# Patient Record
Sex: Female | Born: 1965 | Race: Black or African American | Hispanic: No | Smoking: Never smoker
Health system: Southern US, Community
[De-identification: ages and names within clinical notes are randomized; demographics above are authoritative.]

## PROBLEM LIST (undated history)

## (undated) DIAGNOSIS — D573 Sickle-cell trait: Secondary | ICD-10-CM

## (undated) DIAGNOSIS — E669 Obesity, unspecified: Secondary | ICD-10-CM

## (undated) DIAGNOSIS — M5416 Radiculopathy, lumbar region: Secondary | ICD-10-CM

## (undated) DIAGNOSIS — M1990 Unspecified osteoarthritis, unspecified site: Secondary | ICD-10-CM

## (undated) DIAGNOSIS — Z803 Family history of malignant neoplasm of breast: Secondary | ICD-10-CM

## (undated) DIAGNOSIS — M961 Postlaminectomy syndrome, not elsewhere classified: Secondary | ICD-10-CM

## (undated) DIAGNOSIS — Z8041 Family history of malignant neoplasm of ovary: Secondary | ICD-10-CM

## (undated) DIAGNOSIS — E119 Type 2 diabetes mellitus without complications: Secondary | ICD-10-CM

## (undated) DIAGNOSIS — Z800 Family history of malignant neoplasm of digestive organs: Secondary | ICD-10-CM

## (undated) DIAGNOSIS — K219 Gastro-esophageal reflux disease without esophagitis: Secondary | ICD-10-CM

## (undated) DIAGNOSIS — G894 Chronic pain syndrome: Secondary | ICD-10-CM

## (undated) DIAGNOSIS — Z973 Presence of spectacles and contact lenses: Secondary | ICD-10-CM

## (undated) DIAGNOSIS — F411 Generalized anxiety disorder: Secondary | ICD-10-CM

## (undated) DIAGNOSIS — I10 Essential (primary) hypertension: Secondary | ICD-10-CM

## (undated) DIAGNOSIS — M542 Cervicalgia: Secondary | ICD-10-CM

## (undated) HISTORY — PX: UMBILICAL HERNIA REPAIR: SHX196

## (undated) HISTORY — DX: Chronic pain syndrome: G89.4

## (undated) HISTORY — DX: Postlaminectomy syndrome, not elsewhere classified: M96.1

## (undated) HISTORY — DX: Cervicalgia: M54.2

## (undated) HISTORY — DX: Generalized anxiety disorder: F41.1

## (undated) HISTORY — DX: Type 2 diabetes mellitus without complications: E11.9

## (undated) HISTORY — PX: HERNIA REPAIR: SHX51

## (undated) HISTORY — DX: Radiculopathy, lumbar region: M54.16

## (undated) HISTORY — DX: Family history of malignant neoplasm of digestive organs: Z80.0

## (undated) HISTORY — DX: Family history of malignant neoplasm of ovary: Z80.41

## (undated) HISTORY — PX: DILATION AND CURETTAGE OF UTERUS: SHX78

## (undated) HISTORY — PX: SHOULDER ARTHROSCOPY W/ ROTATOR CUFF REPAIR: SHX2400

## (undated) HISTORY — DX: Family history of malignant neoplasm of breast: Z80.3

---
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
## 1989-06-09 HISTORY — PX: CHOLECYSTECTOMY: SHX55

## 1989-07-10 HISTORY — PX: TUBAL LIGATION: SHX77

## 1997-10-27 ENCOUNTER — Ambulatory Visit (HOSPITAL_COMMUNITY): Admission: RE | Admit: 1997-10-27 | Discharge: 1997-10-27 | Payer: Self-pay | Admitting: Internal Medicine

## 1998-06-13 ENCOUNTER — Emergency Department (HOSPITAL_COMMUNITY): Admission: EM | Admit: 1998-06-13 | Discharge: 1998-06-13 | Payer: Self-pay | Admitting: Internal Medicine

## 1998-09-01 ENCOUNTER — Emergency Department (HOSPITAL_COMMUNITY): Admission: EM | Admit: 1998-09-01 | Discharge: 1998-09-01 | Payer: Self-pay | Admitting: Emergency Medicine

## 1998-12-15 ENCOUNTER — Encounter: Payer: Self-pay | Admitting: Internal Medicine

## 1998-12-15 ENCOUNTER — Ambulatory Visit (HOSPITAL_COMMUNITY): Admission: RE | Admit: 1998-12-15 | Discharge: 1998-12-15 | Payer: Self-pay | Admitting: Internal Medicine

## 1999-02-14 ENCOUNTER — Emergency Department (HOSPITAL_COMMUNITY): Admission: EM | Admit: 1999-02-14 | Discharge: 1999-02-14 | Payer: Self-pay | Admitting: Internal Medicine

## 1999-02-24 ENCOUNTER — Encounter: Admission: RE | Admit: 1999-02-24 | Discharge: 1999-05-25 | Payer: Self-pay | Admitting: Family Medicine

## 1999-05-25 ENCOUNTER — Emergency Department (HOSPITAL_COMMUNITY): Admission: EM | Admit: 1999-05-25 | Discharge: 1999-05-25 | Payer: Self-pay | Admitting: *Deleted

## 1999-06-23 ENCOUNTER — Emergency Department (HOSPITAL_COMMUNITY): Admission: EM | Admit: 1999-06-23 | Discharge: 1999-06-23 | Payer: Self-pay | Admitting: Podiatry

## 1999-06-27 ENCOUNTER — Encounter: Admission: RE | Admit: 1999-06-27 | Discharge: 1999-06-27 | Payer: Self-pay | Admitting: Orthopedic Surgery

## 1999-06-27 ENCOUNTER — Encounter: Payer: Self-pay | Admitting: Orthopedic Surgery

## 1999-08-01 ENCOUNTER — Encounter: Payer: Self-pay | Admitting: Emergency Medicine

## 1999-08-01 ENCOUNTER — Emergency Department (HOSPITAL_COMMUNITY): Admission: EM | Admit: 1999-08-01 | Discharge: 1999-08-01 | Payer: Self-pay | Admitting: Emergency Medicine

## 1999-08-04 ENCOUNTER — Emergency Department (HOSPITAL_COMMUNITY): Admission: EM | Admit: 1999-08-04 | Discharge: 1999-08-04 | Payer: Self-pay | Admitting: Emergency Medicine

## 1999-11-11 ENCOUNTER — Encounter: Admission: RE | Admit: 1999-11-11 | Discharge: 1999-12-23 | Payer: Self-pay | Admitting: Orthopedic Surgery

## 1999-12-12 ENCOUNTER — Ambulatory Visit (HOSPITAL_BASED_OUTPATIENT_CLINIC_OR_DEPARTMENT_OTHER): Admission: RE | Admit: 1999-12-12 | Discharge: 1999-12-12 | Payer: Self-pay | Admitting: General Surgery

## 1999-12-12 ENCOUNTER — Encounter (INDEPENDENT_AMBULATORY_CARE_PROVIDER_SITE_OTHER): Payer: Self-pay | Admitting: *Deleted

## 2000-01-27 ENCOUNTER — Ambulatory Visit (HOSPITAL_COMMUNITY): Admission: RE | Admit: 2000-01-27 | Discharge: 2000-01-27 | Payer: Self-pay | Admitting: *Deleted

## 2000-07-23 ENCOUNTER — Emergency Department (HOSPITAL_COMMUNITY): Admission: EM | Admit: 2000-07-23 | Discharge: 2000-07-23 | Payer: Self-pay | Admitting: Emergency Medicine

## 2000-12-19 ENCOUNTER — Emergency Department (HOSPITAL_COMMUNITY): Admission: EM | Admit: 2000-12-19 | Discharge: 2000-12-19 | Payer: Self-pay

## 2001-12-04 ENCOUNTER — Emergency Department (HOSPITAL_COMMUNITY): Admission: EM | Admit: 2001-12-04 | Discharge: 2001-12-04 | Payer: Self-pay

## 2001-12-04 ENCOUNTER — Encounter: Payer: Self-pay | Admitting: Emergency Medicine

## 2001-12-31 ENCOUNTER — Other Ambulatory Visit: Admission: RE | Admit: 2001-12-31 | Discharge: 2001-12-31 | Payer: Self-pay | Admitting: Family Medicine

## 2002-02-17 ENCOUNTER — Encounter (HOSPITAL_BASED_OUTPATIENT_CLINIC_OR_DEPARTMENT_OTHER): Payer: Self-pay | Admitting: General Surgery

## 2002-02-20 ENCOUNTER — Ambulatory Visit (HOSPITAL_COMMUNITY): Admission: RE | Admit: 2002-02-20 | Discharge: 2002-02-20 | Payer: Self-pay | Admitting: General Surgery

## 2002-11-25 ENCOUNTER — Encounter: Payer: Self-pay | Admitting: Emergency Medicine

## 2002-11-25 ENCOUNTER — Emergency Department (HOSPITAL_COMMUNITY): Admission: EM | Admit: 2002-11-25 | Discharge: 2002-11-25 | Payer: Self-pay | Admitting: Emergency Medicine

## 2003-07-08 ENCOUNTER — Ambulatory Visit (HOSPITAL_COMMUNITY): Admission: RE | Admit: 2003-07-08 | Discharge: 2003-07-08 | Payer: Self-pay | Admitting: Internal Medicine

## 2003-08-11 HISTORY — PX: KNEE ARTHROSCOPY: SHX127

## 2003-08-18 ENCOUNTER — Ambulatory Visit (HOSPITAL_BASED_OUTPATIENT_CLINIC_OR_DEPARTMENT_OTHER): Admission: RE | Admit: 2003-08-18 | Discharge: 2003-08-18 | Payer: Self-pay | Admitting: Orthopaedic Surgery

## 2003-12-07 ENCOUNTER — Emergency Department (HOSPITAL_COMMUNITY): Admission: EM | Admit: 2003-12-07 | Discharge: 2003-12-08 | Payer: Self-pay | Admitting: Emergency Medicine

## 2003-12-09 ENCOUNTER — Emergency Department (HOSPITAL_COMMUNITY): Admission: EM | Admit: 2003-12-09 | Discharge: 2003-12-09 | Payer: Self-pay | Admitting: Family Medicine

## 2003-12-10 ENCOUNTER — Emergency Department (HOSPITAL_COMMUNITY): Admission: EM | Admit: 2003-12-10 | Discharge: 2003-12-10 | Payer: Self-pay | Admitting: Emergency Medicine

## 2004-03-02 ENCOUNTER — Emergency Department (HOSPITAL_COMMUNITY): Admission: EM | Admit: 2004-03-02 | Discharge: 2004-03-02 | Payer: Self-pay | Admitting: Emergency Medicine

## 2004-03-02 ENCOUNTER — Emergency Department (HOSPITAL_COMMUNITY): Admission: EM | Admit: 2004-03-02 | Discharge: 2004-03-02 | Payer: Self-pay | Admitting: Family Medicine

## 2005-02-23 ENCOUNTER — Emergency Department (HOSPITAL_COMMUNITY): Admission: EM | Admit: 2005-02-23 | Discharge: 2005-02-23 | Payer: Self-pay | Admitting: *Deleted

## 2005-03-27 ENCOUNTER — Emergency Department (HOSPITAL_COMMUNITY): Admission: EM | Admit: 2005-03-27 | Discharge: 2005-03-27 | Payer: Self-pay | Admitting: Emergency Medicine

## 2005-03-29 ENCOUNTER — Emergency Department (HOSPITAL_COMMUNITY): Admission: EM | Admit: 2005-03-29 | Discharge: 2005-03-29 | Payer: Self-pay | Admitting: Emergency Medicine

## 2005-04-24 ENCOUNTER — Emergency Department (HOSPITAL_COMMUNITY): Admission: EM | Admit: 2005-04-24 | Discharge: 2005-04-24 | Payer: Self-pay | Admitting: Emergency Medicine

## 2005-04-26 ENCOUNTER — Emergency Department (HOSPITAL_COMMUNITY): Admission: EM | Admit: 2005-04-26 | Discharge: 2005-04-26 | Payer: Self-pay | Admitting: Family Medicine

## 2005-04-27 ENCOUNTER — Ambulatory Visit (HOSPITAL_COMMUNITY): Admission: RE | Admit: 2005-04-27 | Discharge: 2005-04-27 | Payer: Self-pay | Admitting: Family Medicine

## 2005-12-05 ENCOUNTER — Emergency Department (HOSPITAL_COMMUNITY): Admission: EM | Admit: 2005-12-05 | Discharge: 2005-12-05 | Payer: Self-pay | Admitting: Family Medicine

## 2006-03-14 ENCOUNTER — Emergency Department (HOSPITAL_COMMUNITY): Admission: EM | Admit: 2006-03-14 | Discharge: 2006-03-14 | Payer: Self-pay | Admitting: Emergency Medicine

## 2006-05-24 ENCOUNTER — Encounter: Payer: Self-pay | Admitting: Cardiology

## 2006-05-24 ENCOUNTER — Ambulatory Visit: Payer: Self-pay

## 2006-12-22 ENCOUNTER — Emergency Department (HOSPITAL_COMMUNITY): Admission: EM | Admit: 2006-12-22 | Discharge: 2006-12-22 | Payer: Self-pay | Admitting: Family Medicine

## 2007-03-04 ENCOUNTER — Emergency Department (HOSPITAL_COMMUNITY): Admission: EM | Admit: 2007-03-04 | Discharge: 2007-03-04 | Payer: Self-pay | Admitting: Emergency Medicine

## 2007-04-15 ENCOUNTER — Encounter: Admission: RE | Admit: 2007-04-15 | Discharge: 2007-05-22 | Payer: Self-pay | Admitting: Orthopedic Surgery

## 2007-12-19 ENCOUNTER — Emergency Department (HOSPITAL_COMMUNITY): Admission: EM | Admit: 2007-12-19 | Discharge: 2007-12-19 | Payer: Self-pay | Admitting: Emergency Medicine

## 2008-04-13 ENCOUNTER — Emergency Department (HOSPITAL_COMMUNITY): Admission: EM | Admit: 2008-04-13 | Discharge: 2008-04-14 | Payer: Self-pay | Admitting: Emergency Medicine

## 2008-04-25 ENCOUNTER — Emergency Department (HOSPITAL_COMMUNITY): Admission: EM | Admit: 2008-04-25 | Discharge: 2008-04-25 | Payer: Self-pay | Admitting: Emergency Medicine

## 2008-06-12 ENCOUNTER — Emergency Department (HOSPITAL_COMMUNITY): Admission: EM | Admit: 2008-06-12 | Discharge: 2008-06-12 | Payer: Self-pay | Admitting: Family Medicine

## 2008-12-02 ENCOUNTER — Emergency Department (HOSPITAL_COMMUNITY): Admission: EM | Admit: 2008-12-02 | Discharge: 2008-12-02 | Payer: Self-pay | Admitting: Family Medicine

## 2009-06-24 ENCOUNTER — Emergency Department (HOSPITAL_COMMUNITY): Admission: EM | Admit: 2009-06-24 | Discharge: 2009-06-24 | Payer: Self-pay | Admitting: Family Medicine

## 2009-08-11 ENCOUNTER — Emergency Department (HOSPITAL_COMMUNITY): Admission: EM | Admit: 2009-08-11 | Discharge: 2009-08-11 | Payer: Self-pay | Admitting: Emergency Medicine

## 2010-06-07 ENCOUNTER — Emergency Department (HOSPITAL_COMMUNITY): Admission: EM | Admit: 2010-06-07 | Discharge: 2010-06-07 | Payer: Self-pay | Admitting: Family Medicine

## 2010-07-30 ENCOUNTER — Encounter: Payer: Self-pay | Admitting: Internal Medicine

## 2010-08-15 ENCOUNTER — Ambulatory Visit (INDEPENDENT_AMBULATORY_CARE_PROVIDER_SITE_OTHER): Payer: Self-pay

## 2010-08-15 ENCOUNTER — Inpatient Hospital Stay (INDEPENDENT_AMBULATORY_CARE_PROVIDER_SITE_OTHER)
Admission: RE | Admit: 2010-08-15 | Discharge: 2010-08-15 | Disposition: A | Discharge status: Home / Self Care | Payer: Self-pay | Source: Ambulatory Visit | Attending: Family Medicine | Admitting: Family Medicine

## 2010-08-15 DIAGNOSIS — S93609A Unspecified sprain of unspecified foot, initial encounter: Secondary | ICD-10-CM

## 2010-10-10 ENCOUNTER — Inpatient Hospital Stay (INDEPENDENT_AMBULATORY_CARE_PROVIDER_SITE_OTHER)
Admission: RE | Admit: 2010-10-10 | Discharge: 2010-10-10 | Disposition: A | Discharge status: Home / Self Care | Payer: Self-pay | Source: Ambulatory Visit | Attending: Emergency Medicine | Admitting: Emergency Medicine

## 2010-10-10 DIAGNOSIS — J040 Acute laryngitis: Secondary | ICD-10-CM

## 2010-10-10 DIAGNOSIS — J309 Allergic rhinitis, unspecified: Secondary | ICD-10-CM

## 2010-10-10 LAB — POCT RAPID STREP A (OFFICE): Streptococcus, Group A Screen (Direct): NEGATIVE

## 2010-11-25 NOTE — Op Note (Signed)
NAME:  [name], [name]                          ACCOUNT NO.:  [id number]   MEDICAL RECORD NO.:  [id number]                   PATIENT TYPE:  AMB   LOCATION:  DSC                                  FACILITY:  [facility]   PHYSICIAN:  Claude Manges. Cleophas Dunker, M.D.            DATE OF BIRTH:  [birth year]   DATE OF PROCEDURE:  08/18/2003  DATE OF DISCHARGE:                                 OPERATIVE REPORT   PREOPERATIVE DIAGNOSES:  1. 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  2. Possible intercondylar notch loose bodies.   POSTOPERATIVE DIAGNOSES:  1. 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  2. Possible intercondylar notch loose bodies without evidence of loose     bodies.  3. Chondromalacia of lateral compartment.   PROCEDURES:  1. Arthroscopic debridement of patella and lateral compartment.  2. Arthroscopic lateral release.   SURGEON:  Claude Manges. Cleophas Dunker, M.D.   ANESTHESIA:  General endotracheal anesthesia.   COMPLICATIONS:  None.   BRIEF HISTORY:  The patient is a 45 year old overweight female who has been  followed in the medical clinic with a history of patella subluxation and  possibly dislocation occurring approximately four years ago.  She has had  some sensation of her patella wanting to give way since that time.  She  has had a lot of difficulty with pain on the lateral compartment of her knee  as well as the lateral aspect of the patella.  A recent MRI scan revealed  grade 4 chondromalacia of the patella with possible calcified loose bodies  posterior to the posterior cruciate ligament.  There were no other visible  loose bodies in the joint and the menisci were intact.  Clinically, she has  subluxation with lateral pain.  She has not had arthroscopic evaluation.   DESCRIPTION OF PROCEDURE:  With the patient comfortable on the operating  table and under  general orotracheal anesthesia the left lower extremity was  placed in a thigh holder.  The leg was then prepped with DuraPrep from the  thigh holder to the ankle.  The patient did have a knee block  preoperatively.  Diagnostic arthroscopy was performed using a medial and  lateral parapatellar tendon stab wound.  Diagnostic arthroscopy revealed no  evidence of loose bodies in the superior pouch, either gutter or either  compartment.  I carefully probed the ACL and PCL which appeared to be intact  and did not feel or could I extract any loose bodies.  Both medial and  lateral menisci were intact.  I did not see any appreciable chondromalacia  of the medial compartment, but there was diffuse chondromalacia of the  lateral compartment, particularly grade 2 changes and close to grade 3  changes of the tibial plateau and probably grade 1 changes of the femoral  condyle.   There were areas of complete articular cartilage loss along the lateral  patellar facet with a lateral patellar tilt and accordingly a lateral  release was performed.  A guide spinal needle was placed at the proximal  extent of the superior compartment and pouch.  The arthroscopic long shafted  Bovie was then inserted and I carefully released the lateral superior extent  of the capsule to the inferior aspect of the joint line.  Because of the  patient's weight, there was considerable adipose tissue.  I carefully used  the ArthriCare wand so that I could remove a portion of this and visualize  the joint capsule more efficiently.  I had a very nice release.  I did  insert the scissors just to be sure that I had released all the fibers and I  felt that I had elevated the patella laterally and it was a very nice  release.   Any small bleeders were coagulated with the Bovie.  The joint was nice and  dry with no evidence of any loose material.  The two stab wounds were then  infiltrated with 0.25% Marcaine with epinephrine.  A  sterile bulky dressing  was applied followed by an ace bandage.   The plan is for [facility] for pain and follow-up in the office in one  week.                                               Claude Manges. Cleophas Dunker, M.D.    PWW/MEDQ  D:  08/18/2003  T:  08/18/2003  Job:  [id number]

## 2010-11-25 NOTE — Op Note (Signed)
NAME:  [name], BACKHAUS                          ACCOUNT NO.:  [id number]   MEDICAL RECORD NO.:  [id number]                   PATIENT TYPE:  OIB   LOCATION:  2550                                 FACILITY:  [facility]   PHYSICIAN:  Luisa Hart L. Lurene Shadow, M.D.             DATE OF BIRTH:  1965/09/21   DATE OF PROCEDURE:  02/20/2002  DATE OF DISCHARGE:  02/20/2002                                 OPERATIVE REPORT   PREOPERATIVE DIAGNOSIS:  Recurrent ventral hernia.   POSTOPERATIVE DIAGNOSIS:  Recurrent ventral hernia.   PROCEDURE:  Repair of recurrent ventral hernia.   SURGEON:  Mardene Celeste. Lurene Shadow, M.D.   ASSISTANT:  Nurse   ANESTHESIA:  General.   NOTE:  The patient is a 45 year old woman who is status post repair of a  periumbilical ventral hernia following a laparoscopic cholecystectomy in the  remote past.  She returns now with a bulging mass lateral to the previous  incision and adjacent to the umbilicus.  This mass is reducible.  She comes  to the operating room now for repair after the risks and potential benefits  of surgery have been fully discussed, all questions answered, and she gives  consent.   PROCEDURE:  Following the induction of satisfactory general anesthesia with  the patient positioned supine, the abdomen was routinely prepped and draped  to be included in the sterile operative field.  We made an infraumbilical  incision in a semicircular fashion excising the previous scar cicatrix.  This was deepened through the skin and subcutaneous tissue carrying the  dissection down to the region of the hernia.  The hernia was cleared of all  its attachments and the hernia sac opened and the peritoneum entered.  I  palpated the remainder of the abdominal wall, there were no additional  herniae palpated.  There was a select defect lateral and inferior to this  area within the region of my long finger but I decided that extending the  incision to close this hernia at this time would  not be appropriate.  The  peritoneum was then closed with a running suture of 2-0 Vicryl.  Sponge,  instrument and needle counts were verified.  The hernia defect was then  closed with a polypropylene mesh patch sewn in around the defect with  interrupted sutures of 0 Novafil.  By the end of this, the hernia defect was  noted to be intact.  Sponge, instrument and needle counts were again  verified.  The subcutaneous tissue were closed with interrupted 2-0 Vicryl  suture and the skin was closed with running 4-0 Monocryl suture reinforced  with Steri-Strips.  Sterile dressings were applied, the anesthetic reversed,  and the patient removed from the operating room to the recovery room in  stable condition, having tolerated the procedure well.  Mardene Celeste Lurene Shadow, M.D.    PLB/MEDQ  D:  02/20/2002  T:  02/21/2002  Job:  [id number]

## 2010-11-25 NOTE — Procedures (Signed)
[place]. [facility]  Patient:    [name], [name]                       MRN: [id number] Proc. Date: 01/27/00 Adm. Date:  [id number] Disc. Date: [id number] Attending:  Sharyn Dross CC:         Geraldo Pitter, M.D.                           Procedure Report  PREOPERATIVE DIAGNOSIS:  Persistent nausea and vomiting.  POSTOPERATIVE DIAGNOSIS: 1.  Diffuse gastritis. 2.  Relaxed lower esophageal sphincter. 3.  Bilious mucus leak, possibly secondary to status post cholecystectomy with     reflux of bilious material.  PROCEDURE:  Esophagogastroduodenoscopy.  MEDICATIONS:  Demerol 50 mg IV, Versed 5 mg IV ove ten minute period of time.  INSTRUMENT:  Olympus video panendoscope.  ENDOSCOPIST:  Sharyn Dross., M.D.  INDICATIONS:  This pleasant 45 year-old female was referred for an evaluation because of persistent nausea and vomiting that is occurring.  The patient is presently taking medications, such as Phenergan, for the nausea which is ongoing.  She is presently not on any other medications at this point.  She has had previous surgeries which were a hiatal hernia repair which was in June of 2001 and a cholecystectomy in December of 1990.  She is presently stable bt was referred because of the persistent nausea and vomiting and discomfort that is present.  Objective findings is that she is a pleasant female, obese, in no acute distress.  Her vital signs are stable.  HEENT:  Anicteric.  Neck supple. Lungs clear.  Heart:  Regular rate and rhythm.  No murmurs, rubs, or gallops. Abdomen:  Soft.  No major tenderness to palpation.  No hepatosplenomegaly appreciated.  PLAN:  Proceed with endoscopic examination.  PROCEDURE IN DETAIL:  The patient was advised of the procedure, indications, nd risks involved.  The patient has agreed to have the procedure performed at this time.  A video was viewed and informed consent was obtained.  The patient was brought into  the endoscopy unit and IV sedative medications were started.  Monitors were placed on the patient to monitor the patients vital signs and oxygen saturation.  His nasal oxygen at 2 liters per minute was used and, after adequate sedation was performed, the procedure was begun.  The instrument was advanced with the patient lying in the left lateral position via direct visualization without difficulty.  The oropharynx, epiglottis, vocal cords, and piriform sinuses appeared to be grossly within normal limits.  The esophagus was normal without any evidence of acute inflammation, ulceration, hiatal hernia, or varices appreciated.  The distal portion of the esophagus showed evidence of some possibly mild irritation of the cardia region at this time but no gross abnormalities noted.  The gastric area showed evidence of a bilious mucus leak with evidence of diffuse gastritis that was present throughout the gastric body and antral area.  This inflammation appears to be slightly different from the normal characteristics as previously seen at this time.  Photographs were taken of this region which had a nutmeg appearance at this point.  The antral area showed evidence of inflammation that was noted in the pylorus.  It was normal at this time.  Upon advancing into the pylorus and duodenal bulb and second portion, these appeared to be within normal limits.  The instrument was retracted  back where retroflexion of the cardia revealed a relaxed lower esophageal sphincter, possibly secondary to the medications given.  The instrument was retracted back where visualization of the gastric diverticulum was noted.  There appeared to be no abnormalities within the diverticulum at this point. Photographs were taken of the gastric diverticulum that was noted.  The Z-line appeared to be approximately 37.5 cm distal to the esophagus and, as the instrument was retracted back into the esophageal region, a better  photograph of the borders of the area was taken.  Again, this was still consistent possibly with a mild inflammatory process that could be present at this time. The instrument was retracted back into the esophageal region where there was no evidence of any pulse endoscopic trauma to the mucosa.  The instrument was subsequently removed without difficulty with the patient tolerating the proceduregarding well.  TREATMENT:  I am going to start the patient with Reglan 10 mg q.12h in conjunction with the medication she is presently taking.  I am going to see, because of the increased bowel reflux that is present, could she had bowel gastritis which may be a contributing factor to her problems.  Will have her followup in the office in the next few weeks and depending upon her response will determine the course of therapy. DD:  01/27/00 TD:  01/30/00 Job: [id number]

## 2010-11-25 NOTE — Op Note (Signed)
[place]. [facility]  Patient:    [name], [name]                       MRN: [id number] Proc. Date: 12/12/99 Adm. Date:  [id number] Attending:  Fortino Sic                           Operative Report  PREOPERATIVE DIAGNOSIS:  Ventral hernia.  POSTOPERATIVE DIAGNOSIS:  Ventral hernia.  OPERATION PERFORMED:  Repair of ventral hernia.  SURGEON:  Marnee Spring. Wiliam Ke, M.D.  ASSISTANT:  None.  ANESTHESIA:  Endotracheal by hospital.  PROCEDURE:  Under good endotracheal anesthesia, skin of the abdomen was prepped and draped in usual manner.  The previously used subumbilical incision was opened and extended to the left.  The patient was quite obese, but we were able to get to the abdominal wall and find a hernia approximately one fingerbreadth across.  The hernia sac was excised.  A finger was placed inside the preperitoneal space and no other hernias were found by palpation. Hemostasis was good.  The hernia defect was closed with three musculofascial sutures of #1 Novofil.  The defect was then closed with subcutaneous 3-0 Vicryl and subcuticular 4-0 Dexon.  Steri-Strips were applied.  Estimated blood loss minimal.  The patient received no blood and left the operating room in satisfactory condition after sponge and needle counts were verified. DD:  12/12/99 TD:  12/14/99 Job: [id number]

## 2011-02-21 ENCOUNTER — Inpatient Hospital Stay (INDEPENDENT_AMBULATORY_CARE_PROVIDER_SITE_OTHER)
Admission: RE | Admit: 2011-02-21 | Discharge: 2011-02-21 | Disposition: A | Discharge status: Home / Self Care | Payer: Self-pay | Source: Ambulatory Visit | Attending: Emergency Medicine | Admitting: Emergency Medicine

## 2011-02-21 DIAGNOSIS — J45909 Unspecified asthma, uncomplicated: Secondary | ICD-10-CM

## 2011-02-21 DIAGNOSIS — J019 Acute sinusitis, unspecified: Secondary | ICD-10-CM

## 2011-03-30 ENCOUNTER — Inpatient Hospital Stay (INDEPENDENT_AMBULATORY_CARE_PROVIDER_SITE_OTHER)
Admission: RE | Admit: 2011-03-30 | Discharge: 2011-03-30 | Disposition: A | Discharge status: Home / Self Care | Payer: Self-pay | Source: Ambulatory Visit | Attending: Emergency Medicine | Admitting: Emergency Medicine

## 2011-03-30 DIAGNOSIS — M79609 Pain in unspecified limb: Secondary | ICD-10-CM

## 2011-03-30 DIAGNOSIS — M779 Enthesopathy, unspecified: Secondary | ICD-10-CM

## 2011-08-08 ENCOUNTER — Emergency Department (INDEPENDENT_AMBULATORY_CARE_PROVIDER_SITE_OTHER): Payer: Self-pay

## 2011-08-08 ENCOUNTER — Emergency Department (INDEPENDENT_AMBULATORY_CARE_PROVIDER_SITE_OTHER)
Admission: EM | Admit: 2011-08-08 | Discharge: 2011-08-08 | Disposition: A | Discharge status: Home / Self Care | Payer: Self-pay | Source: Home / Self Care | Attending: Family Medicine | Admitting: Family Medicine

## 2011-08-08 ENCOUNTER — Encounter (HOSPITAL_COMMUNITY): Payer: Self-pay | Admitting: Emergency Medicine

## 2011-08-08 DIAGNOSIS — S63509A Unspecified sprain of unspecified wrist, initial encounter: Secondary | ICD-10-CM

## 2011-08-08 DIAGNOSIS — S63501A Unspecified sprain of right wrist, initial encounter: Secondary | ICD-10-CM

## 2011-08-08 NOTE — ED Notes (Signed)
Reports falling on steps this am, reports "slick" spot on steps.  Patient tried to catch self, hyperextended right wrist/hand.  Able to move fingers and palpable radial pulse in right wrist.  Points to wrist as most painful area

## 2011-08-08 NOTE — ED Provider Notes (Signed)
History     CSN: [id number]  Arrival date & time 08/08/11  8657   First MD Initiated Contact with Patient 08/08/11 [phone]      Chief Complaint  Patient presents with  . Fall    (Consider location/radiation/quality/duration/timing/severity/associated sxs/prior treatment) Patient is a 46 y.o. female presenting with wrist pain. The history is provided by the patient.  Wrist Pain This is a new problem. The current episode started 1 to 2 hours ago (fell down stair and landed on right wrist). The problem occurs constantly. The problem has not changed since onset.   Past Medical History  Diagnosis Date  . Asthma     Past Surgical History  Procedure Date  . Tubal ligation   . Cholecystectomy   . Hernia repair   . Rotator cuff repair     History reviewed. No pertinent family history.  History  Substance Use Topics  . Smoking status: Never Smoker   . Smokeless tobacco: Not on file  . Alcohol Use: No    OB History    Grav Para Term Preterm Abortions TAB SAB Ect Mult Living                  Review of Systems  Constitutional: Negative.   Musculoskeletal: Positive for joint swelling. Negative for back pain and gait problem.  Neurological: Negative.     Allergies  Vicodin  Home Medications  No current outpatient prescriptions on file.  BP 139/94  Pulse 82  Temp(Src) 97.3 F (36.3 C) (Oral)  Resp 16  SpO2 96%  LMP 07/09/2011  Physical Exam  Nursing note and vitals reviewed. Constitutional: She is oriented to person, place, and time. She appears well-developed and well-nourished.  HENT:  Head: Normocephalic and atraumatic.  Musculoskeletal: She exhibits tenderness.       Right wrist: She exhibits decreased range of motion, tenderness, bony tenderness and swelling. She exhibits no effusion, no deformity and no laceration.       No other c/o injury except right wrist.  Neurological: She is alert and oriented to person, place, and time.    ED Course    Procedures (including critical care time)  Labs Reviewed - No data to display Dg Wrist Complete Right  08/08/2011  *RADIOLOGY REPORT*  Clinical Data:  Fall, hand pain  RIGHT WRIST - COMPLETE 3+ VIEW  Comparison: None.  Findings: No evidence of fracture distal radius or ulna. Radiocarpal joint is intact.  No evidence of carpal fracture.  IMPRESSION: No radiographic evidence of wrist fracture.  Original Report Authenticated By: Genevive Bi, M.D.     1. Sprain of right wrist       MDM  X-rays reviewed and report per radiologist.        Barkley Bruns, MD 08/08/11 1026

## 2011-08-15 ENCOUNTER — Encounter (HOSPITAL_COMMUNITY): Payer: Self-pay | Admitting: Emergency Medicine

## 2011-08-15 ENCOUNTER — Emergency Department (HOSPITAL_COMMUNITY)
Admission: EM | Admit: 2011-08-15 | Discharge: 2011-08-15 | Disposition: A | Discharge status: Home / Self Care | Payer: Self-pay | Attending: Emergency Medicine | Admitting: Emergency Medicine

## 2011-08-15 DIAGNOSIS — G43909 Migraine, unspecified, not intractable, without status migrainosus: Secondary | ICD-10-CM | POA: Insufficient documentation

## 2011-08-15 MED ORDER — DEXAMETHASONE SODIUM PHOSPHATE 10 MG/ML IJ SOLN
10.0000 mg | Freq: Once | INTRAMUSCULAR | Status: AC
  Administered 2011-08-15: 10 mg via INTRAMUSCULAR
  Filled 2011-08-15: qty 1

## 2011-08-15 MED ORDER — PROMETHAZINE HCL 25 MG/ML IJ SOLN
25.0000 mg | Freq: Four times a day (QID) | INTRAMUSCULAR | Status: DC | PRN
  Administered 2011-08-15: 25 mg via INTRAMUSCULAR
  Filled 2011-08-15: qty 1

## 2011-08-15 MED ORDER — KETOROLAC TROMETHAMINE 60 MG/2ML IM SOLN
INTRAMUSCULAR | Status: AC
  Filled 2011-08-15: qty 2

## 2011-08-15 MED ORDER — KETOROLAC TROMETHAMINE 30 MG/ML IJ SOLN
60.0000 mg | Freq: Once | INTRAMUSCULAR | Status: AC
  Administered 2011-08-15: 60 mg via INTRAMUSCULAR
  Filled 2011-08-15: qty 1

## 2011-08-15 MED ORDER — DIPHENHYDRAMINE HCL 50 MG/ML IJ SOLN
25.0000 mg | Freq: Once | INTRAMUSCULAR | Status: AC
  Administered 2011-08-15: 16:00:00 via INTRAMUSCULAR
  Filled 2011-08-15: qty 1

## 2011-08-15 NOTE — ED Provider Notes (Signed)
History     CSN: [id number]  Arrival date & time 08/15/11  1505   First MD Initiated Contact with Patient 08/15/11 1511      Chief Complaint  Patient presents with  . Migraine    (Consider location/radiation/quality/duration/timing/severity/associated sxs/prior treatment) HPI Comments: Patient reports she is having symptoms of her typical migraine that began yesterday morning.  Reports headache across forehead that is throbbing with sharp pains over her left parietal region.  Has associated blurry vision and nausea.  States there is no change in her chronic migraine symptoms.  Has taken Excedrin migraine without relief.  Patient also began developing upper respiratory symptoms yesterday with nasal congestion and sinus pressure.  Last headache this bad was 3 years ago.  Denies fevers, stiff neck, "worst" headache of her life, sudden onset of headache, focal neurological deficits.  Previously treated at pain clinic with toradol for chronic migraines but no longer goes.    Patient is a 46 y.o. female presenting with migraine. The history is provided by the patient.  Migraine Associated symptoms include headaches. Pertinent negatives include no chest pain, fever, neck pain, numbness or sore throat.    Past Medical History  Diagnosis Date  . Asthma     Past Surgical History  Procedure Date  . Tubal ligation   . Cholecystectomy   . Hernia repair   . Rotator cuff repair     No family history on file.  History  Substance Use Topics  . Smoking status: Never Smoker   . Smokeless tobacco: Not on file  . Alcohol Use: No    OB History    Grav Para Term Preterm Abortions TAB SAB Ect Mult Living                  Review of Systems  Constitutional: Negative for fever.  HENT: Negative for sore throat, neck pain and neck stiffness.   Respiratory: Negative for shortness of breath.   Cardiovascular: Negative for chest pain.  Neurological: Positive for headaches. Negative for  dizziness and numbness.  All other systems reviewed and are negative.    Allergies  Vicodin  Home Medications  No current outpatient prescriptions on file.  BP 142/93  Pulse 80  Temp(Src) 98 F (36.7 C) (Oral)  Resp 14  Ht 5' 6.5" (1.689 m)  Wt 302 lb (136.986 kg)  BMI 48.01 kg/m2  SpO2 99%  LMP 08/14/2010  Physical Exam  Nursing note and vitals reviewed. Constitutional: She is oriented to person, place, and time. She appears well-developed and well-nourished.  HENT:  Head: Normocephalic and atraumatic.  Neck: Neck supple.  Cardiovascular: Normal rate, regular rhythm and normal heart sounds.   Pulmonary/Chest: Breath sounds normal. No stridor. No respiratory distress. She has no wheezes. She has no rales. She exhibits no tenderness.  Abdominal: Soft. Bowel sounds are normal. There is no tenderness.  Neurological: She is alert and oriented to person, place, and time. She has normal strength. No cranial nerve deficit or sensory deficit. She exhibits normal muscle tone. GCS eye subscore is 4. GCS verbal subscore is 5. GCS motor subscore is 6.       CN III-XII intact, EOMs intact, grip strengths equal, strength 5/5 in all extremities, sensation intact.      ED Course  Procedures (including critical care time)  Labs Reviewed - No data to display No results found.   1. Migraine       MDM  Nontoxic patient with hx migraines reports 2 days  of chronic migraine symptoms unrelieved with home medications.  No concerning symptoms suggesting that this is not a simple migraine.  Treated in ED with IM medications.  D/C home with headache [facility] center and PCP follow up.          Dillard [place], [place] 08/15/11 1910

## 2011-08-15 NOTE — ED Notes (Signed)
Pt states woke up with migraine yesterday am, has used Excederin migraine this am at 11 without relief. Denies vomiting but has had bouts of nausea. Pt states last migraine prior to this one was 1 year. Pt states does not have a neuro to see.

## 2011-08-15 NOTE — ED Provider Notes (Signed)
Medical screening examination/treatment/procedure(s) were performed by non-physician practitioner and as supervising physician I was immediately available for consultation/collaboration.  Daily Doe P Eriel Doyon, MD 08/15/11 2343 

## 2011-09-01 ENCOUNTER — Emergency Department (HOSPITAL_COMMUNITY)
Admission: EM | Admit: 2011-09-01 | Discharge: 2011-09-01 | Disposition: A | Discharge status: Home / Self Care | Payer: Self-pay | Attending: Emergency Medicine | Admitting: Emergency Medicine

## 2011-09-01 ENCOUNTER — Encounter (HOSPITAL_COMMUNITY): Payer: Self-pay

## 2011-09-01 DIAGNOSIS — G43909 Migraine, unspecified, not intractable, without status migrainosus: Secondary | ICD-10-CM | POA: Insufficient documentation

## 2011-09-01 DIAGNOSIS — J45909 Unspecified asthma, uncomplicated: Secondary | ICD-10-CM | POA: Insufficient documentation

## 2011-09-01 MED ORDER — DIPHENHYDRAMINE HCL 50 MG/ML IJ SOLN
25.0000 mg | Freq: Once | INTRAMUSCULAR | Status: AC
  Administered 2011-09-01: 25 mg via INTRAVENOUS
  Filled 2011-09-01: qty 1

## 2011-09-01 MED ORDER — DEXAMETHASONE SODIUM PHOSPHATE 10 MG/ML IJ SOLN
10.0000 mg | Freq: Once | INTRAMUSCULAR | Status: AC
  Administered 2011-09-01: 10 mg via INTRAVENOUS
  Filled 2011-09-01: qty 1

## 2011-09-01 MED ORDER — KETOROLAC TROMETHAMINE 30 MG/ML IJ SOLN
30.0000 mg | Freq: Once | INTRAMUSCULAR | Status: AC
  Administered 2011-09-01: 30 mg via INTRAMUSCULAR
  Filled 2011-09-01: qty 1

## 2011-09-01 NOTE — ED Provider Notes (Signed)
History     CSN: [id number]  Arrival date & time 08/15/11  1505   First MD Initiated Contact with Patient 08/15/11 1511      Chief Complaint  Patient presents with  . Migraine    (Consider location/radiation/quality/duration/timing/severity/associated sxs/prior treatment) HPI   Patient reports she is having symptoms of her typical migraine that began yesterday Wednesday. Reports headache across forehead that is throbbing with sharp pains over her left parietal region. Has associated blurry vision and nausea. States there is no change in her chronic migraine symptoms. Has taken Excedrin migraine without relief. Patient also began developing upper respiratory symptoms yesterday with nasal congestion and sinus pressure. Last headache this bad was 3 years ago. Denies fevers, stiff neck, "worst" headache of her life, sudden onset of headache, focal neurological deficits. Previously treated at pain clinic with toradol for chronic migraines but no longer goes.  The patient admits to being seen here on Feb [facility] at [facility] and given toradol, decadron and benadryl which helped. She was not given any meds for home. SHe was given a referral to the [facility] which she calls and they will see her Monday. She states she can't wait that long. 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   Past Medical History  Diagnosis Date  . Asthma   . Migraine     Past Surgical History  Procedure Date  . Tubal ligation   . Cholecystectomy   . Hernia repair   . Rotator cuff repair     No family history on file.  History  Substance Use Topics  . Smoking status: Never Smoker   . Smokeless tobacco: Not on file  . Alcohol Use: No    OB History    Grav Para Term Preterm Abortions TAB SAB Ect Mult Living                  Review of Systems  All other systems reviewed and are negative.    Allergies    Vicodin  Home Medications   Current Outpatient Rx  Name Route Sig Dispense Refill  . ASPIRIN-ACETAMINOPHEN-CAFFEINE 250-250-65 MG PO TABS Oral Take 1 tablet by mouth every 6 (six) hours as needed. For migraine      BP 149/89  Pulse 84  Temp(Src) 98 F (36.7 C) (Oral)  Resp 14  Ht 5' 6.5" (1.689 m)  Wt 302 lb (136.986 kg)  BMI 48.01 kg/m2  SpO2 100%  LMP 08/14/2010  Physical Exam  Constitutional: She is oriented to person, place, and time. She appears well-developed and well-nourished.  HENT:  Head: Normocephalic and atraumatic.  Eyes: Conjunctivae and EOM are normal. Pupils are equal, round, and reactive to light.  Neck: Trachea normal, normal range of motion and full passive range of motion without pain. Neck supple.  Cardiovascular: Normal rate, regular rhythm and normal pulses.   Pulmonary/Chest: Effort normal. Chest wall is not dull to percussion. She exhibits no crepitus, no edema, no deformity and no retraction.  Abdominal: Normal appearance.  Musculoskeletal: Normal range of motion.  Neurological: She is oriented to person, place, and time. She has normal strength. No cranial nerve deficit. Coordination normal. GCS eye subscore is 4. GCS verbal subscore is 5. GCS motor subscore is 6.  Skin: Skin is warm, dry and intact.  Psychiatric: Her speech is normal. Cognition and memory are normal.    ED  Course  Procedures (including critical care time)  Labs Reviewed - No data to display No results found.   1. Migraine       MDM  Pt given Toradol, Decadron and Benadryl IM in  ED. NO Rx for home, she can use her Excendrin. Pt to follow-up with Headache clinic on Monday.         Dorthula Matas, PA 09/02/11 [phone]

## 2011-09-01 NOTE — ED Notes (Signed)
No neurologist however has referral to headache clinic.  Use to see Adeleman for her headache

## 2011-09-01 NOTE — ED Provider Notes (Signed)
History     CSN: [id number]  Arrival date & time 09/01/11  1507   First MD Initiated Contact with Patient 09/01/11 1536      No chief complaint on file.   (Consider location/radiation/quality/duration/timing/severity/associated sxs/prior treatment) Patient is a 46 y.o. female presenting with migraine.  Migraine This is a recurrent problem.   Patient reports she is having symptoms of her typical migraine that began Wednesday. Reports headache across forehead that is throbbing with sharp pains over her left parietal region. Has associated blurry vision and nausea. States there is no change in her chronic migraine symptoms. Has taken Excedrin migraine without relief. Denies fevers, stiff neck, "worst" headache of her life, sudden onset of headache, focal neurological deficits. Previously treated at pain clinic with toradol for chronic migraines but no longer goes.  Pt admits to being seen on Aug 15, 2011 for migraine headache which was treated in the ED. PT given referral to [facility] but can not be seen until Monday. Pt states at work they were spraying something, she had angry clients and two tests which caused her mirgraine to return.   Past Medical History  Diagnosis Date  . Asthma     Past Surgical History  Procedure Date  . Tubal ligation   . Cholecystectomy   . Hernia repair   . Rotator cuff repair     No family history on file.  History  Substance Use Topics  . Smoking status: Never Smoker   . Smokeless tobacco: Not on file  . Alcohol Use: No    OB History    Grav Para Term Preterm Abortions TAB SAB Ect Mult Living                  Review of Systems  All other systems reviewed and are negative.    Allergies  Vicodin  Home Medications   Current Outpatient Rx  Name Route Sig Dispense Refill  . ASPIRIN-ACETAMINOPHEN-CAFFEINE 250-250-65 MG PO TABS Oral Take 1 tablet by mouth every 6 (six) hours as needed. For migraine      LMP 08/14/2010  Physical Exam   Constitutional: She is oriented to person, place, and time. She appears well-developed and well-nourished.  HENT:  Head: Normocephalic and atraumatic.  Eyes: Conjunctivae are normal. Pupils are equal, round, and reactive to light.  Neck: Trachea normal, normal range of motion and full passive range of motion without pain. Neck supple.  Cardiovascular: Normal rate, regular rhythm and normal pulses.   Pulmonary/Chest: Effort normal. Chest wall is not dull to percussion. She exhibits no crepitus, no edema, no deformity and no retraction.  Abdominal: Normal appearance.  Musculoskeletal: Normal range of motion.  Neurological: She is oriented to person, place, and time. She has normal strength. No cranial nerve deficit. Coordination normal. GCS eye subscore is 4. GCS verbal subscore is 5. GCS motor subscore is 6.  Skin: Skin is warm, dry and intact.  Psychiatric: Her speech is normal. Cognition and memory are normal.    ED Course  Procedures (including critical care time)  Labs Reviewed - No data to display No results found.   1. Migraine       MDM  Pt given IM benadryl, Toradol and Decadron in ED. Pt to see [facility] on Monday. She can continue to use Excedrin at home for pain. Return to ED precautions given        Dorthula Matas, [place] 09/01/11 1633

## 2011-09-01 NOTE — ED Notes (Signed)
Waiting on ride.

## 2011-09-01 NOTE — Discharge Instructions (Signed)
Migraine Headache A migraine headache is an intense, throbbing pain on one or both sides of your head. The exact cause of a migraine headache is not always known. A migraine may be caused when nerves in the brain become irritated and release chemicals that cause swelling within blood vessels, causing pain. Many migraine sufferers have a family history of migraines. Before you get a migraine you may or may not get an aura. An aura is a group of symptoms that can predict the beginning of a migraine. An aura may include:  Visual changes such as:   Flashing lights.   Bright spots or zig-zag lines.   Tunnel vision.   Feelings of numbness.   Trouble talking.   Muscle weakness.  SYMPTOMS  Pain on one or both sides of your head.   Pain that is pulsating or throbbing in nature.   Pain that is severe enough to prevent daily activities.   Pain that is aggravated by any daily physical activity.   Nausea (feeling sick to your stomach), vomiting, or both.   Pain with exposure to bright lights, loud noises, or activity.   General sensitivity to bright lights or loud noises.  MIGRAINE TRIGGERS Examples of triggers of migraine headaches include:   Alcohol.   Smoking.   Stress.   It may be related to menses (female menstruation).   Aged cheeses.   Foods or drinks that contain nitrates, glutamate, aspartame, or tyramine.   Lack of sleep.   Chocolate.   Caffeine.   Hunger.   Medications such as nitroglycerine (used to treat chest pain), birth control pills, estrogen, and some blood pressure medications.  DIAGNOSIS  A migraine headache is often diagnosed based on:  Symptoms.   Physical examination.   A computerized X-ray scan (computed tomography, CT) of your head.  TREATMENT  Medications can help prevent migraines if they are recurrent or should they become recurrent. Your caregiver can help you with a medication or treatment program that will be helpful to you.   Lying  down in a dark, quiet room may be helpful.   Keeping a headache diary may help you find a trend as to what may be triggering your headaches.  SEEK IMMEDIATE MEDICAL CARE IF:   You have confusion, personality changes or seizures.   You have headaches that wake you from sleep.   You have an increased frequency in your headaches.   You have a stiff neck.   You have a loss of vision.   You have muscle weakness.   You start losing your balance or have trouble walking.   You feel faint or pass out.  MAKE SURE YOU:   Understand these instructions.   Will watch your condition.   Will get help right away if you are not doing well or get worse.  Document Released: 06/26/2005 Document Revised: 03/08/2011 Document Reviewed: 02/09/2009 [facility] Patient Information 2012 [place], [place].  RESOURCE GUIDE  Dental Problems  Patients with Medicaid: [facility] [phone] [address]                                           [phone] [address] Phone:  [phone]  Phone:  [phone]  If unable to pay or uninsured, contact:  [facility] or [facility]. to become qualified for the adult dental clinic.  Chronic Pain Problems Contact [facility]  [phone] Patients need to be referred by their primary care doctor.  Insufficient Money for Medicine Contact [employer] Way:  call "211" or [facility] 8591741712.  No Primary Care Doctor Call [facility]  [phone] Other agencies that provide inexpensive medical care    [facility]  [phone]    [facility]  [phone]    [facility]  [phone]    [facility]  [phone]    [facility]  [phone]    [facility]  [phone]  Psychological Services [facility] Behavioral Health  [phone] [facility] Services  [phone] [facility] Mental [facility]   [phone]  (emergency services [phone])  Substance Abuse Resources [facility]  [phone] [facility] [phone] [facility] [place] [phone] Floydene Flock [phone] Residential & Outpatient Substance Abuse Program  [phone]  Abuse/Neglect [facility] Child Abuse Hotline 807 602 2013 [facility] Child Abuse Hotline (571)835-7950 (After Hours)  Emergency Shelter [facility] Ministries 773-427-4127  Maternity Homes [facility] [phone] [facility] Alert Services [phone]  MRSA Hotline #:   7624084730    [facility] [place] Resources  [facility] [place] [facility]     [employer]                          [facility] Dept. [address] [place]                       [address]      [address]  Blondell Reveal Phone:  [phone]                                   Phone:  [phone]                 Phone:  [phone]  [facility] Mental Health Phone:  [phone]  [facility] Child Abuse Hotline [phone] (After Hours)

## 2011-09-01 NOTE — ED Notes (Signed)
Typical migraine since Wednesday- excedrin migraine with no relief.  Seen a few weeks ago and was given shot "great relief"- Pt has a ride

## 2011-09-02 NOTE — ED Provider Notes (Signed)
Medical screening examination/treatment/procedure(s) were performed by non-physician practitioner and as supervising physician I was immediately available for consultation/collaboration.  Vershawn Westrup T Giana Castner, MD 09/02/11 2027 

## 2011-09-02 NOTE — ED Provider Notes (Signed)
Medical screening examination/treatment/procedure(s) were performed by non-physician practitioner and as supervising physician I was immediately available for consultation/collaboration.  Zaevion Parke T Hamda Klutts, MD 09/02/11 2027 

## 2011-09-13 ENCOUNTER — Emergency Department (HOSPITAL_COMMUNITY)
Admission: EM | Admit: 2011-09-13 | Discharge: 2011-09-13 | Disposition: A | Discharge status: Home / Self Care | Payer: Self-pay | Attending: Emergency Medicine | Admitting: Emergency Medicine

## 2011-09-13 ENCOUNTER — Encounter (HOSPITAL_COMMUNITY): Payer: Self-pay | Admitting: Emergency Medicine

## 2011-09-13 DIAGNOSIS — G43909 Migraine, unspecified, not intractable, without status migrainosus: Secondary | ICD-10-CM | POA: Insufficient documentation

## 2011-09-13 MED ORDER — KETOROLAC TROMETHAMINE 30 MG/ML IJ SOLN
30.0000 mg | Freq: Once | INTRAMUSCULAR | Status: AC
  Administered 2011-09-13: 30 mg via INTRAVENOUS
  Filled 2011-09-13: qty 1

## 2011-09-13 MED ORDER — METOCLOPRAMIDE HCL 10 MG PO TABS: 10.0000 mg | ORAL_TABLET | Freq: Four times a day (QID) | ORAL | Status: DC | PRN

## 2011-09-13 MED ORDER — KETOROLAC TROMETHAMINE 10 MG PO TABS: 10.0000 mg | ORAL_TABLET | Freq: Four times a day (QID) | ORAL | Status: AC | PRN

## 2011-09-13 MED ORDER — METOCLOPRAMIDE HCL 5 MG/ML IJ SOLN
10.0000 mg | Freq: Once | INTRAMUSCULAR | Status: AC
  Administered 2011-09-13: 10 mg via INTRAVENOUS
  Filled 2011-09-13: qty 2

## 2011-09-13 MED ORDER — DEXAMETHASONE SODIUM PHOSPHATE 10 MG/ML IJ SOLN
10.0000 mg | Freq: Once | INTRAMUSCULAR | Status: AC
  Administered 2011-09-13: 10 mg via INTRAVENOUS
  Filled 2011-09-13: qty 1

## 2011-09-13 MED ORDER — SODIUM CHLORIDE 0.9 % IV BOLUS (SEPSIS)
1000.0000 mL | Freq: Once | INTRAVENOUS | Status: AC
  Administered 2011-09-13: 1000 mL via INTRAVENOUS

## 2011-09-13 NOTE — Discharge Instructions (Signed)
As we discussed, do not use more than 2 doses of toradol at home in 1 day and do not take for more than 2 days at a time. You can use the reglan for nausea associated with your migraines. Return to the ER if your headache is different from your normal or is associated with visual change, fever, neck stiffness, rash, or trouble walking or speaking.     Recurrent Migraine Headache You have a recurrent migraine headache. The caregiver can usually provide good relief for this headache. If this headache is the same as your previous migraine headaches, it is safe to treat you without repeating a complete evaluation.  These headaches usually have at least two of the following problems:   They occur on one side of the head, pulsate, and are severe enough to prevent daily activities.   They are aggravated by daily physical activities.  You may have one or more of the following symptoms:   Nausea (feeling sick to your stomach).   Vomiting.   Pain with exposure to bright lights or loud noises.  Most headache sufferers have a family history of migraines. Your headaches may also be related to alcohol and smoking habits. Too much sleep, too little sleep, mood, and anxiety may also play a part. Changing some of these triggers may help you lower the number and level of pain of the headaches. Headaches may be related to menses (female menstruation). There are numerous medications that can prevent these headaches. Your caregiver can help you with a medication or regimen (procedure to follow). If this has been a chronic (long-term) condition, the use of long-term narcotics is not recommended. Using long-term narcotics can cause recurrent migraines. Narcotics are only a temporary measure only. They are used for the infrequent migraine that fails to respond to all other measures. SEEK MEDICAL CARE IF:   You do not get relief from the medications given to you.   You have a recurrence of pain.   This headache  begins to differ from past migraine (for example if it is more severe).  SEEK IMMEDIATE MEDICAL CARE IF:  You have a fever.   You have a stiff neck.   You have vision loss or have changes in vision.   You have problems with feeling lightheaded, become faint, or lose your balance.   You have muscular weakness.   You have loss of muscular control.   You develop severe symptoms different from your first symptoms.   You start losing your balance or have trouble walking.   You feel faint or pass out.  MAKE SURE YOU:   Understand these instructions.   Will watch your condition.   Will get help right away if you are not doing well or get worse.  Document Released: 03/21/2001 Document Revised: 06/15/2011 Document Reviewed: 02/13/2008 [facility] Patient Information 2012 [place], [place].

## 2011-09-13 NOTE — ED Provider Notes (Signed)
History     CSN: [id number]  Arrival date & time 09/13/11  1409   First MD Initiated Contact with Patient 09/13/11 1431      Chief Complaint  Patient presents with  . Migraine    (Consider location/radiation/quality/duration/timing/severity/associated sxs/prior treatment) The history is provided by the patient.  Pt with a hx of migraine HA presents to ED with c/o migraine. 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 Feels just like her prior migraines, all over, non-radiating, severe. There is assoc nausea, vomiting, and photophobia. There has been no assoc fever, chills, neck pain or stiffness, visual change, syncope, head injury, weakness, or rash. Not worst HA of life. Pt reports [facility], but her insurance does not go into effect until 4/1 and they wanted $800 to see her without insurance. Has tried excedrin migraine without relief.   Past Medical History  Diagnosis Date  . Asthma   . Migraine     Past Surgical History  Procedure Date  . Tubal ligation   . Cholecystectomy   . Hernia repair   . Rotator cuff repair     History reviewed. No pertinent family history.  History  Substance Use Topics  . Smoking status: Never Smoker   . Smokeless tobacco: Not on file  . Alcohol Use: No     Review of Systems 10 systems reviewed and are negative for acute change except as noted in the HPI.  Allergies  Vicodin  Home Medications   Current Outpatient Rx  Name Route Sig Dispense Refill  . ASPIRIN-ACETAMINOPHEN-CAFFEINE 250-250-65 MG PO TABS Oral Take 2 tablets by mouth every 6 (six) hours as needed. For migraine      BP 153/91  Pulse 96  Temp(Src) 98.7 F (37.1 C) (Oral)  Resp 18  SpO2 96%  LMP 08/14/2010  Physical Exam  Nursing note and vitals reviewed. Constitutional: She is oriented to person, place, and time. She  appears well-developed and well-nourished.       Lying in a dark room, uncomfortable appearing  HENT:  Head: Normocephalic and atraumatic.  Right Ear: External ear normal.  Left Ear: External ear normal.  Mouth/Throat: Oropharynx is clear and moist.  Eyes: Conjunctivae and EOM are normal. Pupils are equal, round, and reactive to light.       No nystagmus  Neck: Normal range of motion. Neck supple.  Cardiovascular: Normal rate and regular rhythm.   Pulmonary/Chest: Breath sounds normal. No respiratory distress. She has no wheezes.  Abdominal: Soft. Bowel sounds are normal. She exhibits no distension. There is no tenderness.  Musculoskeletal: She exhibits no edema and no tenderness.  Neurological: She is alert and oriented to person, place, and time. No cranial nerve deficit.       F-N intact bilaterally. Normal gait without ataxia.  Skin: Skin is warm and dry. No rash noted.  Psychiatric: She has a normal mood and affect.    ED Course  Procedures (including critical care time)  Labs Reviewed - No data to display No results found.   Dx 1: Migraine HA   MDM  Migraine HA with hx of same. No head injury to suggest SDH. No worst HA of life, sudden onset, or neuro deficit to suggest SAH. No fever, neck pain, rash to suggest meningitis. Pt reports toradol has worked in past- will give toradol, reglan, prednisone, IV  NS bolus.    5:19 PM Pt reports sig improvement in symptoms. She requests rx for home use to prevent recurrent ER visits. I have discussed with her the dangers of toradol use for longer than 2 days at a time, she voices understanding and agrees to take no more than 1-2 doses at home. Will also be given nausea medicine for use if migraine recurs. Advised return to ED for eval if HA different from normal, worst of life, or assoc with warning signs of more serious pathology. Pt voices understanding. WIll f[facility] with [facility] with insurance goes into  effect.        Shaaron Adler, PA-C 09/13/11 1721

## 2011-09-13 NOTE — Progress Notes (Signed)
Pt listed as self pay with no insurance coverage Pt confirms she is self pay [place] resident.  CM  and [facility] coordinator spoke with her Pt offered [facility] services to assist with finding a [place]  self pay provider 

## 2011-09-14 NOTE — ED Provider Notes (Signed)
Medical screening examination/treatment/procedure(s) were conducted as a shared visit with non-physician practitioner(s) and myself.  I personally evaluated the patient during the encounter Pt w 'migraine', gradual onset, same as prior migraines. A/o x 3. Motor intact. No neck stiffness. Will medicate for pain.   Suzi Roots, MD 09/14/11 1540

## 2011-09-16 ENCOUNTER — Emergency Department (HOSPITAL_COMMUNITY)
Admission: EM | Admit: 2011-09-16 | Discharge: 2011-09-16 | Disposition: A | Discharge status: Home / Self Care | Payer: Self-pay | Attending: Emergency Medicine | Admitting: Emergency Medicine

## 2011-09-16 ENCOUNTER — Emergency Department (HOSPITAL_COMMUNITY): Payer: Self-pay

## 2011-09-16 ENCOUNTER — Encounter (HOSPITAL_COMMUNITY): Payer: Self-pay | Admitting: *Deleted

## 2011-09-16 DIAGNOSIS — R42 Dizziness and giddiness: Secondary | ICD-10-CM | POA: Insufficient documentation

## 2011-09-16 DIAGNOSIS — H53459 Other localized visual field defect, unspecified eye: Secondary | ICD-10-CM | POA: Insufficient documentation

## 2011-09-16 DIAGNOSIS — G43909 Migraine, unspecified, not intractable, without status migrainosus: Secondary | ICD-10-CM | POA: Insufficient documentation

## 2011-09-16 DIAGNOSIS — R110 Nausea: Secondary | ICD-10-CM | POA: Insufficient documentation

## 2011-09-16 HISTORY — DX: Essential (primary) hypertension: I10

## 2011-09-16 LAB — POCT I-STAT, CHEM 8
BUN: 10 mg/dL (ref 6–23)
Calcium, Ion: 1.21 mmol/L (ref 1.12–1.32)
Chloride: 102 mEq/L (ref 96–112)
Creatinine, Ser: 0.5 mg/dL (ref 0.50–1.10)
Glucose, Bld: 118 mg/dL — ABNORMAL HIGH (ref 70–99)
HCT: 35 % — ABNORMAL LOW (ref 36.0–46.0)
Hemoglobin: 11.9 g/dL — ABNORMAL LOW (ref 12.0–15.0)
Potassium: 3.7 mEq/L (ref 3.5–5.1)
Sodium: 140 mEq/L (ref 135–145)
TCO2: 26 mmol/L (ref 0–100)

## 2011-09-16 MED ORDER — PROMETHAZINE HCL 25 MG/ML IJ SOLN
25.0000 mg | Freq: Once | INTRAMUSCULAR | Status: AC
  Administered 2011-09-16: 25 mg via INTRAMUSCULAR
  Filled 2011-09-16: qty 1

## 2011-09-16 MED ORDER — DIPHENHYDRAMINE HCL 50 MG/ML IJ SOLN
25.0000 mg | Freq: Once | INTRAMUSCULAR | Status: AC
  Administered 2011-09-16: 25 mg via INTRAMUSCULAR
  Filled 2011-09-16: qty 1

## 2011-09-16 MED ORDER — PREDNISONE 50 MG PO TABS
50.0000 mg | ORAL_TABLET | Freq: Every day | ORAL | Status: DC
  Filled 2011-09-16: qty 1

## 2011-09-16 MED ORDER — KETOROLAC TROMETHAMINE 30 MG/ML IJ SOLN
30.0000 mg | Freq: Once | INTRAMUSCULAR | Status: AC
  Administered 2011-09-16: 30 mg via INTRAMUSCULAR
  Filled 2011-09-16: qty 1

## 2011-09-16 NOTE — ED Provider Notes (Signed)
History     CSN: [id number]  Arrival date & time 09/16/11  1356   First MD Initiated Contact with Patient 09/16/11 1709      Chief Complaint  Patient presents with  . Migraine  . Dizziness  . Spots and/or Floaters    "lights flashing"  . Emesis   "migraines" for past month.  Seen on Feb 5 and Feb 22.  And then again this past Wednesday.   Was dc'd home On Toradol and Antiemetic.   R Temporal.     (Consider location/radiation/quality/duration/timing/severity/associated sxs/prior treatment) Patient is a 46 y.o. female presenting with migraine and vomiting. The history is provided by the patient.  Migraine This is a recurrent problem. The current episode started more than 2 days ago. The problem occurs constantly. Pertinent negatives include no chest pain, no abdominal pain and no shortness of breath. The symptoms are aggravated by stress. The symptoms are relieved by rest, sleep and NSAIDs.  Emesis  Pertinent negatives include no abdominal pain.   Usually improves with dark room.  States "they wont let me wear sunglasses at work."   Photophobia.  Phonophobia. Nausea.  Vomited 1x this am. This am dizzy and lightheaded.  Sees "little flickers" of light.    Past Medical History  Diagnosis Date  . Asthma   . Migraine   . Hypertension     Past Surgical History  Procedure Date  . Tubal ligation   . Cholecystectomy   . Hernia repair   . Rotator cuff repair     History reviewed. No pertinent family history.  History  Substance Use Topics  . Smoking status: Never Smoker   . Smokeless tobacco: Never Used  . Alcohol Use: No    OB History    Grav Para Term Preterm Abortions TAB SAB Ect Mult Living                  Review of Systems  Respiratory: Negative for shortness of breath.   Cardiovascular: Negative for chest pain.  Gastrointestinal: Positive for vomiting. Negative for abdominal pain.  All other systems reviewed and are negative.    Allergies   Vicodin  Home Medications   Current Outpatient Rx  Name Route Sig Dispense Refill  . ASPIRIN-ACETAMINOPHEN-CAFFEINE 250-250-65 MG PO TABS Oral Take 2 tablets by mouth every 6 (six) hours as needed. For migraine    . KETOROLAC TROMETHAMINE 10 MG PO TABS Oral Take 1 tablet (10 mg total) by mouth every 6 (six) hours as needed for pain. 10 tablet 0  . METOCLOPRAMIDE HCL 10 MG PO TABS Oral Take 1 tablet (10 mg total) by mouth every 6 (six) hours as needed (for nausea and/or vomiting). 10 tablet 0    BP 128/69  Pulse 86  Temp(Src) 98.2 F (36.8 C) (Oral)  Resp 17  Ht 5\' 7"  (1.702 m)  Wt 300 lb (136.079 kg)  BMI 46.99 kg/m2  SpO2 99%  LMP 09/09/2011  Physical Exam  Nursing note and vitals reviewed. Constitutional: She is oriented to person, place, and time. She appears well-developed and well-nourished.  HENT:  Head: Normocephalic and atraumatic.  Eyes: Conjunctivae and EOM are normal. Pupils are equal, round, and reactive to light.  Neck: Neck supple.  Cardiovascular: Normal rate and regular rhythm.  Exam reveals no gallop and no friction rub.   No murmur heard. Pulmonary/Chest: Breath sounds normal. She has no wheezes. She has no rales. She exhibits no tenderness.  Abdominal: Soft. Bowel sounds are normal. She  exhibits no distension. There is no tenderness. There is no rebound and no guarding.  Musculoskeletal: Normal range of motion.  Neurological: She is alert and oriented to person, place, and time. She displays normal reflexes. No cranial nerve deficit. She exhibits normal muscle tone. Coordination normal.  Skin: Skin is warm and dry. No rash noted.  Psychiatric: She has a normal mood and affect.    ED Course  Procedures (including critical care time)  Labs Reviewed - No data to display No results found.   No diagnosis found.    MDM  Pt is seen and examined;  Initial history and physical completed.  Will follow.    Results for orders placed during the hospital  encounter of 09/16/11  POCT I-STAT, CHEM 8      Component Value Range   Sodium 140  135 - 145 (mEq/L)   Potassium 3.7  3.5 - 5.1 (mEq/L)   Chloride 102  96 - 112 (mEq/L)   BUN 10  6 - 23 (mg/dL)   Creatinine, Ser 1.61  0.50 - 1.10 (mg/dL)   Glucose, Bld 096 (*) 70 - 99 (mg/dL)   Calcium, Ion 0.45  4.09 - 1.32 (mmol/L)   TCO2 26  0 - 100 (mmol/L)   Hemoglobin 11.9 (*) 12.0 - 15.0 (g/dL)   HCT 81.1 (*) 91.4 - 46.0 (%)   Ct Head Wo Contrast  09/16/2011  *RADIOLOGY REPORT*  Clinical Data: 46 year old female with headache, nausea and scotomas.  CT HEAD WITHOUT CONTRAST  Technique:  Contiguous axial images were obtained from the base of the skull through the vertex without contrast.  Comparison: 12/10/2003  Findings: No intracranial abnormalities are identified, including mass lesion or mass effect, hydrocephalus, extra-axial fluid collection, midline shift, hemorrhage, or acute infarction.  The visualized bony calvarium is unremarkable.  IMPRESSION: Normal noncontrast head CT.  Original Report Authenticated By: Rosendo Gros, M.D.      Patient reassessed in the room. She states she is feeling better, but would like to rest. CAT scan was normal. Likely panel was normal. Vitals are normal. Full recheck. Blood pressure. Anticipate discharge     11:00 PM  Patient has rested and feels better. She wants to go home. Short he has prescriptions for Toradol and Reglan were given to her primary doctor. She has ordered [facility].  Stable for dispo.          Oleda Borski A. Patrica Duel, MD 09/16/11 7829

## 2011-09-16 NOTE — ED Notes (Signed)
Patient transported to CT 

## 2011-09-16 NOTE — ED Notes (Signed)
Pt from home with reports of migraine since Wednesday, was treated at [facility] on Wednesday with some improvement in symptoms but symptoms returned on Thursday as well as reports of diarrhea, dizziness and seeing flashes of light that started yesterday.

## 2011-09-16 NOTE — ED Notes (Signed)
GCS 15--- PEERL

## 2011-09-16 NOTE — Discharge Instructions (Signed)
Migraine Headache A migraine is very bad pain on one or both sides of your head. The cause of a migraine is not always known. A migraine can be triggered or caused by different things, such as:  Alcohol.   Smoking.   Stress.   Periods (menstruation) in women.   Aged cheeses.   Foods or drinks that contain nitrates, glutamate, aspartame, or tyramine.   Lack of sleep.   Chocolate.   Caffeine.   Hunger.   Medicines, such as nitroglycerine (used to treat chest pain), birth control pills, estrogen, and some blood pressure medicines.  HOME CARE  Many medicines can help migraine pain or keep migraines from coming back. Your doctor can help you decide on a medicine or treatment program.   If you or your child gets a migraine, it may help to lie down in a dark, quiet room.   Keep a headache journal. This may help find out what is causing the headaches. For example, write down:   What you eat and drink.   How much sleep you get.   Any change to your diet or medicines.  GET HELP RIGHT AWAY IF:   The medicine does not work.   The pain begins again.   The neck is stiff.   You have trouble seeing.   The muscles are weak or you lose muscle control.   You have new symptoms.   You lose your balance.   You have trouble walking.   You feel faint or pass out.  MAKE SURE YOU:   Understand these instructions.   Will watch this condition.   Will get help right away if you are not doing well or get worse.  Document Released: 04/04/2008 Document Revised: 06/15/2011 Document Reviewed: 03/01/2009 [facility] Patient Information 2012 [place], [place].    If you have no primary doctor, here are some resources that may be helpful:  Medicaid-accepting [facility] Providers:   - [facility]- [address]      [phone]      Mon-Fri 9am-7pm, Sat 9am-1pm   - [facility]- [address] [place], [place]      [phone]   - [facility]- [address], [address] [place]      [phone]   [facility]- [address]      [phone]   - Renaye Rakers- [address] [place], [address]      [phone]      Only accepts [place] patients       after they have her name applied to their card   Self Pay (no insurance) in [place]:   - Sickle Cell Patients: Dr Willey Blade, [facility]      [address] [place]      [phone]   - [facility] [phone]   - Physician Referral Service- [phone]   - [facility]- [address] [place]      [phone]   [facility]- [address]   - [facility]- see information above      (Speak to [employer] if you do not have insurance)   - [facility]- [address] [place]      [phone]   - [facility]- [address] [place]      [phone]   - [facility]- [address]      [phone]   - Dr Julio Sicks-  [address], [place]      [phone]   - [facility]- [address]      [phone]   - [facility]- [address]      [phone]      Also [address]      [phone]   - [facility]- [address]      [phone]      1st and 3rd Saturday every month, 10am-1pm Other agencies that provide inexpensive medical care:    [facility]  [phone]    [facility]  [phone]    [facility]  [phone]    [facility]  [phone]    [facility]  [phone]  General Information: Finding a doctor when you do not have health insurance can be tricky. Although you are not limited by an insurance plan, you are of course limited by her finances and how much but he can pay out of pocket.  What are your options if you don't have health insurance?   1) Find a Librarian, academic and Pay Out of Pocket Although you won't have to find out who is  covered by your insurance plan, it is a good idea to ask around and get recommendations. You will then need to call the office and see if the doctor you have chosen will accept you as a new patient and what types of options they offer for patients who are self-pay. Some doctors offer discounts or will set up payment plans for their patients who do not have insurance, but you will need to ask so you aren't surprised when you get to your appointment.  2) Contact Your Local Health Department Not all health departments have doctors that can see patients for sick visits, but many do, so it is worth a call to see if yours does. If you don't know where your local health department is, you can check in your phone book. The CDC also has a tool to help you locate your state's health department, and many state websites also have listings of all of their local health departments.  3) Find a Walk-in Clinic If your illness is not likely to be very severe or complicated, you may want to try a walk in clinic. These are popping up all over the country in pharmacies, drugstores, and shopping centers. They're usually staffed by nurse practitioners or physician assistants that have been trained to treat common illnesses and complaints. They're usually fairly quick and inexpensive. However, if you have serious medical issues or chronic medical problems, these are probably not your best option

## 2011-10-06 ENCOUNTER — Emergency Department (HOSPITAL_COMMUNITY)
Admission: EM | Admit: 2011-10-06 | Discharge: 2011-10-06 | Disposition: A | Discharge status: Home / Self Care | Payer: Self-pay | Attending: Emergency Medicine | Admitting: Emergency Medicine

## 2011-10-06 DIAGNOSIS — R51 Headache: Secondary | ICD-10-CM | POA: Insufficient documentation

## 2011-10-06 DIAGNOSIS — I10 Essential (primary) hypertension: Secondary | ICD-10-CM | POA: Insufficient documentation

## 2011-10-06 DIAGNOSIS — J45909 Unspecified asthma, uncomplicated: Secondary | ICD-10-CM | POA: Insufficient documentation

## 2011-10-06 MED ORDER — KETOROLAC TROMETHAMINE 30 MG/ML IJ SOLN
30.0000 mg | Freq: Once | INTRAMUSCULAR | Status: AC
  Administered 2011-10-06: 30 mg via INTRAVENOUS
  Filled 2011-10-06: qty 1

## 2011-10-06 MED ORDER — METOCLOPRAMIDE HCL 5 MG/ML IJ SOLN
10.0000 mg | Freq: Once | INTRAMUSCULAR | Status: AC
Start: 2011-10-06 — End: 2011-10-06
  Administered 2011-10-06: 10 mg via INTRAVENOUS
  Filled 2011-10-06: qty 2

## 2011-10-06 MED ORDER — KETOROLAC TROMETHAMINE 10 MG PO TABS: 10.0000 mg | ORAL_TABLET | Freq: Four times a day (QID) | ORAL | Status: AC | PRN

## 2011-10-06 MED ORDER — SODIUM CHLORIDE 0.9 % IV SOLN
Freq: Once | INTRAVENOUS | Status: AC
  Administered 2011-10-06: 13:00:00 via INTRAVENOUS

## 2011-10-06 NOTE — Discharge Instructions (Signed)

## 2011-10-06 NOTE — ED Provider Notes (Signed)
History     CSN: [id number]  Arrival date & time 10/06/11  1240   First MD Initiated Contact with Patient 10/06/11 1259      No chief complaint on file.   (Consider location/radiation/quality/duration/timing/severity/associated sxs/prior treatment) Patient is a 46 y.o. female presenting with headaches. The history is provided by the patient.  Headache  This is a recurrent problem. The current episode started yesterday. The problem occurs constantly. The problem has been gradually worsening. The headache is associated with nothing. The pain is located in the frontal region. The quality of the pain is described as throbbing. The pain is moderate. The pain does not radiate. Pertinent negatives include no fever, no palpitations, no nausea and no vomiting.    Past Medical History  Diagnosis Date  . Asthma   . Migraine   . Hypertension     Past Surgical History  Procedure Date  . Tubal ligation   . Cholecystectomy   . Hernia repair   . Rotator cuff repair     No family history on file.  History  Substance Use Topics  . Smoking status: Never Smoker   . Smokeless tobacco: Never Used  . Alcohol Use: No    OB History    Grav Para Term Preterm Abortions TAB SAB Ect Mult Living                  Review of Systems  Constitutional: Negative for fever.  Cardiovascular: Negative for palpitations.  Gastrointestinal: Negative for nausea and vomiting.  Neurological: Positive for headaches.  All other systems reviewed and are negative.    Allergies  Vicodin  Home Medications   Current Outpatient Rx  Name Route Sig Dispense Refill  . ASPIRIN-ACETAMINOPHEN-CAFFEINE 250-250-65 MG PO TABS Oral Take 2 tablets by mouth every 6 (six) hours as needed. For migraine      BP 132/87  Pulse 78  Temp(Src) 98.7 F (37.1 C) (Oral)  LMP 09/09/2011  Physical Exam  Nursing note and vitals reviewed. Constitutional: She is oriented to person, place, and time. She appears  well-developed and well-nourished. No distress.  HENT:  Head: Normocephalic and atraumatic.  Neck: Normal range of motion. Neck supple.  Cardiovascular: Normal rate and regular rhythm.  Exam reveals no gallop and no friction rub.   No murmur heard. Pulmonary/Chest: Effort normal and breath sounds normal. No respiratory distress. She has no wheezes.  Abdominal: Soft. Bowel sounds are normal. She exhibits no distension. There is no tenderness.  Musculoskeletal: Normal range of motion.  Neurological: She is alert and oriented to person, place, and time. No cranial nerve deficit. She exhibits normal muscle tone. Coordination normal.  Skin: Skin is warm and dry. She is not diaphoretic.    ED Course  Procedures (including critical care time)  Labs Reviewed - No data to display No results found.   No diagnosis found.    MDM  Feels better.  Will prescribe toradol as this has worked in the past.        Geoffery Lyons, MD 10/06/11 [phone]

## 2011-10-06 NOTE — ED Notes (Signed)
PT STATES SHE TOOK tORODOL LAST PM AND IT EASED THE PAIN BUT IT HAS NOW RETURNED. sHE WORKS FOR [employer], AND STATES SHE IS UNDER STRESS AT WORK FROM UNHAPPY CUSTOMERS

## 2011-10-06 NOTE — ED Notes (Signed)
Lights out in rm, quiet. Pt resting and allowing med to give relief.

## 2011-10-06 NOTE — Progress Notes (Signed)
During ED visit ED CM spoke with pt who states she has new job and pending probational period she will have insurance coverage Pt is aware of Headache wellness clinic also Confirms no pcp and coverage at this time. CM instructed pt on how to obtain in network pcps for her new insurance provider Voiced understanding and appreciation of resources

## 2011-10-21 ENCOUNTER — Emergency Department (HOSPITAL_COMMUNITY)
Admission: EM | Admit: 2011-10-21 | Discharge: 2011-10-22 | Disposition: A | Discharge status: Home / Self Care | Payer: BC Managed Care – PPO | Attending: Emergency Medicine | Admitting: Emergency Medicine

## 2011-10-21 ENCOUNTER — Encounter (HOSPITAL_COMMUNITY): Payer: Self-pay | Admitting: Emergency Medicine

## 2011-10-21 DIAGNOSIS — I10 Essential (primary) hypertension: Secondary | ICD-10-CM | POA: Insufficient documentation

## 2011-10-21 DIAGNOSIS — J3489 Other specified disorders of nose and nasal sinuses: Secondary | ICD-10-CM | POA: Insufficient documentation

## 2011-10-21 DIAGNOSIS — J029 Acute pharyngitis, unspecified: Secondary | ICD-10-CM

## 2011-10-21 DIAGNOSIS — R51 Headache: Secondary | ICD-10-CM | POA: Insufficient documentation

## 2011-10-21 DIAGNOSIS — J45909 Unspecified asthma, uncomplicated: Secondary | ICD-10-CM | POA: Insufficient documentation

## 2011-10-21 DIAGNOSIS — IMO0001 Reserved for inherently not codable concepts without codable children: Secondary | ICD-10-CM | POA: Insufficient documentation

## 2011-10-21 NOTE — ED Notes (Signed)
Pt c/o sinus drainage x 1 week, sore throat x 2 days, pt is hoarse. Pt also c/o bilat LE swelling

## 2011-10-22 LAB — RAPID STREP SCREEN (MED CTR MEBANE ONLY): Streptococcus, Group A Screen (Direct): NEGATIVE

## 2011-10-22 MED ORDER — GI COCKTAIL ~~LOC~~
30.0000 mL | Freq: Once | ORAL | Status: AC
  Administered 2011-10-22: 30 mL via ORAL
  Filled 2011-10-22: qty 30

## 2011-10-22 MED ORDER — PREDNISONE 20 MG PO TABS
60.0000 mg | ORAL_TABLET | Freq: Once | ORAL | Status: AC
  Administered 2011-10-22: 60 mg via ORAL
  Filled 2011-10-22: qty 3

## 2011-10-22 MED ORDER — AZITHROMYCIN 250 MG PO TABS: ORAL_TABLET | ORAL | Status: DC

## 2011-10-22 NOTE — ED Notes (Signed)
Patient given discharge instructions, information, prescriptions, and diet order. Patient states that they adequately understand discharge information given and to return to ED if symptoms return or worsen.     

## 2011-10-22 NOTE — Discharge Instructions (Signed)
Your strep throat test was negative. Please continue to use over-the-counter cough and cold treatments for your symptoms. Take Tylenol or ibuprofen for fever and body ache. Please followup with a primary care provider. Return to emergency room for worsening symptoms.  Pharyngitis, Viral and Bacterial Pharyngitis is soreness (inflammation) or infection of the pharynx. It is also called a sore throat. CAUSES  Most sore throats are caused by viruses and are part of a cold. However, some sore throats are caused by strep and other bacteria. Sore throats can also be caused by post nasal drip from draining sinuses, allergies and sometimes from sleeping with an open mouth. Infectious sore throats can be spread from person to person by coughing, sneezing and sharing cups or eating utensils. TREATMENT  Sore throats that are viral usually last 3-4 days. Viral illness will get better without medications (antibiotics). Strep throat and other bacterial infections will usually begin to get better about 24-48 hours after you begin to take antibiotics. HOME CARE INSTRUCTIONS   If the caregiver feels there is a bacterial infection or if there is a positive strep test, they will prescribe an antibiotic. The full course of antibiotics must be taken. If the full course of antibiotic is not taken, you or your child may become ill again. If you or your child has strep throat and do not finish all of the medication, serious heart or kidney diseases may develop.   Drink enough water and fluids to keep your urine clear or pale yellow.   Only take over-the-counter or prescription medicines for pain, discomfort or fever as directed by your caregiver.   Get lots of rest.   Gargle with salt water ( tsp. of salt in a glass of water) as often as every 1-2 hours as you need for comfort.   Hard candies may soothe the throat if individual is not at risk for choking. Throat sprays or lozenges may also be used.  SEEK MEDICAL CARE  IF:   Large, tender lumps in the neck develop.   A rash develops.   Green, yellow-brown or bloody sputum is coughed up.   Your baby is older than 3 months with a rectal temperature of 100.5 F (38.1 C) or higher for more than 1 day.  SEEK IMMEDIATE MEDICAL CARE IF:   A stiff neck develops.   You or your child are drooling or unable to swallow liquids.   You or your child are vomiting, unable to keep medications or liquids down.   You or your child has severe pain, unrelieved with recommended medications.   You or your child are having difficulty breathing (not due to stuffy nose).   You or your child are unable to fully open your mouth.   You or your child develop redness, swelling, or severe pain anywhere on the neck.   You have a fever.   Your baby is older than 3 months with a rectal temperature of 102 F (38.9 C) or higher.   Your baby is 55 months old or younger with a rectal temperature of 100.4 F (38 C) or higher.  MAKE SURE YOU:   Understand these instructions.   Will watch your condition.   Will get help right away if you are not doing well or get worse.  Document Released: 06/26/2005 Document Revised: 06/15/2011 Document Reviewed: 09/23/2007 [facility] Patient Information 2012 [place], [place].

## 2011-10-22 NOTE — ED Provider Notes (Signed)
History     CSN: [id number]  Arrival date & time 10/21/11  2111   First MD Initiated Contact with Patient 10/22/11 0046      Chief Complaint  Patient presents with  . Sore Throat    HPI  History provided by the patient. Patient is a 46 year old female with history of asthma, hypertension migraine who presents with complaints of URI symptoms for the past week. Symptoms began with nasal congestion and postnasal drip. Patient reports developing dry cough over the past 4 days. She has also developed sore throat and hoarse voice. Throat pain worse with swallowing and eating. Patient has stented using over-the-counter topical medicines including throat lozenges without significant improvement. Patient denies any fever, nausea, vomiting or diarrhea. Symptoms are stress moderate. Patient denies any other aggravating or alleviating factors.    Past Medical History  Diagnosis Date  . Asthma   . Migraine   . Hypertension     Past Surgical History  Procedure Date  . Tubal ligation   . Cholecystectomy   . Hernia repair   . Rotator cuff repair     No family history on file.  History  Substance Use Topics  . Smoking status: Never Smoker   . Smokeless tobacco: Never Used  . Alcohol Use: No    OB History    Grav Para Term Preterm Abortions TAB SAB Ect Mult Living                  Review of Systems  Constitutional: Positive for chills. Negative for fever.  HENT: Positive for congestion, sore throat and rhinorrhea.   Respiratory: Positive for cough. Negative for shortness of breath.   Cardiovascular: Negative for chest pain.  Gastrointestinal: Negative for nausea, vomiting and diarrhea.  Musculoskeletal: Positive for myalgias.  Neurological: Positive for headaches. Negative for dizziness and light-headedness.    Allergies  Vicodin  Home Medications   Current Outpatient Rx  Name Route Sig Dispense Refill  . FEXOFENADINE-PSEUDOEPHED ER 180-240 MG PO TB24 Oral Take 1  tablet by mouth daily.    Marland Kitchen KETOROLAC TROMETHAMINE 10 MG PO TABS Oral Take 10 mg by mouth every 6 (six) hours as needed. For pain      BP 121/77  Pulse 88  Temp(Src) 98 F (36.7 C) (Oral)  Resp 18  Ht 5\' 7"  (1.702 m)  Wt 300 lb (136.079 kg)  BMI 46.99 kg/m2  SpO2 99%  LMP 10/09/2011  Physical Exam  Nursing note and vitals reviewed. Constitutional: She is oriented to person, place, and time. She appears well-developed and well-nourished. No distress.  HENT:  Head: Normocephalic and atraumatic.       Tonsils enlarged with slight erythema but no exudate. Uvula midline. No signs PTA.  Neck: Normal range of motion. Neck supple. No tracheal deviation present. No thyromegaly present.  Cardiovascular: Normal rate and regular rhythm.   Pulmonary/Chest: Effort normal and breath sounds normal. No stridor. No respiratory distress. She has no wheezes. She has no rales.  Abdominal: Soft. She exhibits no distension. There is no tenderness.  Lymphadenopathy:    She has no cervical adenopathy.  Neurological: She is alert and oriented to person, place, and time.  Skin: Skin is warm and dry. No rash noted.  Psychiatric: She has a normal mood and affect. Her behavior is normal.    ED Course  Procedures     1. Pharyngitis       MDM  Patient seen and evaluated. Patient no acute distress.  Angus Seller, [place] 10/22/11 [phone]

## 2011-10-22 NOTE — ED Provider Notes (Signed)
Medical screening examination/treatment/procedure(s) were performed by non-physician practitioner and as supervising physician I was immediately available for consultation/collaboration.  Rhylee Nunn K Levy Wellman-Rasch, MD 10/22/11 2319 

## 2011-10-27 ENCOUNTER — Emergency Department (HOSPITAL_COMMUNITY)
Admission: EM | Admit: 2011-10-27 | Discharge: 2011-10-27 | Disposition: A | Discharge status: Home / Self Care | Payer: BC Managed Care – PPO | Source: Home / Self Care

## 2011-10-27 ENCOUNTER — Emergency Department (INDEPENDENT_AMBULATORY_CARE_PROVIDER_SITE_OTHER)
Admission: EM | Admit: 2011-10-27 | Discharge: 2011-10-27 | Payer: BC Managed Care – PPO | Source: Home / Self Care | Attending: Emergency Medicine | Admitting: Emergency Medicine

## 2011-10-27 ENCOUNTER — Encounter (HOSPITAL_COMMUNITY): Payer: Self-pay | Admitting: *Deleted

## 2011-10-27 DIAGNOSIS — J040 Acute laryngitis: Secondary | ICD-10-CM

## 2011-10-27 DIAGNOSIS — K219 Gastro-esophageal reflux disease without esophagitis: Secondary | ICD-10-CM

## 2011-10-27 MED ORDER — ESOMEPRAZOLE MAGNESIUM 40 MG PO CPDR: 40.0000 mg | DELAYED_RELEASE_CAPSULE | Freq: Every day | ORAL | Status: DC

## 2011-10-27 NOTE — ED Notes (Signed)
Pt states she lost her voice about 2 weeks ago, was treated at [facility] a week ago with antibiotic and Prednisone.  States still has slight sorethroat.  Has no voice except for a squeak.

## 2011-10-27 NOTE — ED Provider Notes (Signed)
History     CSN: [id number]  Arrival date & time 10/27/11  1403   None     Chief Complaint  Patient presents with  . Laryngitis    (Consider location/radiation/quality/duration/timing/severity/associated sxs/prior treatment) HPI Comments: Patient presents today with complaints of loss of her voice for 2 weeks. She was seen at [facility] long ED and 10/21/2011. She has had some mild nasal congestion and postnasal drainage, and slight sore throat also. She was treated with Allegra-D, prednisone, and a Z-Pak. She states she had some mild improvement of symptoms but only for a couple of days. She has heartburn symptoms 2-3 times a week, with certain foods. She does not take anything for symptoms. She is a nonsmoker. She has a hx of asthma, but is not on any asthma inhalers.      Past Medical History  Diagnosis Date  . Asthma   . Migraine   . Hypertension     Past Surgical History  Procedure Date  . Tubal ligation   . Cholecystectomy   . Hernia repair   . Rotator cuff repair     History reviewed. No pertinent family history.  History  Substance Use Topics  . Smoking status: Never Smoker   . Smokeless tobacco: Never Used  . Alcohol Use: No    OB History    Grav Para Term Preterm Abortions TAB SAB Ect Mult Living                  Review of Systems  Constitutional: Negative for fever, chills and fatigue.  HENT: Positive for congestion, sore throat, voice change and postnasal drip. Negative for ear pain, sneezing, trouble swallowing and sinus pressure.   Respiratory: Negative for cough, shortness of breath and wheezing.   Cardiovascular: Negative for chest pain and palpitations.    Allergies  Vicodin  Home Medications   Current Outpatient Rx  Name Route Sig Dispense Refill  . ESOMEPRAZOLE MAGNESIUM 40 MG PO CPDR Oral Take 1 capsule (40 mg total) by mouth daily. 14 capsule 0  . FEXOFENADINE-PSEUDOEPHED ER 180-240 MG PO TB24 Oral Take 1 tablet by mouth daily.       BP 129/86  Pulse 86  Temp(Src) 98.1 F (36.7 C) (Oral)  Resp 18  SpO2 100%  LMP 10/09/2011  Physical Exam  Constitutional: She appears well-developed and well-nourished. No distress.  HENT:  Head: Normocephalic and atraumatic.  Right Ear: Tympanic membrane, external ear and ear canal normal.  Left Ear: Tympanic membrane, external ear and ear canal normal.  Nose: Nose normal.  Mouth/Throat: Uvula is midline, oropharynx is clear and moist and mucous membranes are normal. No oropharyngeal exudate, posterior oropharyngeal edema or posterior oropharyngeal erythema.       Loss of voice noted, pt barely has a squeek when she speaks.   Neck: Neck supple.  Cardiovascular: Normal rate, regular rhythm and normal heart sounds.   Pulmonary/Chest: Effort normal and breath sounds normal. No respiratory distress.  Lymphadenopathy:    She has no cervical adenopathy.  Neurological: She is alert.  Skin: Skin is warm and dry.  Psychiatric: She has a normal mood and affect.    ED Course  Procedures (including critical care time)  Labs Reviewed - No data to display No results found.   1. Laryngitis acute   2. GERD (gastroesophageal reflux disease)       MDM  10-21-11 ED visit reviewed.          Melody Comas, [place] 10/27/11 1721

## 2011-10-27 NOTE — Discharge Instructions (Signed)
Voice rest. Increase fluids. Continue Allegra D as needed for nasal congestion and drainage. Take Omeprazole daily for heartburn symptoms. Call Dr Clovis Pu office on Monday for a follow up appt.   Laryngitis At the top of your windpipe is your voice box. It is the source of your voice. Inside your voice box are 2 bands of muscles called vocal cords. When you breathe, your vocal cords are relaxed and open so that air can get into the lungs. When you decide to say something, these cords come together and vibrate. The sound from these vibrations goes into your throat and comes out through your mouth as sound. Laryngitis is an inflammation of the vocal cords that causes hoarseness, cough, loss of voice, sore throat, and dry throat. Laryngitis can be temporary (acute) or long-term (chronic). Most cases of acute laryngitis improve with time.Chronic laryngitis lasts for more than 3 weeks. CAUSES Laryngitis can often be related to excessive smoking, talking, or yelling, as well as inhalation of toxic fumes and allergies. Acute laryngitis is usually caused by a viral infection, vocal strain, measles or mumps, or bacterial infections. Chronic laryngitis is usually caused by vocal cord strain, vocal cord injury, postnasal drip, growths on the vocal cords, or acid reflux. SYMPTOMS   Cough.   Sore throat.   Dry throat.  RISK FACTORS  Respiratory infections.   Exposure to irritating substances, such as cigarette smoke, excessive amounts of alcohol, stomach acids, and workplace chemicals.   Voice trauma, such as vocal cord injury from shouting or speaking too loud.  DIAGNOSIS  Your cargiver will perform a physical exam. During the physical exam, your caregiver will examine your throat. The most common sign of laryngitis is hoarseness. Laryngoscopy may be necessary to confirm the diagnosis of this condition. This procedure allows your caregiver to look into the larynx. HOME CARE INSTRUCTIONS  Drink  enough fluids to keep your urine clear or pale yellow.   Rest until you no longer have symptoms or as directed by your caregiver.   Breathe in moist air.   Take all medicine as directed by your caregiver.   Do not smoke.   Talk as little as possible (this includes whispering).   Write on paper instead of talking until your voice is back to normal.   Follow up with your caregiver if your condition has not improved after 10 days.  SEEK MEDICAL CARE IF:   You have trouble breathing.   You cough up blood.   You have persistent fever.   You have increasing pain.   You have difficulty swallowing.  MAKE SURE YOU:  Understand these instructions.   Will watch your condition.   Will get help right away if you are not doing well or get worse.  Document Released: 06/26/2005 Document Revised: 06/15/2011 Document Reviewed: 09/01/2010 [facility] Patient Information 2012 [place], [place].

## 2011-10-28 NOTE — ED Provider Notes (Signed)
Medical screening examination/treatment/procedure(s) were performed by non-physician practitioner and as supervising physician I was immediately available for consultation/collaboration.   [facility]; MD   Sharin Grave, MD 10/28/11 [phone]

## 2011-11-26 ENCOUNTER — Emergency Department (HOSPITAL_COMMUNITY)
Admission: EM | Admit: 2011-11-26 | Discharge: 2011-11-26 | Disposition: A | Discharge status: Home / Self Care | Payer: BC Managed Care – PPO | Source: Home / Self Care | Attending: Emergency Medicine | Admitting: Emergency Medicine

## 2011-11-26 ENCOUNTER — Encounter (HOSPITAL_COMMUNITY): Payer: Self-pay | Admitting: *Deleted

## 2011-11-26 DIAGNOSIS — J309 Allergic rhinitis, unspecified: Secondary | ICD-10-CM

## 2011-11-26 LAB — POCT RAPID STREP A: Streptococcus, Group A Screen (Direct): NEGATIVE

## 2011-11-26 MED ORDER — FLUTICASONE PROPIONATE 50 MCG/ACT NA SUSP: 2.0000 | Freq: Every day | NASAL | Status: DC

## 2011-11-26 NOTE — ED Notes (Signed)
Pt with onset of cough/congestion/facial tenderness x 3 days - history sinus infection - dry cough - yellowish green mucus

## 2011-11-26 NOTE — Discharge Instructions (Signed)
Use your saline irrigation as many times a day as needed to manage your symptoms.  Make sure you are using distilled water and salt without iodine in it (such as kosher or sea salt).  Don't use the saline and the Flonase together or you'll wash the flonase out of your nose.   Allergic Rhinitis Allergic rhinitis is when the mucous membranes in the nose respond to allergens. Allergens are particles in the air that cause your body to have an allergic reaction. This causes you to release allergic antibodies. Through a chain of events, these eventually cause you to release histamine into the blood stream (hence the use of antihistamines). Although meant to be protective to the body, it is this release that causes your discomfort, such as frequent sneezing, congestion and an itchy runny nose.  CAUSES  The pollen allergens may come from grasses, trees, and weeds. This is seasonal allergic rhinitis, or "hay fever." Other allergens cause year-round allergic rhinitis (perennial allergic rhinitis) such as house dust mite allergen, pet dander and mold spores.  SYMPTOMS   Nasal stuffiness (congestion).   Runny, itchy nose with sneezing and tearing of the eyes.   There is often an itching of the mouth, eyes and ears.  It cannot be cured, but it can be controlled with medications. DIAGNOSIS  If you are unable to determine the offending allergen, skin or blood testing may find it. TREATMENT   Avoid the allergen.   Medications and allergy shots (immunotherapy) can help.   Hay fever may often be treated with antihistamines in pill or nasal spray forms. Antihistamines block the effects of histamine. There are over-the-counter medicines that may help with nasal congestion and swelling around the eyes. Check with your caregiver before taking or giving this medicine.  If the treatment above does not work, there are many new medications your caregiver can prescribe. Stronger medications may be used if initial  measures are ineffective. Desensitizing injections can be used if medications and avoidance fails. Desensitization is when a patient is given ongoing shots until the body becomes less sensitive to the allergen. Make sure you follow up with your caregiver if problems continue. SEEK MEDICAL CARE IF:   You develop fever (more than 100.5 F (38.1 C).   You develop a cough that does not stop easily (persistent).   You have shortness of breath.   You start wheezing.   Symptoms interfere with normal daily activities.  Document Released: 03/21/2001 Document Revised: 06/15/2011 Document Reviewed: 09/30/2008 [facility] Patient Information 2012 [place], [place].

## 2011-11-26 NOTE — ED Provider Notes (Signed)
History     CSN: [id number]  Arrival date & time 11/26/11  1451   First MD Initiated Contact with Patient 11/26/11 1624      Chief Complaint  Patient presents with  . Nasal Congestion  . Cough  . Facial Pain  . Sore Throat    (Consider location/radiation/quality/duration/timing/severity/associated sxs/prior treatment) HPI Comments: Pt with seasonal allergies that are very bad this season.  Much worse in last 3-4 days with increased sinus pressure, yellow-green drainage. Pt using saline irrigation twice daily, feels relief temporarily when she uses it.   Patient is a 46 y.o. female presenting with cough and pharyngitis. The history is provided by the patient.  Cough This is a new problem. The current episode started more than 2 days ago (3-4 days ago). The problem has not changed since onset.The cough is non-productive. There has been no fever. Associated symptoms include ear congestion, rhinorrhea and sore throat. Pertinent negatives include no chills, no ear pain, no shortness of breath and no wheezing. She has tried decongestants for the symptoms. The treatment provided no relief.  Sore Throat Pertinent negatives include no shortness of breath.    Past Medical History  Diagnosis Date  . Asthma   . Migraine   . Hypertension   . Acid reflux     Past Surgical History  Procedure Date  . Tubal ligation   . Cholecystectomy   . Hernia repair   . Rotator cuff repair     History reviewed. No pertinent family history.  History  Substance Use Topics  . Smoking status: Never Smoker   . Smokeless tobacco: Never Used  . Alcohol Use: No    OB History    Grav Para Term Preterm Abortions TAB SAB Ect Mult Living                  Review of Systems  Constitutional: Negative for fever and chills.  HENT: Positive for congestion, sore throat, rhinorrhea, postnasal drip and sinus pressure. Negative for ear pain.   Respiratory: Positive for cough. Negative for shortness of  breath and wheezing.     Allergies  Vicodin  Home Medications   Current Outpatient Rx  Name Route Sig Dispense Refill  . ESOMEPRAZOLE MAGNESIUM 40 MG PO CPDR Oral Take 1 capsule (40 mg total) by mouth daily. 14 capsule 0  . FEXOFENADINE-PSEUDOEPHED ER 180-240 MG PO TB24 Oral Take 1 tablet by mouth daily.    Marland Kitchen FLUTICASONE PROPIONATE 50 MCG/ACT NA SUSP Nasal Place 2 sprays into the nose daily. 16 g 2    BP 134/86  Pulse 94  Temp(Src) 98.1 F (36.7 C) (Oral)  Resp 22  SpO2 98%  LMP 11/24/2011  Physical Exam  Constitutional: She appears well-developed and well-nourished. No distress.  HENT:  Right Ear: Tympanic membrane, external ear and ear canal normal.  Left Ear: Tympanic membrane, external ear and ear canal normal.  Nose: Mucosal edema and rhinorrhea present. Right sinus exhibits maxillary sinus tenderness and frontal sinus tenderness. Left sinus exhibits maxillary sinus tenderness and frontal sinus tenderness.  Mouth/Throat: Oropharynx is clear and moist.  Cardiovascular: Normal rate and regular rhythm.   Pulmonary/Chest: Effort normal and breath sounds normal.    ED Course  Procedures (including critical care time)   Labs Reviewed  POCT RAPID STREP A ([facility] CARE ONLY)   No results found.   1. Allergic sinusitis       MDM  Strep neg        Marylene Land  Farrel Demark, NP 11/26/11 1654

## 2011-11-26 NOTE — ED Provider Notes (Signed)
Medical screening examination/treatment/procedure(s) were performed by non-physician practitioner and as supervising physician I was immediately available for consultation/collaboration.  Luiz Blare MD   Luiz Blare, MD 11/26/11 [phone]

## 2011-12-08 ENCOUNTER — Encounter (HOSPITAL_COMMUNITY): Payer: Self-pay | Admitting: Emergency Medicine

## 2011-12-08 ENCOUNTER — Emergency Department (HOSPITAL_COMMUNITY)
Admission: EM | Admit: 2011-12-08 | Discharge: 2011-12-09 | Disposition: A | Discharge status: Home / Self Care | Payer: No Typology Code available for payment source | Attending: Emergency Medicine | Admitting: Emergency Medicine

## 2011-12-08 DIAGNOSIS — I10 Essential (primary) hypertension: Secondary | ICD-10-CM | POA: Insufficient documentation

## 2011-12-08 DIAGNOSIS — J45909 Unspecified asthma, uncomplicated: Secondary | ICD-10-CM | POA: Insufficient documentation

## 2011-12-08 DIAGNOSIS — M545 Low back pain, unspecified: Secondary | ICD-10-CM | POA: Insufficient documentation

## 2011-12-08 DIAGNOSIS — Y92410 Unspecified street and highway as the place of occurrence of the external cause: Secondary | ICD-10-CM | POA: Insufficient documentation

## 2011-12-08 DIAGNOSIS — E669 Obesity, unspecified: Secondary | ICD-10-CM | POA: Insufficient documentation

## 2011-12-08 DIAGNOSIS — K219 Gastro-esophageal reflux disease without esophagitis: Secondary | ICD-10-CM | POA: Insufficient documentation

## 2011-12-08 DIAGNOSIS — M549 Dorsalgia, unspecified: Secondary | ICD-10-CM

## 2011-12-08 HISTORY — DX: Obesity, unspecified: E66.9

## 2011-12-08 HISTORY — DX: Gastro-esophageal reflux disease without esophagitis: K21.9
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
## 2011-12-09 MED ORDER — ACETAMINOPHEN-CODEINE #3 300-30 MG PO TABS: 1.0000 | ORAL_TABLET | Freq: Four times a day (QID) | ORAL | Status: AC | PRN
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
## 2011-12-09 NOTE — ED Provider Notes (Signed)
Medical screening examination/treatment/procedure(s) were performed by non-physician practitioner and as supervising physician I was immediately available for consultation/collaboration.   Lyanne Co, MD 12/09/11 [phone]
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
## 2011-12-29 ENCOUNTER — Emergency Department (HOSPITAL_COMMUNITY)
Admission: EM | Admit: 2011-12-29 | Discharge: 2011-12-29 | Disposition: A | Discharge status: Home / Self Care | Payer: BC Managed Care – PPO | Source: Home / Self Care | Attending: Emergency Medicine | Admitting: Emergency Medicine

## 2011-12-29 ENCOUNTER — Encounter (HOSPITAL_COMMUNITY): Payer: Self-pay

## 2011-12-29 DIAGNOSIS — R51 Headache: Secondary | ICD-10-CM

## 2011-12-29 MED ORDER — METOCLOPRAMIDE HCL 5 MG/ML IJ SOLN
10.0000 mg | Freq: Once | INTRAMUSCULAR | Status: AC
  Administered 2011-12-29: 10 mg via INTRAMUSCULAR

## 2011-12-29 MED ORDER — KETOROLAC TROMETHAMINE 60 MG/2ML IM SOLN
60.0000 mg | Freq: Once | INTRAMUSCULAR | Status: AC
  Administered 2011-12-29: 60 mg via INTRAMUSCULAR

## 2011-12-29 MED ORDER — METOCLOPRAMIDE HCL 5 MG/ML IJ SOLN
INTRAMUSCULAR | Status: AC
  Filled 2011-12-29: qty 2

## 2011-12-29 MED ORDER — DIPHENHYDRAMINE HCL 50 MG/ML IJ SOLN: 25.0000 mg | Freq: Once | INTRAMUSCULAR | Status: DC

## 2011-12-29 MED ORDER — DIPHENHYDRAMINE HCL 50 MG/ML IJ SOLN
INTRAMUSCULAR | Status: AC
  Filled 2011-12-29: qty 1

## 2011-12-29 MED ORDER — DIPHENHYDRAMINE HCL 50 MG/ML IJ SOLN
25.0000 mg | Freq: Once | INTRAMUSCULAR | Status: AC
  Administered 2011-12-29: 25 mg via INTRAMUSCULAR

## 2011-12-29 MED ORDER — KETOROLAC TROMETHAMINE 60 MG/2ML IM SOLN
INTRAMUSCULAR | Status: AC
  Filled 2011-12-29: qty 2

## 2011-12-29 NOTE — ED Provider Notes (Signed)
History     CSN: [id number]  Arrival date & time 12/29/11  1440   First MD Initiated Contact with Patient 12/29/11 1625      Chief Complaint  Patient presents with  . Headache    (Consider location/radiation/quality/duration/timing/severity/associated sxs/prior treatment) Patient is a 46 y.o. female presenting with headaches. The history is provided by the patient. No language interpreter was used.  Headache The primary symptoms include headaches. Episode onset: 3 days. The symptoms are worsening.  Medical issues do not include seizures, alcohol use or hypertension.  Pt complains of a headache and nausea.  Pt has had migraines in the past.  Pt request migrane cocktail  Past Medical History  Diagnosis Date  . Asthma   . Migraine   . Hypertension   . Acid reflux   . Obesity   . GERD (gastroesophageal reflux disease)     Past Surgical History  Procedure Date  . Tubal ligation   . Cholecystectomy   . Hernia repair   . Rotator cuff repair     No family history on file.  History  Substance Use Topics  . Smoking status: Never Smoker   . Smokeless tobacco: Never Used  . Alcohol Use: No    OB History    Grav Para Term Preterm Abortions TAB SAB Ect Mult Living                  Review of Systems  Neurological: Positive for headaches.  All other systems reviewed and are negative.    Allergies  Vicodin  Home Medications   Current Outpatient Rx  Name Route Sig Dispense Refill  . IBUPROFEN 200 MG PO TABS Oral Take 200 mg by mouth 2 (two) times daily as needed. For back pain    . ESOMEPRAZOLE MAGNESIUM 40 MG PO CPDR Oral Take 40 mg by mouth daily.    Marland Kitchen FEXOFENADINE-PSEUDOEPHED ER 180-240 MG PO TB24 Oral Take 1 tablet by mouth daily as needed. For allergy symptoms      BP 148/93  Pulse 66  Temp 98.2 F (36.8 C) (Oral)  Resp 18  SpO2 100%  LMP 12/22/2011  Physical Exam  Vitals reviewed. Constitutional: She is oriented to person, place, and time. She  appears well-developed and well-nourished.  HENT:  Head: Normocephalic and atraumatic.  Right Ear: External ear normal.  Left Ear: External ear normal.  Nose: Nose normal.  Mouth/Throat: Oropharynx is clear and moist.  Eyes: Conjunctivae and EOM are normal. Pupils are equal, round, and reactive to light.  Neck: Normal range of motion. Neck supple.  Cardiovascular: Normal rate, regular rhythm and normal heart sounds.   Pulmonary/Chest: Effort normal and breath sounds normal.  Abdominal: Soft.  Musculoskeletal: Normal range of motion.  Neurological: She is alert and oriented to person, place, and time. She has normal reflexes.  Skin: Skin is warm.  Psychiatric: She has a normal mood and affect.    ED Course  Procedures (including critical care time)  Labs Reviewed - No data to display No results found.   No diagnosis found.    MDM          Elson Areas, PA 12/29/11 [phone]

## 2011-12-29 NOTE — ED Provider Notes (Signed)
Medical screening examination/treatment/procedure(s) were performed by non-physician practitioner and as supervising physician I was immediately available for consultation/collaboration.  Tomi Paddock, M.D.   Zayveon Raschke C Tila Millirons, MD 12/29/11 2059 

## 2011-12-29 NOTE — ED Notes (Signed)
C/o parietal and occipital headache for 3 days.  Reports vomited x1 this am.  States she was diagnosed with migraines at [facility] ED in Feb and March and referred to [facility]  Reports she did not go to [facility] due to lack of insurance.  Has  Been taking aleve and ibuprofen.

## 2011-12-29 NOTE — Discharge Instructions (Signed)

## 2012-01-08 ENCOUNTER — Encounter (HOSPITAL_COMMUNITY): Payer: Self-pay

## 2012-01-08 ENCOUNTER — Emergency Department (INDEPENDENT_AMBULATORY_CARE_PROVIDER_SITE_OTHER)
Admission: EM | Admit: 2012-01-08 | Discharge: 2012-01-08 | Disposition: A | Discharge status: Home / Self Care | Payer: BC Managed Care – PPO | Source: Home / Self Care | Attending: Emergency Medicine | Admitting: Emergency Medicine

## 2012-01-08 DIAGNOSIS — K529 Noninfective gastroenteritis and colitis, unspecified: Secondary | ICD-10-CM

## 2012-01-08 DIAGNOSIS — R1110 Vomiting, unspecified: Secondary | ICD-10-CM

## 2012-01-08 DIAGNOSIS — R112 Nausea with vomiting, unspecified: Secondary | ICD-10-CM

## 2012-01-08 DIAGNOSIS — K5289 Other specified noninfective gastroenteritis and colitis: Secondary | ICD-10-CM

## 2012-01-08 MED ORDER — DIPHENOXYLATE-ATROPINE 2.5-0.025 MG PO TABS: 1.0000 | ORAL_TABLET | Freq: Four times a day (QID) | ORAL | Status: AC | PRN

## 2012-01-08 MED ORDER — ONDANSETRON HCL 4 MG PO TABS: 4.0000 mg | ORAL_TABLET | Freq: Three times a day (TID) | ORAL | Status: AC | PRN

## 2012-01-08 NOTE — ED Provider Notes (Signed)
History     CSN: [id number]  Arrival date & time 01/08/12  1611   First MD Initiated Contact with Patient 01/08/12 1631      Chief Complaint  Patient presents with  . Abdominal Pain    (Consider location/radiation/quality/duration/timing/severity/associated sxs/prior treatment) HPI Comments: Patient presents today to urgent care complaining of diarrheas and vomiting and colicky abdominal pain since yesterday. He describes one of her grandkids was experiencing some type of "stomach bug" he has express some chills but no tactile fever so far. Denies any pain at rest. Have had about 3-4 liquidy episodes of diarrhea no blood in her stools. And also 2-3 episodes of vomiting including today in a parking lot of her job. Patient has not tried anything over-the-counter for her symptoms.   Patient is a 46 y.o. female presenting with abdominal pain. The history is provided by the patient.  Abdominal Pain The primary symptoms of the illness include abdominal pain, nausea, vomiting and diarrhea. The primary symptoms of the illness do not include fever, fatigue, shortness of breath, hematochezia, dysuria, vaginal discharge or vaginal bleeding. The current episode started 13 to 24 hours ago. The onset of the illness was sudden.  Additional symptoms associated with the illness include chills. Symptoms associated with the illness do not include diaphoresis or constipation.    Past Medical History  Diagnosis Date  . Asthma   . Migraine   . Hypertension   . Acid reflux   . Obesity   . GERD (gastroesophageal reflux disease)     Past Surgical History  Procedure Date  . Tubal ligation   . Cholecystectomy   . Hernia repair   . Rotator cuff repair     History reviewed. No pertinent family history.  History  Substance Use Topics  . Smoking status: Never Smoker   . Smokeless tobacco: Never Used  . Alcohol Use: No    OB History    Grav Para Term Preterm Abortions TAB SAB Ect Mult Living              Review of Systems  Constitutional: Positive for chills, activity change and appetite change. Negative for fever, diaphoresis and fatigue.  Respiratory: Negative for shortness of breath, wheezing and stridor.   Gastrointestinal: Positive for nausea, vomiting, abdominal pain, diarrhea and abdominal distention. Negative for constipation, blood in stool, hematochezia, anal bleeding and rectal pain.  Genitourinary: Negative for dysuria, vaginal bleeding and vaginal discharge.  Neurological: Negative for dizziness.    Allergies  Vicodin  Home Medications   Current Outpatient Rx  Name Route Sig Dispense Refill  . DIPHENOXYLATE-ATROPINE 2.5-0.025 MG PO TABS Oral Take 1 tablet by mouth 4 (four) times daily as needed for diarrhea or loose stools. 15 tablet 0  . ESOMEPRAZOLE MAGNESIUM 40 MG PO CPDR Oral Take 40 mg by mouth daily.    Marland Kitchen FEXOFENADINE-PSEUDOEPHED ER 180-240 MG PO TB24 Oral Take 1 tablet by mouth daily as needed. For allergy symptoms    . IBUPROFEN 200 MG PO TABS Oral Take 200 mg by mouth 2 (two) times daily as needed. For back pain    . ONDANSETRON HCL 4 MG PO TABS Oral Take 1 tablet (4 mg total) by mouth every 8 (eight) hours as needed for nausea. 20 tablet 0    BP 112/80  Pulse 74  Temp 98.6 F (37 C) (Oral)  Resp 20  SpO2 98%  LMP 12/22/2011  Physical Exam  Vitals reviewed. Constitutional: Vital signs are normal. She appears well-developed.  Non-toxic appearance. She does not have a sickly appearance. She does not appear ill. No distress.  HENT:  Mouth/Throat: No oropharyngeal exudate.  Eyes: Conjunctivae are normal. No scleral icterus.  Neck: Neck supple.  Pulmonary/Chest: Effort normal and breath sounds normal.  Abdominal: Soft. Bowel sounds are normal. She exhibits no distension and no mass. There is hepatosplenomegaly. There is no tenderness. There is no rigidity, no rebound, no guarding, no CVA tenderness and negative Murphy's sign. No hernia. Hernia  confirmed negative in the ventral area.    Skin: Skin is warm. No rash noted. She is not diaphoretic. No erythema. No pallor.    ED Course  Procedures (including critical care time)  Labs Reviewed - No data to display No results found.   1. Vomiting   2. Nausea & vomiting   3. Gastroenteritis       MDM  24 hours of sudden onset of gastrointestinal symptoms. Normal and soft abdomen. Patient is not clinically dehydrated and afebrile. Symptomatic management for the next 48 hours encourage and discuss symptoms of what were further evaluation in the emergency department. She understands and agrees with treatment plan and followup care as necessary        Jimmie Molly, MD 01/08/12 1807

## 2012-01-08 NOTE — Discharge Instructions (Signed)
  Further symptoms or no improvement further vomiting should go to the emergency  Further evaluation.   B.R.A.T. Diet Your doctor has recommended the B.R.A.T. diet for you or your child until the condition improves. This is often used to help control diarrhea and vomiting symptoms. If you or your child can tolerate clear liquids, you may have:  Bananas.   Rice.   Applesauce.   Toast (and other simple starches such as crackers, potatoes, noodles).  Be sure to avoid dairy products, meats, and fatty foods until symptoms are better. Fruit juices such as apple, grape, and prune juice can make diarrhea worse. Avoid these. Continue this diet for 2 days or as instructed by your caregiver. Document Released: 06/26/2005 Document Revised: 06/15/2011 Document Reviewed: 12/13/2006 [facility] Patient Information 2012 [place], [place].

## 2012-01-08 NOTE — ED Notes (Addendum)
C/o vomiting and diarrhea since yesterday (grandson is ill) HA today, vomited in parking lot at work and was sent home from work. States she has an appt to HA doctor mid July

## 2012-01-18 ENCOUNTER — Encounter (HOSPITAL_COMMUNITY): Payer: Self-pay | Admitting: *Deleted

## 2012-01-18 ENCOUNTER — Emergency Department (HOSPITAL_COMMUNITY)
Admission: EM | Admit: 2012-01-18 | Discharge: 2012-01-18 | Disposition: A | Discharge status: Home / Self Care | Payer: BC Managed Care – PPO | Source: Home / Self Care | Attending: Emergency Medicine | Admitting: Emergency Medicine

## 2012-01-18 DIAGNOSIS — G43909 Migraine, unspecified, not intractable, without status migrainosus: Secondary | ICD-10-CM

## 2012-01-18 MED ORDER — ONDANSETRON 4 MG PO TBDP
ORAL_TABLET | ORAL | Status: AC
  Filled 2012-01-18: qty 1

## 2012-01-18 MED ORDER — KETOROLAC TROMETHAMINE 60 MG/2ML IM SOLN
60.0000 mg | Freq: Once | INTRAMUSCULAR | Status: AC
  Administered 2012-01-18: 60 mg via INTRAMUSCULAR

## 2012-01-18 MED ORDER — KETOROLAC TROMETHAMINE 10 MG PO TABS: 10.0000 mg | ORAL_TABLET | Freq: Three times a day (TID) | ORAL | Status: AC | PRN

## 2012-01-18 MED ORDER — KETOROLAC TROMETHAMINE 60 MG/2ML IM SOLN
INTRAMUSCULAR | Status: AC
  Filled 2012-01-18: qty 2

## 2012-01-18 MED ORDER — ONDANSETRON 4 MG PO TBDP
4.0000 mg | ORAL_TABLET | Freq: Once | ORAL | Status: AC
  Administered 2012-01-18: 4 mg via ORAL

## 2012-01-18 NOTE — ED Provider Notes (Signed)
History     CSN: [id number]  Arrival date & time 01/18/12  1139   First MD Initiated Contact with Patient 01/18/12 1142      Chief Complaint  Patient presents with  . Migraine    (Consider location/radiation/quality/duration/timing/severity/associated sxs/prior treatment) HPI Comments: Patient reports that since yesterday she's been having nausea and vomited with a headache resembling one of her migraine headaches. Light is bothering her a bit as well. She called a headache wellness [facility] but they cannot see her today was advised to come here for migraine pain control.  She denies any other symptoms such as fevers, abdominal pain, or numbness paresthesias or weakness of any upper or lower extremity. No visual changes.  The history is provided by the patient.    Past Medical History  Diagnosis Date  . Asthma   . Migraine   . Hypertension   . Acid reflux   . Obesity   . GERD (gastroesophageal reflux disease)     Past Surgical History  Procedure Date  . Tubal ligation   . Cholecystectomy   . Hernia repair   . Rotator cuff repair     History reviewed. No pertinent family history.  History  Substance Use Topics  . Smoking status: Never Smoker   . Smokeless tobacco: Never Used  . Alcohol Use: No    OB History    Grav Para Term Preterm Abortions TAB SAB Ect Mult Living                  Review of Systems  Constitutional: Negative for fever, chills, diaphoresis, activity change, appetite change and fatigue.  Eyes: Positive for photophobia. Negative for visual disturbance.  Respiratory: Negative for shortness of breath.   Musculoskeletal: Negative for myalgias.  Skin: Negative for color change, rash and wound.  Neurological: Positive for headaches. Negative for dizziness, seizures, speech difficulty, weakness, light-headedness and numbness.    Allergies  Vicodin and Vicodin  Home Medications   Current Outpatient Rx  Name Route Sig Dispense Refill  .  DIPHENOXYLATE-ATROPINE 2.5-0.025 MG PO TABS Oral Take 1 tablet by mouth 4 (four) times daily as needed for diarrhea or loose stools. 15 tablet 0  . ESOMEPRAZOLE MAGNESIUM 40 MG PO CPDR Oral Take 40 mg by mouth daily.    Marland Kitchen FEXOFENADINE-PSEUDOEPHED ER 180-240 MG PO TB24 Oral Take 1 tablet by mouth daily as needed. For allergy symptoms    . IBUPROFEN 200 MG PO TABS Oral Take 200 mg by mouth 2 (two) times daily as needed. For back pain      BP 141/90  Pulse 82  Temp 98 F (36.7 C) (Oral)  Resp 16  SpO2 99%  LMP 01/18/2012  Physical Exam  Nursing note and vitals reviewed. Constitutional: She is oriented to person, place, and time. Vital signs are normal. She appears well-developed and well-nourished.  Non-toxic appearance. She does not have a sickly appearance. She does not appear ill. No distress.  HENT:  Head: Normocephalic.  Mouth/Throat: No oropharyngeal exudate.  Eyes: Conjunctivae are normal.  Neck: Neck supple.  Pulmonary/Chest: Effort normal and breath sounds normal.  Musculoskeletal: Normal range of motion.  Neurological: She is alert and oriented to person, place, and time. She has normal strength. No cranial nerve deficit or sensory deficit. She displays a negative Romberg sign.  Skin: Skin is warm.    ED Course  Procedures (including critical care time)  Labs Reviewed - No data to display No results found.   No diagnosis  found.    MDM  Migraine headache, patient looks mildly uncomfortable. In photophobic. Afebrile and no further neurological symptoms to suggest or suspect a cerebrovascular event. Patient has been injectable Toradol and provided with soap and apply to go home and rest and dark environment quite given a doctor's note for 24 hours. Patient agrees with treatment plan and followup care at the wellness Center which she already has an appointment.       Jimmie Molly, MD 01/18/12 [phone]

## 2012-01-18 NOTE — ED Notes (Signed)
Pt  Reports symptoms  Of  Headache  With  Nausea     Vomiting        And  Photophobia    Which  Symptoms  Starting  As  Of  Yesterday           She  Is  Awake   Alert       As  Well  As  Oriented

## 2012-04-13 ENCOUNTER — Emergency Department (HOSPITAL_COMMUNITY)
Admission: EM | Admit: 2012-04-13 | Discharge: 2012-04-13 | Disposition: A | Discharge status: Home / Self Care | Payer: BC Managed Care – PPO | Source: Home / Self Care | Attending: Emergency Medicine | Admitting: Emergency Medicine

## 2012-04-13 ENCOUNTER — Encounter (HOSPITAL_COMMUNITY): Payer: Self-pay | Admitting: Emergency Medicine

## 2012-04-13 DIAGNOSIS — J209 Acute bronchitis, unspecified: Secondary | ICD-10-CM

## 2012-04-13 DIAGNOSIS — J040 Acute laryngitis: Secondary | ICD-10-CM

## 2012-04-13 DIAGNOSIS — J019 Acute sinusitis, unspecified: Secondary | ICD-10-CM

## 2012-04-13 MED ORDER — BENZONATATE 200 MG PO CAPS: 200.0000 mg | ORAL_CAPSULE | Freq: Three times a day (TID) | ORAL | Status: DC | PRN

## 2012-04-13 MED ORDER — AMOXICILLIN 500 MG PO CAPS: 1000.0000 mg | ORAL_CAPSULE | Freq: Three times a day (TID) | ORAL | Status: DC

## 2012-04-13 MED ORDER — PREDNISONE 5 MG PO KIT: 1.0000 | PACK | Freq: Every day | ORAL | Status: DC

## 2012-04-13 NOTE — ED Provider Notes (Signed)
Chief Complaint  Patient presents with  . Sore Throat  . Recurrent Sinusitis    History of Present Illness:   [name] is a 46 year old female who has had a one-week history of sore throat, hoarseness, postnasal drip, cough productive yellow-green sputum, wheezing, and chest tightness. She has had a history of asthma for about the past 10 years and uses albuterol on an as-needed basis. She also has nasal congestion with yellow-green, bloody drainage, feels off balance, and sinus pressure and headache. She had a temperature 101 at the onset of her symptoms but none recently. She also feels slightly nauseated but has had no vomiting.  Review of Systems:  Other than noted above, the patient denies any of the following symptoms. Systemic:  No fever, chills, sweats, fatigue, myalgias, headache, or anorexia. Eye:  No redness, pain or drainage. ENT:  No earache, ear congestion, nasal congestion, sneezing, rhinorrhea, sinus pressure, sinus pain, post nasal drip, or sore throat. Lungs:  No cough, sputum production, wheezing, shortness of breath, or chest pain. GI:  No abdominal pain, nausea, vomiting, or diarrhea.  PMFSH:  Past medical history, family history, social history, meds, and allergies were reviewed.  Physical Exam:   Vital signs:  BP 134/94  Pulse 71  Temp 98.1 F (36.7 C) (Oral)  Resp 16  SpO2 98%  LMP 04/11/2012 General:  Alert, in no distress. Eye:  No conjunctival injection or drainage. Lids were normal. ENT:  TMs and canals were normal, without erythema or inflammation.  Nasal mucosa was clear and uncongested, without drainage.  Mucous membranes were moist.  Pharynx was clear, without exudate or drainage.  There were no oral ulcerations or lesions. Neck:  Supple, no adenopathy, tenderness or mass. Lungs:  No respiratory distress.  Lungs were clear to auscultation, without wheezes, rales or rhonchi.  Breath sounds were clear and equal bilaterally.  Heart:  Regular rhythm, without  gallops, murmers or rubs. Skin:  Clear, warm, and dry, without rash or lesions.  Assessment:  The primary encounter diagnosis was Laryngitis. Diagnoses of Acute sinusitis and Acute bronchitis were also pertinent to this visit.  Plan:   1.  The following meds were prescribed:   New Prescriptions   AMOXICILLIN (AMOXIL) 500 MG CAPSULE    Take 2 capsules (1,000 mg total) by mouth 3 (three) times daily.   BENZONATATE (TESSALON) 200 MG CAPSULE    Take 1 capsule (200 mg total) by mouth 3 (three) times daily as needed for cough.   PREDNISONE 5 MG KIT    Take 1 kit (5 mg total) by mouth daily after breakfast. Prednisone 5 mg 6 day dosepack.  Take as directed.   2.  The patient was instructed in symptomatic care and handouts were given. 3.  The patient was told to return if becoming worse in any way, if no better in 3 or 4 days, and given some red flag symptoms that would indicate earlier return.   Reuben Likes, MD 04/13/12 2106

## 2012-04-13 NOTE — ED Notes (Signed)
Sore throat with lose of voice. Sinus drainage with nasal pressure and headache x 5 days. Pt states that she ran a fever of 101 for the first couple of days.  Fever is relieved with tylenol, last dose was last night at 11:30 p.m

## 2012-05-16 ENCOUNTER — Emergency Department (HOSPITAL_COMMUNITY): Payer: BC Managed Care – PPO

## 2012-05-16 ENCOUNTER — Emergency Department (HOSPITAL_COMMUNITY)
Admission: EM | Admit: 2012-05-16 | Discharge: 2012-05-16 | Disposition: A | Discharge status: Home / Self Care | Payer: BC Managed Care – PPO | Attending: Emergency Medicine | Admitting: Emergency Medicine

## 2012-05-16 DIAGNOSIS — Y9301 Activity, walking, marching and hiking: Secondary | ICD-10-CM | POA: Insufficient documentation

## 2012-05-16 DIAGNOSIS — Z79899 Other long term (current) drug therapy: Secondary | ICD-10-CM | POA: Insufficient documentation

## 2012-05-16 DIAGNOSIS — Y92410 Unspecified street and highway as the place of occurrence of the external cause: Secondary | ICD-10-CM | POA: Insufficient documentation

## 2012-05-16 DIAGNOSIS — J45909 Unspecified asthma, uncomplicated: Secondary | ICD-10-CM | POA: Insufficient documentation

## 2012-05-16 DIAGNOSIS — X500XXA Overexertion from strenuous movement or load, initial encounter: Secondary | ICD-10-CM | POA: Insufficient documentation

## 2012-05-16 DIAGNOSIS — S93409A Sprain of unspecified ligament of unspecified ankle, initial encounter: Secondary | ICD-10-CM | POA: Insufficient documentation

## 2012-05-16 DIAGNOSIS — E669 Obesity, unspecified: Secondary | ICD-10-CM | POA: Insufficient documentation

## 2012-05-16 DIAGNOSIS — K219 Gastro-esophageal reflux disease without esophagitis: Secondary | ICD-10-CM | POA: Insufficient documentation

## 2012-05-16 DIAGNOSIS — I10 Essential (primary) hypertension: Secondary | ICD-10-CM | POA: Insufficient documentation

## 2012-05-16 DIAGNOSIS — Z888 Allergy status to other drugs, medicaments and biological substances status: Secondary | ICD-10-CM | POA: Insufficient documentation

## 2012-05-16 NOTE — ED Provider Notes (Signed)
History   This chart was scribed for Lindsey Quarry, MD by [name]. This patient was seen in room TR10C/TR10C and the patient's care was started at 1:03 PM    CSN: [id number]  Arrival date & time 05/16/12  1229   First MD Initiated Contact with Patient 05/16/12 1253      Chief Complaint  Patient presents with  . Ankle Pain    (Consider location/radiation/quality/duration/timing/severity/associated sxs/prior treatment) The history is provided by the patient. No language interpreter was used.   [name] is a 46 y.o. female who presents to the Emergency Department complaining of constant, moderate, gradually worsening, non-radiating right ankle pain after stumbling when stepping off of street-side curb.  Pt denies fall or any further injuries as a result.  Pt is not currently nauseated.  Pt has h/o asthma, HTN, and GERD.  Pt denies tobacco and alcohol use.    Past Medical History  Diagnosis Date  . Asthma   . Migraine   . Hypertension   . Acid reflux   . Obesity   . GERD (gastroesophageal reflux disease)     Past Surgical History  Procedure Date  . Tubal ligation   . Cholecystectomy   . Hernia repair   . Rotator cuff repair     No family history on file.  History  Substance Use Topics  . Smoking status: Never Smoker   . Smokeless tobacco: Never Used  . Alcohol Use: No    No OB history provided.  Review of Systems  Gastrointestinal: Negative for nausea.  Musculoskeletal:       Ankle pain.  Skin: Negative for wound.    Allergies  Vicodin  Home Medications   Current Outpatient Rx  Name  Route  Sig  Dispense  Refill  . ESOMEPRAZOLE MAGNESIUM 40 MG PO CPDR   Oral   Take 40 mg by mouth daily.         . TOPIRAMATE 25 MG PO TABS   Oral   Take 25 mg by mouth at bedtime.           BP 146/97  Pulse 79  Temp 98.1 F (36.7 C) (Oral)  Resp 18  SpO2 98%  Physical Exam  Nursing note and vitals reviewed. Constitutional: She is oriented to  person, place, and time. She appears well-developed and well-nourished.  HENT:  Head: Normocephalic and atraumatic.  Eyes: Conjunctivae normal and EOM are normal.  Neck: Normal range of motion. No tracheal deviation present.  Musculoskeletal: She exhibits tenderness.       Right DP pulse strong and toes are pink. Full active ROM of right [name] with no ligamentous laxity. Diffuse medial, posterior, and lateral tenderness of right ankle.  Mild swelling of right superior anterior foot.  Sensation neurovascularly intact distal to injury.  Neurological: She is alert and oriented to person, place, and time.  Skin: Skin is warm and dry.  Psychiatric: She has a normal mood and affect.    ED Course  Procedures (including critical care time) DIAGNOSTIC STUDIES: Oxygen Saturation is 98% on room air, normal by my interpretation.    COORDINATION OF CARE: 1:07 PM- Patient informed of clinical course, understands medical decision-making process, and agrees with plan.  Ordered right ankle XR.     Labs Reviewed - No data to display Dg Ankle Complete Right  05/16/2012  *RADIOLOGY REPORT*  Clinical Data: Medial right ankle pain following a fall.  RIGHT ANKLE - COMPLETE 3+ VIEW  Comparison: Right  foot dated 08/15/2010.  Findings: Mild diffuse soft tissue swelling.  No fracture, dislocation or effusion seen.  Moderate sized posterior calcaneal spur.  Dorsal talonavicular spur formation.  IMPRESSION: No fracture.  Degenerative changes.   Original Report Authenticated By: Beckie Salts, M.D.      No diagnosis found.    MDM  Ankle sprain/foot contusion.  Patient advised regarding treatment and f/u prn.   I personally performed the services described in this documentation, which was scribed in my presence. The recorded information has been reviewed and is accurate.       Lindsey Quarry, MD 05/19/12 [phone]

## 2012-05-16 NOTE — Progress Notes (Signed)
Orthopedic Tech Progress Note Patient Details:  [name] Jan 01, 1966 [id number]  Ortho Devices Type of Ortho Device: Ace wrap;Crutches Ortho Device/Splint Location: (R) LE Ortho Device/Splint Interventions: Application   Jennye Moccasin 05/16/2012, 2:37 PM

## 2012-05-16 NOTE — ED Notes (Signed)
Per ems- pt twisted right ankle while walking down stairs 4hrs ago. No deformity noted, ankle is painful and swollen. Pulses present bilaterally. Bp-140palpated HR-78 R-18 Pain 9/10  o2-99% on RA.

## 2012-06-15 ENCOUNTER — Encounter (HOSPITAL_COMMUNITY): Payer: Self-pay | Admitting: *Deleted

## 2012-06-15 ENCOUNTER — Emergency Department (INDEPENDENT_AMBULATORY_CARE_PROVIDER_SITE_OTHER): Payer: BC Managed Care – PPO

## 2012-06-15 ENCOUNTER — Emergency Department (HOSPITAL_COMMUNITY)
Admission: EM | Admit: 2012-06-15 | Discharge: 2012-06-15 | Disposition: A | Discharge status: Home / Self Care | Payer: BC Managed Care – PPO | Source: Home / Self Care | Attending: Family Medicine | Admitting: Family Medicine

## 2012-06-15 DIAGNOSIS — IMO0002 Reserved for concepts with insufficient information to code with codable children: Secondary | ICD-10-CM

## 2012-06-15 DIAGNOSIS — S86911A Strain of unspecified muscle(s) and tendon(s) at lower leg level, right leg, initial encounter: Secondary | ICD-10-CM

## 2012-06-15 NOTE — ED Provider Notes (Signed)
History     CSN: [id number]  Arrival date & time 06/15/12  1438   First MD Initiated Contact with Patient 06/15/12 1627      Chief Complaint  Patient presents with  . Fall  . Knee Injury    (Consider location/radiation/quality/duration/timing/severity/associated sxs/prior treatment) Patient is a 46 y.o. female presenting with knee pain. The history is provided by the patient.  Knee Pain This is a new problem. The current episode started 3 to 5 hours ago (fell down multiple steps today with right knee pain, sl benign  soreness to right wrist.). The problem has not changed since onset.Pertinent negatives include no chest pain and no abdominal pain. The symptoms are aggravated by bending.    Past Medical History  Diagnosis Date  . Asthma   . Migraine   . Hypertension   . Acid reflux   . Obesity   . GERD (gastroesophageal reflux disease)     Past Surgical History  Procedure Date  . Tubal ligation   . Cholecystectomy   . Hernia repair   . Rotator cuff repair     Family History  Problem Relation Age of Onset  . Family history unknown: Yes    History  Substance Use Topics  . Smoking status: Never Smoker   . Smokeless tobacco: Never Used  . Alcohol Use: No    OB History    Grav Para Term Preterm Abortions TAB SAB Ect Mult Living                  Review of Systems  Constitutional: Negative.   HENT: Negative for neck pain.   Cardiovascular: Negative for chest pain and leg swelling.  Gastrointestinal: Negative for abdominal pain.  Musculoskeletal: Positive for myalgias and gait problem. Negative for back pain and joint swelling.  Skin: Negative.     Allergies  Vicodin  Home Medications   Current Outpatient Rx  Name  Route  Sig  Dispense  Refill  . ESOMEPRAZOLE MAGNESIUM 40 MG PO CPDR   Oral   Take 40 mg by mouth daily.         . TOPIRAMATE 25 MG PO TABS   Oral   Take 25 mg by mouth at bedtime.           BP 134/79  Pulse 76  Temp 98 F  (36.7 C) (Oral)  Resp 17  SpO2 99%  LMP 05/10/2012  Physical Exam  Nursing note and vitals reviewed. Constitutional: She is oriented to person, place, and time. She appears well-developed and well-nourished.  HENT:  Head: Normocephalic and atraumatic.  Musculoskeletal: She exhibits tenderness.       Right knee: She exhibits normal range of motion, no swelling, no effusion, no ecchymosis, normal alignment and no LCL laxity. No medial joint line, no lateral joint line and no patellar tendon tenderness noted.       Legs: Neurological: She is alert and oriented to person, place, and time.  Skin: Skin is warm and dry.    ED Course  Procedures (including critical care time)  Labs Reviewed - No data to display Dg Knee Complete 4 Views Right  06/15/2012  *RADIOLOGY REPORT*  Clinical Data: Fall.  Right knee injury.  Anterior knee pain.  RIGHT KNEE - COMPLETE 4+ VIEW  Comparison: Right knee radiographs 06/12/2008.  Findings: Medial and lateral osteophytes are slightly more prominent than on the prior exam.  Patellofemoral degenerative changes have progressed as well.  A small joint effusion is present.  No acute osseous abnormality is seen.  IMPRESSION:  1.  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 2.  Small joint effusion. 3.  No acute osseous abnormality.   Original Report Authenticated By: Marin Roberts, M.D.      1. Strain of right knee and leg       MDM  X-rays reviewed and report per radiologist.         Linna Hoff, MD 06/15/12 1730

## 2012-06-15 NOTE — ED Notes (Signed)
Pt reports falling today from the 5th step and injuring right knee, hearing popping noise when she landed

## 2012-07-15 ENCOUNTER — Emergency Department (HOSPITAL_COMMUNITY)
Admission: EM | Admit: 2012-07-15 | Discharge: 2012-07-15 | Disposition: A | Discharge status: Home / Self Care | Payer: BC Managed Care – PPO | Source: Home / Self Care | Attending: Emergency Medicine | Admitting: Emergency Medicine

## 2012-07-15 ENCOUNTER — Encounter (HOSPITAL_COMMUNITY): Payer: Self-pay | Admitting: Emergency Medicine

## 2012-07-15 DIAGNOSIS — K2970 Gastritis, unspecified, without bleeding: Secondary | ICD-10-CM

## 2012-07-15 DIAGNOSIS — G4700 Insomnia, unspecified: Secondary | ICD-10-CM

## 2012-07-15 DIAGNOSIS — K2990 Gastroduodenitis, unspecified, without bleeding: Secondary | ICD-10-CM

## 2012-07-15 LAB — POCT I-STAT, CHEM 8
BUN: 8 mg/dL (ref 6–23)
Calcium, Ion: 1.23 mmol/L (ref 1.12–1.23)
Chloride: 108 mEq/L (ref 96–112)
Creatinine, Ser: 0.8 mg/dL (ref 0.50–1.10)
Glucose, Bld: 105 mg/dL — ABNORMAL HIGH (ref 70–99)
HCT: 39 % (ref 36.0–46.0)
Hemoglobin: 13.3 g/dL (ref 12.0–15.0)
Potassium: 3.7 mEq/L (ref 3.5–5.1)
Sodium: 142 mEq/L (ref 135–145)
TCO2: 25 mmol/L (ref 0–100)

## 2012-07-15 LAB — POCT H PYLORI SCREEN: H. PYLORI SCREEN, POC: NEGATIVE

## 2012-07-15 MED ORDER — ONDANSETRON 4 MG PO TBDP
8.0000 mg | ORAL_TABLET | Freq: Once | ORAL | Status: AC
  Administered 2012-07-15: 8 mg via ORAL

## 2012-07-15 MED ORDER — ONDANSETRON 4 MG PO TBDP
ORAL_TABLET | ORAL | Status: AC
Start: 2012-07-15 — End: 2012-07-15
  Filled 2012-07-15: qty 1

## 2012-07-15 MED ORDER — ONDANSETRON 8 MG PO TBDP: 8.0000 mg | ORAL_TABLET | Freq: Three times a day (TID) | ORAL | Status: DC | PRN

## 2012-07-15 MED ORDER — ONDANSETRON 4 MG PO TBDP
ORAL_TABLET | ORAL | Status: AC
  Filled 2012-07-15: qty 1

## 2012-07-15 MED ORDER — ZOLPIDEM TARTRATE 5 MG PO TABS: 5.0000 mg | ORAL_TABLET | Freq: Every evening | ORAL | Status: DC | PRN

## 2012-07-15 MED ORDER — SUCRALFATE 1 G PO TABS: 1.0000 g | ORAL_TABLET | Freq: Four times a day (QID) | ORAL | Status: DC

## 2012-07-15 MED ORDER — ONDANSETRON 8 MG PO TBDP
8.0000 mg | ORAL_TABLET | Freq: Three times a day (TID) | ORAL | Status: DC | PRN
Start: 2012-07-15 — End: 2012-07-15

## 2012-07-15 NOTE — ED Provider Notes (Signed)
Chief Complaint  Patient presents with  . Insomnia    History of Present Illness:    The patient is a 47 year old female who presents tonight with problems of nausea, vomiting, and insomnia. She found out that her brother passed away suddenly on July 17, 2023. The symptoms have been going on since that. The patient states she's felt nauseated and has thrown up everything she's eaten. She denies any hematemesis, coffee-ground emesis, or bilious emesis. She has not have any abdominal or chest pain. She denies any constipation, diarrhea, hematochezia, or melena. She's felt mildly anxious and depressed. She's also not sleeping well at night. She estimates she only gets about an hour and a half of sleep per night. She has trouble falling asleep and staying asleep. Her sleep is fitful. She states she has trouble turning her mind off.  Review of Systems:  Other than noted above, the patient denies any of the following symptoms: Constitutional:  No fever, chills, fatigue, weight loss or anorexia. Lungs:  No cough or shortness of breath. Heart:  No chest pain, palpitations, syncope or edema.  No cardiac history. Abdomen:  No nausea, vomiting, hematememesis, melena, diarrhea, or hematochezia. GU:  No dysuria, frequency, urgency, or hematuria. Gyn:  No vaginal discharge, itching, abnormal bleeding, dyspareunia, or pelvic pain.  PMFSH:  Past medical history, family history, social history, meds, and allergies were reviewed along with nurse's notes.  No prior abdominal surgeries, past history of GI problems, STDs or GYN problems.  No history of aspirin or NSAID use.  No excessive alcohol intake.  Physical Exam:   Vital signs:  BP 165/106  Pulse 85  Temp 98.2 F (36.8 C) (Oral)  Resp 18  SpO2 100%  LMP 07/01/2012 Filed Vitals:   07/15/12 2004 07/15/12 2037 Supine  07/15/12 2039 Sitting  07/15/12 2041 Standing   BP: 131/84 141/93 164/103 165/106  Pulse: 83 79 82 85  Temp: 98.2 F (36.8 C)       TempSrc: Oral     Resp: 18     SpO2: 100%       Gen:  Alert, oriented, in no distress. Eyes: PERRLA, no scleral icterus. ENT: Mucous membranes moist, no intraoral lesions, pharynx clear. Neck: Supple, no adenopathy. Lungs:  Breath sounds clear and equal bilaterally.  No wheezes, rales or rhonchi. Heart:  Regular rhythm.  No gallops or murmers.   Abdomen:  Soft, flat, nondistended. There is no tenderness to palpation, guarding, or rebound. No organomegaly or mass. Bowel sounds are normally active. Extremities: Pulses full, good turgor, good capillary refill. Skin:  Clear, warm and dry.  No rash.  Labs:   Results for orders placed during the hospital encounter of 07/15/12  POCT H PYLORI SCREEN      Component Value Range   H. PYLORI SCREEN, POC NEGATIVE  NEGATIVE  POCT I-STAT, CHEM 8      Component Value Range   Sodium 142  135 - 145 mEq/L   Potassium 3.7  3.5 - 5.1 mEq/L   Chloride 108  96 - 112 mEq/L   BUN 8  6 - 23 mg/dL   Creatinine, Ser 1.61  0.50 - 1.10 mg/dL   Glucose, Bld 096 (*) 70 - 99 mg/dL   Calcium, Ion 0.45  4.09 - 1.23 mmol/L   TCO2 25  0 - 100 mmol/L   Hemoglobin 13.3  12.0 - 15.0 g/dL   HCT 81.1  91.4 - 78.2 %    Course in Urgent Care Center:  She was given Zofran ODT followed by some Gatorade which she was able to keep down without any difficulty.  Assessment:  The primary encounter diagnosis was Gastritis. A diagnosis of Insomnia was also pertinent to this visit.  I think this is all due to her recent death in the family.  Plan:   1.  The following meds were prescribed:   New Prescriptions   ONDANSETRON (ZOFRAN ODT) 8 MG DISINTEGRATING TABLET    Take 1 tablet (8 mg total) by mouth every 8 (eight) hours as needed for nausea.   SUCRALFATE (CARAFATE) 1 G TABLET    Take 1 tablet (1 g total) by mouth 4 (four) times daily.   ZOLPIDEM (AMBIEN) 5 MG TABLET    Take 1 tablet (5 mg total) by mouth at bedtime as needed for sleep.   2.  The patient was instructed  in symptomatic care and handouts were given. 3.  The patient was told to return if becoming worse in any way, if no better in 3 or 4 days, and given some red flag symptoms that would indicate earlier return.    Reuben Likes, MD 07/15/12 2131

## 2012-07-15 NOTE — ED Notes (Signed)
Pt is here today c/o insomnia since 07/08/12.  Reports that her brother passed away on that day and since then has not been able to sleep.  Also c/o not able to keep food and fluids down x8 days. Sx include: nauseas, vomiting  Denies: diarrhea, constipation, abd pain, fevers  She is alert w/no signs of acute distress.

## 2012-07-18 ENCOUNTER — Encounter: Payer: Self-pay | Admitting: Internal Medicine

## 2012-07-19 ENCOUNTER — Emergency Department (INDEPENDENT_AMBULATORY_CARE_PROVIDER_SITE_OTHER): Payer: BC Managed Care – PPO

## 2012-07-19 ENCOUNTER — Emergency Department (HOSPITAL_COMMUNITY)
Admission: EM | Admit: 2012-07-19 | Discharge: 2012-07-19 | Disposition: A | Discharge status: Home / Self Care | Payer: BC Managed Care – PPO | Source: Home / Self Care | Attending: Emergency Medicine | Admitting: Emergency Medicine

## 2012-07-19 ENCOUNTER — Encounter (HOSPITAL_COMMUNITY): Payer: Self-pay | Admitting: *Deleted

## 2012-07-19 DIAGNOSIS — K5900 Constipation, unspecified: Secondary | ICD-10-CM

## 2012-07-19 DIAGNOSIS — R112 Nausea with vomiting, unspecified: Secondary | ICD-10-CM

## 2012-07-19 LAB — POCT I-STAT, CHEM 8
BUN: 4 mg/dL — ABNORMAL LOW (ref 6–23)
Calcium, Ion: 1.24 mmol/L — ABNORMAL HIGH (ref 1.12–1.23)
Chloride: 107 mEq/L (ref 96–112)
Creatinine, Ser: 0.8 mg/dL (ref 0.50–1.10)
Glucose, Bld: 148 mg/dL — ABNORMAL HIGH (ref 70–99)
HCT: 43 % (ref 36.0–46.0)
Hemoglobin: 14.6 g/dL (ref 12.0–15.0)
Potassium: 4 mEq/L (ref 3.5–5.1)
Sodium: 137 mEq/L (ref 135–145)
TCO2: 22 mmol/L (ref 0–100)

## 2012-07-19 MED ORDER — POLYETHYLENE GLYCOL 3350 17 GM/SCOOP PO POWD: 17.0000 g | Freq: Every day | ORAL | Status: DC

## 2012-07-19 MED ORDER — METOCLOPRAMIDE HCL 10 MG PO TABS: ORAL_TABLET | ORAL | Status: DC

## 2012-07-19 NOTE — ED Provider Notes (Signed)
Chief Complaint  Patient presents with  . Follow-up    History of Present Illness:    The patient is a 47 year old female who was seen here 4 days ago for the same symptoms. She relates a history since December 31 of nausea and vomiting. She initially had been vomiting up all by mouth intake. This came on after she learned that her brother had died suddenly and she was very upset. She's also had difficulty sleeping at night. She had not had any abdominal pain. At that time her blood work was normal and she was given Carafate in addition to her Nexium that she was already on plus some Zofran. Since she was here last the patient is able to keep a little bit down on her stomach. She's been able to eat crackers, Boost, and sips of clear liquids, but has not been able to keep any regular food on her stomach. There's been no hematemesis, coffee-ground emesis, or bilious emesis. She denies any abdominal pain. She did make the appointment with Dr. Christella Hartigan, but he would not be able to see her until the 23rd of this month, 13 days from now. She's also been in to see her headache Dr. at the [facility] and got some trigger point injections for migraines. She's been somewhat constipated and hasn't had a bowel movement since Monday or Tuesday. She is sleeping a little bit better at night, getting 3-4 hours per night with Ambien.  Review of Systems:  Other than noted above, the patient denies any of the following symptoms: Constitutional:  No fever, chills, fatigue, weight loss or anorexia. Lungs:  No cough or shortness of breath. Heart:  No chest pain, palpitations, syncope or edema.  No cardiac history. Abdomen:  No nausea, vomiting, hematememesis, melena, diarrhea, or hematochezia. GU:  No dysuria, frequency, urgency, or hematuria. Gyn:  No vaginal discharge, itching, abnormal bleeding, dyspareunia, or pelvic pain.  PMFSH:  Past medical history, family history, social history, meds, and allergies  were reviewed along with nurse's notes.  No prior abdominal surgeries, past history of GI problems, STDs or GYN problems.  No history of aspirin or NSAID use.  No excessive alcohol intake.  Physical Exam:   Vital signs:  BP 162/93  Pulse 74  Temp 98 F (36.7 C) (Oral)  Resp 20  SpO2 97%  LMP 06/26/2012 Gen:  Alert, oriented, in no distress. Lungs:  Breath sounds clear and equal bilaterally.  No wheezes, rales or rhonchi. Heart:  Regular rhythm.  No gallops or murmers.   Abdomen:  Soft, flat, and nondistended. No tenderness, guarding, or rebound. No organomegaly or mass. Bowel sounds are normally active. Skin:  Clear, warm and dry.  No rash.  Labs:   Results for orders placed during the hospital encounter of 07/19/12  POCT I-STAT, CHEM 8      Component Value Range   Sodium 137  135 - 145 mEq/L   Potassium 4.0  3.5 - 5.1 mEq/L   Chloride 107  96 - 112 mEq/L   BUN 4 (*) 6 - 23 mg/dL   Creatinine, Ser 1.19  0.50 - 1.10 mg/dL   Glucose, Bld 147 (*) 70 - 99 mg/dL   Calcium, Ion 8.29 (*) 1.12 - 1.23 mmol/L   TCO2 22  0 - 100 mmol/L   Hemoglobin 14.6  12.0 - 15.0 g/dL   HCT 56.2  13.0 - 86.5 %     Radiology:  Dg Abd Acute W/chest  07/19/2012  *RADIOLOGY REPORT*  Clinical Data: Nausea and vomiting for 2 weeks.  ACUTE ABDOMEN SERIES (ABDOMEN 2 VIEW & CHEST 1 VIEW)  Comparison: No priors.  Findings: Lung volumes are normal.  No consolidative airspace disease.  No pleural effusions.  No pneumothorax.  No pulmonary nodule or mass noted.  Pulmonary vasculature and the cardiomediastinal silhouette are within normal limits.  Gas and stool are seen scattered throughout the colon.  No pathologic distension of small bowel is noted.  No gross evidence of pneumoperitoneum.  Surgical clips in the right upper quadrant, likely from prior cholecystectomy.  A single surgical clip is noted in the lower anatomic pelvis.  Moderate stool burden.  Numerous phleboliths in the pelvis.  IMPRESSION: 1.  Nonobstructive  bowel gas pattern. 2.  No pneumoperitoneum. 3.  No radiographic evidence of acute cardiopulmonary disease. 4.  Moderate stool burden may suggest constipation.   Original Report Authenticated By: Trudie Reed, M.D.     Assessment:  The primary encounter diagnosis was Nausea and vomiting. A diagnosis of Constipation was also pertinent to this visit.  She could have an ulcer, gastritis, gastroparesis, or cyclical vomiting syndrome. The constipation might be playing a role as well. I told her that we would try the metoclopramide which looks like she has taken in the past as well as MiraLax for the constipation. If the vomiting persists only thing left to do would be for her to go to the hospital for inpatient evaluation. Right now her ear she's keeping some nutrients on her stomach, including Boost and liquids, she doesn't show any sign of being dehydrated, so I think she would be okay to wait until she can see Dr. Christella Hartigan.  Plan:   1.  The following meds were prescribed:   New Prescriptions   METOCLOPRAMIDE (REGLAN) 10 MG TABLET    Take 1 tablet 4 times daily, before each meal and at bedtime.   POLYETHYLENE GLYCOL POWDER (MIRALAX) POWDER    Take 17 g by mouth daily.   2.  The patient was instructed in symptomatic care and handouts were given. 3.  The patient was told to return if becoming worse in any way, if no better in 3 or 4 days, and given some red flag symptoms that would indicate earlier return.    Reuben Likes, MD 07/19/12 1700

## 2012-07-19 NOTE — ED Notes (Signed)
Pt here for follow up. Continues to have nausea and unable to eat a full meal, averaging 4 hours of sleep a night with Ambien. Pt reports that she believes gi problems are nerve related.

## 2012-07-22 ENCOUNTER — Emergency Department (HOSPITAL_COMMUNITY)
Admission: EM | Admit: 2012-07-22 | Discharge: 2012-07-22 | Disposition: A | Discharge status: Home / Self Care | Payer: BC Managed Care – PPO | Attending: Emergency Medicine | Admitting: Emergency Medicine

## 2012-07-22 ENCOUNTER — Encounter (HOSPITAL_COMMUNITY): Payer: Self-pay | Admitting: *Deleted

## 2012-07-22 DIAGNOSIS — F411 Generalized anxiety disorder: Secondary | ICD-10-CM | POA: Insufficient documentation

## 2012-07-22 DIAGNOSIS — E669 Obesity, unspecified: Secondary | ICD-10-CM | POA: Insufficient documentation

## 2012-07-22 DIAGNOSIS — I10 Essential (primary) hypertension: Secondary | ICD-10-CM | POA: Insufficient documentation

## 2012-07-22 DIAGNOSIS — J45909 Unspecified asthma, uncomplicated: Secondary | ICD-10-CM | POA: Insufficient documentation

## 2012-07-22 DIAGNOSIS — Z79899 Other long term (current) drug therapy: Secondary | ICD-10-CM | POA: Insufficient documentation

## 2012-07-22 DIAGNOSIS — K219 Gastro-esophageal reflux disease without esophagitis: Secondary | ICD-10-CM | POA: Insufficient documentation

## 2012-07-22 DIAGNOSIS — R112 Nausea with vomiting, unspecified: Secondary | ICD-10-CM

## 2012-07-22 DIAGNOSIS — F419 Anxiety disorder, unspecified: Secondary | ICD-10-CM

## 2012-07-22 DIAGNOSIS — Z8679 Personal history of other diseases of the circulatory system: Secondary | ICD-10-CM | POA: Insufficient documentation

## 2012-07-22 MED ORDER — ONDANSETRON HCL 4 MG PO TABS: 8.0000 mg | ORAL_TABLET | Freq: Three times a day (TID) | ORAL | Status: DC | PRN

## 2012-07-22 NOTE — ED Notes (Signed)
Pt unable to eat or drink without vomitting, and she is taking ambien for sleep. Pt believes it's her nerves after her brother passed that is causing her symptoms. A&Ox4, ambulatory, nad.

## 2012-07-22 NOTE — ED Provider Notes (Signed)
History   This chart was scribed for Lindsey Sou, MD by [name], ED Scribe. This patient was seen in room TR09C/TR09C and the patient's care was started at 6:01 PM    CSN: [id number]  Arrival date & time 07/22/12  1519   First MD Initiated Contact with Patient 07/22/12 1759      Chief Complaint  Patient presents with  . Emesis  . Anxiety    The history is provided by the patient. No language interpreter was used.   [name] is a 47 y.o. female with h/o asthma, HTN, GERD who presents to the Emergency Department complaining of difficulty eating and drinking since her brother passed away 07/15/23 with associated nausea and non-bloody, non-bilious emesis whenever she tries to eat or drink.  Pt denies abdominal pain and is not currently nauseated.  Pt seen here 01/06 and 01/10 for same symptoms and had negative acute abdominal w/ chest XR on 01/10.  She presents again because she wants to "get back to normal" and reports symptoms are not worsening.  Pt has used anti-emetics with no relief.  Pt reports she thinks she needs counseling.  Pt has appointment with Dr. Christella Hartigan 01/23.  Pt denies tobacco and alcohol use.  No PCP.  Dr at [facility] monitors migraines.   Past Medical History  Diagnosis Date  . Asthma   . Migraine   . Hypertension   . Acid reflux   . Obesity   . GERD (gastroesophageal reflux disease)     Past Surgical History  Procedure Date  . Tubal ligation   . Cholecystectomy   . Hernia repair   . Rotator cuff repair     No family history on file.  History  Substance Use Topics  . Smoking status: Never Smoker   . Smokeless tobacco: Never Used  . Alcohol Use: No    No OB history provided.   Review of Systems  Constitutional: Positive for appetite change.  HENT: Negative.   Respiratory: Negative.   Cardiovascular: Negative.   Gastrointestinal: Positive for nausea and vomiting. Negative for abdominal pain.  Musculoskeletal: Negative.   Skin:  Negative.   Neurological: Negative.   Hematological: Negative.   Psychiatric/Behavioral: Negative.     Allergies  Vicodin  Home Medications   Current Outpatient Rx  Name  Route  Sig  Dispense  Refill  . ESOMEPRAZOLE MAGNESIUM 40 MG PO CPDR   Oral   Take 40 mg by mouth daily.         Marland Kitchen METOCLOPRAMIDE HCL 10 MG PO TABS      Take 1 tablet 4 times daily, before each meal and at bedtime.   60 tablet   0   . ONDANSETRON 8 MG PO TBDP   Oral   Take 1 tablet (8 mg total) by mouth every 8 (eight) hours as needed for nausea.   20 tablet   0   . POLYETHYLENE GLYCOL 3350 PO POWD   Oral   Take 17 g by mouth daily.   255 g   0   . SUCRALFATE 1 G PO TABS   Oral   Take 1 tablet (1 g total) by mouth 4 (four) times daily.   60 tablet   0   . TOPIRAMATE 25 MG PO TABS   Oral   Take 25 mg by mouth at bedtime.         Marland Kitchen ZOLPIDEM TARTRATE 5 MG PO TABS   Oral   Take 1  tablet (5 mg total) by mouth at bedtime as needed for sleep.   15 tablet   0   . METOCLOPRAMIDE HCL 10 MG PO TABS   Oral   Take 1 tablet (10 mg total) by mouth every 6 (six) hours as needed (for nausea and/or vomiting).   10 tablet   0     BP 148/100  Pulse 85  Temp 98.1 F (36.7 C) (Oral)  Resp 16  SpO2 99%  LMP 06/26/2012   Physical Exam  Nursing note and vitals reviewed. Constitutional: She appears well-developed and well-nourished.  HENT:  Head: Normocephalic and atraumatic.  Eyes: Conjunctivae normal are normal. Pupils are equal, round, and reactive to light.  Neck: Neck supple. No tracheal deviation present. No thyromegaly present.  Cardiovascular: Normal rate and regular rhythm.   No murmur heard. Pulmonary/Chest: Effort normal and breath sounds normal.  Abdominal: Soft. Bowel sounds are normal. She exhibits no distension. There is no tenderness.       obese  Musculoskeletal: Normal range of motion. She exhibits no edema and no tenderness.  Neurological: She is alert. Coordination  normal.  Skin: Skin is warm and dry. No rash noted.  Psychiatric: She has a normal mood and affect.    ED Course  Procedures (including critical care time) DIAGNOSTIC STUDIES: Oxygen Saturation is 99% on room air, normal by my interpretation.    COORDINATION OF CARE: 6:15 PM- Patient informed of clinical course, understands medical decision-making process, and agrees with plan.  Labs Reviewed - No data to display No results found.   No diagnosis found.  Patient a graham crackers without difficulty and emergency department. No nausea or vomiting afterwards MDM  I feel that emesis is likely secondary degrees reaction as it is temporarily related patient's brother's death. Plan prescription Zofran She has been given numbers to call for grief counseling She is encouraged to keep her appointment with gastroenterology this month Blood pressure recheck 3 weeks  Diagnosis #1 depression and anxiety 2 nausea&vomiting 3 elevated blood pressure        Lindsey Sou, MD 07/22/12 1908

## 2012-07-22 NOTE — ED Notes (Signed)
Pt's began vomiting and being unable to sleep 2 weeks ago when her brother died.  She has since been to urgent care twice.  Ambien has helped her sleep 4 hours a night, but the zofran is not helping the emesis.  Pt states nausea only when she attempts to eat.  PT appears very sad and anxious.

## 2012-07-29 ENCOUNTER — Encounter: Payer: Self-pay | Admitting: Internal Medicine

## 2012-08-01 ENCOUNTER — Ambulatory Visit: Payer: BC Managed Care – PPO | Admitting: Internal Medicine

## 2012-09-24 ENCOUNTER — Emergency Department (INDEPENDENT_AMBULATORY_CARE_PROVIDER_SITE_OTHER)
Admission: EM | Admit: 2012-09-24 | Discharge: 2012-09-24 | Disposition: A | Discharge status: Home / Self Care | Payer: BC Managed Care – PPO | Source: Home / Self Care | Attending: Family Medicine | Admitting: Family Medicine

## 2012-09-24 ENCOUNTER — Encounter (HOSPITAL_COMMUNITY): Payer: Self-pay | Admitting: *Deleted

## 2012-09-24 DIAGNOSIS — S20221A Contusion of right back wall of thorax, initial encounter: Secondary | ICD-10-CM

## 2012-09-24 DIAGNOSIS — S20229A Contusion of unspecified back wall of thorax, initial encounter: Secondary | ICD-10-CM

## 2012-09-24 MED ORDER — KETOROLAC TROMETHAMINE 10 MG PO TABS: 10.0000 mg | ORAL_TABLET | Freq: Four times a day (QID) | ORAL | Status: DC | PRN

## 2012-09-24 NOTE — ED Notes (Signed)
Reports slipping on ice and falling onto right side this morning @ 0645.  C/O right posterior rib, side, and hip pain.  Denies any painful breathing.  Also c/o slight right wrist soreness "but it's fine".  Has been alternating btwn heat & ice.

## 2012-09-24 NOTE — ED Provider Notes (Addendum)
History     CSN: [id number]  Arrival date & time 09/24/12  1815   None     Chief Complaint  Patient presents with  . Fall    (Consider location/radiation/quality/duration/timing/severity/associated sxs/prior treatment) Patient is a 47 y.o. female presenting with fall. The history is provided by the patient.  Fall The accident occurred 6 to 12 hours ago. The fall occurred while walking. She fell from a height of 1 to 2 ft. She landed on concrete (slipped on black ice in parking lot of apt bldg.). Point of impact: right flank area sore. The pain is mild. She was ambulatory at the scene.    Past Medical History  Diagnosis Date  . Asthma   . Migraine   . Hypertension   . Acid reflux   . Obesity   . GERD (gastroesophageal reflux disease)     Past Surgical History  Procedure Laterality Date  . Tubal ligation    . Cholecystectomy    . Rotator cuff repair    . Hernia repair      x2    No family history on file.  History  Substance Use Topics  . Smoking status: Never Smoker   . Smokeless tobacco: Never Used  . Alcohol Use: No    OB History   Grav Para Term Preterm Abortions TAB SAB Ect Mult Living                  Review of Systems  Constitutional: Negative.   Gastrointestinal: Negative.   Genitourinary: Negative.   Musculoskeletal: Positive for back pain. Negative for gait problem.  Skin: Negative.     Allergies  Vicodin  Home Medications   Current Outpatient Rx  Name  Route  Sig  Dispense  Refill  . esomeprazole (NEXIUM) 40 MG capsule   Oral   Take 40 mg by mouth daily.         . polyethylene glycol powder (MIRALAX) powder   Oral   Take 17 g by mouth daily.   255 g   0   . topiramate (TOPAMAX) 25 MG tablet   Oral   Take 25 mg by mouth at bedtime.         Marland Kitchen ketorolac (TORADOL) 10 MG tablet   Oral   Take 1 tablet (10 mg total) by mouth every 6 (six) hours as needed for pain.   20 tablet   0   . EXPIRED: metoCLOPramide (REGLAN) 10 MG  tablet   Oral   Take 1 tablet (10 mg total) by mouth every 6 (six) hours as needed (for nausea and/or vomiting).   10 tablet   0   . metoCLOPramide (REGLAN) 10 MG tablet      Take 1 tablet 4 times daily, before each meal and at bedtime.   60 tablet   0   . ondansetron (ZOFRAN ODT) 8 MG disintegrating tablet   Oral   Take 1 tablet (8 mg total) by mouth every 8 (eight) hours as needed for nausea.   20 tablet   0   . ondansetron (ZOFRAN) 4 MG tablet   Oral   Take 2 tablets (8 mg total) by mouth every 8 (eight) hours as needed for nausea.   12 tablet   0   . sucralfate (CARAFATE) 1 G tablet   Oral   Take 1 tablet (1 g total) by mouth 4 (four) times daily.   60 tablet   0   . zolpidem (AMBIEN) 5  MG tablet   Oral   Take 1 tablet (5 mg total) by mouth at bedtime as needed for sleep.   15 tablet   0     BP 149/93  Pulse 79  Temp(Src) 97.9 F (36.6 C) (Oral)  Resp 16  SpO2 100%  LMP 09/16/2012  Physical Exam  Nursing note and vitals reviewed. Constitutional: She is oriented to person, place, and time. She appears well-developed and well-nourished.  HENT:  Head: Normocephalic and atraumatic.  Neck: Normal range of motion. Neck supple.  Pulmonary/Chest: Effort normal and breath sounds normal.  Abdominal: Soft. Bowel sounds are normal.  Musculoskeletal: She exhibits tenderness.       Arms: Neurological: She is alert and oriented to person, place, and time.  Skin: Skin is warm and dry.    ED Course  Procedures (including critical care time)  Labs Reviewed - No data to display No results found.   1. Contusion, back, right, initial encounter       MDM          Linna Hoff, MD 09/24/12 1610  Linna Hoff, MD 09/25/12 2001

## 2012-10-03 ENCOUNTER — Encounter (HOSPITAL_COMMUNITY): Payer: Self-pay | Admitting: Emergency Medicine

## 2012-10-03 ENCOUNTER — Emergency Department (HOSPITAL_COMMUNITY)
Admission: EM | Admit: 2012-10-03 | Discharge: 2012-10-03 | Disposition: A | Discharge status: Home / Self Care | Payer: BC Managed Care – PPO | Source: Home / Self Care

## 2012-10-03 ENCOUNTER — Emergency Department (HOSPITAL_COMMUNITY): Payer: BC Managed Care – PPO

## 2012-10-03 DIAGNOSIS — S20229A Contusion of unspecified back wall of thorax, initial encounter: Secondary | ICD-10-CM

## 2012-10-03 DIAGNOSIS — J069 Acute upper respiratory infection, unspecified: Secondary | ICD-10-CM

## 2012-10-03 DIAGNOSIS — J329 Chronic sinusitis, unspecified: Secondary | ICD-10-CM

## 2012-10-03 DIAGNOSIS — J309 Allergic rhinitis, unspecified: Secondary | ICD-10-CM

## 2012-10-03 LAB — POCT RAPID STREP A: Streptococcus, Group A Screen (Direct): NEGATIVE

## 2012-10-03 MED ORDER — TRIAMCINOLONE ACETONIDE 40 MG/ML IJ SUSP
40.0000 mg | Freq: Once | INTRAMUSCULAR | Status: AC
  Administered 2012-10-03: 40 mg via INTRAMUSCULAR

## 2012-10-03 MED ORDER — FLUTICASONE PROPIONATE 50 MCG/ACT NA SUSP: 2.0000 | Freq: Every day | NASAL | Status: DC

## 2012-10-03 MED ORDER — TRIAMCINOLONE ACETONIDE 40 MG/ML IJ SUSP
INTRAMUSCULAR | Status: AC
  Filled 2012-10-03: qty 5

## 2012-10-03 NOTE — ED Provider Notes (Signed)
History     CSN: [id number]  Arrival date & time 10/03/12  1829   None     Chief Complaint  Patient presents with  . Recurrent Sinusitis    x 4 days    (Consider location/radiation/quality/duration/timing/severity/associated sxs/prior treatment) HPI Comments: 47 year old female presents with a chief complaint of "I have a sinus infection". She is complaining of paranasal and frontal discomfort for 5 days. Is also complaining of PND associated with change in voice. She is taking Tylenol, Allegra and saline nasal spray and she is not any better. She states she has had a low-grade fever. No earache or sore throat. No problems with respirations or shortness of breath. She has a history of allergies which has caused similar symptoms in the past.   Past Medical History  Diagnosis Date  . Asthma   . Migraine   . Hypertension   . Acid reflux   . Obesity   . GERD (gastroesophageal reflux disease)     Past Surgical History  Procedure Laterality Date  . Tubal ligation    . Cholecystectomy    . Rotator cuff repair    . Hernia repair      x2    History reviewed. No pertinent family history.  History  Substance Use Topics  . Smoking status: Never Smoker   . Smokeless tobacco: Never Used  . Alcohol Use: No    OB History   Grav Para Term Preterm Abortions TAB SAB Ect Mult Living                  Review of Systems  Constitutional: Negative for fever, chills, activity change, appetite change and fatigue.  HENT: Positive for congestion, rhinorrhea, voice change and postnasal drip. Negative for facial swelling, neck pain and neck stiffness.   Eyes: Negative.   Respiratory: Negative.   Cardiovascular: Negative.   Gastrointestinal: Negative.   Genitourinary: Negative.   Musculoskeletal:       As per HPI  Skin: Negative for color change, pallor and rash.  Neurological: Negative.     Allergies  Vicodin  Home Medications   Current Outpatient Rx  Name  Route  Sig   Dispense  Refill  . esomeprazole (NEXIUM) 40 MG capsule   Oral   Take 40 mg by mouth daily.         Marland Kitchen topiramate (TOPAMAX) 25 MG tablet   Oral   Take 25 mg by mouth at bedtime.         . fluticasone (FLONASE) 50 MCG/ACT nasal spray   Nasal   Place 2 sprays into the nose daily.   16 g   2   . ketorolac (TORADOL) 10 MG tablet   Oral   Take 1 tablet (10 mg total) by mouth every 6 (six) hours as needed for pain.   20 tablet   0   . EXPIRED: metoCLOPramide (REGLAN) 10 MG tablet   Oral   Take 1 tablet (10 mg total) by mouth every 6 (six) hours as needed (for nausea and/or vomiting).   10 tablet   0   . metoCLOPramide (REGLAN) 10 MG tablet      Take 1 tablet 4 times daily, before each meal and at bedtime.   60 tablet   0   . ondansetron (ZOFRAN ODT) 8 MG disintegrating tablet   Oral   Take 1 tablet (8 mg total) by mouth every 8 (eight) hours as needed for nausea.   20 tablet   0   .  ondansetron (ZOFRAN) 4 MG tablet   Oral   Take 2 tablets (8 mg total) by mouth every 8 (eight) hours as needed for nausea.   12 tablet   0   . polyethylene glycol powder (MIRALAX) powder   Oral   Take 17 g by mouth daily.   255 g   0   . sucralfate (CARAFATE) 1 G tablet   Oral   Take 1 tablet (1 g total) by mouth 4 (four) times daily.   60 tablet   0   . zolpidem (AMBIEN) 5 MG tablet   Oral   Take 1 tablet (5 mg total) by mouth at bedtime as needed for sleep.   15 tablet   0     BP 128/90  Pulse 81  Temp(Src) 98.7 F (37.1 C) (Oral)  Resp 12  SpO2 95%  LMP 09/19/2012  Physical Exam  Nursing note and vitals reviewed. Constitutional: She is oriented to person, place, and time. She appears well-developed and well-nourished. No distress.  HENT:  Right Ear: External ear normal.  Left Ear: External ear normal.  Bilateral TMs are normal Oropharynx minor erythema and scant clear PND. No exudates or swelling.  Eyes: Conjunctivae are normal.  Neck: Normal range of  motion. Neck supple.  Cardiovascular: Normal rate, regular rhythm and normal heart sounds.   Pulmonary/Chest: Effort normal and breath sounds normal. No respiratory distress. She has no wheezes. She has no rales.  Musculoskeletal: Normal range of motion. She exhibits no edema.  Lymphadenopathy:    She has no cervical adenopathy.  Neurological: She is alert and oriented to person, place, and time.  Skin: Skin is warm and dry. No rash noted.  Psychiatric: She has a normal mood and affect.    ED Course  Procedures (including critical care time)  Labs Reviewed  POCT RAPID STREP A ([facility] ONLY)   No results found.  Results for orders placed during the hospital encounter of 10/03/12  POCT RAPID STREP A ([facility] ONLY)      Result Value Range   Streptococcus, Group A Screen (Direct) NEGATIVE  NEGATIVE      1. URI (upper respiratory infection)   2. Allergic rhinitis   3. Sinusitis nasal       MDM  Sudafed PE 10 mg every 4 hours when necessary congestion Mucinex as directed Drink plenty of fluids stay well hydrated Fluticasone nasal spray as directed Continue using copious amounts of saline nasal spray Kenalog 40 mg IM Followup with your primary care doctor. If he did not have one strongly recommended he obtain a primary care doctor as soon as possible.          Hayden Rasmussen, NP 10/03/12 2048

## 2012-10-03 NOTE — ED Notes (Signed)
Reports: sinus pressure and pain. Ear/hearing is muffled.  Productive cough with green sputum that is blood tinged. Low grade temp x 2 days.   Pt has tried saline nasal drops allegra and vitamin c with no relief in symptoms.

## 2012-10-04 NOTE — ED Provider Notes (Signed)
Medical screening examination/treatment/procedure(s) were performed by resident physician or non-physician practitioner and as supervising physician I was immediately available for consultation/collaboration.   Irmalee Riemenschneider DOUGLAS MD.   Saunders [place] D Rasa Degrazia, MD 10/04/12 1725 

## 2012-10-14 ENCOUNTER — Encounter (HOSPITAL_COMMUNITY): Payer: Self-pay | Admitting: Emergency Medicine

## 2012-10-14 ENCOUNTER — Emergency Department (INDEPENDENT_AMBULATORY_CARE_PROVIDER_SITE_OTHER)
Admission: EM | Admit: 2012-10-14 | Discharge: 2012-10-14 | Disposition: A | Discharge status: Home / Self Care | Payer: BC Managed Care – PPO | Source: Home / Self Care | Attending: Emergency Medicine | Admitting: Emergency Medicine

## 2012-10-14 DIAGNOSIS — J040 Acute laryngitis: Secondary | ICD-10-CM

## 2012-10-14 MED ORDER — DOXYCYCLINE HYCLATE 100 MG PO CAPS: 100.0000 mg | ORAL_CAPSULE | Freq: Two times a day (BID) | ORAL | Status: AC

## 2012-10-14 NOTE — ED Provider Notes (Signed)
History     CSN: [id number]  Arrival date & time 10/14/12  1340   First MD Initiated Contact with Patient 10/14/12 1405      Chief Complaint  Patient presents with  . Laryngitis    (Consider location/radiation/quality/duration/timing/severity/associated sxs/prior treatment) HPI Comments: Patient presents to, urgent care describing ongoing sinus congestion, drainage cough and a productive cough with yellow phlegm. Is also describing that her voice has gotten hoarse... she is taking Allegra as well as a nasal steroid, for several days and is not helping. Denies any shortness or breath or fevers. Does have some irritation and itchiness in her throat.  The history is provided by the patient.    Past Medical History  Diagnosis Date  . Asthma   . Migraine   . Hypertension   . Acid reflux   . Obesity   . GERD (gastroesophageal reflux disease)     Past Surgical History  Procedure Laterality Date  . Tubal ligation    . Cholecystectomy    . Rotator cuff repair    . Hernia repair      x2    History reviewed. No pertinent family history.  History  Substance Use Topics  . Smoking status: Never Smoker   . Smokeless tobacco: Never Used  . Alcohol Use: No    OB History   Grav Para Term Preterm Abortions TAB SAB Ect Mult Living                  Review of Systems  Constitutional: Negative for fever, diaphoresis, activity change, appetite change and fatigue.  HENT: Positive for congestion, sore throat, sneezing, voice change and postnasal drip. Negative for neck pain, neck stiffness and ear discharge.   Eyes: Negative for photophobia and redness.  Respiratory: Negative for cough, shortness of breath and wheezing.   Cardiovascular: Negative for chest pain, palpitations and leg swelling.  Gastrointestinal: Negative for nausea, vomiting, diarrhea and rectal pain.  Endocrine: Negative for polyuria.  Genitourinary: Negative for dysuria.  Skin: Negative for pallor and rash.     Allergies  Vicodin  Home Medications   Current Outpatient Rx  Name  Route  Sig  Dispense  Refill  . doxycycline (VIBRAMYCIN) 100 MG capsule   Oral   Take 1 capsule (100 mg total) by mouth 2 (two) times daily.   20 capsule   0   . esomeprazole (NEXIUM) 40 MG capsule   Oral   Take 40 mg by mouth daily.         . fluticasone (FLONASE) 50 MCG/ACT nasal spray   Nasal   Place 2 sprays into the nose daily.   16 g   2   . ketorolac (TORADOL) 10 MG tablet   Oral   Take 1 tablet (10 mg total) by mouth every 6 (six) hours as needed for pain.   20 tablet   0   . EXPIRED: metoCLOPramide (REGLAN) 10 MG tablet   Oral   Take 1 tablet (10 mg total) by mouth every 6 (six) hours as needed (for nausea and/or vomiting).   10 tablet   0   . metoCLOPramide (REGLAN) 10 MG tablet      Take 1 tablet 4 times daily, before each meal and at bedtime.   60 tablet   0   . ondansetron (ZOFRAN ODT) 8 MG disintegrating tablet   Oral   Take 1 tablet (8 mg total) by mouth every 8 (eight) hours as needed for nausea.  20 tablet   0   . ondansetron (ZOFRAN) 4 MG tablet   Oral   Take 2 tablets (8 mg total) by mouth every 8 (eight) hours as needed for nausea.   12 tablet   0   . polyethylene glycol powder (MIRALAX) powder   Oral   Take 17 g by mouth daily.   255 g   0   . sucralfate (CARAFATE) 1 G tablet   Oral   Take 1 tablet (1 g total) by mouth 4 (four) times daily.   60 tablet   0   . topiramate (TOPAMAX) 25 MG tablet   Oral   Take 25 mg by mouth at bedtime.         Marland Kitchen zolpidem (AMBIEN) 5 MG tablet   Oral   Take 1 tablet (5 mg total) by mouth at bedtime as needed for sleep.   15 tablet   0     BP 132/91  Pulse 72  Temp(Src) 98.2 F (36.8 C) (Oral)  Resp 22  SpO2 98%  LMP 09/19/2012  Physical Exam  Nursing note and vitals reviewed. Constitutional: Vital signs are normal. She appears well-developed and well-nourished.  Non-toxic appearance. She does not have a  sickly appearance. She does not appear ill.  HENT:  Head: Normocephalic.  Right Ear: Tympanic membrane normal.  Left Ear: Tympanic membrane normal.  Mouth/Throat: Uvula is midline. No oropharyngeal exudate, posterior oropharyngeal edema, posterior oropharyngeal erythema or tonsillar abscesses.  Eyes: Conjunctivae are normal.  Neck: Neck supple. No JVD present.  Pulmonary/Chest: Effort normal and breath sounds normal.  Abdominal: Soft.  Lymphadenopathy:    She has no cervical adenopathy.  Neurological: She is alert.  Skin: No rash noted. No erythema.    ED Course  Procedures (including critical care time)  Labs Reviewed - No data to display No results found.   1. Laryngitis       MDM  Persistent symptomatology despite the use of nasal steroid spray, antihistamine. Patient has been prescribed a cycle of doxycycline for laryngitis and sinusitis related symptoms that are worsening and persistent for greater than 8 days.        Jimmie Molly, MD 10/14/12 1540

## 2012-10-14 NOTE — ED Notes (Signed)
Pt c/o of laryngitis since 03/27 was seen and treated here with no relief of symptoms. Has used medications as prescribed and is currently still taking. Still has nasal drainage with sneezing, productive cough with yellowish green phlegm. Patient is alert and oriented.

## 2012-10-23 ENCOUNTER — Encounter (HOSPITAL_COMMUNITY): Payer: Self-pay | Admitting: *Deleted

## 2012-10-23 ENCOUNTER — Emergency Department (HOSPITAL_COMMUNITY)
Admission: EM | Admit: 2012-10-23 | Discharge: 2012-10-23 | Disposition: A | Discharge status: Home / Self Care | Payer: BC Managed Care – PPO | Attending: Emergency Medicine | Admitting: Emergency Medicine

## 2012-10-23 ENCOUNTER — Emergency Department (HOSPITAL_COMMUNITY): Payer: BC Managed Care – PPO

## 2012-10-23 ENCOUNTER — Telehealth (HOSPITAL_COMMUNITY): Payer: Self-pay | Admitting: Emergency Medicine

## 2012-10-23 DIAGNOSIS — E669 Obesity, unspecified: Secondary | ICD-10-CM | POA: Insufficient documentation

## 2012-10-23 DIAGNOSIS — R490 Dysphonia: Secondary | ICD-10-CM | POA: Insufficient documentation

## 2012-10-23 DIAGNOSIS — Z79899 Other long term (current) drug therapy: Secondary | ICD-10-CM | POA: Insufficient documentation

## 2012-10-23 DIAGNOSIS — I10 Essential (primary) hypertension: Secondary | ICD-10-CM | POA: Insufficient documentation

## 2012-10-23 DIAGNOSIS — J45909 Unspecified asthma, uncomplicated: Secondary | ICD-10-CM | POA: Insufficient documentation

## 2012-10-23 DIAGNOSIS — K219 Gastro-esophageal reflux disease without esophagitis: Secondary | ICD-10-CM | POA: Insufficient documentation

## 2012-10-23 DIAGNOSIS — R093 Abnormal sputum: Secondary | ICD-10-CM | POA: Insufficient documentation

## 2012-10-23 DIAGNOSIS — Z8679 Personal history of other diseases of the circulatory system: Secondary | ICD-10-CM | POA: Insufficient documentation

## 2012-10-23 MED ORDER — PREDNISONE 20 MG PO TABS: 40.0000 mg | ORAL_TABLET | Freq: Every day | ORAL | Status: DC

## 2012-10-23 NOTE — ED Provider Notes (Signed)
Medical screening examination/treatment/procedure(s) were performed by non-physician practitioner and as supervising physician I was immediately available for consultation/collaboration.  Tobin Chad, MD 10/23/12 [phone]

## 2012-10-23 NOTE — ED Notes (Signed)
Pt called wanting to get work note extended until she saw ENT Monday.  Ivar Drape PA who saw pt consulted and he couldn't write her out for that duration because physically nothing wrong with her.  Pt informed and wasn't happy.  Call forwarded to Service excellence.

## 2012-10-23 NOTE — ED Notes (Signed)
Pt reports she has been to [facility] 2 times since 10-03-12 . Today ia the last day of a ten day course of antibx. . Pt states she still has a prductive cough and is horse.

## 2012-10-23 NOTE — ED Provider Notes (Signed)
History     CSN: [id number]  Arrival date & time 10/23/12  4098   First MD Initiated Contact with Patient 10/23/12 [phone]      Chief Complaint  Patient presents with  . Cough  . Hoarse    (Consider location/radiation/quality/duration/timing/severity/associated sxs/prior treatment) HPI Comments: Patient presents to the emergency department with chief complaint of hoarseness x2 weeks. She has been seen previously by the urgent care, who gave her doxycycline. She states that this has not helped. She also has a history of acid reflux, but has been taking Nexium. She believes that her symptoms are due to allergies.  She endorses a productive cough with yellow sputum. She denies fever, chills, nausea, or vomiting. She denies being in any pain. She states that she is becoming very frustrated, as she employed as a call center.  The history is provided by the patient. No language interpreter was used.    Past Medical History  Diagnosis Date  . Asthma   . Migraine   . Hypertension   . Acid reflux   . Obesity   . GERD (gastroesophageal reflux disease)     Past Surgical History  Procedure Laterality Date  . Tubal ligation    . Cholecystectomy    . Rotator cuff repair    . Hernia repair      x2    No family history on file.  History  Substance Use Topics  . Smoking status: Never Smoker   . Smokeless tobacco: Never Used  . Alcohol Use: No    OB History   Grav Para Term Preterm Abortions TAB SAB Ect Mult Living                  Review of Systems  All other systems reviewed and are negative.    Allergies  Vicodin  Home Medications   Current Outpatient Rx  Name  Route  Sig  Dispense  Refill  . doxycycline (VIBRAMYCIN) 100 MG capsule   Oral   Take 1 capsule (100 mg total) by mouth 2 (two) times daily.   20 capsule   0   . esomeprazole (NEXIUM) 40 MG capsule   Oral   Take 40 mg by mouth daily.         . fluticasone (FLONASE) 50 MCG/ACT nasal spray   Nasal  Place 2 sprays into the nose daily.   16 g   2   . ketorolac (TORADOL) 10 MG tablet   Oral   Take 1 tablet (10 mg total) by mouth every 6 (six) hours as needed for pain.   20 tablet   0   . EXPIRED: metoCLOPramide (REGLAN) 10 MG tablet   Oral   Take 1 tablet (10 mg total) by mouth every 6 (six) hours as needed (for nausea and/or vomiting).   10 tablet   0   . metoCLOPramide (REGLAN) 10 MG tablet      Take 1 tablet 4 times daily, before each meal and at bedtime.   60 tablet   0   . ondansetron (ZOFRAN ODT) 8 MG disintegrating tablet   Oral   Take 1 tablet (8 mg total) by mouth every 8 (eight) hours as needed for nausea.   20 tablet   0   . ondansetron (ZOFRAN) 4 MG tablet   Oral   Take 2 tablets (8 mg total) by mouth every 8 (eight) hours as needed for nausea.   12 tablet   0   . polyethylene  glycol powder (MIRALAX) powder   Oral   Take 17 g by mouth daily.   255 g   0   . sucralfate (CARAFATE) 1 G tablet   Oral   Take 1 tablet (1 g total) by mouth 4 (four) times daily.   60 tablet   0   . topiramate (TOPAMAX) 25 MG tablet   Oral   Take 25 mg by mouth at bedtime.         Marland Kitchen zolpidem (AMBIEN) 5 MG tablet   Oral   Take 1 tablet (5 mg total) by mouth at bedtime as needed for sleep.   15 tablet   0     BP 132/94  Pulse 68  Temp(Src) 97.6 F (36.4 C)  Resp 18  SpO2 100%  LMP 10/15/2012  Physical Exam  Nursing note and vitals reviewed. Constitutional: She is oriented to person, place, and time. She appears well-developed and well-nourished.  Very worse, speaks in a whisper  HENT:  Head: Normocephalic and atraumatic.  No signs of tonsillar or peritonsillar abscesses, oropharynx is clear, and without exudates, uvula is midline, airway is intact  Eyes: Conjunctivae and EOM are normal. Pupils are equal, round, and reactive to light.  Neck: Normal range of motion. Neck supple.  Cardiovascular: Normal rate and regular rhythm.  Exam reveals no gallop and  no friction rub.   No murmur heard. Pulmonary/Chest: Effort normal and breath sounds normal. No respiratory distress. She has no wheezes. She has no rales. She exhibits no tenderness.  No wheezes, rhonchi, or rales  Abdominal: Soft. Bowel sounds are normal. She exhibits no distension and no mass. There is no tenderness. There is no rebound and no guarding.  Musculoskeletal: Normal range of motion. She exhibits no edema and no tenderness.  Lymphadenopathy:    She has no cervical adenopathy.  Neurological: She is alert and oriented to person, place, and time.  Skin: Skin is warm and dry.  Psychiatric: She has a normal mood and affect. Her behavior is normal. Judgment and thought content normal.    ED Course  Procedures (including critical care time)  Results for orders placed during the hospital encounter of 10/03/12  POCT RAPID STREP A ([facility] ONLY)      Result Value Range   Streptococcus, Group A Screen (Direct) NEGATIVE  NEGATIVE   Dg Chest 2 View  10/23/2012  *RADIOLOGY REPORT*  Clinical Data: Cough and congestion for 3 weeks.  History of asthma.Short of breath.  CHEST - 2 VIEW  Comparison: 07/19/2012.  Findings:  Cardiopericardial silhouette within normal limits. Mediastinal contours normal. Trachea midline.  No airspace disease or effusion.  Question prior subacromial decompression on the right.  Left AC joint osteoarthritis is noted.  IMPRESSION: No active cardiopulmonary disease.   Original Report Authenticated By: Andreas Newport, M.D.       1. Hoarseness       MDM  Patient with 2 weeks of hoarseness.  Patient has GERD, but is taking Nexium.  She also has severe seasonal allergies, which is possibly causing her hoarseness.  She has tried taking doxycycline with no relief.  I am going to order a chest xray based on her complaint of productive cough, however, I expect that this will be negative.    I am going to treat the patient with a short course of PO steroids, and  will have her follow-up with ENT.  Discussed the patient and treatment plan with Dr. Lorenso Courier, who agrees with the  plan.  Patient is stable and ready for discharge.        Roxy Horseman, PA-C 10/23/12 1016

## 2012-11-13 ENCOUNTER — Encounter (HOSPITAL_COMMUNITY): Payer: Self-pay | Admitting: Emergency Medicine

## 2012-11-13 ENCOUNTER — Emergency Department (INDEPENDENT_AMBULATORY_CARE_PROVIDER_SITE_OTHER)
Admission: EM | Admit: 2012-11-13 | Discharge: 2012-11-13 | Disposition: A | Discharge status: Home / Self Care | Payer: BC Managed Care – PPO | Source: Home / Self Care | Attending: Family Medicine | Admitting: Family Medicine

## 2012-11-13 DIAGNOSIS — R51 Headache: Secondary | ICD-10-CM

## 2012-11-13 MED ORDER — DIPHENHYDRAMINE HCL 50 MG/ML IJ SOLN
25.0000 mg | Freq: Once | INTRAMUSCULAR | Status: AC
  Administered 2012-11-13: 25 mg via INTRAMUSCULAR

## 2012-11-13 MED ORDER — KETOROLAC TROMETHAMINE 60 MG/2ML IM SOLN
INTRAMUSCULAR | Status: AC
  Filled 2012-11-13: qty 2

## 2012-11-13 MED ORDER — DIPHENHYDRAMINE HCL 25 MG PO CAPS
ORAL_CAPSULE | ORAL | Status: AC
  Filled 2012-11-13: qty 1

## 2012-11-13 MED ORDER — DIPHENHYDRAMINE HCL 50 MG/ML IJ SOLN
INTRAMUSCULAR | Status: AC
  Filled 2012-11-13: qty 1

## 2012-11-13 MED ORDER — METOCLOPRAMIDE HCL 5 MG/ML IJ SOLN
10.0000 mg | Freq: Once | INTRAMUSCULAR | Status: AC
  Administered 2012-11-13: 10 mg via INTRAMUSCULAR

## 2012-11-13 MED ORDER — METOCLOPRAMIDE HCL 5 MG/ML IJ SOLN
INTRAMUSCULAR | Status: AC
  Filled 2012-11-13: qty 2

## 2012-11-13 MED ORDER — KETOROLAC TROMETHAMINE 60 MG/2ML IM SOLN
60.0000 mg | Freq: Once | INTRAMUSCULAR | Status: AC
  Administered 2012-11-13: 60 mg via INTRAMUSCULAR

## 2012-11-13 NOTE — ED Provider Notes (Signed)
History     CSN: [id number]  Arrival date & time 11/13/12  1015   First MD Initiated Contact with Patient 11/13/12 1234      Chief Complaint  Patient presents with  . URI    (Consider location/radiation/quality/duration/timing/severity/associated sxs/prior treatment) HPI  Past Medical History  Diagnosis Date  . Asthma   . Migraine   . Hypertension   . Acid reflux   . Obesity   . GERD (gastroesophageal reflux disease)     Past Surgical History  Procedure Laterality Date  . Tubal ligation    . Cholecystectomy    . Rotator cuff repair    . Hernia repair      x2    No family history on file.  History  Substance Use Topics  . Smoking status: Never Smoker   . Smokeless tobacco: Never Used  . Alcohol Use: No    OB History   Grav Para Term Preterm Abortions TAB SAB Ect Mult Living                  Review of Systems  Allergies  Vicodin  Home Medications   Current Outpatient Rx  Name  Route  Sig  Dispense  Refill  . EXPIRED: esomeprazole (NEXIUM) 40 MG capsule   Oral   Take 40 mg by mouth daily.         . fluticasone (FLONASE) 50 MCG/ACT nasal spray   Nasal   Place 2 sprays into the nose daily.   16 g   2   . predniSONE (DELTASONE) 20 MG tablet   Oral   Take 2 tablets (40 mg total) by mouth daily.   10 tablet   0   . topiramate (TOPAMAX) 25 MG tablet   Oral   Take 25 mg by mouth at bedtime.           BP 152/106  Pulse 73  Temp(Src) 98 F (36.7 C) (Oral)  Resp 16  SpO2 95%  LMP 11/07/2012  Physical Exam  ED Course  Procedures (including critical care time)  Labs Reviewed - No data to display No results found.   1. Headache       MDM          Elson Areas, PA-C 11/13/12 1521

## 2012-11-13 NOTE — ED Provider Notes (Signed)
Medical screening examination/treatment/procedure(s) were performed by non-physician practitioner and as supervising physician I was immediately available for consultation/collaboration.  Raynald Blend, MD 11/13/12 [phone]

## 2012-11-13 NOTE — ED Provider Notes (Signed)
Medical screening examination/treatment/procedure(s) were performed by non-physician practitioner and as supervising physician I was immediately available for consultation/collaboration.  Shaquill Iseman   Sanchez Hemmer, MD 11/13/12 1225 

## 2012-11-13 NOTE — ED Provider Notes (Deleted)
History     CSN: [id number]  Arrival date & time 11/13/12  1015   None     Chief Complaint  Patient presents with  . URI    (Consider location/radiation/quality/duration/timing/severity/associated sxs/prior treatment) Patient is a 47 y.o. female presenting with headaches. The history is provided by the patient. No language interpreter was used.  Headache Pain location:  Frontal Radiates to:  Face Severity at highest:  9/10 Onset quality:  Gradual Timing:  Constant Progression:  Worsening Chronicity:  New Similar to prior headaches: yes   Relieved by:  Nothing Worsened by:  Nothing tried Ineffective treatments:  Prescription medications Associated symptoms: blurred vision   Risk factors: no anger     Past Medical History  Diagnosis Date  . Asthma   . Migraine   . Hypertension   . Acid reflux   . Obesity   . GERD (gastroesophageal reflux disease)     Past Surgical History  Procedure Laterality Date  . Tubal ligation    . Cholecystectomy    . Rotator cuff repair    . Hernia repair      x2    No family history on file.  History  Substance Use Topics  . Smoking status: Never Smoker   . Smokeless tobacco: Never Used  . Alcohol Use: No    OB History   Grav Para Term Preterm Abortions TAB SAB Ect Mult Living                  Review of Systems  Eyes: Positive for blurred vision.  Neurological: Positive for headaches.  All other systems reviewed and are negative.    Allergies  Vicodin  Home Medications   Current Outpatient Rx  Name  Route  Sig  Dispense  Refill  . EXPIRED: esomeprazole (NEXIUM) 40 MG capsule   Oral   Take 40 mg by mouth daily.         . fluticasone (FLONASE) 50 MCG/ACT nasal spray   Nasal   Place 2 sprays into the nose daily.   16 g   2   . predniSONE (DELTASONE) 20 MG tablet   Oral   Take 2 tablets (40 mg total) by mouth daily.   10 tablet   0   . topiramate (TOPAMAX) 25 MG tablet   Oral   Take 25 mg by mouth  at bedtime.           BP 152/106  Pulse 73  Temp(Src) 98 F (36.7 C) (Oral)  Resp 16  SpO2 95%  LMP 11/07/2012  Physical Exam  Nursing note and vitals reviewed. Constitutional: She is oriented to person, place, and time. She appears well-developed and well-nourished.  HENT:  Head: Normocephalic and atraumatic.  Right Ear: External ear normal.  Left Ear: External ear normal.  Nose: Nose normal.  Mouth/Throat: Oropharynx is clear and moist.  Eyes: Conjunctivae and EOM are normal. Pupils are equal, round, and reactive to light.  Neck: Normal range of motion. Neck supple.  Cardiovascular: Normal rate and normal heart sounds.   Pulmonary/Chest: Effort normal and breath sounds normal.  Abdominal: Soft. She exhibits no distension.  Musculoskeletal: Normal range of motion.  Neurological: She is alert and oriented to person, place, and time.  Skin: Skin is warm.  Psychiatric: She has a normal mood and affect.    ED Course  Procedures (including critical care time)  Labs Reviewed - No data to display No results found.   No diagnosis found.  MDM  Pt given [facility] [place] cocktail,  Reglan, torodol and benadryl.           Lonia Skinner [place], PA-C 11/13/12 1213  Lonia Skinner [place], [place] 11/13/12 1520

## 2012-11-13 NOTE — ED Notes (Addendum)
Pt c/o cold/allergy sx onset 3 days Sx include: HA, vomiting, nauseas, diarrhea, blurry vision, itchy/watery eyes, cough w/green phlegm Denies: fevers Taking Topamax and Allegra for sx w/little relief Hx of allergies and Migraines  She is alert and oriented w/no signs of acute distress.

## 2012-11-27 ENCOUNTER — Emergency Department (HOSPITAL_COMMUNITY): Payer: BC Managed Care – PPO

## 2012-11-27 ENCOUNTER — Emergency Department (HOSPITAL_COMMUNITY)
Admission: EM | Admit: 2012-11-27 | Discharge: 2012-11-27 | Disposition: A | Discharge status: Home / Self Care | Payer: BC Managed Care – PPO | Attending: Emergency Medicine | Admitting: Emergency Medicine

## 2012-11-27 ENCOUNTER — Encounter (HOSPITAL_COMMUNITY): Payer: Self-pay | Admitting: *Deleted

## 2012-11-27 DIAGNOSIS — W108XXA Fall (on) (from) other stairs and steps, initial encounter: Secondary | ICD-10-CM | POA: Insufficient documentation

## 2012-11-27 DIAGNOSIS — E669 Obesity, unspecified: Secondary | ICD-10-CM | POA: Insufficient documentation

## 2012-11-27 DIAGNOSIS — I10 Essential (primary) hypertension: Secondary | ICD-10-CM | POA: Insufficient documentation

## 2012-11-27 DIAGNOSIS — Y9389 Activity, other specified: Secondary | ICD-10-CM | POA: Insufficient documentation

## 2012-11-27 DIAGNOSIS — M79674 Pain in right toe(s): Secondary | ICD-10-CM

## 2012-11-27 DIAGNOSIS — J45909 Unspecified asthma, uncomplicated: Secondary | ICD-10-CM | POA: Insufficient documentation

## 2012-11-27 DIAGNOSIS — S79919A Unspecified injury of unspecified hip, initial encounter: Secondary | ICD-10-CM | POA: Insufficient documentation

## 2012-11-27 DIAGNOSIS — M25561 Pain in right knee: Secondary | ICD-10-CM

## 2012-11-27 DIAGNOSIS — G43909 Migraine, unspecified, not intractable, without status migrainosus: Secondary | ICD-10-CM | POA: Insufficient documentation

## 2012-11-27 DIAGNOSIS — S79929A Unspecified injury of unspecified thigh, initial encounter: Secondary | ICD-10-CM | POA: Insufficient documentation

## 2012-11-27 DIAGNOSIS — M25551 Pain in right hip: Secondary | ICD-10-CM

## 2012-11-27 DIAGNOSIS — K219 Gastro-esophageal reflux disease without esophagitis: Secondary | ICD-10-CM | POA: Insufficient documentation

## 2012-11-27 DIAGNOSIS — Z79899 Other long term (current) drug therapy: Secondary | ICD-10-CM | POA: Insufficient documentation

## 2012-11-27 DIAGNOSIS — W19XXXA Unspecified fall, initial encounter: Secondary | ICD-10-CM

## 2012-11-27 DIAGNOSIS — S8990XA Unspecified injury of unspecified lower leg, initial encounter: Secondary | ICD-10-CM | POA: Insufficient documentation

## 2012-11-27 DIAGNOSIS — Y929 Unspecified place or not applicable: Secondary | ICD-10-CM | POA: Insufficient documentation

## 2012-11-27 MED ORDER — TRAMADOL HCL 50 MG PO TABS: 50.0000 mg | ORAL_TABLET | Freq: Four times a day (QID) | ORAL | Status: DC | PRN

## 2012-11-27 NOTE — ED Notes (Signed)
Pt from home with reports of falling over loose stairs this morning around 0715. Pt reports falling down 4 steps and landing on wood flooring at the bottom of the steps on right side of body. Pt reports right hip, knee and right, great toe pain. Pt denies LOC or hitting head. Pt observed walking with limp, no obvious deformity or rotation/shortening of right leg noted.

## 2012-11-27 NOTE — ED Provider Notes (Signed)
Medical screening examination/treatment/procedure(s) were performed by non-physician practitioner and as supervising physician I was immediately available for consultation/collaboration.   Council Munguia H Yassine Brunsman, MD 11/27/12 2326 

## 2012-11-27 NOTE — ED Provider Notes (Signed)
History    This chart was scribed for non-physician practitioner Junious Silk PA-C working with Richardean Canal, MD by Smitty Pluck, ED scribe. This patient was seen in room WTR5/WTR5 and the patient's care was started at 7:26 PM.   CSN: [id number]  Arrival date & time 11/27/12  1740      Chief Complaint  Patient presents with  . Fall  . Knee Pain    right  . Hip Pain    right  . Toe Pain    Right, great    The history is provided by the patient and medical records. No language interpreter was used.   HPI Comments: [name] is a 47 y.o. female who presents to the Emergency Department complaining of fall today down 4 steps today. Pt reports that her shoe become caught on the cracked step and fell landing on right knee and right hip. She reports that she had constant, moderate right knee pain, throbbing hip pain that radiates down lateral right thigh and right great toe pain. Pt denies head injury, LOC, fever, chills, nausea, vomiting, diarrhea, weakness, cough, SOB, numbness and any other pain.   Past Medical History  Diagnosis Date  . Asthma   . Migraine   . Hypertension   . Acid reflux   . Obesity   . GERD (gastroesophageal reflux disease)     Past Surgical History  Procedure Laterality Date  . Tubal ligation    . Cholecystectomy    . Rotator cuff repair    . Hernia repair      x2    History reviewed. No pertinent family history.  History  Substance Use Topics  . Smoking status: Never Smoker   . Smokeless tobacco: Never Used  . Alcohol Use: No    OB History   Grav Para Term Preterm Abortions TAB SAB Ect Mult Living                  Review of Systems  Constitutional: Negative for fever and chills.  Respiratory: Negative for shortness of breath.   Gastrointestinal: Negative for nausea and vomiting.  Musculoskeletal: Positive for arthralgias.  Neurological: Negative for weakness.  All other systems reviewed and are negative.    Allergies   Vicodin  Home Medications   Current Outpatient Rx  Name  Route  Sig  Dispense  Refill  . EXPIRED: esomeprazole (NEXIUM) 40 MG capsule   Oral   Take 40 mg by mouth daily.         . fluticasone (FLONASE) 50 MCG/ACT nasal spray   Nasal   Place 2 sprays into the nose daily.   16 g   2   . predniSONE (DELTASONE) 20 MG tablet   Oral   Take 2 tablets (40 mg total) by mouth daily.   10 tablet   0   . topiramate (TOPAMAX) 25 MG tablet   Oral   Take 25 mg by mouth at bedtime.           BP 136/95  Pulse 88  Temp(Src) 98.7 F (37.1 C) (Oral)  Resp 18  Ht 5\' 7"  (1.702 m)  Wt 300 lb (136.079 kg)  BMI 46.98 kg/m2  SpO2 99%  LMP 11/07/2012  Physical Exam  Nursing note and vitals reviewed. Constitutional: She is oriented to person, place, and time. She appears well-developed and well-nourished. No distress.  HENT:  Head: Normocephalic and atraumatic.  Right Ear: External ear normal.  Left Ear: External ear normal.  Nose: Nose normal.  Mouth/Throat: Oropharynx is clear and moist.  Eyes: Conjunctivae are normal.  Neck: Normal range of motion.  Cardiovascular: Normal rate, regular rhythm and normal heart sounds.   Pulmonary/Chest: Effort normal and breath sounds normal. No stridor. No respiratory distress. She has no wheezes. She has no rales.  Abdominal: Soft. She exhibits no distension.  Musculoskeletal:  Tenderness to palpation in right great toe Neurovascularly intact Tenderness over patellar tendon in right knee Tenderness over lateral right hip  Cap refill less than 3 seconds   Neurological: She is alert and oriented to person, place, and time. She has normal strength.  Skin: Skin is warm and dry. She is not diaphoretic. No erythema.  Psychiatric: She has a normal mood and affect. Her behavior is normal.    ED Course  Procedures (including critical care time) DIAGNOSTIC STUDIES: Oxygen Saturation is 99% on room air, normal by my interpretation.     COORDINATION OF CARE: 7:28 PM Discussed ED treatment with pt and pt agrees.     Labs Reviewed - No data to display Dg Hip Complete Right  11/27/2012   *RADIOLOGY REPORT*  Clinical Data: Larey Seat down stairs, right hip pain  RIGHT HIP - COMPLETE 2+ VIEW  Comparison: None  Findings: Bilateral hip joint space narrowing. SI joints preserved. Numerous pelvic phleboliths. No acute fracture, dislocation or bone destruction.  IMPRESSION: No acute osseous abnormalities. Degenerative changes of bilateral hip joints.   Original Report Authenticated By: Ulyses Southward, M.D.   Dg Knee Complete 4 Views Right  11/27/2012   *RADIOLOGY REPORT*  Clinical Data: Larey Seat down stairs, right knee pain  RIGHT KNEE - COMPLETE 4+ VIEW  Comparison: 06/15/2012  Findings: Osseous demineralization. Tricompartmental osteoarthritic changes with joint space narrowing and marginal spur formation. No acute fracture, dislocation, or bone destruction. Subtle linear lucency at the lateral distal femoral metaphysis on the AP view is unchanged since 06/15/2012. No definite knee joint effusion.  IMPRESSION: Tricompartmental osteoarthritic changes. No acute abnormalities.   Original Report Authenticated By: Ulyses Southward, M.D.   Dg Foot Complete Right  11/27/2012   *RADIOLOGY REPORT*  Clinical Data: Larey Seat down stairs, right foot pain  RIGHT FOOT COMPLETE - 3+ VIEW  Comparison: 08/15/2010  Findings: Large Achilles insertion calcaneal spur. Osseous mineralization normal. Joint spaces preserved. No acute fracture, dislocation, or bone destruction.  IMPRESSION: No acute osseous abnormalities.   Original Report Authenticated By: Ulyses Southward, M.D.     1. Fall, initial encounter   2. Knee pain, right   3. Toe pain, right   4. Hip pain, right       MDM  Patient presents after a mechanical fall earlier today. X-rays negative for fracture. She was given a small course of pain medication. Discussed to rest, ice, NSAIDs. She was given a resource guide to  establish care with them the community. Return instructions given. Vital signs stable for discharge.Patient / Family / Caregiver informed of clinical course, understand medical decision-making process, and agree with plan.      I personally performed the services described in this documentation, which was scribed in my presence. The recorded information has been reviewed and is accurate.    Mora Bellman, PA-C 11/27/12 2032

## 2012-12-05 ENCOUNTER — Emergency Department (INDEPENDENT_AMBULATORY_CARE_PROVIDER_SITE_OTHER)
Admission: EM | Admit: 2012-12-05 | Discharge: 2012-12-05 | Disposition: A | Discharge status: Home / Self Care | Payer: BC Managed Care – PPO | Source: Home / Self Care | Attending: Emergency Medicine | Admitting: Emergency Medicine

## 2012-12-05 ENCOUNTER — Encounter (HOSPITAL_COMMUNITY): Payer: Self-pay | Admitting: Emergency Medicine

## 2012-12-05 DIAGNOSIS — S8391XD Sprain of unspecified site of right knee, subsequent encounter: Secondary | ICD-10-CM

## 2012-12-05 DIAGNOSIS — S73101D Unspecified sprain of right hip, subsequent encounter: Secondary | ICD-10-CM

## 2012-12-05 DIAGNOSIS — IMO0002 Reserved for concepts with insufficient information to code with codable children: Secondary | ICD-10-CM

## 2012-12-05 MED ORDER — IBUPROFEN 800 MG PO TABS
800.0000 mg | ORAL_TABLET | Freq: Once | ORAL | Status: AC
  Administered 2012-12-05: 800 mg via ORAL

## 2012-12-05 MED ORDER — IBUPROFEN 800 MG PO TABS
ORAL_TABLET | ORAL | Status: AC
  Filled 2012-12-05: qty 1

## 2012-12-05 MED ORDER — TRAMADOL HCL 50 MG PO TABS: 100.0000 mg | ORAL_TABLET | Freq: Three times a day (TID) | ORAL | Status: DC | PRN

## 2012-12-05 NOTE — ED Notes (Signed)
Pt  Here  For    Recheck          Of  Her             r  Hip       And  r  Knee               Seen    Er                 Last  Week                    Had  X  Rays             At  That  Time

## 2012-12-05 NOTE — ED Provider Notes (Signed)
Chief Complaint:   Chief Complaint  Patient presents with  . Knee Pain    History of Present Illness:   [name] is a 47 year old female who one week ago fell down 4 steps at home. The steps were in poor repair and one of the stairs was cracked. She caught her shoe in the stair and fell, landing on her right side. She did not hit her head and there was no loss of consciousness. Thereafter she had pain in her right knee, right hip, and right foot. She went to the emergency room at [facility] where x-rays were taken of the hip, knee, and foot. These were all negative for fracture. She was given tramadol for the pain and given several days off work. She tried to go back to work this week, but was unable to do so because of pain. The pain in the foot has gone away. She now has pain in her hip and her knee. There is no swelling. She did have some bruising in the hip but this has gone away. The knee sometimes feels like it is about to give way. Her pain is worse if she walks, stands, or goes up and down steps, and better with ice. It's also been tramadol. The pain is rated as 7/10 in intensity. She has not appointment to see an orthopedist at [facility] orthopedics next week.  Review of Systems:  Other than noted above, the patient denies any of the following symptoms: Systemic:  No fevers, chills, sweats, or aches.  No fatigue or tiredness. Musculoskeletal:  No joint pain, arthritis, bursitis, swelling, back pain, or neck pain. Neurological:  No muscular weakness, paresthesias, headache, or trouble with speech or coordination.  No dizziness.  PMFSH:  Past medical history, family history, social history, meds, and allergies were reviewed.  She is allergic to Vicodin. She takes Topamax for migraine headache prevention.  Physical Exam:   Vital signs:  BP 142/93  Pulse 80  Temp(Src) 98.7 F (37.1 C) (Oral)  Resp 16  SpO2 100%  LMP 11/07/2012 Gen:  Alert and oriented times 3.  In no  distress. Musculoskeletal: No swelling, bruising, or deformity. There is pain to palpation over the lateral aspect of the hip. The hip has limited range of motion both actively and passively with pain. There is pain to palpation of the entire knee. No swelling or effusion is present. The knee has a limited range of motion and she's unable to flex beyond 90. She has pain with flexion. McMurray sign is negative, Lachman's sign is negative, anterior drawer sign is negative, and varus and valgus stress are negative.  Otherwise, all joints had a full a ROM with no swelling, bruising or deformity.  No edema, pulses full. Extremities were warm and pink.  Capillary refill was brisk.  Skin:  Clear, warm and dry.  No rash. Neuro:  Alert and oriented times 3.  Muscle strength was normal.  Sensation was intact to light touch.   Assessment:  The primary encounter diagnosis was Knee sprain, right, subsequent encounter. A diagnosis of Hip sprain, right, subsequent encounter was also pertinent to this visit.  Appears to have all soft tissue injuries. No evidence for fracture. She will need followup with an orthopedist. She was given a note to remain out of work until then. Suggested ice and rest, use of crutches and a knee immobilizer which she has at home. Given refill on tramadol for pain.  Plan:   1.  The following  meds were prescribed:   New Prescriptions   TRAMADOL (ULTRAM) 50 MG TABLET    Take 2 tablets (100 mg total) by mouth every 8 (eight) hours as needed for pain.   2.  The patient was instructed in symptomatic care, including rest and activity, elevation, application of ice and compression.  Appropriate handouts were given. 3.  The patient was told to return if becoming worse in any way, if no better in 3 or 4 days, and given some red flag symptoms such as worsening pain or neurological symptoms that would indicate earlier return.   4.  The patient was told to follow up with her orthopedist next  week.    Reuben Likes, MD 12/05/12 [phone]

## 2012-12-12 ENCOUNTER — Ambulatory Visit (INDEPENDENT_AMBULATORY_CARE_PROVIDER_SITE_OTHER): Payer: BC Managed Care – PPO | Admitting: Internal Medicine

## 2012-12-12 ENCOUNTER — Encounter: Payer: Self-pay | Admitting: Internal Medicine

## 2012-12-12 VITALS — BP 124/82 | HR 83 | Temp 98.7°F | Ht 65.5 in | Wt 301.0 lb

## 2012-12-12 DIAGNOSIS — Z0000 Encounter for general adult medical examination without abnormal findings: Secondary | ICD-10-CM | POA: Insufficient documentation

## 2012-12-12 DIAGNOSIS — J45909 Unspecified asthma, uncomplicated: Secondary | ICD-10-CM | POA: Insufficient documentation

## 2012-12-12 DIAGNOSIS — I10 Essential (primary) hypertension: Secondary | ICD-10-CM

## 2012-12-12 DIAGNOSIS — G43909 Migraine, unspecified, not intractable, without status migrainosus: Secondary | ICD-10-CM

## 2012-12-12 DIAGNOSIS — K219 Gastro-esophageal reflux disease without esophagitis: Secondary | ICD-10-CM | POA: Insufficient documentation

## 2012-12-12 DIAGNOSIS — J4520 Mild intermittent asthma, uncomplicated: Secondary | ICD-10-CM

## 2012-12-12 NOTE — Assessment & Plan Note (Signed)
Reports good  Control  with Topamax

## 2012-12-12 NOTE — Patient Instructions (Addendum)
Please come back fasting for labs: CMP, CBC, TSH---- dx  hypertension Hemoglobin A1c ---- dx hyperglycemia FLP --- dx screening for high cholesterol ---- Please schedule a visit to see her gynecologist and have a a mammogram. ---- Come back in 6 weeks, fasting for a physical exam.

## 2012-12-12 NOTE — Progress Notes (Signed)
  Subjective:    Patient ID: [name], female    DOB: August 08, 1965, 47 y.o.   MRN: [id number]  HPI New patient, here to establish. Had a knee injury, to see orthopedic surgery tomorrow. GERD, on Nexium, still has occasional symptoms. History of migraines, on Topamax, which works very well for her. History of asthma, on no medications, she only has symptoms when she has a URI.  Past Medical History  Diagnosis Date  . Asthma   . Migraine   . Hypertension   . Obesity   . GERD (gastroesophageal reflux disease)    Past Surgical History  Procedure Laterality Date  . Tubal ligation  1991  . Cholecystectomy  1990  . Rotator cuff repair  2006, 2207  . Hernia repair      x2   History   Social History  . Marital Status: Single    Spouse Name: N/A    Number of Children: 2  . Years of Education: N/A   Occupational History  . call center     Social History Main Topics  . Smoking status: Never Smoker   . Smokeless tobacco: Never Used  . Alcohol Use: No  . Drug Use: No  . Sexually Active: Yes    Birth Control/ Protection: None   Other Topics Concern  . Not on file   Social History Narrative   Household-- lives by herself    Family History  Problem Relation Age of Onset  . Colon cancer Neg Hx   . Breast cancer       GM  . Diabetes Father     F, B, S  . Pancreatic cancer      GM  . CAD Sister     MI at age 27  . CAD Brother     CHF, diet at age 58    Review of Systems No chest pain, shortness of breath. No nausea, vomiting, diarrhea.    Objective:   Physical Exam BP 124/82  Pulse 83  Temp(Src) 98.7 F (37.1 C) (Oral)  Ht 5' 5.5" (1.664 m)  Wt 301 lb (136.533 kg)  BMI 49.31 kg/m2  SpO2 97%  LMP 11/07/2012  General -- alert, well-developed,nad .   Lungs -- normal respiratory effort, no intercostal retractions, no accessory muscle use, and normal breath sounds.   Heart-- normal rate, regular rhythm, no murmur, and no gallop.   extremities-- no  pretibial edema bilaterally  Neurologic-- alert & oriented X3 and strength normal in all extremities. Psych-- Cognition and judgment appear intact. Alert and cooperative with normal attention span and concentration.  not anxious appearing and not depressed appearing.        Assessment & Plan:

## 2012-12-12 NOTE — Assessment & Plan Note (Signed)
Was told before that her BP was elevated but never taken medication. Labs

## 2012-12-12 NOTE — Assessment & Plan Note (Addendum)
New patient, here to get established, although I'm not doing a physical exam I have identified that she has a family history diabetes, breast cancer and CAD. Plan: Patient reports no recent mammogram, states she will schedule one. Labs to rule out high cholesterol, diabetes (noted hyperglycemia x 1 in the past).

## 2012-12-13 ENCOUNTER — Other Ambulatory Visit (INDEPENDENT_AMBULATORY_CARE_PROVIDER_SITE_OTHER): Payer: BC Managed Care – PPO

## 2012-12-13 ENCOUNTER — Encounter: Payer: Self-pay | Admitting: Internal Medicine

## 2012-12-13 DIAGNOSIS — R7309 Other abnormal glucose: Secondary | ICD-10-CM

## 2012-12-13 DIAGNOSIS — I10 Essential (primary) hypertension: Secondary | ICD-10-CM

## 2012-12-13 DIAGNOSIS — E7800 Pure hypercholesterolemia, unspecified: Secondary | ICD-10-CM

## 2012-12-13 LAB — CBC WITH DIFFERENTIAL/PLATELET
Basophils Absolute: 0.1 10*3/uL (ref 0.0–0.1)
Basophils Relative: 0.9 % (ref 0.0–3.0)
Eosinophils Absolute: 0.1 10*3/uL (ref 0.0–0.7)
Eosinophils Relative: 1.2 % (ref 0.0–5.0)
HCT: 36.2 % (ref 36.0–46.0)
Hemoglobin: 11.9 g/dL — ABNORMAL LOW (ref 12.0–15.0)
Lymphocytes Relative: 30.5 % (ref 12.0–46.0)
Lymphs Abs: 2.5 10*3/uL (ref 0.7–4.0)
MCHC: 33 g/dL (ref 30.0–36.0)
MCV: 85.2 fl (ref 78.0–100.0)
Monocytes Absolute: 0.5 10*3/uL (ref 0.1–1.0)
Monocytes Relative: 6 % (ref 3.0–12.0)
Neutro Abs: 5 10*3/uL (ref 1.4–7.7)
Neutrophils Relative %: 61.4 % (ref 43.0–77.0)
Platelets: 290 10*3/uL (ref 150.0–400.0)
RBC: 4.25 Mil/uL (ref 3.87–5.11)
RDW: 15.2 % — ABNORMAL HIGH (ref 11.5–14.6)
WBC: 8.1 10*3/uL (ref 4.5–10.5)

## 2012-12-13 LAB — LIPID PANEL
Cholesterol: 168 mg/dL (ref 0–200)
HDL: 40.2 mg/dL (ref >39.00)
LDL Cholesterol: 110 mg/dL — ABNORMAL HIGH (ref 0–99)
Total CHOL/HDL Ratio: 4
Triglycerides: 88 mg/dL (ref 0.0–149.0)
VLDL: 17.6 mg/dL (ref 0.0–40.0)

## 2012-12-13 LAB — COMPREHENSIVE METABOLIC PANEL
ALT: 10 U/L (ref 0–35)
AST: 12 U/L (ref 0–37)
Albumin: 3.2 g/dL — ABNORMAL LOW (ref 3.5–5.2)
Alkaline Phosphatase: 51 U/L (ref 39–117)
BUN: 12 mg/dL (ref 6–23)
CO2: 21 mEq/L (ref 19–32)
Calcium: 8.7 mg/dL (ref 8.4–10.5)
Chloride: 109 mEq/L (ref 96–112)
Creatinine, Ser: 0.8 mg/dL (ref 0.4–1.2)
GFR: 103.65 mL/min (ref >60.00)
Glucose, Bld: 108 mg/dL — ABNORMAL HIGH (ref 70–99)
Potassium: 3.7 mEq/L (ref 3.5–5.1)
Sodium: 138 mEq/L (ref 135–145)
Total Bilirubin: 0.3 mg/dL (ref 0.3–1.2)
Total Protein: 7 g/dL (ref 6.0–8.3)

## 2012-12-13 LAB — HEMOGLOBIN A1C: Hgb A1c MFr Bld: 6.3 % (ref 4.6–6.5)

## 2012-12-13 LAB — TSH: TSH: 0.8 u[IU]/mL (ref 0.35–5.50)

## 2012-12-18 ENCOUNTER — Telehealth: Payer: Self-pay | Admitting: *Deleted

## 2012-12-18 ENCOUNTER — Encounter: Payer: Self-pay | Admitting: *Deleted

## 2012-12-18 DIAGNOSIS — E119 Type 2 diabetes mellitus without complications: Secondary | ICD-10-CM

## 2012-12-18 MED ORDER — METFORMIN HCL 500 MG PO TABS: ORAL_TABLET | ORAL | Status: DC

## 2012-12-18 NOTE — Telephone Encounter (Signed)
Rx sent, Discuss with patient referral placed.

## 2012-12-18 NOTE — Telephone Encounter (Signed)
Message copied by Verdene Rio on Wed Dec 18, 2012  5:51 PM ------      Message from: Willow Ora E      Created: Tue Dec 17, 2012  5:22 PM       Advise patient      Her thyroid, liver and kidney tests are normal.      She does have borderline diabetes, recommend to work on her diet, arrange a nutritionist referral if pt so desire.Also needs to stay active, try to exercise at least 30 minutes a day.      Start metformin 500 mg one tablet twice a day (first week take only one tablet daily) #60 and one refill.      Followup in 6 weeks as recommended.      Her cholesterol is okay for now, we'll recheck in few months. ------

## 2013-02-06 ENCOUNTER — Ambulatory Visit: Payer: BC Managed Care – PPO

## 2013-02-14 ENCOUNTER — Encounter: Payer: Self-pay | Admitting: Internal Medicine

## 2013-02-14 ENCOUNTER — Ambulatory Visit (INDEPENDENT_AMBULATORY_CARE_PROVIDER_SITE_OTHER): Payer: BC Managed Care – PPO | Admitting: Internal Medicine

## 2013-02-14 VITALS — BP 142/100 | HR 82 | Temp 98.1°F | Ht 65.5 in | Wt 301.6 lb

## 2013-02-14 DIAGNOSIS — E119 Type 2 diabetes mellitus without complications: Secondary | ICD-10-CM

## 2013-02-14 DIAGNOSIS — D649 Anemia, unspecified: Secondary | ICD-10-CM | POA: Insufficient documentation

## 2013-02-14 DIAGNOSIS — Z23 Encounter for immunization: Secondary | ICD-10-CM

## 2013-02-14 DIAGNOSIS — Z0000 Encounter for general adult medical examination without abnormal findings: Secondary | ICD-10-CM

## 2013-02-14 DIAGNOSIS — I10 Essential (primary) hypertension: Secondary | ICD-10-CM

## 2013-02-14 LAB — AST: AST: 15 U/L (ref 0–37)

## 2013-02-14 LAB — MICROALBUMIN / CREATININE URINE RATIO
Creatinine,U: 140.1 mg/dL
Microalb Creat Ratio: 2.1 mg/g (ref 0.0–30.0)
Microalb, Ur: 2.9 mg/dL — ABNORMAL HIGH (ref 0.0–1.9)

## 2013-02-14 LAB — HEMOGLOBIN: Hemoglobin: 12.3 g/dL (ref 12.0–15.0)

## 2013-02-14 LAB — FERRITIN: Ferritin: 24.1 ng/mL (ref 10.0–291.0)

## 2013-02-14 LAB — IRON: Iron: 50 ug/dL (ref 42–145)

## 2013-02-14 LAB — GLUCOSE, POCT (MANUAL RESULT ENTRY): POC Glucose: 104 mg/dl — AB (ref 70–99)

## 2013-02-14 LAB — ALT: ALT: 22 U/L (ref 0–35)

## 2013-02-14 NOTE — Assessment & Plan Note (Signed)
Noted to have mild anemia, will check a hemoglobin, iron and ferritin.

## 2013-02-14 NOTE — Patient Instructions (Signed)
Next visit in 3 months, please make an appointment

## 2013-02-14 NOTE — Assessment & Plan Note (Signed)
Reports she occasionally has elevated BP, BP last ok, today slightly high. Plan: Low-salt diet and recheck on every visit

## 2013-02-14 NOTE — Assessment & Plan Note (Addendum)
Tdap ~ 10 years and today Never had a cscope EKG-- nsr + family history diabetes, breast cancer and CAD. To see gyn in 2 weeks, states they will set up MMG  Labs Diet-exercise discussed

## 2013-02-14 NOTE — Assessment & Plan Note (Addendum)
Diagnosed with diabetes 2 months ago, started metformin, not ambulatory CBGs. Nutritionist visit pending. Unable to exercise for now d/t knee pain CBG today fasting----- 104 Plan: Check LFTs , microalbumin and come back in 2 or 3 months. Continue metformin.

## 2013-02-14 NOTE — Progress Notes (Signed)
  Subjective:    Patient ID: [name], female    DOB: [birth year], 47 y.o.   MRN: [id number]  HPI CPX  Past Medical History  Diagnosis Date  . Asthma   . Migraine   . Hypertension   . Obesity   . GERD (gastroesophageal reflux disease)   . Diabetes 02/14/2013   Past Surgical History  Procedure Laterality Date  . Tubal ligation  1991  . Cholecystectomy  1990  . Rotator cuff repair  2006, 2007  . Umbilical hernia repair      4098,1191   History   Social History  . Marital Status: Single    Spouse Name: N/A    Number of Children: 2  . Years of Education: N/A   Occupational History  . call center     Social History Main Topics  . Smoking status: Never Smoker   . Smokeless tobacco: Never Used  . Alcohol Use: No  . Drug Use: No  . Sexually Active: Yes    Birth Control/ Protection: None   Other Topics Concern  . Not on file   Social History Narrative   Household-- lives by herself    Children grown    Family History  Problem Relation Age of Onset  . Colon cancer Neg Hx   . Breast cancer       GM  . Diabetes Father     F, B, S  . Pancreatic cancer      GM  . CAD Sister     MI at age 2  . CAD Brother     CHF, diet at age 70     Review of Systems Since the last time she was here, she was diagnosed with diabetes, unable to exercise, still has knee pain. Has not changed her diet much. Denies any blurred vision or lower extremity paresthesias. Takes metformin well, occasionally diarrhea but that Is getting less frequent. Denies chest pain, shortness of breath. No nausea, vomiting, blood in the stools. No dysuria or gross hematuria. Periods are monthly, last 5-6 days, the first 3 days are heavy.     Objective:   Physical Exam BP 142/100  Pulse 82  Temp(Src) 98.1 F (36.7 C) (Oral)  Ht 5' 5.5" (1.664 m)  Wt 301 lb 9.6 oz (136.805 kg)  BMI 49.41 kg/m2  SpO2 96%  General -- alert, well-developed,NAD.   Neck --no thyromegaly  HEENT -- slt  pale? Lungs -- normal respiratory effort, no intercostal retractions, no accessory muscle use, and normal breath sounds.   Heart-- normal rate, regular rhythm, no murmur, and no gallop.   Abdomen--soft, non-tender, no distention, no masses, no HSM, no guarding, and no rigidity.   Extremities-- no pretibial edema bilaterally Neurologic-- alert & oriented X3 and strength normal in all extremities. Psych-- Cognition and judgment appear intact. Alert and cooperative with normal attention span and concentration.  not anxious appearing and not depressed appearing.        Assessment & Plan:

## 2013-02-16 ENCOUNTER — Encounter: Payer: Self-pay | Admitting: Internal Medicine

## 2013-02-17 ENCOUNTER — Encounter: Payer: Self-pay | Admitting: *Deleted

## 2013-03-14 ENCOUNTER — Emergency Department (HOSPITAL_COMMUNITY)
Admission: EM | Admit: 2013-03-14 | Discharge: 2013-03-14 | Disposition: A | Discharge status: Home / Self Care | Payer: BC Managed Care – PPO | Source: Home / Self Care | Attending: Family Medicine | Admitting: Family Medicine

## 2013-03-14 ENCOUNTER — Encounter (HOSPITAL_COMMUNITY): Payer: Self-pay | Admitting: Emergency Medicine

## 2013-03-14 DIAGNOSIS — G44209 Tension-type headache, unspecified, not intractable: Secondary | ICD-10-CM

## 2013-03-14 MED ORDER — AMITRIPTYLINE HCL 50 MG PO TABS: 50.0000 mg | ORAL_TABLET | Freq: Every day | ORAL | Status: DC

## 2013-03-14 MED ORDER — ONDANSETRON 4 MG PO TBDP
8.0000 mg | ORAL_TABLET | Freq: Once | ORAL | Status: AC
  Administered 2013-03-14: 8 mg via ORAL

## 2013-03-14 MED ORDER — KETOROLAC TROMETHAMINE 30 MG/ML IJ SOLN
INTRAMUSCULAR | Status: AC
  Filled 2013-03-14: qty 1

## 2013-03-14 MED ORDER — ONDANSETRON 4 MG PO TBDP
ORAL_TABLET | ORAL | Status: AC
  Filled 2013-03-14: qty 2

## 2013-03-14 MED ORDER — KETOROLAC TROMETHAMINE 30 MG/ML IJ SOLN
30.0000 mg | Freq: Once | INTRAMUSCULAR | Status: AC
  Administered 2013-03-14: 30 mg via INTRAMUSCULAR

## 2013-03-14 NOTE — ED Notes (Signed)
Reports "migraine" started yesterday.  History of the same.  Denies recent illness, but admits to stress.  Pain in neck, top of head and left head.

## 2013-03-14 NOTE — ED Provider Notes (Signed)
CSN: [id number]     Arrival date & time 03/14/13  1342 History   First MD Initiated Contact with Patient 03/14/13 1455     Chief Complaint  Patient presents with  . Headache   (Consider location/radiation/quality/duration/timing/severity/associated sxs/prior Treatment) Patient is a 47 y.o. female presenting with headaches. The history is provided by the patient.  Headache Pain location:  Occipital, L temporal and R temporal Quality:  Sharp Radiates to:  Does not radiate Onset quality:  Gradual Duration:  1 day Timing:  Constant Progression:  Unchanged Chronicity:  New Similar to prior headaches: yes   Context: emotional stress   Associated symptoms: no abdominal pain, no back pain, no blurred vision, no congestion, no cough, no dizziness, no ear pain, no pain, no facial pain, no fever, no focal weakness, no loss of balance, no numbness, no paresthesias and no photophobia     Past Medical History  Diagnosis Date  . Asthma   . Migraine   . Hypertension   . Obesity   . GERD (gastroesophageal reflux disease)   . Diabetes 02/14/2013   Past Surgical History  Procedure Laterality Date  . Tubal ligation  1991  . Cholecystectomy  1990  . Rotator cuff repair  2006, 2007  . Umbilical hernia repair      4098,1191   Family History  Problem Relation Age of Onset  . Colon cancer Neg Hx   . Breast cancer       GM  . Diabetes Father     F, B, S  . Pancreatic cancer      GM  . CAD Sister     MI at age 70  . CAD Brother     CHF, diet at age 45   History  Substance Use Topics  . Smoking status: Never Smoker   . Smokeless tobacco: Never Used  . Alcohol Use: No   OB History   Grav Para Term Preterm Abortions TAB SAB Ect Mult Living                 Review of Systems  Constitutional: Negative.  Negative for fever.  HENT: Negative for ear pain and congestion.   Eyes: Negative for blurred vision, photophobia and pain.  Respiratory: Negative for cough.   Gastrointestinal:  Negative for abdominal pain.  Musculoskeletal: Negative for back pain.  Neurological: Positive for headaches. Negative for dizziness, focal weakness, syncope, speech difficulty, weakness, numbness, paresthesias and loss of balance.    Allergies  Vicodin  Home Medications   Current Outpatient Rx  Name  Route  Sig  Dispense  Refill  . amitriptyline (ELAVIL) 50 MG tablet   Oral   Take 1 tablet (50 mg total) by mouth at bedtime.   15 tablet   0   . EXPIRED: esomeprazole (NEXIUM) 40 MG capsule   Oral   Take 40 mg by mouth daily.         . fexofenadine (ALLEGRA) 180 MG tablet   Oral   Take 180 mg by mouth daily.         . metFORMIN (GLUCOPHAGE) 500 MG tablet      Take 1 tab daily for 1 week then increase to 1 tab twice a day   60 tablet   1   . topiramate (TOPAMAX) 25 MG tablet   Oral   Take 50 mg by mouth at bedtime.          . traMADol (ULTRAM) 50 MG tablet   Oral  Take 1 tablet (50 mg total) by mouth every 6 (six) hours as needed for pain.   15 tablet   0    BP 140/91  Pulse 79  Temp(Src) 98.2 F (36.8 C) (Oral)  Resp 20  SpO2 95%  LMP 03/02/2013 Physical Exam  Nursing note and vitals reviewed. Constitutional: She is oriented to person, place, and time. She appears well-developed and well-nourished.  HENT:  Head: Normocephalic.  Right Ear: External ear normal.  Left Ear: External ear normal.  Mouth/Throat: Oropharynx is clear and moist.  Eyes: Conjunctivae are normal. Pupils are equal, round, and reactive to light.  Neck: Normal range of motion. Neck supple. No spinous process tenderness and no muscular tenderness present. Carotid bruit is not present. No rigidity.  Cardiovascular: Regular rhythm.   Lymphadenopathy:    She has no cervical adenopathy.  Neurological: She is alert and oriented to person, place, and time. No cranial nerve deficit. Coordination normal.  Skin: Skin is warm and dry.    ED Course  Procedures (including critical care  time) Labs Review Labs Reviewed - No data to display Imaging Review No results found.  MDM   1. Tension type headache        Linna Hoff, MD 03/14/13 [id number]

## 2013-04-14 ENCOUNTER — Observation Stay (HOSPITAL_COMMUNITY)
Admission: EM | Admit: 2013-04-14 | Discharge: 2013-04-15 | Disposition: A | Discharge status: Home / Self Care | Payer: BC Managed Care – PPO | Attending: Cardiology | Admitting: Cardiology

## 2013-04-14 ENCOUNTER — Emergency Department (HOSPITAL_COMMUNITY): Payer: BC Managed Care – PPO

## 2013-04-14 ENCOUNTER — Encounter (HOSPITAL_COMMUNITY): Payer: Self-pay | Admitting: Emergency Medicine

## 2013-04-14 DIAGNOSIS — Z8249 Family history of ischemic heart disease and other diseases of the circulatory system: Secondary | ICD-10-CM | POA: Insufficient documentation

## 2013-04-14 DIAGNOSIS — R079 Chest pain, unspecified: Secondary | ICD-10-CM

## 2013-04-14 DIAGNOSIS — R0789 Other chest pain: Secondary | ICD-10-CM

## 2013-04-14 DIAGNOSIS — G43909 Migraine, unspecified, not intractable, without status migrainosus: Secondary | ICD-10-CM | POA: Insufficient documentation

## 2013-04-14 DIAGNOSIS — K219 Gastro-esophageal reflux disease without esophagitis: Secondary | ICD-10-CM | POA: Diagnosis present

## 2013-04-14 DIAGNOSIS — I10 Essential (primary) hypertension: Secondary | ICD-10-CM | POA: Diagnosis present

## 2013-04-14 DIAGNOSIS — E119 Type 2 diabetes mellitus without complications: Secondary | ICD-10-CM | POA: Diagnosis present

## 2013-04-14 DIAGNOSIS — R072 Precordial pain: Principal | ICD-10-CM | POA: Insufficient documentation

## 2013-04-14 LAB — CBC
HCT: 34.4 % — ABNORMAL LOW (ref 36.0–46.0)
Hemoglobin: 11.9 g/dL — ABNORMAL LOW (ref 12.0–15.0)
MCH: 27.8 pg (ref 26.0–34.0)
MCHC: 34.6 g/dL (ref 30.0–36.0)
MCV: 80.4 fL (ref 78.0–100.0)
Platelets: 324 10*3/uL (ref 150–400)
RBC: 4.28 MIL/uL (ref 3.87–5.11)
RDW: 14.7 % (ref 11.5–15.5)
WBC: 8.5 10*3/uL (ref 4.0–10.5)

## 2013-04-14 LAB — POCT I-STAT TROPONIN I: Troponin i, poc: 0 ng/mL (ref 0.00–0.08)

## 2013-04-14 LAB — BASIC METABOLIC PANEL
BUN: 8 mg/dL (ref 6–23)
CO2: 23 mEq/L (ref 19–32)
Calcium: 9 mg/dL (ref 8.4–10.5)
Chloride: 103 mEq/L (ref 96–112)
Creatinine, Ser: 0.66 mg/dL (ref 0.50–1.10)
GFR calc Af Amer: >90 mL/min (ref >90)
GFR calc non Af Amer: >90 mL/min (ref >90)
Glucose, Bld: 112 mg/dL — ABNORMAL HIGH (ref 70–99)
Potassium: 3.6 mEq/L (ref 3.5–5.1)
Sodium: 137 mEq/L (ref 135–145)

## 2013-04-14 LAB — MRSA PCR SCREENING: MRSA by PCR: NEGATIVE

## 2013-04-14 LAB — GLUCOSE, CAPILLARY: Glucose-Capillary: 154 mg/dL — ABNORMAL HIGH (ref 70–99)

## 2013-04-14 LAB — TROPONIN I: Troponin I: <0.3 ng/mL (ref <0.30)

## 2013-04-14 MED ORDER — SODIUM CHLORIDE 0.9 % IV SOLN: 250.0000 mL | INTRAVENOUS | Status: DC | PRN

## 2013-04-14 MED ORDER — SODIUM CHLORIDE 0.9 % IJ SOLN: 3.0000 mL | INTRAMUSCULAR | Status: DC | PRN

## 2013-04-14 MED ORDER — ENOXAPARIN SODIUM 40 MG/0.4ML ~~LOC~~ SOLN
40.0000 mg | SUBCUTANEOUS | Status: DC
  Filled 2013-04-14: qty 0.4

## 2013-04-14 MED ORDER — SODIUM CHLORIDE 0.9 % IJ SOLN
3.0000 mL | Freq: Two times a day (BID) | INTRAMUSCULAR | Status: DC
  Administered 2013-04-14: 3 mL via INTRAVENOUS

## 2013-04-14 MED ORDER — ASPIRIN 81 MG PO CHEW: 324.0000 mg | CHEWABLE_TABLET | Freq: Once | ORAL | Status: DC

## 2013-04-14 MED ORDER — ALPRAZOLAM 0.25 MG PO TABS: 0.2500 mg | ORAL_TABLET | Freq: Two times a day (BID) | ORAL | Status: DC | PRN

## 2013-04-14 MED ORDER — FAMOTIDINE 20 MG PO TABS
20.0000 mg | ORAL_TABLET | Freq: Once | ORAL | Status: AC
  Administered 2013-04-14: 20 mg via ORAL
  Filled 2013-04-14: qty 1

## 2013-04-14 MED ORDER — INSULIN ASPART 100 UNIT/ML ~~LOC~~ SOLN: 0.0000 [IU] | Freq: Three times a day (TID) | SUBCUTANEOUS | Status: DC

## 2013-04-14 MED ORDER — ASPIRIN EC 81 MG PO TBEC
81.0000 mg | DELAYED_RELEASE_TABLET | Freq: Every day | ORAL | Status: DC
  Filled 2013-04-14: qty 1

## 2013-04-14 MED ORDER — AMITRIPTYLINE HCL 50 MG PO TABS
50.0000 mg | ORAL_TABLET | Freq: Every day | ORAL | Status: DC
  Administered 2013-04-14: 23:00:00 50 mg via ORAL
  Filled 2013-04-14 (×2): qty 1

## 2013-04-14 MED ORDER — NITROGLYCERIN 0.4 MG SL SUBL: 0.4000 mg | SUBLINGUAL_TABLET | SUBLINGUAL | Status: DC | PRN

## 2013-04-14 MED ORDER — TOPIRAMATE 25 MG PO TABS
50.0000 mg | ORAL_TABLET | Freq: Every day | ORAL | Status: DC
  Administered 2013-04-14: 50 mg via ORAL
  Filled 2013-04-14 (×2): qty 2

## 2013-04-14 MED ORDER — GI COCKTAIL ~~LOC~~
30.0000 mL | Freq: Once | ORAL | Status: AC
  Administered 2013-04-14: 30 mL via ORAL
  Filled 2013-04-14: qty 30

## 2013-04-14 MED ORDER — PANTOPRAZOLE SODIUM 40 MG PO TBEC
40.0000 mg | DELAYED_RELEASE_TABLET | Freq: Every day | ORAL | Status: DC
  Administered 2013-04-14 – 2013-04-15 (×2): 40 mg via ORAL
  Filled 2013-04-14 (×2): qty 1

## 2013-04-14 MED ORDER — MORPHINE SULFATE 2 MG/ML IJ SOLN
2.0000 mg | INTRAMUSCULAR | Status: DC | PRN
  Administered 2013-04-14: 2 mg via INTRAVENOUS
  Filled 2013-04-14: qty 1

## 2013-04-14 MED ORDER — ONDANSETRON HCL 4 MG/2ML IJ SOLN: 4.0000 mg | Freq: Four times a day (QID) | INTRAMUSCULAR | Status: DC | PRN

## 2013-04-14 MED ORDER — SODIUM CHLORIDE 0.9 % IV SOLN
INTRAVENOUS | Status: DC
  Administered 2013-04-15: 07:00:00 via INTRAVENOUS

## 2013-04-14 MED ORDER — ZOLPIDEM TARTRATE 5 MG PO TABS: 5.0000 mg | ORAL_TABLET | Freq: Every evening | ORAL | Status: DC | PRN

## 2013-04-14 MED ORDER — ATORVASTATIN CALCIUM 10 MG PO TABS
10.0000 mg | ORAL_TABLET | Freq: Every day | ORAL | Status: DC
  Filled 2013-04-14: qty 1

## 2013-04-14 NOTE — ED Notes (Signed)
Per EMS pt came from work c/o CP x1 week that has gotten worse today. Describes pain as stabbing central CP that radiates to right arm. EMS gave 324 ASA and 3 SL nitroglycerin that decreased pain from 8/10 to 6/10. VSS.

## 2013-04-14 NOTE — ED Provider Notes (Signed)
CSN: [id number]     Arrival date & time 04/14/13  1337 History   First MD Initiated Contact with Patient 04/14/13 1352     Chief Complaint  Patient presents with  . Chest Pain   (Consider location/radiation/quality/duration/timing/severity/associated sxs/prior Treatment) Patient is a 47 y.o. female presenting with chest pain. The history is provided by the patient.  Chest Pain Associated symptoms: no abdominal pain, no back pain, no fever, no headache, no shortness of breath and not vomiting   pt c/o intermittent chest pain/pressure in past week, states constant, but intensity of pain waxes and wanes. States seems worse w stress/work. Not pleuritic. Mild nausea. No diaphoresis or sob. No unusual doe or fatigue. Denies prior cardiac evaluation. States family hx cad in 73's, with 3 siblings and parent having 'heart attack' or cardiac arrest in their 59's. Hx htn, diabetes. Non smoker. No leg pain or swelling. No hx dvt or pe. No recent cough or uri symptoms. No recent febrile or viral illness. Hx gerd.      Past Medical History  Diagnosis Date  . Asthma   . Migraine   . Hypertension   . Obesity   . GERD (gastroesophageal reflux disease)   . Diabetes 02/14/2013   Past Surgical History  Procedure Laterality Date  . Tubal ligation  1991  . Cholecystectomy  1990  . Rotator cuff repair  2006, 2007  . Umbilical hernia repair      [id number]   Family History  Problem Relation Age of Onset  . Colon cancer Neg Hx   . Breast cancer       GM  . Diabetes Father     F, B, S  . Pancreatic cancer      GM  . CAD Sister     MI at age 41  . CAD Brother     CHF, diet at age 60   History  Substance Use Topics  . Smoking status: Never Smoker   . Smokeless tobacco: Never Used  . Alcohol Use: No   OB History   Grav Para Term Preterm Abortions TAB SAB Ect Mult Living                 Review of Systems  Constitutional: Negative for fever and chills.  HENT: Negative for neck pain.    Eyes: Negative for redness.  Respiratory: Negative for shortness of breath.   Cardiovascular: Positive for chest pain.  Gastrointestinal: Negative for vomiting and abdominal pain.  Genitourinary: Negative for flank pain.  Musculoskeletal: Negative for back pain.  Skin: Negative for rash.  Neurological: Negative for headaches.  Hematological: Does not bruise/bleed easily.  Psychiatric/Behavioral: Negative for confusion.    Allergies  Vicodin  Home Medications   Current Outpatient Rx  Name  Route  Sig  Dispense  Refill  . amitriptyline (ELAVIL) 50 MG tablet   Oral   Take 1 tablet (50 mg total) by mouth at bedtime.   15 tablet   0   . EXPIRED: esomeprazole (NEXIUM) 40 MG capsule   Oral   Take 40 mg by mouth daily.         . fexofenadine (ALLEGRA) 180 MG tablet   Oral   Take 180 mg by mouth daily.         . metFORMIN (GLUCOPHAGE) 500 MG tablet      Take 1 tab daily for 1 week then increase to 1 tab twice a day   60 tablet   1   .  topiramate (TOPAMAX) 25 MG tablet   Oral   Take 50 mg by mouth at bedtime.          . traMADol (ULTRAM) 50 MG tablet   Oral   Take 1 tablet (50 mg total) by mouth every 6 (six) hours as needed for pain.   15 tablet   0    BP 131/76  Pulse 85  Temp(Src) 98.7 F (37.1 C) (Oral)  Resp 18  SpO2 99%  LMP 04/10/2013 Physical Exam  Nursing note and vitals reviewed. Constitutional: She appears well-developed and well-nourished. No distress.  Eyes: Conjunctivae are normal. No scleral icterus.  Neck: Neck supple. No JVD present. No tracheal deviation present.  Cardiovascular: Normal rate, regular rhythm, normal heart sounds and intact distal pulses.  Exam reveals no gallop and no friction rub.   Pulmonary/Chest: Effort normal and breath sounds normal. No respiratory distress. She exhibits no tenderness.  Abdominal: Soft. Normal appearance. She exhibits no distension. There is no tenderness.  Obese   Musculoskeletal: She exhibits  no edema and no tenderness.  Neurological: She is alert.  Skin: Skin is warm and dry. No rash noted.  Psychiatric: She has a normal mood and affect.    ED Course  Procedures (including critical care time)   Results for orders placed during the hospital encounter of 04/14/13  CBC      Result Value Range   WBC 8.5  4.0 - 10.5 K/uL   RBC 4.28  3.87 - 5.11 MIL/uL   Hemoglobin 11.9 (*) 12.0 - 15.0 g/dL   HCT 47.8 (*) 29.5 - 62.1 %   MCV 80.4  78.0 - 100.0 fL   MCH 27.8  26.0 - 34.0 pg   MCHC 34.6  30.0 - 36.0 g/dL   RDW 30.8  65.7 - 84.6 %   Platelets 324  150 - 400 K/uL  POCT I-STAT TROPONIN I      Result Value Range   Troponin i, poc 0.00  0.00 - 0.08 ng/mL   Comment 3            Dg Chest 2 View  04/14/2013   CLINICAL DATA:  Chest pain.  EXAM: CHEST  2 VIEW  COMPARISON:  PA and lateral chest 10/23/2012.  FINDINGS: Heart size and mediastinal contours are within normal limits. Both lungs are clear. Visualized skeletal structures are unremarkable.  IMPRESSION: No active cardiopulmonary disease.   Electronically Signed   By: Drusilla Kanner M.D.   On: 04/14/2013 14:58      MDM  Iv ns. Labs. Asa. Pcxr. Ecg.   Date: 04/14/2013  Rate: 85  Rhythm: normal sinus rhythm  QRS Axis: normal  Intervals: normal  ST/T Wave abnormalities: normal  Conduction Disutrbances:none  Narrative Interpretation:   Old EKG Reviewed: unchanged  Reviewed nursing notes and prior charts for additional history.   pts trop and ecg unremarkable, although v strong family hx heart disease at early age.  Pt is [place] pt - will consult Leb card.   Recheck pt comfortable.     Suzi Roots, MD 04/14/13 [phone]

## 2013-04-14 NOTE — ED Notes (Signed)
Meal tray ordered for patient.

## 2013-04-14 NOTE — ED Notes (Signed)
Cardiology at bedside.

## 2013-04-14 NOTE — Progress Notes (Signed)
Arrived to 6C01 per stretcher.  States CP gone, denies other complaints, in no apparent distress.  SR 80's  BP 148/87.  Oriented to unit.  Orders noted to prep for cath and lovenox.  Dr Terressa Koyanagi on call, discussed plan, advised to hold lovenox and will order appropriate dose of heparin after next troponin results.  Call light in reach.

## 2013-04-14 NOTE — H&P (Signed)
Patient ID: [name] MRN: [id number], DOB/AGE: 1966/02/08   Admit date: 04/14/2013  Primary Physician: Willow Ora, MD Primary Cardiologist: None  Pt. Profile:  [name] is a 47 year old female with DMII and a very strong family history of heart disease who presented to ED today complaining of CP.   Problem List  Past Medical History  Diagnosis Date  . Asthma   . Migraine   . Hypertension   . Obesity   . GERD (gastroesophageal reflux disease)   . Diabetes 02/14/2013    Past Surgical History  Procedure Laterality Date  . Tubal ligation  1991  . Cholecystectomy  1990  . Rotator cuff repair  2006, 2007  . Umbilical hernia repair      [id number]    Allergies  Allergies  Allergen Reactions  . Vicodin [Hydrocodone-Acetaminophen] Swelling and Rash   HPI  [name] is a 47 year old female with DMII and a very strong family history of heart disease who presented to ED today complaining of CP. About 1 week ago she started experiencing daily episodes of SSCP with emotional stress at work. When a stressful situation would come up she would begin to experience a dull ache in the center of her chest. As the episode went on it would become a sharp, stabbing pain. Every time she has had this CP she has also had assoc radiation down her right arm with numbness and tingling in her right hand. These episodes typically resolve within a few minutes. Yesterday at work she received some stressful news and awoke with the same CP this morning. The CP continued on today longer than it normally lasts and was also more severe, rated 8/10, which prompted her to present to the ED. She did not have any assoc SOB, palps, orthopnea, or PND. She is fairly active and walks for exercise. She has never experienced any of these CP episodes with exertion, she only gets them with emotional stress. She has a very strong family history of heart disease with sister dying of MI at 64, brother dying of CAD at 35, a  2nd brother dying of CAD at 39, and her father dying of CAD at age 91. She had a negative stress test in 2003 but has had no ischemic work up since then.   In the ED her CP decreased from 8/10 --> 5/10 after receiving SL NTG in the ambulance. 1st trop 0.00, 2nd draw not done yet. EKG showed NSR with no ST/T wave changes suggestive of ischemia.   Home Medications  Prior to Admission medications   Medication Sig Start Date End Date Taking? Authorizing Provider  esomeprazole (NEXIUM) 40 MG capsule Take 40 mg by mouth daily. 10/27/11 04/14/13 Yes Dawn Vidal Schwalbe, PA-C  fexofenadine (ALLEGRA) 180 MG tablet Take 180 mg by mouth daily.   Yes Historical Provider, MD  metFORMIN (GLUCOPHAGE) 500 MG tablet Take 500 mg by mouth 2 (two) times daily with a meal.   Yes Historical Provider, MD  topiramate (TOPAMAX) 25 MG tablet Take 50 mg by mouth at bedtime.    Yes Historical Provider, MD  amitriptyline (ELAVIL) 50 MG tablet Take 1 tablet (50 mg total) by mouth at bedtime. 03/14/13   Linna Hoff, MD   Family History  Family History  Problem Relation Age of Onset  . Colon cancer Neg Hx   . Breast cancer       GM  . Diabetes Father  F, B, S  . Pancreatic cancer      GM  . CAD Sister     MI at age 55  . CAD Brother     CHF, died at age 22  . Heart attack Brother     died suddenly @ 48  . CAD Father     died of MI @ 58  . Kidney disease Father    Social History  History   Social History  . Marital Status: Single    Spouse Name: N/A    Number of Children: 2  . Years of Education: N/A   Occupational History  . call center     Social History Main Topics  . Smoking status: Never Smoker   . Smokeless tobacco: Never Used  . Alcohol Use: No  . Drug Use: No  . Sexual Activity: Yes    Birth Control/ Protection: None   Other Topics Concern  . Not on file   Social History Narrative   Lives in [place] with her grown son.  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  Does not routinely exercise.     Review of Systems General:  No chills, fever, night sweats or weight changes.  Cardiovascular:  +++ chest pain, no dyspnea on exertion, edema, orthopnea, palpitations, paroxysmal nocturnal dyspnea. Dermatological: No rash, lesions/masses Respiratory: No cough, dyspnea Urologic: No hematuria, dysuria Abdominal:   No nausea, vomiting, diarrhea, bright red blood per rectum, melena, or hematemesis Neurologic:  No visual changes, wkns, changes in mental status. All other systems reviewed and are otherwise negative except as noted above.  Physical Exam  Blood pressure 134/87, pulse 77, temperature 98.7 F (37.1 C), temperature source Oral, resp. rate 12, last menstrual period 04/10/2013, SpO2 100.00%.  General: Pleasant, NAD Psych: Normal affect. Neuro: Alert and oriented X 3. Moves all extremities spontaneously. Neck: Supple w/o bruits.  Difficult to assess JVP 2/2 Lungs:  Resp regular and unlabored, CTA. Heart: RRR no s3, s4, or murmurs. Abdomen: Soft, non-tender, non-distended, BS + x 4.  Extremities: No edema. DP/PT/Radials 2+ and equal bilaterally.  Labs  Trop i, poc: 0.00  Lab Results  Component Value Date   WBC 8.5 04/14/2013   HGB 11.9* 04/14/2013   HCT 34.4* 04/14/2013   MCV 80.4 04/14/2013   PLT 324 04/14/2013     Recent Labs Lab 04/14/13 1511  NA 137  K 3.6  CL 103  CO2 23  BUN 8  CREATININE 0.66  CALCIUM 9.0  GLUCOSE 112*   Lab Results  Component Value Date   CHOL 168 12/13/2012   HDL 40.20 12/13/2012   LDLCALC 110* 12/13/2012   TRIG 88.0 12/13/2012   Radiology/Studies  Dg Chest 2 View  04/14/2013   CLINICAL DATA:  Chest pain.  EXAM: CHEST  2 VIEW  COMPARISON:  PA and lateral chest 10/23/2012.  FINDINGS: Heart size and mediastinal contours are within normal limits. Both lungs are clear. Visualized skeletal structures are unremarkable.  IMPRESSION: No active cardiopulmonary disease.   Electronically Signed   By: Drusilla Kanner M.D.   On: 04/14/2013 14:58   ECG  NSR with HR 85 bpm, no ST/T wave changes.   ASSESSMENT AND PLAN  [name] is a 47 year old female with DMII and strong family history of CAD who presents to ED with atypical CP.  1. CP - Her CP has been assoc with emotional stress at work over the past week. The  stress at her job has recently increased. She has been able to be active with walking the past week (with a cane due to recent knee injury) without any assoc CP. Her CP episodes typically resolve on their own after a few minutes. She has been having atypical CP that does not sound to be cardiac in origin. Her initial troponin is WNL and her EKG shows no signs of ischemia.  Given FH and h/o DM, will plan to pursue diagnostic cath in the AM.  Add asa, check lipids/lft's.  Add heparin if CE return elevated.  2. DMII - BG levels well controlled on Metformin 500MG  once daily. Last A1C 6.3% in 6/14.  Hold metformin.  Add SSI.  3.  Morbid Obesity:  Would benefit from nutritional counseling.  Signed, Nicolasa Ducking, NP  04/14/2013, 5:57 PM Seen in the emergency room.  Agree with assessment and plan as noted above.  Her family history is very striking for premature coronary artery disease including her sister who died at age 49 of sudden cardiac death.  The patient is 47.  Noninvasive imaging would be difficult and possibly inconclusive because of her body habitus.  Would favor proceeding with cardiac catheterization in a.m. physical examination is unrevealing except for morbid obesity.  Pedal pulses are satisfactory.

## 2013-04-15 ENCOUNTER — Encounter (HOSPITAL_COMMUNITY)
Admission: EM | Disposition: A | Discharge status: Home / Self Care | Payer: Self-pay | Source: Home / Self Care | Attending: Emergency Medicine

## 2013-04-15 DIAGNOSIS — R0789 Other chest pain: Secondary | ICD-10-CM

## 2013-04-15 DIAGNOSIS — R079 Chest pain, unspecified: Secondary | ICD-10-CM

## 2013-04-15 HISTORY — PX: LEFT HEART CATHETERIZATION WITH CORONARY ANGIOGRAM: SHX5451

## 2013-04-15 LAB — LIPID PANEL
Cholesterol: 171 mg/dL (ref 0–200)
HDL: 39 mg/dL — ABNORMAL LOW (ref >39)
LDL Cholesterol: 108 mg/dL — ABNORMAL HIGH (ref 0–99)
Total CHOL/HDL Ratio: 4.4 RATIO
Triglycerides: 120 mg/dL (ref <150)
VLDL: 24 mg/dL (ref 0–40)

## 2013-04-15 LAB — COMPREHENSIVE METABOLIC PANEL
ALT: 8 U/L (ref 0–35)
AST: 11 U/L (ref 0–37)
Albumin: 2.9 g/dL — ABNORMAL LOW (ref 3.5–5.2)
Alkaline Phosphatase: 64 U/L (ref 39–117)
BUN: 9 mg/dL (ref 6–23)
CO2: 22 mEq/L (ref 19–32)
Calcium: 8.6 mg/dL (ref 8.4–10.5)
Chloride: 104 mEq/L (ref 96–112)
Creatinine, Ser: 0.69 mg/dL (ref 0.50–1.10)
GFR calc Af Amer: >90 mL/min (ref >90)
GFR calc non Af Amer: >90 mL/min (ref >90)
Glucose, Bld: 126 mg/dL — ABNORMAL HIGH (ref 70–99)
Potassium: 3.6 mEq/L (ref 3.5–5.1)
Sodium: 137 mEq/L (ref 135–145)
Total Bilirubin: 0.2 mg/dL — ABNORMAL LOW (ref 0.3–1.2)
Total Protein: 6.7 g/dL (ref 6.0–8.3)

## 2013-04-15 LAB — PROTIME-INR
INR: 1.09 (ref 0.00–1.49)
Prothrombin Time: 13.9 seconds (ref 11.6–15.2)

## 2013-04-15 LAB — TROPONIN I
Troponin I: <0.3 ng/mL (ref <0.30)
Troponin I: <0.3 ng/mL (ref <0.30)

## 2013-04-15 LAB — TSH: TSH: 1.938 u[IU]/mL (ref 0.350–4.500)

## 2013-04-15 LAB — GLUCOSE, CAPILLARY
Glucose-Capillary: 107 mg/dL — ABNORMAL HIGH (ref 70–99)
Glucose-Capillary: 103 mg/dL — ABNORMAL HIGH (ref 70–99)

## 2013-04-15 SURGERY — LEFT HEART CATHETERIZATION WITH CORONARY ANGIOGRAM
Anesthesia: LOCAL

## 2013-04-15 MED ORDER — LIDOCAINE HCL (PF) 1 % IJ SOLN
INTRAMUSCULAR | Status: AC
  Filled 2013-04-15: qty 30

## 2013-04-15 MED ORDER — HEPARIN SODIUM (PORCINE) 1000 UNIT/ML IJ SOLN
INTRAMUSCULAR | Status: AC
  Filled 2013-04-15: qty 1

## 2013-04-15 MED ORDER — SODIUM CHLORIDE 0.9 % IV SOLN
1.0000 mL/kg/h | INTRAVENOUS | Status: AC
  Administered 2013-04-15: 14:00:00 1 mL/kg/h via INTRAVENOUS

## 2013-04-15 MED ORDER — FENTANYL CITRATE 0.05 MG/ML IJ SOLN
INTRAMUSCULAR | Status: AC
  Filled 2013-04-15: qty 2

## 2013-04-15 MED ORDER — METFORMIN HCL 500 MG PO TABS: 500.0000 mg | ORAL_TABLET | Freq: Two times a day (BID) | ORAL | Status: DC

## 2013-04-15 MED ORDER — VERAPAMIL HCL 2.5 MG/ML IV SOLN
INTRAVENOUS | Status: AC
  Filled 2013-04-15: qty 2

## 2013-04-15 MED ORDER — HEPARIN (PORCINE) IN NACL 2-0.9 UNIT/ML-% IJ SOLN
INTRAMUSCULAR | Status: AC
  Filled 2013-04-15: qty 1000

## 2013-04-15 MED ORDER — NITROGLYCERIN 0.2 MG/ML ON CALL CATH LAB
INTRAVENOUS | Status: AC
  Filled 2013-04-15: qty 1

## 2013-04-15 MED ORDER — HEPARIN SODIUM (PORCINE) 5000 UNIT/ML IJ SOLN
5000.0000 [IU] | Freq: Three times a day (TID) | INTRAMUSCULAR | Status: DC
  Administered 2013-04-15: 5000 [IU] via SUBCUTANEOUS
  Filled 2013-04-15 (×5): qty 1

## 2013-04-15 MED ORDER — MIDAZOLAM HCL 2 MG/2ML IJ SOLN
INTRAMUSCULAR | Status: AC
  Filled 2013-04-15: qty 2

## 2013-04-15 NOTE — H&P (View-Only) (Signed)
Subjective:  No further chest pain. EKG today shows no acute changes.  Objective:  Vital Signs in the last 24 hours: Temp:  [97.4 F (36.3 C)-98.7 F (37.1 C)] 97.4 F (36.3 C) (10/07 0438) Pulse Rate:  [72-88] 72 (10/07 0438) Resp:  [12-20] 18 (10/07 0438) BP: (118-151)/(76-98) 118/78 mmHg (10/07 0438) SpO2:  [96 %-100 %] 100 % (10/07 0438) Weight:  [307 lb 15.7 oz (139.7 kg)-309 lb 11.9 oz (140.5 kg)] 307 lb 15.7 oz (139.7 kg) (10/07 0438)  Intake/Output from previous day: 10/06 0701 - 10/07 0700 In: -  Out: 1100 [Urine:1100] Intake/Output from this shift:    . amitriptyline  50 mg Oral QHS  . aspirin EC  81 mg Oral Daily  . atorvastatin  10 mg Oral q1800  . heparin  5,000 Units Subcutaneous Q8H  . insulin aspart  0-15 Units Subcutaneous TID WC  . pantoprazole  40 mg Oral Daily  . topiramate  50 mg Oral QHS   . sodium chloride 125 mL/hr at 04/15/13 1610    Physical Exam: The patient appears to be in no distress.  Head and neck exam reveals that the pupils are equal and reactive.  The extraocular movements are full.  There is no scleral icterus.  Mouth and pharynx are benign.  No lymphadenopathy.  No carotid bruits.  The jugular venous pressure is normal.  Thyroid is not enlarged or tender.  Chest is clear to percussion and auscultation.  No rales or rhonchi.  Expansion of the chest is symmetrical.  Heart reveals no abnormal lift or heave.  First and second heart sounds are normal.  There is no murmur gallop rub or click.  The abdomen is soft and nontender.  Bowel sounds are normoactive.  There is no hepatosplenomegaly or mass.  There are no abdominal bruits.  Extremities reveal no phlebitis or edema.  Pedal pulses are good.  There is no cyanosis or clubbing.  Neurologic exam is normal strength and no lateralizing weakness.  No sensory deficits.  Integument reveals no rash  Lab Results:  Recent Labs  04/14/13 1511  WBC 8.5  HGB 11.9*  PLT 324     Recent Labs  04/14/13 1511 04/15/13 0300  NA 137 137  K 3.6 3.6  CL 103 104  CO2 23 22  GLUCOSE 112* 126*  BUN 8 9  CREATININE 0.66 0.69    Recent Labs  04/14/13 2117 04/15/13 0300  TROPONINI <0.30 <0.30   Hepatic Function Panel  Recent Labs  04/15/13 0300  PROT 6.7  ALBUMIN 2.9*  AST 11  ALT 8  ALKPHOS 64  BILITOT 0.2*    Recent Labs  04/15/13 0300  CHOL 171   No results found for this basename: PROTIME,  in the last 72 hours  Imaging: Dg Chest 2 View  04/14/2013   CLINICAL DATA:  Chest pain.  EXAM: CHEST  2 VIEW  COMPARISON:  PA and lateral chest 10/23/2012.  FINDINGS: Heart size and mediastinal contours are within normal limits. Both lungs are clear. Visualized skeletal structures are unremarkable.  IMPRESSION: No active cardiopulmonary disease.   Electronically Signed   By: Drusilla Kanner M.D.   On: 04/14/2013 14:58    Cardiac Studies: Telemetry shows NSR Assessment/Plan:  1. Chest pain. No evidence of MI. For left heart cath today. 2. Diabetes mellitus 3. Morbid obesity. Will ask dietition to give nutritional counseling.  Plan: possibly home late today depending on cath results.  LOS: 1 day  Cassell Clement 04/15/2013, 7:45 AM

## 2013-04-15 NOTE — CV Procedure (Signed)
    Cardiac Catheterization Procedure Note  Name: [name] MRN: [id number] DOB: 1965-10-04  Procedure: Left Heart Cath, Selective Coronary Angiography, LV angiography  Indication: 47 yo BF with DM and very strong family history of CAD presents with symptoms of chest pain.   Procedural Details: The right wrist was prepped, draped, and anesthetized with 1% lidocaine. Using the modified Seldinger technique, a 5 French sheath was introduced into the right radial artery. 3 mg of verapamil was administered through the sheath, weight-based unfractionated heparin was administered intravenously. Standard Judkins catheters were used for selective coronary angiography and left ventriculography. Catheter exchanges were performed over an exchange length guidewire. There were no immediate procedural complications. A TR band was used for radial hemostasis at the completion of the procedure.  The patient was transferred to the post catheterization recovery area for further monitoring.  Procedural Findings: Hemodynamics: AO 130/60  LV 130/17  Coronary angiography: Coronary dominance: right  Left mainstem: Normal  Left anterior descending (LAD): Normal  Left circumflex (LCx): Normal  Right coronary artery (RCA): Normal.  Left ventriculography: Left ventricular systolic function is normal, LVEF is estimated at 55-65%, there is no significant mitral regurgitation   Final Conclusions:   1. Normal coronary anatomy. 2. Normal LV function.  Recommendations: Will DC home today. Follow up with primary care.  Theron Arista [facility] 04/15/2013, 12:27 PM

## 2013-04-15 NOTE — Plan of Care (Signed)
Problem: Food- and Nutrition-Related Knowledge Deficit (NB-1.1) Goal: Nutrition education Formal process to instruct or train a patient/client in a skill or to impart knowledge to help patients/clients voluntarily manage or modify food choices and eating behavior to maintain or improve health. Outcome: Completed/Met Date Met:  04/15/13  RD consulted for nutrition education regarding weight loss.  Body mass index is 48.23 kg/(m^2). Pt meets criteria for obesity class 3, extreme based on current BMI.  RD provided "1800 kcal meal plan" handout from the Academy of [employer]etics. Emphasized the importance of serving sizes and provided examples of correct portions of common foods. Discussed importance of controlled and consistent intake throughout the day. Provided examples of ways to balance meals/snacks and encouraged intake of high-fiber, whole grain complex carbohydrates. Emphasized the importance of hydration with calorie-free beverages and limiting sugar-sweetened beverages. Encouraged pt to discuss physical activity options with physician. Teach back method used.  Expect poor compliance.   Labs and medications reviewed. No further nutrition interventions warranted at this time. RD contact information provided. If additional nutrition issues arise, please re-consult RD.  Isabell Jarvis RD, LDN Pager [phone] After Hours pager [phone]

## 2013-04-15 NOTE — Interval H&P Note (Signed)
History and Physical Interval Note:  04/15/2013 11:36 AM  [name]  has presented today for surgery, with the diagnosis of cp  The various methods of treatment have been discussed with the patient and family. After consideration of risks, benefits and other options for treatment, the patient has consented to  Procedure(s): LEFT HEART CATHETERIZATION WITH CORONARY ANGIOGRAM (N/A) as a surgical intervention .  The patient's history has been reviewed, patient examined, no change in status, stable for surgery.  I have reviewed the patient's chart and labs.  Questions were answered to the patient's satisfaction.   Cath Lab Visit (complete for each Cath Lab visit)  Clinical Evaluation Leading to the Procedure:   ACS: yes  Non-ACS:    Anginal Classification: CCS III  Anti-ischemic medical therapy: No Therapy  Non-Invasive Test Results: No non-invasive testing performed  Prior CABG: No previous CABG        Theron Arista [facility] 04/15/2013 11:36 AM

## 2013-04-15 NOTE — Discharge Summary (Signed)
Patient ID: [name],  MRN: [id number], DOB/AGE: Apr 01, 1966 47 y.o.  Admit date: 04/14/2013 Discharge date: 04/15/2013  Primary Care Provider: Willow Ora Primary Cardiologist: Wylene Simmer, MD   Discharge Diagnoses Principal Problem:   Midsternal chest pain  **s/p catheterization this admission revealing normal coronary arteries and normal LV function.  Active Problems:   Morbid obesity   HTN (hypertension)   Diabetes   GERD (gastroesophageal reflux disease)   Migraines  Allergies Allergies  Allergen Reactions  . Vicodin [Hydrocodone-Acetaminophen] Swelling and Rash   Procedures  Cardiac Catheterization 10.7.2014  Procedural Findings: Hemodynamics: AO 130/60        LV 130/17  Coronary angiography: Coronary dominance: right  Left mainstem: Normal Left anterior descending (LAD): Normal Left circumflex (LCx): Normal Right coronary artery (RCA): Normal. Left Ventriculography: Left ventricular systolic function is normal, LVEF is estimated at 55-65%, there is no significant mitral regurgitation   Final Conclusions:   1. Normal coronary anatomy. 2. Normal LV function. _____________   History of Present Illness  47 year old female with prior family history of coronary artery disease occurring at or early age. She has no personal history of coronary artery disease. She was in her usual state of health until the week of admission when she began to experience intermittent sharp and shooting chest pain across her chest and down her arms occurring almost exclusively with emotional upset. She had multiple episodes in the day of admission prompting her to present to the [facility]. There, ECG was nonacute and initial troponin was negative. She was admitted for further evaluation.  Hospital Course  Patient ruled out for myocardial infarction. Given risk factors and concern for coronary artery disease, decision was made to pursue diagnostic catheterization. Catheterization was  performed this morning showing normal coronary arteries and normal LV function. Post catheterization, patient is been doing without difficulty and will be discharged home today in good condition. We have recommended that she followup with primary care provider.  Discharge Vitals Blood pressure 138/95, pulse 79, temperature 98 F (36.7 C), temperature source Oral, resp. rate 18, height 5\' 7"  (1.702 m), weight 307 lb 15.7 oz (139.7 kg), last menstrual period 04/10/2013, SpO2 100.00%.  Filed Weights   04/14/13 2037 04/15/13 0438  Weight: 309 lb 11.9 oz (140.5 kg) 307 lb 15.7 oz (139.7 kg)   Labs  CBC  Recent Labs  04/14/13 1511  WBC 8.5  HGB 11.9*  HCT 34.4*  MCV 80.4  PLT 324   Basic Metabolic Panel  Recent Labs  04/14/13 1511 04/15/13 0300  NA 137 137  K 3.6 3.6  CL 103 104  CO2 23 22  GLUCOSE 112* 126*  BUN 8 9  CREATININE 0.66 0.69  CALCIUM 9.0 8.6   Liver Function Tests  Recent Labs  04/15/13 0300  AST 11  ALT 8  ALKPHOS 64  BILITOT 0.2*  PROT 6.7  ALBUMIN 2.9*   Cardiac Enzymes  Recent Labs  04/14/13 2117 04/15/13 0300 04/15/13 1050  TROPONINI <0.30 <0.30 <0.30   Fasting Lipid Panel  Recent Labs  04/15/13 0300  CHOL 171  HDL 39*  LDLCALC 108*  TRIG 120  CHOLHDL 4.4   Thyroid Function Tests  Recent Labs  04/14/13 2117  TSH 1.938   Disposition  Pt is being discharged home today in good condition.  Follow-up Plans & Appointments  Follow-up Information   Follow up with Willow Ora, MD. (1-2 wks)    Specialty:  Internal Medicine   Contact information:  [address] [facility] [address] [place] [address] [phone]      Discharge Medications    Medication List         amitriptyline 50 MG tablet  Commonly known as:  ELAVIL  Take 1 tablet (50 mg total) by mouth at bedtime.     esomeprazole 40 MG capsule  Commonly known as:  NEXIUM  Take 40 mg by mouth daily.     fexofenadine 180 MG tablet  Commonly known  as:  ALLEGRA  Take 180 mg by mouth daily.     metFORMIN 500 MG tablet  Commonly known as:  GLUCOPHAGE  Take 1 tablet (500 mg total) by mouth 2 (two) times daily with a meal.     topiramate 25 MG tablet  Commonly known as:  TOPAMAX  Take 50 mg by mouth at bedtime.       Outstanding Labs/Studies  None  Duration of Discharge Encounter   Greater than 30 minutes including physician time.  Signed, Nicolasa Ducking NP 04/15/2013, 2:37 PM

## 2013-04-15 NOTE — Progress Notes (Signed)
   Subjective:  No further chest pain. EKG today shows no acute changes.  Objective:  Vital Signs in the last 24 hours: Temp:  [97.4 F (36.3 C)-98.7 F (37.1 C)] 97.4 F (36.3 C) (10/07 0438) Pulse Rate:  [72-88] 72 (10/07 0438) Resp:  [12-20] 18 (10/07 0438) BP: (118-151)/(76-98) 118/78 mmHg (10/07 0438) SpO2:  [96 %-100 %] 100 % (10/07 0438) Weight:  [307 lb 15.7 oz (139.7 kg)-309 lb 11.9 oz (140.5 kg)] 307 lb 15.7 oz (139.7 kg) (10/07 0438)  Intake/Output from previous day: 10/06 0701 - 10/07 0700 In: -  Out: 1100 [Urine:1100] Intake/Output from this shift:    . amitriptyline  50 mg Oral QHS  . aspirin EC  81 mg Oral Daily  . atorvastatin  10 mg Oral q1800  . heparin  5,000 Units Subcutaneous Q8H  . insulin aspart  0-15 Units Subcutaneous TID WC  . pantoprazole  40 mg Oral Daily  . topiramate  50 mg Oral QHS   . sodium chloride 125 mL/hr at 04/15/13 0657    Physical Exam: The patient appears to be in no distress.  Head and neck exam reveals that the pupils are equal and reactive.  The extraocular movements are full.  There is no scleral icterus.  Mouth and pharynx are benign.  No lymphadenopathy.  No carotid bruits.  The jugular venous pressure is normal.  Thyroid is not enlarged or tender.  Chest is clear to percussion and auscultation.  No rales or rhonchi.  Expansion of the chest is symmetrical.  Heart reveals no abnormal lift or heave.  First and second heart sounds are normal.  There is no murmur gallop rub or click.  The abdomen is soft and nontender.  Bowel sounds are normoactive.  There is no hepatosplenomegaly or mass.  There are no abdominal bruits.  Extremities reveal no phlebitis or edema.  Pedal pulses are good.  There is no cyanosis or clubbing.  Neurologic exam is normal strength and no lateralizing weakness.  No sensory deficits.  Integument reveals no rash  Lab Results:  Recent Labs  04/14/13 1511  WBC 8.5  HGB 11.9*  PLT 324     Recent Labs  04/14/13 1511 04/15/13 0300  NA 137 137  K 3.6 3.6  CL 103 104  CO2 23 22  GLUCOSE 112* 126*  BUN 8 9  CREATININE 0.66 0.69    Recent Labs  04/14/13 2117 04/15/13 0300  TROPONINI <0.30 <0.30   Hepatic Function Panel  Recent Labs  04/15/13 0300  PROT 6.7  ALBUMIN 2.9*  AST 11  ALT 8  ALKPHOS 64  BILITOT 0.2*    Recent Labs  04/15/13 0300  CHOL 171   No results found for this basename: PROTIME,  in the last 72 hours  Imaging: Dg Chest 2 View  04/14/2013   CLINICAL DATA:  Chest pain.  EXAM: CHEST  2 VIEW  COMPARISON:  PA and lateral chest 10/23/2012.  FINDINGS: Heart size and mediastinal contours are within normal limits. Both lungs are clear. Visualized skeletal structures are unremarkable.  IMPRESSION: No active cardiopulmonary disease.   Electronically Signed   By: Kaimani Clayson  Dalessio M.D.   On: 04/14/2013 14:58    Cardiac Studies: Telemetry shows NSR Assessment/Plan:  1. Chest pain. No evidence of MI. For left heart cath today. 2. Diabetes mellitus 3. Morbid obesity. Will ask dietition to give nutritional counseling.  Plan: possibly home late today depending on cath results.  LOS: 1 day      [name] 04/15/2013, 7:45 AM    

## 2013-04-15 NOTE — Progress Notes (Signed)
Utilization Review Completed Willis Holquin J. Roshanda Balazs, RN, BSN, NCM [phone]  

## 2013-04-15 NOTE — Discharge Summary (Signed)
Patient seen and examined and history reviewed. Agree with above findings and plan. See cardiac cath results.  [name] 04/15/2013 3:53 PM

## 2013-04-17 ENCOUNTER — Telehealth: Payer: Self-pay | Admitting: *Deleted

## 2013-04-17 NOTE — Telephone Encounter (Signed)
Scheduled for 04/18/13. Pt notified if symptoms severe to go to the er.

## 2013-04-17 NOTE — Telephone Encounter (Signed)
Please arrange a office visit. If symptoms severe, slurred speech, facial numbness or paralysis: Needs to go to the ER

## 2013-04-17 NOTE — Telephone Encounter (Signed)
Patient called and stated that she has a cardiac catherization two days ago. She is now experiencing numbness and tingling in her right arm. She would also like to have clearance to continue her PT. Please advise.

## 2013-04-18 ENCOUNTER — Encounter: Payer: Self-pay | Admitting: Internal Medicine

## 2013-04-18 ENCOUNTER — Ambulatory Visit (INDEPENDENT_AMBULATORY_CARE_PROVIDER_SITE_OTHER): Payer: BC Managed Care – PPO | Admitting: Internal Medicine

## 2013-04-18 VITALS — BP 137/95 | HR 67 | Temp 98.3°F | Wt 304.0 lb

## 2013-04-18 DIAGNOSIS — R0789 Other chest pain: Secondary | ICD-10-CM

## 2013-04-18 DIAGNOSIS — M5412 Radiculopathy, cervical region: Secondary | ICD-10-CM

## 2013-04-18 DIAGNOSIS — M5010 Cervical disc disorder with radiculopathy, unspecified cervical region: Secondary | ICD-10-CM | POA: Insufficient documentation

## 2013-04-18 MED ORDER — TRAMADOL HCL 50 MG PO TABS: 50.0000 mg | ORAL_TABLET | Freq: Three times a day (TID) | ORAL | Status: DC | PRN

## 2013-04-18 NOTE — Progress Notes (Signed)
  Subjective:    Patient ID: [name], female    DOB: 01/27/1966, 47 y.o.   MRN: [id number]  HPI Acute visit 2 weeks history of chest pain and numbness in the right arm. Because of these symptoms, she underwent A cardiac catheterization 04/15/2013, no heart disease found. Since then, she has a persistent tingling of her whole right arm. On further questioning, she also has a right-sided neck pain for at least 2 weeks, worse in the last 3 days.  Past Medical History  Diagnosis Date  . Migraine   . Hypertension   . Obesity   . GERD (gastroesophageal reflux disease)   . Diabetes 02/14/2013  . Asthma     "only when I get a sinus infection"   Past Surgical History  Procedure Laterality Date  . Rotator cuff repair  2006, 2007  . Umbilical hernia repair      2841,3244  . Cholecystectomy  12/90  . Tubal ligation  1991   History   Social History  . Marital Status: Single    Spouse Name: N/A    Number of Children: 2  . Years of Education: N/A   Occupational History  . call center     Social History Main Topics  . Smoking status: Never Smoker   . Smokeless tobacco: Never Used  . Alcohol Use: No  . Drug Use: No  . Sexual Activity: Yes    Birth Control/ Protection: None   Other Topics Concern  . Not on file   Social History Narrative   Lives in [place] with her grown son.  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  Does not routinely exercise.      Review of Systems Chest pain resolved since that time she had a cardiac catheterization. Denies any slurred speech, diplopia. Was lighheaded  twice this week. No recent falls. No bladder or bowel incontinence. No  lower extremity paresthesias.     Objective:   Physical Exam General -- alert, well-developed, NAD.  Neck -- Slightly tender to palpation at the distal cervical spine Extremities--  Left Arm:   neurovascular and motor exam normal Right arm: The site of the cardiac  catheterization is cover with a Band-Aid, no swelling, no redness, no warmness. Good radial pulse. She also has a good pulse proximally by the elbow. Distally, the right hand is warm with good capillary refills , no skin lesions. Neurologic--  alert & oriented X3. Speech normal, gait normal, strength normal in all extremities.   Psych-- Cognition and judgment appear intact. Cooperative with normal attention span and concentration. No anxious appearing , no depressed appearing.      Assessment & Plan:

## 2013-04-18 NOTE — Assessment & Plan Note (Signed)
Arm pain and numbness, I believe the primary issue is a radiculopathy, will refer to orthopedic ASAP. She is allergic to Vicodin but can take ultram, rx provided  I don't believe she has any complications from the cardiac catheterization via the right arm but I will discuss the situation with cardiology. Addendum: d/w cards DOD, he agree that she doesn't have a complication from recent cath on clinical grounds.

## 2013-04-18 NOTE — Patient Instructions (Signed)
Take Tylenol as needed for pain, will cause drowsiness. Call anytime if your symptoms are severe, you have increased pain in the right arm or any discoloration in your right hand.

## 2013-04-18 NOTE — Assessment & Plan Note (Signed)
Recent cardiac catheterization negative

## 2013-04-20 ENCOUNTER — Other Ambulatory Visit: Payer: Self-pay | Admitting: Internal Medicine

## 2013-04-21 NOTE — Telephone Encounter (Signed)
rx refilled per protocol. DJR  

## 2013-04-22 ENCOUNTER — Emergency Department (HOSPITAL_COMMUNITY)
Admission: EM | Admit: 2013-04-22 | Discharge: 2013-04-22 | Disposition: A | Discharge status: Home / Self Care | Payer: BC Managed Care – PPO | Attending: Emergency Medicine | Admitting: Emergency Medicine

## 2013-04-22 ENCOUNTER — Encounter (HOSPITAL_COMMUNITY): Payer: Self-pay | Admitting: Emergency Medicine

## 2013-04-22 ENCOUNTER — Telehealth: Payer: Self-pay | Admitting: *Deleted

## 2013-04-22 ENCOUNTER — Emergency Department (HOSPITAL_COMMUNITY): Payer: BC Managed Care – PPO

## 2013-04-22 DIAGNOSIS — G43909 Migraine, unspecified, not intractable, without status migrainosus: Secondary | ICD-10-CM | POA: Insufficient documentation

## 2013-04-22 DIAGNOSIS — R209 Unspecified disturbances of skin sensation: Secondary | ICD-10-CM | POA: Insufficient documentation

## 2013-04-22 DIAGNOSIS — E669 Obesity, unspecified: Secondary | ICD-10-CM | POA: Insufficient documentation

## 2013-04-22 DIAGNOSIS — K219 Gastro-esophageal reflux disease without esophagitis: Secondary | ICD-10-CM | POA: Insufficient documentation

## 2013-04-22 DIAGNOSIS — I10 Essential (primary) hypertension: Secondary | ICD-10-CM | POA: Insufficient documentation

## 2013-04-22 DIAGNOSIS — Z79899 Other long term (current) drug therapy: Secondary | ICD-10-CM | POA: Insufficient documentation

## 2013-04-22 DIAGNOSIS — J45901 Unspecified asthma with (acute) exacerbation: Secondary | ICD-10-CM | POA: Insufficient documentation

## 2013-04-22 DIAGNOSIS — R0789 Other chest pain: Secondary | ICD-10-CM | POA: Diagnosis present

## 2013-04-22 DIAGNOSIS — E119 Type 2 diabetes mellitus without complications: Secondary | ICD-10-CM | POA: Insufficient documentation

## 2013-04-22 LAB — POCT I-STAT TROPONIN I: Troponin i, poc: 0 ng/mL (ref 0.00–0.08)

## 2013-04-22 LAB — CBC
HCT: 33.5 % — ABNORMAL LOW (ref 36.0–46.0)
Hemoglobin: 11.4 g/dL — ABNORMAL LOW (ref 12.0–15.0)
MCH: 27.3 pg (ref 26.0–34.0)
MCHC: 34 g/dL (ref 30.0–36.0)
MCV: 80.3 fL (ref 78.0–100.0)
Platelets: 313 10*3/uL (ref 150–400)
RBC: 4.17 MIL/uL (ref 3.87–5.11)
RDW: 14.3 % (ref 11.5–15.5)
WBC: 8.1 10*3/uL (ref 4.0–10.5)

## 2013-04-22 LAB — BASIC METABOLIC PANEL
BUN: 7 mg/dL (ref 6–23)
CO2: 24 mEq/L (ref 19–32)
Calcium: 9.3 mg/dL (ref 8.4–10.5)
Chloride: 101 mEq/L (ref 96–112)
Creatinine, Ser: 0.67 mg/dL (ref 0.50–1.10)
GFR calc Af Amer: >90 mL/min (ref >90)
GFR calc non Af Amer: >90 mL/min (ref >90)
Glucose, Bld: 147 mg/dL — ABNORMAL HIGH (ref 70–99)
Potassium: 3.6 mEq/L (ref 3.5–5.1)
Sodium: 136 mEq/L (ref 135–145)

## 2013-04-22 MED ORDER — LORAZEPAM 0.5 MG PO TABS
0.5000 mg | ORAL_TABLET | Freq: Once | ORAL | Status: AC
  Administered 2013-04-22: 0.5 mg via ORAL
  Filled 2013-04-22: qty 1

## 2013-04-22 MED ORDER — ALPRAZOLAM 0.25 MG PO TABS: 0.2500 mg | ORAL_TABLET | Freq: Three times a day (TID) | ORAL | Status: DC | PRN

## 2013-04-22 MED ORDER — ALPRAZOLAM 0.5 MG PO TABS
0.5000 mg | ORAL_TABLET | Freq: Once | ORAL | Status: AC
  Administered 2013-04-22: 0.5 mg via ORAL
  Filled 2013-04-22: qty 1

## 2013-04-22 MED ORDER — LORAZEPAM 2 MG/ML IJ SOLN
0.5000 mg | Freq: Once | INTRAMUSCULAR | Status: DC
  Filled 2013-04-22: qty 1

## 2013-04-22 NOTE — ED Provider Notes (Signed)
CSN: [id number]     Arrival date & time 04/22/13  1707 History   First MD Initiated Contact with Patient 04/22/13 1803     Chief Complaint  Patient presents with  . Chest Pain  . Numbness   (Consider location/radiation/quality/duration/timing/severity/associated sxs/prior Treatment) Patient is a 47 y.o. female presenting with chest pain. The history is provided by the patient.  Chest Pain Pain location:  Substernal area Pain quality: sharp   Pain radiates to:  R arm Pain radiates to the back: no   Pain severity:  Moderate Onset quality:  Sudden Duration:  10 minutes Timing:  Intermittent Progression:  Unchanged Chronicity:  New Context: at rest and stress   Context: not breathing   Relieved by:  Nothing Worsened by:  Nothing tried Ineffective treatments:  None tried Associated symptoms: shortness of breath   Associated symptoms: no abdominal pain, no back pain, no cough, no dizziness, no fatigue, no fever, no headache, no nausea and not vomiting     Past Medical History  Diagnosis Date  . Migraine   . Hypertension   . Obesity   . GERD (gastroesophageal reflux disease)   . Diabetes 02/14/2013  . Asthma     "only when I get a sinus infection"   Past Surgical History  Procedure Laterality Date  . Rotator cuff repair  2006, 2007  . Umbilical hernia repair      [id number]  . Cholecystectomy  12/90  . Tubal ligation  1991   Family History  Problem Relation Age of Onset  . Colon cancer Neg Hx   . Breast cancer       GM  . Diabetes Father     F, B, S  . Pancreatic cancer      GM  . CAD Sister     MI at age 78  . CAD Brother     CHF, died at age 25  . Heart attack Brother     died suddenly @ 8  . CAD Father     died of MI @ 59  . Kidney disease Father    History  Substance Use Topics  . Smoking status: Never Smoker   . Smokeless tobacco: Never Used  . Alcohol Use: No   OB History   Grav Para Term Preterm Abortions TAB SAB Ect Mult Living                 Review of Systems  Constitutional: Negative for fever and fatigue.  HENT: Negative for congestion and drooling.   Eyes: Negative for pain.  Respiratory: Positive for shortness of breath. Negative for cough.   Cardiovascular: Positive for chest pain.  Gastrointestinal: Negative for nausea, vomiting, abdominal pain and diarrhea.  Genitourinary: Negative for dysuria and hematuria.  Musculoskeletal: Negative for back pain, gait problem and neck pain.  Skin: Negative for color change.  Neurological: Negative for dizziness and headaches.  Hematological: Negative for adenopathy.  Psychiatric/Behavioral: Negative for behavioral problems.  All other systems reviewed and are negative.    Allergies  Vicodin  Home Medications   Current Outpatient Rx  Name  Route  Sig  Dispense  Refill  . fexofenadine (ALLEGRA) 180 MG tablet   Oral   Take 180 mg by mouth daily.         . metFORMIN (GLUCOPHAGE) 500 MG tablet   Oral   Take 1 tablet (500 mg total) by mouth 2 (two) times daily with a meal.  Resume Metformin on 04/17/2013.   . topiramate (TOPAMAX) 25 MG tablet   Oral   Take 50 mg by mouth at bedtime.          . traMADol (ULTRAM) 50 MG tablet   Oral   Take 1 tablet (50 mg total) by mouth every 8 (eight) hours as needed for pain.   30 tablet   0   . EXPIRED: esomeprazole (NEXIUM) 40 MG capsule   Oral   Take 40 mg by mouth daily.          BP 139/78  Pulse 95  Temp(Src) 98.8 F (37.1 C) (Oral)  Resp 14  SpO2 98%  LMP 04/10/2013 Physical Exam  Nursing note and vitals reviewed. Constitutional: She is oriented to person, place, and time. She appears well-developed and well-nourished.  HENT:  Head: Normocephalic.  Mouth/Throat: Oropharynx is clear and moist. No oropharyngeal exudate.  Eyes: Conjunctivae and EOM are normal. Pupils are equal, round, and reactive to light.  Neck: Normal range of motion. Neck supple.  Cardiovascular: Normal rate, regular rhythm,  normal heart sounds and intact distal pulses.  Exam reveals no gallop and no friction rub.   No murmur heard. Pulmonary/Chest: Effort normal and breath sounds normal. No respiratory distress. She has no wheezes.  Abdominal: Soft. Bowel sounds are normal. There is no tenderness. There is no rebound and no guarding.  Musculoskeletal: Normal range of motion. She exhibits no edema and no tenderness.  Neurological: She is alert and oriented to person, place, and time.  Skin: Skin is warm and dry.  Psychiatric: She has a normal mood and affect. Her behavior is normal.    ED Course  Procedures (including critical care time) Labs Review Labs Reviewed  CBC - Abnormal; Notable for the following:    Hemoglobin 11.4 (*)    HCT 33.5 (*)    All other components within normal limits  BASIC METABOLIC PANEL - Abnormal; Notable for the following:    Glucose, Bld 147 (*)    All other components within normal limits  POCT I-STAT TROPONIN I   Imaging Review Dg Chest 2 View  04/22/2013   CLINICAL DATA:  Chest pain.  EXAM: CHEST  2 VIEW  COMPARISON:  PA and lateral chest 04/14/2013.  FINDINGS: Heart size and mediastinal contours are within normal limits. Both lungs are clear. Postoperative change right shoulder noted.  IMPRESSION: No acute disease.   Electronically Signed   By: Drusilla Kanner M.D.   On: 04/22/2013 19:04    EKG Interpretation     Ventricular Rate:  89 PR Interval:  164 QRS Duration: 85 QT Interval:  369 QTC Calculation: 449 R Axis:   54 Text Interpretation:  Sinus rhythm Low voltage, precordial leads No significant change was found            MDM   1. Atypical chest pain    6:33 PM 47 y.o. female with a history of diabetes and hypertension who presents with intermittent sharp substernal chest pains with radiation to her right arm and numbness and tingling in her right arm which have been occurring for approximately 2 weeks. The patient notes that the episodes last  approximately 10 minutes or less at a time and are usually associated with emotional upset at work. She notes that her work has been increasingly stressful recently. She was seen recently at [facility] and admitted by cardiology for evaluation of this chest pain. She had a normal cardiac catheterization. She presents now for  continued symptoms. Wells/Perc neg. will get screening labs. I suspect her symptoms are due to increased stress and anxiety. Will give her a Xanax.  9:11 PM: Pt notes sx greatly improved w/ benzo's. Will provide Rx for prn xanax. I interpreted/reviewed the labs and/or imaging which were non-contributory.  I have discussed the diagnosis/risks/treatment options with the patient and believe the pt to be eligible for discharge home to follow-up with her pcp for continued sx. We also discussed returning to the ED immediately if new or worsening sx occur. We discussed the sx which are most concerning (e.g., worsening pain, sob) that necessitate immediate return. Any new prescriptions provided to the patient are listed below.  Discharge Medication List as of 04/22/2013  9:12 PM    START taking these medications   Details  ALPRAZolam (XANAX) 0.25 MG tablet Take 1 tablet (0.25 mg total) by mouth 3 (three) times daily as needed for anxiety., Starting 04/22/2013, Until Discontinued, Print         Junius Argyle, MD 04/23/13 1254

## 2013-04-22 NOTE — ED Notes (Signed)
Patient transported to X-ray 

## 2013-04-22 NOTE — ED Notes (Signed)
Patient is alert and oriented x3.  She was given DC instructions and follow up visit instructions.  Patient gave verbal understanding. She was DC ambulatory under her own power to home.  V/S stable.  He was not showing any signs of distress on DC 

## 2013-04-22 NOTE — Telephone Encounter (Signed)
Spoke with patient and she stated that she is having chest pain that radiates to her back and down her left arm. Patient was advised to go the ER and not to drive herself. Patient verbalized understanding.

## 2013-04-22 NOTE — ED Notes (Signed)
Pt c/o intermittent central chest pain and numbness/tingling in R arm.  Pain score 5/10.  Pt has recently be discharged from [facility] for same, last Tuesday.  Pt sts she has followed up with Cardiologist and has been referred to another "Specialist."  NAD noted.

## 2013-05-15 ENCOUNTER — Encounter: Payer: Self-pay | Admitting: Lab

## 2013-05-16 ENCOUNTER — Ambulatory Visit (INDEPENDENT_AMBULATORY_CARE_PROVIDER_SITE_OTHER): Payer: BC Managed Care – PPO | Admitting: Internal Medicine

## 2013-05-16 ENCOUNTER — Encounter: Payer: Self-pay | Admitting: Internal Medicine

## 2013-05-16 VITALS — BP 148/88 | HR 74 | Temp 98.6°F | Wt 307.0 lb

## 2013-05-16 DIAGNOSIS — R0789 Other chest pain: Secondary | ICD-10-CM

## 2013-05-16 DIAGNOSIS — E119 Type 2 diabetes mellitus without complications: Secondary | ICD-10-CM

## 2013-05-16 DIAGNOSIS — F411 Generalized anxiety disorder: Secondary | ICD-10-CM

## 2013-05-16 DIAGNOSIS — M5010 Cervical disc disorder with radiculopathy, unspecified cervical region: Secondary | ICD-10-CM

## 2013-05-16 DIAGNOSIS — M5412 Radiculopathy, cervical region: Secondary | ICD-10-CM

## 2013-05-16 HISTORY — DX: Generalized anxiety disorder: F41.1

## 2013-05-16 LAB — HEPATIC FUNCTION PANEL
ALT: 10 U/L (ref 0–35)
AST: 15 U/L (ref 0–37)
Albumin: 3.3 g/dL — ABNORMAL LOW (ref 3.5–5.2)
Alkaline Phosphatase: 57 U/L (ref 39–117)
Bilirubin, Direct: 0 mg/dL (ref 0.0–0.3)
Total Bilirubin: 0.5 mg/dL (ref 0.3–1.2)
Total Protein: 7.5 g/dL (ref 6.0–8.3)

## 2013-05-16 LAB — HEMOGLOBIN A1C: Hgb A1c MFr Bld: 6.6 % — ABNORMAL HIGH (ref 4.6–6.5)

## 2013-05-16 MED ORDER — ALPRAZOLAM 0.25 MG PO TABS: 0.2500 mg | ORAL_TABLET | Freq: Two times a day (BID) | ORAL | Status: DC | PRN

## 2013-05-16 MED ORDER — FLUOXETINE HCL 20 MG PO TABS: 20.0000 mg | ORAL_TABLET | Freq: Every day | ORAL | Status: DC

## 2013-05-16 NOTE — Progress Notes (Signed)
  Subjective:    Patient ID: [name], female    DOB: January 24, 1966, 47 y.o.   MRN: [id number]  HPI Followup Diabetes--good medication compliance, due for labs Chest pain-- went to the ER  weeks ago, ER doctor  Felt CP was  due to anxiety, on further questioning, the patient reports that she has steady pain in the chest with radiation to the right arm and  it is definitely exacerbated by emotional distress.  She admits having stress related to work, was prescribed Xanax which   make her sleepy and helps little. Chest pain is actually believed to be related to radiculopathy, she did see orthopedics, they're working on getting a MRI of the neck. GERD symptoms are well controlled on PPIs  Past Medical History  Diagnosis Date  . Migraine   . Hypertension   . Obesity   . GERD (gastroesophageal reflux disease)   . Diabetes 02/14/2013  . Asthma     "only when I get a sinus infection"  . H/O cardiac catheterization 10-14    negative  . Anxiety state, unspecified 05/16/2013   Past Surgical History  Procedure Laterality Date  . Rotator cuff repair  2006, 2007  . Umbilical hernia repair      [id number]  . Cholecystectomy  12/90  . Tubal ligation  1991   History   Social History  . Marital Status: Single    Spouse Name: N/A    Number of Children: 2  . Years of Education: N/A   Occupational History  . call center     Social History Main Topics  . Smoking status: Never Smoker   . Smokeless tobacco: Never Used  . Alcohol Use: No  . Drug Use: No  . Sexual Activity: Yes    Birth Control/ Protection: None   Other Topics Concern  . Not on file   Social History Narrative   Lives in [place] with her grown son.  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  Does not routinely exercise.      Review of Systems No nausea, vomiting, diarrhea. No shortness of breath or wheezing     Objective:   Physical Exam BP 148/88  Pulse 74  Temp(Src) 98.6 F (37  C)  Wt 307 lb (139.254 kg)  SpO2 98% General -- alert, well-developed.  Lungs -- normal respiratory effort, no intercostal retractions, no accessory muscle use, and normal breath sounds.  Heart-- normal rate, regular rhythm, no murmur.  Neurologic--  alert & oriented X3.   Psych--Cooperative with normal attention span and concentration. slt  anxious appearing , no depressed appearing.      Assessment & Plan:

## 2013-05-16 NOTE — Assessment & Plan Note (Signed)
Status post recent cardiac catheterization, negative, continue w/ symptoms  , related to radiculopathy

## 2013-05-16 NOTE — Assessment & Plan Note (Addendum)
Good compliance with metformin, will check labs. Recent BMP ok

## 2013-05-16 NOTE — Progress Notes (Signed)
Pre visit review using our clinic review tool, if applicable. No additional management support is needed unless otherwise documented below in the visit note. 

## 2013-05-16 NOTE — Assessment & Plan Note (Addendum)
New issue, she gets very anxious mostly related to work on and off. No depression. Does not like to take Xanax-prescribed at the ER- particularly because  make her sleepy. Plan: Start Prozac, see instructions, side effects discussed. Also recommend counseling. RF Xanax, recommend to use sparingly, avoid if needs to drive or be alert

## 2013-05-16 NOTE — Patient Instructions (Signed)
Get your blood work before you leave  Next visit in 4 to 6 weeks  for anxiety  follow up .No Fasting Please make an appointment

## 2013-05-16 NOTE — Assessment & Plan Note (Signed)
Status post recent cardiac catheterization, negative, continue w/ symptoms  , related to radiculopathy 

## 2013-05-16 NOTE — Assessment & Plan Note (Addendum)
Saw orthopedic surgery, they requested MRI of the neck and back, x-rays pending due to insurance issue. Continue with symptoms mostly chest pain radiation with a right arm exacerbated by anxiety. Plan: Continue with Ultram, follow up w/ ortho

## 2013-05-21 ENCOUNTER — Encounter: Payer: Self-pay | Admitting: *Deleted

## 2013-06-03 LAB — HM DIABETES EYE EXAM
HM Diabetic Eye Exam: NEGATIVE
HM Diabetic Eye Exam: NEGATIVE
HM Diabetic Eye Exam: NEGATIVE
HM Diabetic Eye Exam: NEGATIVE
HM Diabetic Eye Exam: NEGATIVE

## 2013-06-20 ENCOUNTER — Ambulatory Visit (INDEPENDENT_AMBULATORY_CARE_PROVIDER_SITE_OTHER): Payer: BC Managed Care – PPO | Admitting: Internal Medicine

## 2013-06-20 ENCOUNTER — Encounter: Payer: Self-pay | Admitting: Internal Medicine

## 2013-06-20 ENCOUNTER — Other Ambulatory Visit: Payer: Self-pay | Admitting: Internal Medicine

## 2013-06-20 VITALS — BP 127/83 | HR 88 | Temp 98.3°F | Wt 304.0 lb

## 2013-06-20 DIAGNOSIS — D649 Anemia, unspecified: Secondary | ICD-10-CM

## 2013-06-20 DIAGNOSIS — F411 Generalized anxiety disorder: Secondary | ICD-10-CM

## 2013-06-20 DIAGNOSIS — E119 Type 2 diabetes mellitus without complications: Secondary | ICD-10-CM

## 2013-06-20 LAB — CBC WITH DIFFERENTIAL/PLATELET
Basophils Absolute: 0 10*3/uL (ref 0.0–0.1)
Basophils Relative: 0.4 % (ref 0.0–3.0)
Eosinophils Absolute: 0.1 10*3/uL (ref 0.0–0.7)
Eosinophils Relative: 1 % (ref 0.0–5.0)
HCT: 37 % (ref 36.0–46.0)
Hemoglobin: 12.1 g/dL (ref 12.0–15.0)
Lymphocytes Relative: 30.8 % (ref 12.0–46.0)
Lymphs Abs: 2.7 10*3/uL (ref 0.7–4.0)
MCHC: 32.7 g/dL (ref 30.0–36.0)
MCV: 83.1 fl (ref 78.0–100.0)
Monocytes Absolute: 0.5 10*3/uL (ref 0.1–1.0)
Monocytes Relative: 5.3 % (ref 3.0–12.0)
Neutro Abs: 5.5 10*3/uL (ref 1.4–7.7)
Neutrophils Relative %: 62.5 % (ref 43.0–77.0)
Platelets: 309 10*3/uL (ref 150.0–400.0)
RBC: 4.46 Mil/uL (ref 3.87–5.11)
RDW: 14.9 % — ABNORMAL HIGH (ref 11.5–14.6)
WBC: 8.7 10*3/uL (ref 4.5–10.5)

## 2013-06-20 LAB — FOLATE: Folate: 11.7 ng/mL (ref >5.9)

## 2013-06-20 LAB — VITAMIN B12: Vitamin B-12: 198 pg/mL — ABNORMAL LOW (ref 211–911)

## 2013-06-20 LAB — IRON: Iron: 63 ug/dL (ref 42–145)

## 2013-06-20 LAB — FERRITIN: Ferritin: 21.3 ng/mL (ref 10.0–291.0)

## 2013-06-20 MED ORDER — FLUOXETINE HCL 20 MG PO TABS: 40.0000 mg | ORAL_TABLET | Freq: Every day | ORAL | Status: DC

## 2013-06-20 MED ORDER — METFORMIN HCL 500 MG PO TABS: 500.0000 mg | ORAL_TABLET | Freq: Two times a day (BID) | ORAL | Status: DC

## 2013-06-20 NOTE — Progress Notes (Signed)
   Subjective:    Patient ID: [name], female    DOB: [birth year], 47 y.o.   MRN: [id number]  HPI F/U from previous visit Anxiety--started Prozac 6 weeks ago, noted some help after one week, currently better but not where she needs to be. No apparent side effects. She also has anemia, see review of systems.    Past Medical History  Diagnosis Date  . Migraine   . Hypertension   . Obesity   . GERD (gastroesophageal reflux disease)   . Diabetes 02/14/2013  . Asthma     "only when I get a sinus infection"  . H/O cardiac catheterization 10-14    negative  . Anxiety state, unspecified 05/16/2013   Past Surgical History  Procedure Laterality Date  . Rotator cuff repair  2006, 2007  . Umbilical hernia repair      4098,1191  . Cholecystectomy  12/90  . Tubal ligation  1991   History   Social History  . Marital Status: Single    Spouse Name: N/A    Number of Children: 2  . Years of Education: N/A   Occupational History  . call center     Social History Main Topics  . Smoking status: Never Smoker   . Smokeless tobacco: Never Used  . Alcohol Use: No  . Drug Use: No  . Sexual Activity: Yes    Birth Control/ Protection: None   Other Topics Concern  . Not on file   Social History Narrative   Lives in [place] with her grown son.  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       Review of Systems Her periods are monthly, they last from 4-10 days, heavy the  first 2-4 days. Denies nausea, vomiting, diarrhea or blood in the stools. She continue with neck pain, followup by ortho. She continue with occasional chest pain, related to stress, not getting worse or more frequent No heartburn.    Objective:   Physical Exam BP 127/83  Pulse 88  Temp(Src) 98.3 F (36.8 C)  Wt 304 lb (137.893 kg)  SpO2 96% General -- alert, well-developed, NAD.  Lungs -- normal respiratory effort, no intercostal retractions, no accessory muscle use, and  normal breath sounds.  Heart-- normal rate, regular rhythm, no murmur.  Extremities-- no pretibial edema bilaterally  Neurologic--  alert & oriented X3. Speech normal, gait normal, strength normal in all extremities.  Psych-- Cognition and judgment appear intact. Cooperative with normal attention span and concentration. No anxious appearing , no depressed appearing.     Assessment & Plan:

## 2013-06-20 NOTE — Assessment & Plan Note (Signed)
Reassess and return to the office, I will ask her to come back fasting

## 2013-06-20 NOTE — Progress Notes (Signed)
Pre visit review using our clinic review tool, if applicable. No additional management support is needed unless otherwise documented below in the visit note. 

## 2013-06-20 NOTE — Patient Instructions (Signed)
Get your blood work before you leave  Next visit for a   follow up (30 minutes)  regards diabetes anxiety, fasting, in 3 months  Please make an appointment    Increase fluoxetine 20 mg to 1.5 tablet daily for 2 weeks, then take 2 tablets a day.

## 2013-06-20 NOTE — Assessment & Plan Note (Signed)
Persistent mild anemia, h/o remote EGD, never had a colonoscopy. Anemia may be related to excessive periods---> see review of systems. Plan: Labs, May need GI evaluation

## 2013-06-20 NOTE — Assessment & Plan Note (Signed)
Started Prozac 6 weeks ago and is improving but not where she needs to be. Increase it  from 20 mg --> 30 mg and then 40 mg, see instructions

## 2013-06-21 ENCOUNTER — Encounter: Payer: Self-pay | Admitting: Internal Medicine

## 2013-07-21 ENCOUNTER — Telehealth: Payer: Self-pay | Admitting: *Deleted

## 2013-07-21 NOTE — Telephone Encounter (Signed)
07/21/2013  Pt dropped off Pre-operative clearance form to be completed by [facility].  Attached billing form, put in Sandra's folder.  bw

## 2013-07-23 ENCOUNTER — Telehealth: Payer: Self-pay | Admitting: Internal Medicine

## 2013-07-23 DIAGNOSIS — Z0279 Encounter for issue of other medical certificate: Secondary | ICD-10-CM

## 2013-07-23 NOTE — Telephone Encounter (Signed)
Paperwork placed in Dr. Ethel Rana folder for review and completion.

## 2013-07-23 NOTE — Telephone Encounter (Signed)
Request receive for preop clearance, the patient recent had a cardiac catheterization which was normal, diabetes, hypertension and cholesterol are well-controlled. Patient is clear for surgery with close monitoring of her BPs and CBGs.

## 2013-07-25 ENCOUNTER — Telehealth: Payer: Self-pay | Admitting: *Deleted

## 2013-07-25 NOTE — Telephone Encounter (Signed)
07/24/2013 Pre-Op clearance signed by Larose Kells and faxed to [facility] by Shanon Brow. 07/25/2013 Copy sent to batch, copy sent to billing.  bw

## 2013-08-08 ENCOUNTER — Encounter: Payer: Self-pay | Admitting: Internal Medicine
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
## 2013-08-20 ENCOUNTER — Encounter (HOSPITAL_COMMUNITY): Payer: Self-pay | Admitting: Pharmacy Technician

## 2013-08-21 ENCOUNTER — Encounter (HOSPITAL_COMMUNITY)
Admission: RE | Admit: 2013-08-21 | Discharge: 2013-08-21 | Disposition: A | Discharge status: Home / Self Care | Payer: BC Managed Care – PPO | Source: Ambulatory Visit | Attending: Orthopedic Surgery | Admitting: Orthopedic Surgery

## 2013-08-21 ENCOUNTER — Encounter (HOSPITAL_COMMUNITY): Payer: Self-pay

## 2013-08-21 ENCOUNTER — Ambulatory Visit (HOSPITAL_COMMUNITY)
Admission: RE | Admit: 2013-08-21 | Discharge: 2013-08-21 | Disposition: A | Discharge status: Home / Self Care | Payer: BC Managed Care – PPO | Source: Ambulatory Visit | Attending: Orthopedic Surgery | Admitting: Orthopedic Surgery

## 2013-08-21 DIAGNOSIS — Z01818 Encounter for other preprocedural examination: Secondary | ICD-10-CM | POA: Insufficient documentation

## 2013-08-21 DIAGNOSIS — Z01812 Encounter for preprocedural laboratory examination: Secondary | ICD-10-CM | POA: Insufficient documentation

## 2013-08-21 HISTORY — DX: Unspecified osteoarthritis, unspecified site: M19.90

## 2013-08-21 HISTORY — DX: Sickle-cell trait: D57.3

## 2013-08-21 LAB — BASIC METABOLIC PANEL
BUN: 8 mg/dL (ref 6–23)
CO2: 22 mEq/L (ref 19–32)
Calcium: 9.5 mg/dL (ref 8.4–10.5)
Chloride: 101 mEq/L (ref 96–112)
Creatinine, Ser: 0.7 mg/dL (ref 0.50–1.10)
GFR calc Af Amer: >90 mL/min (ref >90)
GFR calc non Af Amer: >90 mL/min (ref >90)
Glucose, Bld: 128 mg/dL — ABNORMAL HIGH (ref 70–99)
Potassium: 4.1 mEq/L (ref 3.7–5.3)
Sodium: 137 mEq/L (ref 137–147)

## 2013-08-21 LAB — HCG, SERUM, QUALITATIVE: Preg, Serum: NEGATIVE

## 2013-08-21 LAB — CBC
HCT: 36.3 % (ref 36.0–46.0)
Hemoglobin: 12.6 g/dL (ref 12.0–15.0)
MCH: 27.9 pg (ref 26.0–34.0)
MCHC: 34.7 g/dL (ref 30.0–36.0)
MCV: 80.3 fL (ref 78.0–100.0)
Platelets: 327 10*3/uL (ref 150–400)
RBC: 4.52 MIL/uL (ref 3.87–5.11)
RDW: 14.2 % (ref 11.5–15.5)
WBC: 8.9 10*3/uL (ref 4.0–10.5)

## 2013-08-21 LAB — SURGICAL PCR SCREEN
MRSA, PCR: NEGATIVE
Staphylococcus aureus: NEGATIVE

## 2013-08-21 NOTE — Pre-Procedure Instructions (Signed)
[name]  08/21/2013   Your procedure is scheduled on: Wednesday, February 18.  Report to [facility], Main Entrance / Entrance "A" at 6:30AM.  Call this number if you have problems the morning of surgery: [phone]   Remember:   Do not eat food or drink liquids after midnight.   Take these medicines the morning of surgery with A SIP OF WATER: esomeprazole (NEXIUM), fexofenadine (ALLEGRA), FLUoxetine (PROZAC).    Take if needed:ALPRAZolam [name]), traMADol [name]).  Stop herbal medications (including vitamins) and do not take any Aspirin or Aspirin products or Ibuprofen (Advil) or  or Naproxen.  (Aleve)   Do not wear jewelry, make-up or nail polish.  Do not wear lotions, powders, or perfumes.  Do not shave 48 hours prior to surgery.   Do not bring valuables to the hospital.  [facility] is not responsible for any belongings or valuables.               Contacts, dentures or bridgework may not be worn into surgery.  Leave suitcase in the car. After surgery it may be brought to your room.  For patients admitted to the hospital, discharge time is determined by your treatment team.               Patients discharged the day of surgery will not be allowed to drive home.  Name and phone number of your drive   Please read over the following fact sheets that you were given: Pain Booklet, Coughing and Deep Breathing and Surgical Site Infection Prevention

## 2013-08-21 NOTE — Progress Notes (Signed)
[facility] - Preparing for Surgery  Before surgery, you can play an important role.  Because skin is not sterile, your skin needs to be as free of germs as possible.  You can reduce the number of germs on you skin by washing with CHG (chlorahexidine gluconate) soap before surgery.  CHG is an antiseptic cleaner which kills germs and bonds with the skin to continue killing germs even after washing.  Please DO NOT use if you have an allergy to CHG or antibacterial soaps.  If your skin becomes reddened/irritated stop using the CHG and inform your nurse when you arrive at Short Stay.  Do not shave (including legs and underarms) for at least 48 hours prior to the first CHG shower.  You may shave your face.  Please follow these instructions carefully:   1.  Shower with CHG Soap the night before surgery and the morning of Surgery.  2.  If you choose to wash your hair, wash your hair first as usual with your normal shampoo.  3.  After you shampoo, rinse your hair and body thoroughly to remove the  Shampoo.  4.  Use CHG as you would any other liquid soap.  You can apply chg directly to the skin and wash gently with scrungie or a clean washcloth.  5.  Apply the CHG Soap to your body ONLY FROM THE NECK DOWN.        Do not use on open wounds or open sores.  Avoid contact with your eyes,ears, mouth and genitals (private parts).  Wash genitals (private parts)with your normal soap.  6.  Wash thoroughly, paying special attention to the area where your surgery will be performed.  7.  Thoroughly rinse your body with warm water from the neck down.  8.  DO NOT shower/wash with your normal soap after using and rinsing off the CHG Soap.  9.  Pat yourself dry with a clean towel.            10.  Wear clean pajamas.            11.  Place clean sheets on your bed the night of your first shower and do not sleep with pets.  Day of Surgery  Do not apply any lotions/deodorants the morning of surgery.  Please wear clean  clothes to the hospital/surgery center.

## 2013-08-26 MED ORDER — DEXAMETHASONE SODIUM PHOSPHATE 4 MG/ML IJ SOLN
4.0000 mg | Freq: Once | INTRAMUSCULAR | Status: AC
  Administered 2013-08-27 (×2): 4 mg via INTRAVENOUS
  Filled 2013-08-26: qty 1

## 2013-08-26 MED ORDER — ACETAMINOPHEN 10 MG/ML IV SOLN
1000.0000 mg | Freq: Four times a day (QID) | INTRAVENOUS | Status: DC
  Administered 2013-08-27: 1000 mg via INTRAVENOUS
  Filled 2013-08-26: qty 100

## 2013-08-27 ENCOUNTER — Ambulatory Visit (HOSPITAL_COMMUNITY)
Admission: RE | Admit: 2013-08-27 | Discharge: 2013-08-29 | Disposition: A | Discharge status: Home / Self Care | Payer: BC Managed Care – PPO | Source: Ambulatory Visit | Attending: Orthopedic Surgery | Admitting: Orthopedic Surgery

## 2013-08-27 ENCOUNTER — Encounter (HOSPITAL_COMMUNITY): Payer: BC Managed Care – PPO | Admitting: Certified Registered Nurse Anesthetist

## 2013-08-27 ENCOUNTER — Encounter (HOSPITAL_COMMUNITY)
Admission: RE | Disposition: A | Discharge status: Home / Self Care | Payer: Self-pay | Source: Ambulatory Visit | Attending: Orthopedic Surgery

## 2013-08-27 ENCOUNTER — Ambulatory Visit (HOSPITAL_COMMUNITY): Payer: BC Managed Care – PPO

## 2013-08-27 ENCOUNTER — Ambulatory Visit (HOSPITAL_COMMUNITY): Payer: BC Managed Care – PPO | Admitting: Certified Registered Nurse Anesthetist

## 2013-08-27 ENCOUNTER — Observation Stay (HOSPITAL_COMMUNITY): Payer: BC Managed Care – PPO

## 2013-08-27 ENCOUNTER — Encounter (HOSPITAL_COMMUNITY): Payer: Self-pay | Admitting: Certified Registered Nurse Anesthetist

## 2013-08-27 DIAGNOSIS — Z981 Arthrodesis status: Secondary | ICD-10-CM

## 2013-08-27 DIAGNOSIS — D573 Sickle-cell trait: Secondary | ICD-10-CM | POA: Insufficient documentation

## 2013-08-27 DIAGNOSIS — IMO0002 Reserved for concepts with insufficient information to code with codable children: Secondary | ICD-10-CM | POA: Insufficient documentation

## 2013-08-27 DIAGNOSIS — M51379 Other intervertebral disc degeneration, lumbosacral region without mention of lumbar back pain or lower extremity pain: Secondary | ICD-10-CM | POA: Insufficient documentation

## 2013-08-27 DIAGNOSIS — M47812 Spondylosis without myelopathy or radiculopathy, cervical region: Secondary | ICD-10-CM | POA: Insufficient documentation

## 2013-08-27 DIAGNOSIS — G43909 Migraine, unspecified, not intractable, without status migrainosus: Secondary | ICD-10-CM | POA: Insufficient documentation

## 2013-08-27 DIAGNOSIS — E119 Type 2 diabetes mellitus without complications: Secondary | ICD-10-CM | POA: Insufficient documentation

## 2013-08-27 DIAGNOSIS — Z23 Encounter for immunization: Secondary | ICD-10-CM | POA: Insufficient documentation

## 2013-08-27 DIAGNOSIS — M1710 Unilateral primary osteoarthritis, unspecified knee: Secondary | ICD-10-CM | POA: Insufficient documentation

## 2013-08-27 DIAGNOSIS — M5137 Other intervertebral disc degeneration, lumbosacral region: Secondary | ICD-10-CM | POA: Insufficient documentation

## 2013-08-27 DIAGNOSIS — J45909 Unspecified asthma, uncomplicated: Secondary | ICD-10-CM | POA: Insufficient documentation

## 2013-08-27 DIAGNOSIS — K219 Gastro-esophageal reflux disease without esophagitis: Secondary | ICD-10-CM | POA: Insufficient documentation

## 2013-08-27 DIAGNOSIS — I10 Essential (primary) hypertension: Secondary | ICD-10-CM | POA: Insufficient documentation

## 2013-08-27 DIAGNOSIS — F411 Generalized anxiety disorder: Secondary | ICD-10-CM | POA: Insufficient documentation

## 2013-08-27 DIAGNOSIS — M542 Cervicalgia: Secondary | ICD-10-CM | POA: Diagnosis present

## 2013-08-27 HISTORY — PX: ANTERIOR CERVICAL DECOMP/DISCECTOMY FUSION: SHX1161

## 2013-08-27 LAB — GLUCOSE, CAPILLARY
Glucose-Capillary: 132 mg/dL — ABNORMAL HIGH (ref 70–99)
Glucose-Capillary: 183 mg/dL — ABNORMAL HIGH (ref 70–99)
Glucose-Capillary: 151 mg/dL — ABNORMAL HIGH (ref 70–99)

## 2013-08-27 LAB — HEMOGLOBIN A1C
Hgb A1c MFr Bld: 6.8 % — ABNORMAL HIGH (ref <5.7)
Mean Plasma Glucose: 148 mg/dL — ABNORMAL HIGH (ref <117)

## 2013-08-27 SURGERY — ANTERIOR CERVICAL DECOMPRESSION/DISCECTOMY FUSION 2 LEVELS
Anesthesia: General | Site: Neck

## 2013-08-27 MED ORDER — INFLUENZA VAC SPLIT QUAD 0.5 ML IM SUSP
0.5000 mL | INTRAMUSCULAR | Status: AC
  Administered 2013-08-28: 0.5 mL via INTRAMUSCULAR
  Filled 2013-08-27: qty 0.5

## 2013-08-27 MED ORDER — LACTATED RINGERS IV SOLN
INTRAVENOUS | Status: DC | PRN
  Administered 2013-08-27 (×2): via INTRAVENOUS

## 2013-08-27 MED ORDER — ONDANSETRON HCL 4 MG/2ML IJ SOLN
INTRAMUSCULAR | Status: DC | PRN
  Administered 2013-08-27: 4 mg via INTRAVENOUS

## 2013-08-27 MED ORDER — GLYCOPYRROLATE 0.2 MG/ML IJ SOLN
INTRAMUSCULAR | Status: AC
  Filled 2013-08-27: qty 4

## 2013-08-27 MED ORDER — FENTANYL CITRATE 0.05 MG/ML IJ SOLN
INTRAMUSCULAR | Status: AC
  Filled 2013-08-27: qty 5

## 2013-08-27 MED ORDER — THROMBIN 20000 UNITS EX SOLR
CUTANEOUS | Status: DC | PRN
  Administered 2013-08-27: 10:00:00 via TOPICAL

## 2013-08-27 MED ORDER — SUCCINYLCHOLINE CHLORIDE 20 MG/ML IJ SOLN
INTRAMUSCULAR | Status: DC | PRN
  Administered 2013-08-27: 100 mg via INTRAVENOUS

## 2013-08-27 MED ORDER — FLUOXETINE HCL 20 MG PO TABS
40.0000 mg | ORAL_TABLET | Freq: Every day | ORAL | Status: DC
  Administered 2013-08-27: 40 mg via ORAL
  Filled 2013-08-27 (×3): qty 2

## 2013-08-27 MED ORDER — SODIUM CHLORIDE 0.9 % IJ SOLN: 3.0000 mL | INTRAMUSCULAR | Status: DC | PRN

## 2013-08-27 MED ORDER — ROCURONIUM BROMIDE 50 MG/5ML IV SOLN
INTRAVENOUS | Status: AC
  Filled 2013-08-27: qty 1

## 2013-08-27 MED ORDER — PROPOFOL 10 MG/ML IV BOLUS
INTRAVENOUS | Status: AC
Start: 2013-08-27 — End: 2013-08-27
  Filled 2013-08-27: qty 20

## 2013-08-27 MED ORDER — MIDAZOLAM HCL 2 MG/2ML IJ SOLN
INTRAMUSCULAR | Status: AC
  Filled 2013-08-27: qty 2

## 2013-08-27 MED ORDER — ALBUMIN HUMAN 5 % IV SOLN
INTRAVENOUS | Status: DC | PRN
  Administered 2013-08-27: 11:00:00 via INTRAVENOUS

## 2013-08-27 MED ORDER — NEOSTIGMINE METHYLSULFATE 1 MG/ML IJ SOLN
INTRAMUSCULAR | Status: DC | PRN
  Administered 2013-08-27: 5 mg via INTRAVENOUS

## 2013-08-27 MED ORDER — OXYCODONE HCL 5 MG PO TABS
ORAL_TABLET | ORAL | Status: AC
  Filled 2013-08-27: qty 2

## 2013-08-27 MED ORDER — ONDANSETRON HCL 4 MG/2ML IJ SOLN: 4.0000 mg | INTRAMUSCULAR | Status: DC | PRN

## 2013-08-27 MED ORDER — NEOSTIGMINE METHYLSULFATE 1 MG/ML IJ SOLN
INTRAMUSCULAR | Status: AC
  Filled 2013-08-27: qty 10

## 2013-08-27 MED ORDER — ESMOLOL HCL 10 MG/ML IV SOLN
INTRAVENOUS | Status: DC | PRN
  Administered 2013-08-27: 30 mg via INTRAVENOUS

## 2013-08-27 MED ORDER — MORPHINE SULFATE 2 MG/ML IJ SOLN
1.0000 mg | INTRAMUSCULAR | Status: DC | PRN
  Administered 2013-08-27 – 2013-08-28 (×2): 1 mg via INTRAVENOUS
  Filled 2013-08-27 (×2): qty 1

## 2013-08-27 MED ORDER — FUROSEMIDE 10 MG/ML IJ SOLN
INTRAMUSCULAR | Status: AC
  Administered 2013-08-27: 20 mg
  Filled 2013-08-27: qty 4

## 2013-08-27 MED ORDER — 0.9 % SODIUM CHLORIDE (POUR BTL) OPTIME
TOPICAL | Status: DC | PRN
  Administered 2013-08-27 (×2): 1000 mL

## 2013-08-27 MED ORDER — ALPRAZOLAM 0.25 MG PO TABS: 0.2500 mg | ORAL_TABLET | Freq: Two times a day (BID) | ORAL | Status: DC | PRN

## 2013-08-27 MED ORDER — FENTANYL CITRATE 0.05 MG/ML IJ SOLN
25.0000 ug | INTRAMUSCULAR | Status: DC | PRN
Start: 2013-08-27 — End: 2013-08-27
  Administered 2013-08-27 (×2): 50 ug via INTRAVENOUS

## 2013-08-27 MED ORDER — METHOCARBAMOL 500 MG PO TABS
ORAL_TABLET | ORAL | Status: AC
  Administered 2013-08-28: 500 mg via ORAL
  Filled 2013-08-27: qty 1

## 2013-08-27 MED ORDER — ACETAMINOPHEN 10 MG/ML IV SOLN
1000.0000 mg | Freq: Four times a day (QID) | INTRAVENOUS | Status: AC
  Administered 2013-08-27 – 2013-08-28 (×4): 1000 mg via INTRAVENOUS
  Filled 2013-08-27 (×4): qty 100

## 2013-08-27 MED ORDER — MENTHOL 3 MG MT LOZG
1.0000 | LOZENGE | OROMUCOSAL | Status: DC | PRN
  Administered 2013-08-28: 3 mg via ORAL
  Filled 2013-08-27: qty 9

## 2013-08-27 MED ORDER — BUPIVACAINE-EPINEPHRINE (PF) 0.25% -1:200000 IJ SOLN
INTRAMUSCULAR | Status: AC
  Filled 2013-08-27: qty 30

## 2013-08-27 MED ORDER — LIDOCAINE HCL (CARDIAC) 20 MG/ML IV SOLN
INTRAVENOUS | Status: AC
Start: 2013-08-27 — End: 2013-08-27
  Filled 2013-08-27: qty 10

## 2013-08-27 MED ORDER — GLYCOPYRROLATE 0.2 MG/ML IJ SOLN
INTRAMUSCULAR | Status: DC | PRN
  Administered 2013-08-27: .8 mg via INTRAVENOUS

## 2013-08-27 MED ORDER — THROMBIN 20000 UNITS EX SOLR
CUTANEOUS | Status: AC
  Filled 2013-08-27: qty 20000

## 2013-08-27 MED ORDER — OXYCODONE HCL 5 MG PO TABS
10.0000 mg | ORAL_TABLET | ORAL | Status: DC | PRN
  Administered 2013-08-27 – 2013-08-28 (×4): 10 mg via ORAL
  Filled 2013-08-27 (×3): qty 2

## 2013-08-27 MED ORDER — MIDAZOLAM HCL 5 MG/5ML IJ SOLN
INTRAMUSCULAR | Status: DC | PRN
  Administered 2013-08-27: 2 mg via INTRAVENOUS

## 2013-08-27 MED ORDER — LACTATED RINGERS IV SOLN: INTRAVENOUS | Status: DC

## 2013-08-27 MED ORDER — METFORMIN HCL 500 MG PO TABS
500.0000 mg | ORAL_TABLET | Freq: Two times a day (BID) | ORAL | Status: DC
  Administered 2013-08-27 – 2013-08-29 (×4): 500 mg via ORAL
  Filled 2013-08-27 (×9): qty 1

## 2013-08-27 MED ORDER — FENTANYL CITRATE 0.05 MG/ML IJ SOLN
INTRAMUSCULAR | Status: DC | PRN
  Administered 2013-08-27 (×2): 50 ug via INTRAVENOUS
  Administered 2013-08-27: 150 ug via INTRAVENOUS
  Administered 2013-08-27: 50 ug via INTRAVENOUS

## 2013-08-27 MED ORDER — METHOCARBAMOL 500 MG PO TABS
500.0000 mg | ORAL_TABLET | Freq: Four times a day (QID) | ORAL | Status: DC | PRN
  Administered 2013-08-27 – 2013-08-29 (×6): 500 mg via ORAL
  Filled 2013-08-27 (×5): qty 1

## 2013-08-27 MED ORDER — PHENYLEPHRINE HCL 10 MG/ML IJ SOLN
10.0000 mg | INTRAVENOUS | Status: DC | PRN
  Administered 2013-08-27: 10 ug/min via INTRAVENOUS

## 2013-08-27 MED ORDER — PHENYLEPHRINE HCL 10 MG/ML IJ SOLN
INTRAMUSCULAR | Status: DC | PRN
  Administered 2013-08-27: 80 ug via INTRAVENOUS
  Administered 2013-08-27: 40 ug via INTRAVENOUS
  Administered 2013-08-27: 80 ug via INTRAVENOUS
  Administered 2013-08-27 (×5): 40 ug via INTRAVENOUS

## 2013-08-27 MED ORDER — FENTANYL CITRATE 0.05 MG/ML IJ SOLN
INTRAMUSCULAR | Status: AC
  Filled 2013-08-27: qty 2

## 2013-08-27 MED ORDER — SUCCINYLCHOLINE CHLORIDE 20 MG/ML IJ SOLN
INTRAMUSCULAR | Status: AC
  Filled 2013-08-27: qty 1

## 2013-08-27 MED ORDER — SODIUM CHLORIDE 0.9 % IV SOLN: 250.0000 mL | INTRAVENOUS | Status: DC

## 2013-08-27 MED ORDER — ROCURONIUM BROMIDE 100 MG/10ML IV SOLN
INTRAVENOUS | Status: DC | PRN
  Administered 2013-08-27: 10 mg via INTRAVENOUS
  Administered 2013-08-27: 50 mg via INTRAVENOUS
  Administered 2013-08-27: 10 mg via INTRAVENOUS

## 2013-08-27 MED ORDER — DEXTROSE 5 % IV SOLN
500.0000 mg | Freq: Four times a day (QID) | INTRAVENOUS | Status: DC | PRN
  Filled 2013-08-27: qty 5

## 2013-08-27 MED ORDER — ALBUTEROL SULFATE HFA 108 (90 BASE) MCG/ACT IN AERS
INHALATION_SPRAY | RESPIRATORY_TRACT | Status: DC | PRN
  Administered 2013-08-27 (×2): 2 via RESPIRATORY_TRACT

## 2013-08-27 MED ORDER — SODIUM CHLORIDE 0.9 % IJ SOLN
3.0000 mL | Freq: Two times a day (BID) | INTRAMUSCULAR | Status: DC
  Administered 2013-08-28: 3 mL via INTRAVENOUS

## 2013-08-27 MED ORDER — LIDOCAINE HCL 4 % MT SOLN
OROMUCOSAL | Status: DC | PRN
  Administered 2013-08-27: 4 mL via TOPICAL

## 2013-08-27 MED ORDER — ONDANSETRON HCL 4 MG/2ML IJ SOLN
INTRAMUSCULAR | Status: AC
  Filled 2013-08-27: qty 2

## 2013-08-27 MED ORDER — PROPOFOL 10 MG/ML IV BOLUS
INTRAVENOUS | Status: DC | PRN
  Administered 2013-08-27: 200 mg via INTRAVENOUS

## 2013-08-27 MED ORDER — CEFAZOLIN SODIUM-DEXTROSE 2-3 GM-% IV SOLR
INTRAVENOUS | Status: DC | PRN
  Administered 2013-08-27: 2 g via INTRAVENOUS

## 2013-08-27 MED ORDER — CEFAZOLIN SODIUM-DEXTROSE 2-3 GM-% IV SOLR
INTRAVENOUS | Status: AC
  Filled 2013-08-27: qty 50

## 2013-08-27 MED ORDER — BUPIVACAINE-EPINEPHRINE 0.25% -1:200000 IJ SOLN
INTRAMUSCULAR | Status: DC | PRN
  Administered 2013-08-27: 4 mL

## 2013-08-27 MED ORDER — INSULIN ASPART 100 UNIT/ML ~~LOC~~ SOLN
0.0000 [IU] | SUBCUTANEOUS | Status: DC
  Administered 2013-08-27 (×2): 3 [IU] via SUBCUTANEOUS
  Administered 2013-08-28 (×4): 2 [IU] via SUBCUTANEOUS
  Administered 2013-08-28: 09:00:00 via SUBCUTANEOUS
  Administered 2013-08-29: 2 [IU] via SUBCUTANEOUS

## 2013-08-27 MED ORDER — ALBUTEROL SULFATE (2.5 MG/3ML) 0.083% IN NEBU
INHALATION_SOLUTION | RESPIRATORY_TRACT | Status: AC
  Administered 2013-08-27: 2.5 mg via RESPIRATORY_TRACT
  Filled 2013-08-27: qty 3

## 2013-08-27 MED ORDER — ALBUTEROL SULFATE HFA 108 (90 BASE) MCG/ACT IN AERS
INHALATION_SPRAY | RESPIRATORY_TRACT | Status: AC
  Filled 2013-08-27: qty 6.7

## 2013-08-27 MED ORDER — LIDOCAINE HCL (CARDIAC) 20 MG/ML IV SOLN
INTRAVENOUS | Status: DC | PRN
  Administered 2013-08-27: 80 mg via INTRAVENOUS

## 2013-08-27 MED ORDER — PHENOL 1.4 % MT LIQD: 1.0000 | OROMUCOSAL | Status: DC | PRN

## 2013-08-27 MED ORDER — PHENYLEPHRINE 40 MCG/ML (10ML) SYRINGE FOR IV PUSH (FOR BLOOD PRESSURE SUPPORT)
PREFILLED_SYRINGE | INTRAVENOUS | Status: AC
  Filled 2013-08-27: qty 10

## 2013-08-27 MED ORDER — TOPIRAMATE 25 MG PO TABS
50.0000 mg | ORAL_TABLET | Freq: Every day | ORAL | Status: DC
  Administered 2013-08-27 – 2013-08-28 (×2): 50 mg via ORAL
  Filled 2013-08-27 (×3): qty 2

## 2013-08-27 MED ORDER — CEFAZOLIN SODIUM 1-5 GM-% IV SOLN
1.0000 g | Freq: Three times a day (TID) | INTRAVENOUS | Status: AC
  Administered 2013-08-27 – 2013-08-28 (×2): 1 g via INTRAVENOUS
  Filled 2013-08-27 (×2): qty 50

## 2013-08-27 SURGICAL SUPPLY — 58 items
BLADE SURG ROTATE 9660 (MISCELLANEOUS) IMPLANT
BUR EGG ELITE 4.0 (BURR) IMPLANT
BUR MATCHSTICK NEURO 3.0 LAGG (BURR) IMPLANT
CANISTER SUCTION 2500CC (MISCELLANEOUS) ×2 IMPLANT
CLOTH BEACON ORANGE TIMEOUT ST (SAFETY) ×2 IMPLANT
CLSR STERI-STRIP ANTIMIC 1/2X4 (GAUZE/BANDAGES/DRESSINGS) ×2 IMPLANT
CORDS BIPOLAR (ELECTRODE) ×2 IMPLANT
COVER SURGICAL LIGHT HANDLE (MISCELLANEOUS) ×2 IMPLANT
CRADLE DONUT ADULT HEAD (MISCELLANEOUS) ×2 IMPLANT
DRAPE C-ARM 42X72 X-RAY (DRAPES) ×2 IMPLANT
DRAPE POUCH INSTRU U-SHP 10X18 (DRAPES) ×2 IMPLANT
DRAPE SURG 17X23 STRL (DRAPES) ×2 IMPLANT
DRAPE U-SHAPE 47X51 STRL (DRAPES) ×2 IMPLANT
DRSG MEPILEX BORDER 4X4 (GAUZE/BANDAGES/DRESSINGS) ×2 IMPLANT
DURAPREP 26ML APPLICATOR (WOUND CARE) ×2 IMPLANT
ELECT COATED BLADE 2.86 ST (ELECTRODE) ×2 IMPLANT
ELECT REM PT RETURN 9FT ADLT (ELECTROSURGICAL) ×2
ELECTRODE REM PT RTRN 9FT ADLT (ELECTROSURGICAL) ×1 IMPLANT
ENDOSKELETON LG TC 6VBR 8MM (Orthopedic Implant) ×4 IMPLANT
GLOVE BIOGEL PI IND STRL 8 (GLOVE) ×1 IMPLANT
GLOVE BIOGEL PI IND STRL 8.5 (GLOVE) ×1 IMPLANT
GLOVE BIOGEL PI INDICATOR 8 (GLOVE) ×1
GLOVE BIOGEL PI INDICATOR 8.5 (GLOVE) ×1
GLOVE ECLIPSE 8.5 STRL (GLOVE) ×2 IMPLANT
GLOVE ORTHO TXT STRL SZ7.5 (GLOVE) ×2 IMPLANT
GOWN STRL REIN 2XL XLG LVL4 (GOWN DISPOSABLE) IMPLANT
GOWN STRL REIN XL XLG (GOWN DISPOSABLE) IMPLANT
GOWN STRL REUS W/TWL 2XL LVL3 (GOWN DISPOSABLE) ×2 IMPLANT
KIT BASIN OR (CUSTOM PROCEDURE TRAY) ×2 IMPLANT
KIT ROOM TURNOVER OR (KITS) ×2 IMPLANT
NEEDLE SPNL 18GX3.5 QUINCKE PK (NEEDLE) ×2 IMPLANT
NS IRRIG 1000ML POUR BTL (IV SOLUTION) ×2 IMPLANT
PACK ORTHO CERVICAL (CUSTOM PROCEDURE TRAY) ×2 IMPLANT
PACK UNIVERSAL I (CUSTOM PROCEDURE TRAY) ×2 IMPLANT
PAD ARMBOARD 7.5X6 YLW CONV (MISCELLANEOUS) ×4 IMPLANT
PATTIES SURGICAL .25X.25 (GAUZE/BANDAGES/DRESSINGS) ×2 IMPLANT
PIN RETAINER PRODISC 14 MM (PIN) ×2 IMPLANT
PLATE SKYLINE 2 LEVEL 34MM (Plate) ×2 IMPLANT
PUTTY BONE DBX 5CC MIX (Putty) ×2 IMPLANT
RESTRAINT LIMB HOLDER UNIV (RESTRAINTS) ×2 IMPLANT
SCREW SELF DRILL SKYLINE 12MM (Screw) ×2 IMPLANT
SCREW SKYLINE 14MM SD-VA (Screw) ×8 IMPLANT
SPONGE INTESTINAL PEANUT (DISPOSABLE) ×2 IMPLANT
SPONGE LAP 4X18 X RAY DECT (DISPOSABLE) ×4 IMPLANT
SPONGE SURGIFOAM ABS GEL 100 (HEMOSTASIS) ×2 IMPLANT
SURGIFLO TRUKIT (HEMOSTASIS) IMPLANT
SUT MON AB 3-0 SH 27 (SUTURE) ×1
SUT MON AB 3-0 SH27 (SUTURE) ×1 IMPLANT
SUT SILK 2 0 (SUTURE)
SUT SILK 2-0 18XBRD TIE 12 (SUTURE) IMPLANT
SUT VIC AB 2-0 CT1 18 (SUTURE) ×2 IMPLANT
SYR BULB IRRIGATION 50ML (SYRINGE) ×2 IMPLANT
SYR CONTROL 10ML LL (SYRINGE) ×2 IMPLANT
TAPE CLOTH 4X10 WHT NS (GAUZE/BANDAGES/DRESSINGS) ×2 IMPLANT
TAPE UMBILICAL COTTON 1/8X30 (MISCELLANEOUS) ×2 IMPLANT
TOWEL OR 17X24 6PK STRL BLUE (TOWEL DISPOSABLE) ×2 IMPLANT
TOWEL OR 17X26 10 PK STRL BLUE (TOWEL DISPOSABLE) ×2 IMPLANT
WATER STERILE IRR 1000ML POUR (IV SOLUTION) IMPLANT

## 2013-08-27 NOTE — H&P (Signed)
No change in clinical exam H+P reviewed  

## 2013-08-27 NOTE — Preoperative (Signed)
Beta Blockers   Reason not to administer Beta Blockers:Not Applicable 

## 2013-08-27 NOTE — Progress Notes (Signed)
Bedside consult with dr edwards/mda/ pain well controlled sats =/> 92 on 2 lnc, rare wheeze, no dyspnea, has diuresed 620mls after 20mg  IV Lasix at 1255

## 2013-08-27 NOTE — Progress Notes (Signed)
Patient out from OR noted very wet sounding lung sounds in all 4 quadrants.  Notified Dr. Oletta Lamas, orders received.

## 2013-08-27 NOTE — Transfer of Care (Signed)
Immediate Anesthesia Transfer of Care Note  Patient: [name]  Procedure(s) Performed: Procedure(s): ANTERIOR CERVICAL /DISCECTOMY FUSION (ACDF) C5-C7  2 LEVELS (N/A)  Patient Location: PACU  Anesthesia Type:General  Level of Consciousness: awake and alert   Airway & Oxygen Therapy: Patient Spontanous Breathing and Patient connected to face mask oxygen  Post-op Assessment: Report given to PACU RN, Post -op Vital signs reviewed and stable and Patient moving all extremities X 4  Post vital signs: Reviewed and stable  Complications: No apparent anesthesia complications

## 2013-08-27 NOTE — Evaluation (Signed)
Physical Therapy Evaluation Patient Details Name: [name] MRN: [id number] DOB: Aug 12, 1965 Today's Date: 08/27/2013 Time: 9417-4081 PT Time Calculation (min): 29 min  PT Assessment / Plan / Recommendation History of Present Illness  s/p anterior C5-C7 fusion  Clinical Impression  Patient is s/p anterior cervical discectomy fusion C5-C7surgery resulting in the deficits listed below (see PT Problem List).  Patient will benefit from skilled PT to increase their independence and safety with mobility (while adhering to their precautions) to allow discharge home with family.  Pt limited in evaluation to sitting EOB and lateral steps to [facility] secondary to dizziness and pain. Anticipate good progress by pt. Pt is highly motivated. Pt will have 24/7 (A) upon D/C. Will plan to assess need for RW during next session.      PT Assessment  Patient needs continued PT services    Follow Up Recommendations  No PT follow up;Supervision/Assistance - 24 hour    Does the patient have the potential to tolerate intense rehabilitation      Barriers to Discharge        Equipment Recommendations  3in1 (PT);Other (comment) (will assess need for RW next session)    Recommendations for Other Services OT consult   Frequency Min 5X/week    Precautions / Restrictions Precautions Precautions: Fall;Cervical Precaution Comments: pt given handout for cervical neck precautions and reviewed throughout session  Required Braces or Orthoses: Cervical Brace Cervical Brace: Hard collar;At all times;Other (comment) (there is a soft collar in the room ) Restrictions Weight Bearing Restrictions: No   Pertinent Vitals/Pain 8/10; patient repositioned for comfort and pt premedicated by RN     Mobility  Bed Mobility Overal bed mobility: Needs Assistance Bed Mobility: Rolling;Sidelying to Sit;Sit to Sidelying Rolling: Min assist Sidelying to sit: Min assist Sit to sidelying: Min assist General bed mobility  comments: cues and (A) for log rolling technique; (A) to elevate trunk to sitting position and (A) to lift legs onto bed at end of session Transfers Overall transfer level: Needs assistance Equipment used: None Transfers: Sit to/from Stand Sit to Stand: Min guard General transfer comment: cues to adhere to cervical preacutions; min guard to steady; incr time due to pain  Ambulation/Gait Ambulation/Gait assistance: Min guard Ambulation Distance (Feet): 3 Feet (lateral steps ) Assistive device: 1 person hand held assist Gait Pattern/deviations:  (lateral steps to [facility]) Gait velocity: decreased Gait velocity interpretation: Below normal speed for age/gender General Gait Details: pt took lateral steps to [facility] with handheld (A); cues for safety and sequencing         PT Diagnosis: Difficulty walking;Generalized weakness;Acute pain  PT Problem List: Decreased strength;Impaired sensation;Pain;Decreased knowledge of precautions;Decreased knowledge of use of DME;Decreased mobility;Decreased balance;Decreased activity tolerance PT Treatment Interventions: DME instruction;Gait training;Stair training;Functional mobility training;Therapeutic activities;Therapeutic exercise;Balance training;Neuromuscular re-education;Patient/family education     PT Goals(Current goals can be found in the care plan section) Acute Rehab PT Goals Patient Stated Goal: to get back to being independent  PT Goal Formulation: With patient Time For Goal Achievement: 09/03/13 Potential to Achieve Goals: Good  Visit Information  Last PT Received On: 08/27/13 Assistance Needed: +1 History of Present Illness: s/p anterior C5-C7 fusion       Prior [place] expects to be discharged to:: Private residence Living Arrangements: Alone Available Help at Discharge: Family;Available 24 hours/day Type of Home: Apartment Home Access: Level entry Home Layout: One level Home Equipment:  None Additional Comments: pt plans to D/C with mother and sister to  first story apartment; will have 24/7 (A)  Prior Function Level of Independence: Needs assistance Gait / Transfers Assistance Needed: pt was ambulating with SPC  ADL's / Homemaking Assistance Needed: reports her daughter had to help her at times with dressing  Communication Communication: No difficulties Dominant Hand: Right    Cognition  Cognition Arousal/Alertness: Awake/alert Behavior During Therapy: WFL for tasks assessed/performed Overall Cognitive Status: Within Functional Limits for tasks assessed    Extremity/Trunk Assessment Upper Extremity Assessment Upper Extremity Assessment: Defer to OT evaluation Lower Extremity Assessment Lower Extremity Assessment: RLE deficits/detail RLE Deficits / Details: decreased to light touch vs Lt LE; grossly 4-/5; denies any sharp shooting pains RLE Sensation: decreased light touch Cervical / Trunk Assessment Cervical / Trunk Assessment: Normal   Balance Balance Overall balance assessment: Needs assistance Sitting-balance support: Feet supported;Single extremity supported Sitting balance-Leahy Scale: Good Sitting balance - Comments: tolerated sitting EOB ~8 min to let dizziniess resolve  Standing balance support: Single extremity supported;During functional activity Standing balance-Leahy Scale: Fair Standing balance comment: Lt UE supported at EOB General Comments General comments (skin integrity, edema, etc.): no specific orders regarding wear schedule of ASPEN collar; collar placed on pt in supine position and encouraged RN to leave on her till notified otherwise by MD  End of Session PT - End of Session Equipment Utilized During Treatment: Gait belt Activity Tolerance: Patient tolerated treatment well Patient left: in bed;with call bell/phone within reach Nurse Communication: Mobility status;Precautions  GP Functional Assessment Tool Used: clinical judgement   Functional Limitation: Mobility: Walking and moving around Mobility: Walking and Moving Around Current Status (R6789): At least 1 percent but less than 20 percent impaired, limited or restricted Mobility: Walking and Moving Around Goal Status [phone]): 0 percent impaired, limited or restricted   Gustavus Bryant, [place] [phone] 08/27/2013, 5:47 PM

## 2013-08-27 NOTE — Brief Op Note (Signed)
08/27/2013  12:06 PM  PATIENT:  Brett Albino  48 y.o. female  PRE-OPERATIVE DIAGNOSIS:  CERVICAL STENOISIS WITH RADICULOPATHY  POST-OPERATIVE DIAGNOSIS:  CERVICAL STENOISIS WITH RADICULOPATHY  PROCEDURE:  Procedure(s): ANTERIOR CERVICAL /DISCECTOMY FUSION (ACDF) C5-C7  2 LEVELS (N/A)  SURGEON:  Surgeon(s) and Role:    * Melina Schools, MD - Primary  PHYSICIAN ASSISTANT:   ASSISTANTS: Benjiman Core   ANESTHESIA:   general  EBL:  Total I/O In: 1250 [I.V.:1000; IV Piggyback:250] Out: 100 [Blood:100]  BLOOD ADMINISTERED:none  DRAINS: none and Gastrostomy Tube   LOCAL MEDICATIONS USED:  MARCAINE     SPECIMEN:  No Specimen  DISPOSITION OF SPECIMEN:  N/A  COUNTS:  YES  TOURNIQUET:  * No tourniquets in log *  DICTATION: .Other Dictation: Dictation Number [id number]  PLAN OF CARE: Admit for overnight observation  PATIENT DISPOSITION:  PACU - hemodynamically stable.

## 2013-08-27 NOTE — Anesthesia Procedure Notes (Signed)
Procedure Name: Intubation Date/Time: 08/27/2013 8:45 AM Performed by: Blair Heys E Pre-anesthesia Checklist: Patient identified, Emergency Drugs available, Suction available and Patient being monitored Patient Re-evaluated:Patient Re-evaluated prior to inductionOxygen Delivery Method: Circle system utilized Preoxygenation: Pre-oxygenation with 100% oxygen Intubation Type: IV induction and Rapid sequence Ventilation: Mask ventilation without difficulty and Oral airway inserted - appropriate to patient size Laryngoscope Size: Sabra Heck and 2 Grade View: Grade I Tube type: Oral Tube size: 7.5 mm Number of attempts: 1 Airway Equipment and Method: Stylet,  Oral airway and LTA kit utilized Placement Confirmation: ETT inserted through vocal cords under direct vision,  positive ETCO2 and breath sounds checked- equal and bilateral Secured at: 22 cm Tube secured with: Tape Dental Injury: Teeth and Oropharynx as per pre-operative assessment

## 2013-08-27 NOTE — Anesthesia Preprocedure Evaluation (Addendum)
Anesthesia Evaluation  Patient identified by MRN, date of birth, ID band Patient awake    Reviewed: Allergy & Precautions, H&P , NPO status , Patient's Chart, lab work & pertinent test results  Airway Mallampati: II TM Distance: >3 FB Neck ROM: Full    Dental  (+) Dental Advisory Given, Teeth Intact,    Pulmonary asthma ,  breath sounds clear to auscultation        Cardiovascular hypertension, Rhythm:Regular Rate:Normal     Neuro/Psych  Headaches, PSYCHIATRIC DISORDERS Anxiety    GI/Hepatic GERD-  Medicated,  Endo/Other  diabetes, Oral Hypoglycemic AgentsMorbid obesity  Renal/GU      Musculoskeletal  (+) Arthritis -,   Abdominal   Peds  Hematology  (+) Sickle cell trait ,   Anesthesia Other Findings   Reproductive/Obstetrics                         Anesthesia Physical Anesthesia Plan  ASA: II  Anesthesia Plan: General   Post-op Pain Management:    Induction: Intravenous  Airway Management Planned: Oral ETT  Additional Equipment:   Intra-op Plan:   Post-operative Plan: Extubation in OR  Informed Consent: I have reviewed the patients History and Physical, chart, labs and discussed the procedure including the risks, benefits and alternatives for the proposed anesthesia with the patient or authorized representative who has indicated his/her understanding and acceptance.   Dental advisory given  Plan Discussed with: CRNA, Anesthesiologist and Surgeon  Anesthesia Plan Comments:         Anesthesia Quick Evaluation

## 2013-08-27 NOTE — Anesthesia Postprocedure Evaluation (Signed)
  Anesthesia Post-op Note  Patient: [name]  Procedure(s) Performed: Procedure(s): ANTERIOR CERVICAL /DISCECTOMY FUSION (ACDF) C5-C7  2 LEVELS (N/A)  Patient Location: PACU  Anesthesia Type:General  Level of Consciousness: awake  Airway and Oxygen Therapy: Patient Spontanous Breathing  Post-op Pain: mild  Post-op Assessment: Post-op Vital signs reviewed  Post-op Vital Signs: Reviewed  Complications: No apparent anesthesia complications

## 2013-08-28 LAB — GLUCOSE, CAPILLARY
Glucose-Capillary: 105 mg/dL — ABNORMAL HIGH (ref 70–99)
Glucose-Capillary: 123 mg/dL — ABNORMAL HIGH (ref 70–99)
Glucose-Capillary: 123 mg/dL — ABNORMAL HIGH (ref 70–99)
Glucose-Capillary: 124 mg/dL — ABNORMAL HIGH (ref 70–99)
Glucose-Capillary: 131 mg/dL — ABNORMAL HIGH (ref 70–99)
Glucose-Capillary: 141 mg/dL — ABNORMAL HIGH (ref 70–99)

## 2013-08-28 MED ORDER — ONDANSETRON 4 MG PO TBDP: 4.0000 mg | ORAL_TABLET | Freq: Three times a day (TID) | ORAL | Status: DC | PRN

## 2013-08-28 MED ORDER — OXYCODONE-ACETAMINOPHEN 5-325 MG PO TABS: 1.0000 | ORAL_TABLET | ORAL | Status: DC | PRN

## 2013-08-28 MED ORDER — METHOCARBAMOL 500 MG PO TABS: 500.0000 mg | ORAL_TABLET | Freq: Four times a day (QID) | ORAL | Status: DC | PRN

## 2013-08-28 MED ORDER — TRAMADOL HCL 50 MG PO TABS
50.0000 mg | ORAL_TABLET | Freq: Four times a day (QID) | ORAL | Status: DC | PRN
  Administered 2013-08-28 – 2013-08-29 (×5): 50 mg via ORAL
  Filled 2013-08-28 (×5): qty 1

## 2013-08-28 MED ORDER — POLYETHYLENE GLYCOL 3350 17 G PO PACK: 17.0000 g | PACK | Freq: Every day | ORAL | Status: DC

## 2013-08-28 MED ORDER — DOCUSATE SODIUM 100 MG PO CAPS: 100.0000 mg | ORAL_CAPSULE | Freq: Two times a day (BID) | ORAL | Status: DC
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
## 2013-08-28 NOTE — Progress Notes (Signed)
Patient alert/oriented x 4. Post ambulation with PT, patient discusses plan of care and progress. Anticipate d/c this afternoon. Appropriate medications and dosages reviewed with patient for anticipation of discharge. No signs or symptoms noted of adverse effects from analgesics.

## 2013-08-28 NOTE — Plan of Care (Signed)
Problem: Phase II Progression Outcomes Goal: Tolerating diet Outcome: Progressing Tolerating diet very well

## 2013-08-28 NOTE — Progress Notes (Addendum)
Subjective: Doing well.  Pain controlled.  preop right arm symptoms resolved.  Denies dysphagia, dyspnea.     Objective: Vital signs in last 24 hours: Temp:  [97.2 F (36.2 C)-99.2 F (37.3 C)] 99.2 F (37.3 C) (02/19 0537) Pulse Rate:  [89-117] 89 (02/19 0537) Resp:  [18-22] 18 (02/19 0537) BP: (128-163)/(80-99) 144/88 mmHg (02/19 0537) SpO2:  [94 %-100 %] 100 % (02/19 0537)  Intake/Output from previous day: 02/18 0701 - 02/19 0700 In: 2765 [P.O.:840; I.V.:1550; IV Piggyback:250] Out: 4970 [Urine:3350; Blood:100] Intake/Output this shift: Total I/O In: 240 [P.O.:240] Out: -   No results found for this basename: HGB,  in the last 72 hours No results found for this basename: WBC, RBC, HCT, PLT,  in the last 72 hours No results found for this basename: NA, K, CL, CO2, BUN, CREATININE, GLUCOSE, CALCIUM,  in the last 72 hours No results found for this basename: LABPT, INR,  in the last 72 hours  Exam:  Dressing c/d/i.  Neurologically intact throughout.  Alert and oriented.   Assessment/Plan: Anticipate d/c home today if she does well with PT.  Scripts on chart.  F/u dr Rolena Infante 2 weeks postop.     OWENS,JAMES M 08/28/2013, 8:20 AM     xrays satisfactory NVI Slow progression Dysphagia stable.  Tolerating liquids Due to slow progression will plan on d/c Friday

## 2013-08-28 NOTE — Op Note (Signed)
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  OPERATIVE PROCEDURE:  Anterior cervical diskectomy fusion, C5-6, C6-7.  INTRAOPERATIVE FINDINGS:  Calcified bone spur at C5-6 removed in its entirety with this size 8 large lordotic Titan intervertebral cage in place, packed with DBX mix with similar findings at C6-7.  The same graft cage were used with a 32 anterior cervical DePuy's guideline plate affixed with 14 mm screws into the bodies of C5 and C7, and 12 mm screws into C6.  No intraoperative complications.  FIRST ASSISTANT:  Benjiman Core, PA.  HISTORY:  This is a very pleasant woman who has been having severe neck and radicular right arm pain for sometime now.  Attempts at conservative management have failed to alleviate her symptoms and so we elected to proceed with surgery.  All appropriate risks, benefits, and alternatives of surgery were discussed with the patient and consent was obtained.  OPERATIVE NOTE:  The patient was brought to the operating room, placed supine on the operating table.  After successful induction of general anesthesia endotracheal intubation, TEDs and SCDs were applied.  The arms were secured at the side and her anterior cervical spine was prepped and draped in standard fashion.  A time-out was taken confirming patient, procedure, and all other pertinent important data.  Once this was completed, we used x-ray and the lateral fluoro view to identify the C6 vertebral body.  Due to her body habitus, this was difficult but I was able to determine  where it was.  I then infiltrated the proposed incision site and made a transverse incision starting at the midline proceeding to the left.  Sharp dissection was carried out down to the deep fascia.  The platysma was identified and sharply incised.  I then identified the sternocleidomastoid and began dissecting sharply in the deep cervical fascia along the medial border of this.  I then identified the omohyoid muscle and sacrificed it.  This allowed for excellent visualization.  I then continued my dissection through the deep cervical and prevertebral fascia.  I swept the trachea and esophagus to the right and protected it with a thyroid retractor and then identified the carotid sheath and protected that with my finger.  I then could palpate and visualize the anterior longitudinal ligament.  A needle was placed into the 5-6 disk space, and I took an intraoperative x-ray and confirmed that I was at the appropriate level.  The Bovie was then used to mark the C5-6 and C6-7 disks.  Once I had identified my levels, I then used bipolar electrocautery to mobilize the sternocleidomastoid muscles from the midbody of C5 to the midbody of C7.  Self-retaining retractors were placed underneath the longus colli muscles.  The endotracheal cuff was deflated and expanded the retractor to  the appropriate width.  A #15 blade scalpel used to perform an annulotomy at the C6-7 level. Using a pituitary rongeur, I removed the bulk of the disk material.  I then used a 2-mm and 3-mm Kerrison to remove the overhanging osteophyte from the inferior aspect of C6.  This allowed for excellent visualization.  I then placed distraction pins into the bodies of 6 and 7, distracted the intervertebral space and then maintained the distraction with the distraction pins.  I then continued my dissection posteriorly using fine nerve curettes removing the disk.  I then developed a plane underneath the annulus and used my 1-mm  Kerrison to resect the annulus.  At this point, I was able to mobilize with a nerve hook, fragmented disk material in the mid to right side consistent with the preoperative MRI. Once this was removed, I used this and exploited this defect and created a space between the posterior longitudinal ligament in the thecal sac. I then used an 1-mm Kerrison to resect the posterior longitudinal ligament.  At this point, I had adequate decompression.  I then went because of the significant right arm pain underneath the uncovertebral joint on the right side, and debrided this.  At this point with an adequate diskectomy performed, I scraped the endplates to ensure I had bleeding subchondral bone and removed the cartilaginous portion of it. I then trialed with various device sizes and elected to use the 8 large Titan titanium intervertebral spacer.  This was packed with DBX.  It was malleted to an acceptable depth and was well fitted.  At this point, I then removed the distraction pin from C7 and plugged the hole with bone wax and then repositioned the retractors to expose the 5-6 disk space. I also placed the distraction pin at C5.  Using the same technique, I performed an annulotomy, removed the bulk of the disk, and then distracted the space.  Once I was down posteriorly, I noted that this level was more hard osteophytic bone ridge that was causing her stenosis.  I then again removed the posterior longitudinal ligament and very gently began dissecting underneath this bone spur to separate it from the thecal sac.  I was then able to remove the bone spur and alleviate the compression.  I again performed a decompression.  I also removed the undersurface bone spurs from the uncovertebral joint.  I then swept underneath the C6 vertebral body, and removed the another small fragment of disk material consisting what was seen on the preoperative MRI.  I then rasped the endplates and placed the same  size spacer at this level.  I had 2 excellent interpositional grafts.  At this point, I contoured an anterior cervical plate and removed the distraction pins and placed the plate and secured it with self-drilling screws.  The wound was copiously irrigated and then I checked to ensure the esophagus, did not become entrap beneath the plate during its application.  Once I confirmed the trachea and esophagus were free, I then returned them to the midline, irrigated copiously with normal saline and made sure I had hemostasis.  Once I confirmed hemostasis, I then closed the platysma with interrupted 2-0 Vicryl sutures, and a 3-0 Monocryl for the skin.  Steri-Strips and dry dressing were applied as was a collar.  The patient was ultimately extubated and transferred to PACU without incident.  At the end of the case, all needle and sponge counts were correct.  First assistant was Benjiman Core is instrumental  in assisting was suctioned, retraction, visualization, and wound closure.     Dahlia Bailiff, MD     DDB/MEDQ  D:  08/27/2013  T:  08/28/2013  Job:  [id number]

## 2013-08-28 NOTE — Progress Notes (Signed)
CSW consulted for SNF placement; PT recommends no follow up. CSW signing off.   Ky Barban, MSW, [facility] Clinical Social Worker [phone]

## 2013-08-28 NOTE — Discharge Instructions (Signed)

## 2013-08-28 NOTE — Progress Notes (Signed)
Physical Therapy Treatment Patient Details Name: [name] MRN: [id number] DOB: [birth year] Today's Date: 08/28/2013 Time: 1601-0932 PT Time Calculation (min): 23 min  PT Assessment / Plan / Recommendation  History of Present Illness s/p anterior C5-C7 fusion   PT Comments   Pt. Presents this am with lethargy/sedation which has reduced her safety in walking, suspect due to pain medication.  Discussed this with RN [name] and with Jeneen Rinks PA.  She did stabilize with RW but moving slowly and needing frequent vc's for managing RW and to keep moving ahead.  When she clears out from under the sedating effect of her meds, I suspect she will be ready to DC home.  Will make every attempt to see her again prior to her possible DC today, depending on caseload.  Follow Up Recommendations  No PT follow up;Supervision/Assistance - 24 hour     Does the patient have the potential to tolerate intense rehabilitation     Barriers to Discharge        Equipment Recommendations  Rolling walker with 5" wheels;3in1 (PT)    Recommendations for Other Services    Frequency Min 5X/week   Progress towards PT Goals Progress towards PT goals: Progressing toward goals  Plan Current plan remains appropriate    Precautions / Restrictions Precautions Precautions: Fall;Cervical Precaution Comments: reveiwed log rolling technique verbally with pt. for getting oob; pt. voices understanding.  Required Braces or Orthoses: Cervical Brace Cervical Brace: Hard collar;At all times Restrictions Weight Bearing Restrictions: No   Pertinent Vitals/Pain See vitals tab Pain appears well managed and is not inhibiting her gait; sedation is inhibiting her mobility    Mobility  Bed Mobility Overal bed mobility:  (not tested, pt up ion recliner) Transfers Overall transfer level: Needs assistance Equipment used: Rolling walker (2 wheeled) Transfers: Sit to/from Stand Sit to Stand: Supervision General transfer comment:  verbal cues for hand placement/pushing up from and reaching for chair; supervision for safety due to pt's overall sedated demeanor Ambulation/Gait Ambulation/Gait assistance: Min guard Ambulation Distance (Feet): 125 Feet Assistive device: Rolling walker (2 wheeled) Gait Pattern/deviations: Step-through pattern;Decreased stride length Gait velocity: decreased Gait velocity interpretation: Below normal speed for age/gender General Gait Details: pt. overall appears sedated likely from pain med given around 5 am per Rn.  SHe was steady with her gait and with no overt LOB but needed cueing and coaching to keep moving forward and to advance RW.  Pt. needed the added stability of the RW this am, so will put in this recommendation for home RW.    Exercises     PT Diagnosis:    PT Problem List:   PT Treatment Interventions:     PT Goals (current goals can now be found in the care plan section)    Visit Information  Last PT Received On: 08/28/13 Assistance Needed: +1 History of Present Illness: s/p anterior C5-C7 fusion    Subjective Data  Subjective: Pt. reports she will be going home with her mom and has no steps to enter at mom's home   Cognition  Cognition Arousal/Alertness: Lethargic;Suspect due to medications Behavior During Therapy: Flat affect Overall Cognitive Status: Within Functional Limits for tasks assessed    Balance  Balance Overall balance assessment: Needs assistance Standing balance support: Bilateral upper extremity supported;During functional activity Standing balance-Leahy Scale: Fair  End of Session PT - End of Session Equipment Utilized During Treatment: Gait belt;Cervical collar Activity Tolerance: Patient tolerated treatment well;Patient limited by lethargy (suspect due to pain meds)  Patient left: in chair;with call bell/phone within reach Nurse Communication: Mobility status;Other (comment) (RN [place] and [place] Jeneen Rinks made aware of pt's sedation )   GP      [name] 08/28/2013, 8:48 AM [name] PT Acute Rehab Services [phone] [place] [phone]

## 2013-08-28 NOTE — Evaluation (Signed)
Occupational Therapy Evaluation and Discharge Patient Details Name: [name] MRN: [id number] DOB: 09-04-1965 Today's Date: 08/28/2013 Time: 4166-0630 OT Time Calculation (min): 38 min  OT Assessment / Plan / Recommendation History of present illness s/p anterior C5-C7 fusion   Clinical Impression   This 48 yo female admitted and underwent above presents to acute OT with all education completed, will D/C from acute OT.    OT Assessment  Patient does not need any further OT services    Follow Up Recommendations  No OT follow up       Equipment Recommendations  None recommended by OT          Precautions / Restrictions Precautions Precautions: Fall;Cervical Precaution Comments: reveiwed log rolling technique verbally with pt. for getting oob; pt. voices understanding.  Required Braces or Orthoses: Cervical Brace Cervical Brace: Hard collar;At all times Restrictions Weight Bearing Restrictions: No       ADL  Eating/Feeding: Set up Where Assessed - Eating/Feeding: Chair Grooming: Set up Where Assessed - Grooming: Supported sitting Upper Body Bathing: Minimal assistance Where Assessed - Upper Body Bathing: Supported sitting Lower Body Bathing: Set up;Supervision/safety (with AE) Where Assessed - Lower Body Bathing: Unsupported sit to stand Upper Body Dressing: Moderate assistance Where Assessed - Upper Body Dressing: Supported sitting Lower Body Dressing: Supervision/safety;Set up (with AE) Where Assessed - Lower Body Dressing: Unsupported sit to stand Toilet Transfer:  (Per pt she has been getting up to BR with staff without A) Equipment Used:  (c-collar) Transfers/Ambulation Related to ADLs: S for sit<>stand with RW ADL Comments: Recommended to pt to have her family look for a tub seat for her to use at home since insurance does not pay for tub equipement--perhaps could find one at [employer] or [employer]. Demonstrated to her with her collar how to change out  the pads and clean them as well as how to adjust the collar vertically and horizontally     Acute Rehab OT Goals Patient Stated Goal: I think my right arm will get back to normal soon, it already feels better than it did, just weak.  Visit Information  Last OT Received On: 08/28/13 Assistance Needed: +1 History of Present Illness: s/p anterior C5-C7 fusion       Prior [place] expects to be discharged to:: Private residence Living Arrangements: Alone Available Help at Discharge: Family;Available 24 hours/day Type of Home: Apartment Home Access: Level entry Home Layout: One level Home Equipment: None Additional Comments: pt plans to D/C with mother and sister to first story apartment; will have 24/7 (A)  Prior Function Level of Independence: Needs assistance Gait / Transfers Assistance Needed: pt was ambulating with SPC  ADL's / Homemaking Assistance Needed: reports her daughter had to help her at times with dressing  Communication Communication: No difficulties Dominant Hand: Right         Vision/Perception Vision - History Patient Visual Report: No change from baseline   Cognition  Cognition Arousal/Alertness: Awake/alert Behavior During Therapy: WFL for tasks assessed/performed Overall Cognitive Status: Within Functional Limits for tasks assessed    Extremity/Trunk Assessment Upper Extremity Assessment Upper Extremity Assessment: Generalized weakness RUE Coordination: decreased fine motor;decreased gross motor        Exercise Other Exercises Other Exercises: Gave pt a handout for RUE exercises elbow distally using a soup can for weight, as well as therputty exercises and issued her yellow theraputty and a squeeze ball.      End of  Session OT - End of Session Activity Tolerance: Patient tolerated treatment well Patient left: in chair;with call bell/phone within reach  GO Functional Assessment Tool Used: Clinical  observation Functional Limitation: Self care Self Care Current Status (T4656): At least 40 percent but less than 60 percent impaired, limited or restricted Self Care Goal Status (C1275): At least 40 percent but less than 60 percent impaired, limited or restricted Self Care Discharge Status [phone]): At least 40 percent but less than 60 percent impaired, limited or restricted   Almon Register [phone] 08/28/2013, 10:55 AM

## 2013-08-29 ENCOUNTER — Encounter (HOSPITAL_COMMUNITY): Payer: Self-pay | Admitting: Orthopedic Surgery

## 2013-08-29 LAB — GLUCOSE, CAPILLARY
Glucose-Capillary: 101 mg/dL — ABNORMAL HIGH (ref 70–99)
Glucose-Capillary: 112 mg/dL — ABNORMAL HIGH (ref 70–99)
Glucose-Capillary: 120 mg/dL — ABNORMAL HIGH (ref 70–99)
Glucose-Capillary: 122 mg/dL — ABNORMAL HIGH (ref 70–99)

## 2013-08-29 MED ORDER — TRAMADOL HCL 50 MG PO TABS: 50.0000 mg | ORAL_TABLET | Freq: Four times a day (QID) | ORAL | Status: DC | PRN

## 2013-08-29 NOTE — Progress Notes (Signed)
Patient provided with discharge instructions and follow up information. She was provided with rolling walker for home DME. She is going home at this time with her daughter.

## 2013-08-29 NOTE — Care Management Note (Signed)
CARE MANAGEMENT NOTE 08/29/2013  Patient:  [name], [name]   Account Number:  [id number]  Date Initiated:  08/29/2013  Documentation initiated by:  Ricki Miller  Subjective/Objective Assessment:   48 yr old female s/p C5-7 ACDF     Action/Plan:   No HH needs only DME   Anticipated DC Date:  08/29/2013   Anticipated DC Plan:  [place]  CM consult      PAC Choice  DURABLE MEDICAL EQUIPMENT   Choice offered to / List presented to:     DME arranged  [name]      DME agency  [place] arranged  NA      Status of service:  Completed, signed off Medicare Important Message given?   (If response is "NO", the following Medicare IM given date fields will be blank) Date Medicare IM given:   Date Additional Medicare IM given:    Discharge Disposition:  HOME/SELF CARE

## 2013-08-29 NOTE — Progress Notes (Signed)
Physical Therapy Treatment Patient Details Name: [name] MRN: [id number] DOB: 16-Jan-1966 Today's Date: 08/29/2013 Time: 5427-0623 PT Time Calculation (min): 26 min  PT Assessment / Plan / Recommendation  History of Present Illness s/p anterior C5-C7 fusion   PT Comments   Pt. Continues with flat affect but is not sedated looking this am.  She is still moving/ambulating quite slowly but did not have any overt LOB .  Was able to walk 200' today though it took increased time.  Feel she is ready for DC home from PT standpoint and she states she will definitely have 24 hour assist.  Recommend RW at time of DC.  Follow Up Recommendations  No PT follow up;Supervision/Assistance - 24 hour     Does the patient have the potential to tolerate intense rehabilitation     Barriers to Discharge        Equipment Recommendations  Rolling walker with 5" wheels;3in1 (PT)    Recommendations for Other Services    Frequency Min 5X/week   Progress towards PT Goals Progress towards PT goals: Progressing toward goals  Plan Current plan remains appropriate    Precautions / Restrictions Precautions Precautions: Fall;Cervical Required Braces or Orthoses: Cervical Brace Cervical Brace: Hard collar;At all times Restrictions Weight Bearing Restrictions: No   Pertinent Vitals/Pain See vitals tab     Mobility  Bed Mobility Overal bed mobility:  (pt. up in recliner , not tested) Transfers Overall transfer level: Modified independent Equipment used: Rolling walker (2 wheeled) Transfers: Sit to/from Stand Sit to Stand: Modified independent (Device/Increase time) General transfer comment: pt. usisng correct hand placement and was at mod I level for sit<>stand from recliner and from toilet.  She did use grab bar in bathroom to assist up to stand. Ambulation/Gait Ambulation/Gait assistance: Min guard;Supervision Ambulation Distance (Feet): 200 Feet Assistive device: Rolling walker (2 wheeled) Gait  Pattern/deviations: Step-to pattern Gait velocity: decreased Gait velocity interpretation: Below normal speed for age/gender General Gait Details: Pt. continues to move quite slowly but did not have overt LOB and did not feel like her legs would buckle (as they did yesterday).  Using Rw correctly and managing it independently.      Exercises     PT Diagnosis:    PT Problem List:   PT Treatment Interventions:     PT Goals (current goals can now be found in the care plan section)    Visit Information  Last PT Received On: 08/29/13 Assistance Needed: +1 History of Present Illness: s/p anterior C5-C7 fusion    Subjective Data  Subjective: Pt. says she feels a little better today   Cognition  Cognition Arousal/Alertness: Awake/alert Behavior During Therapy: WFL for tasks assessed/performed Overall Cognitive Status: Within Functional Limits for tasks assessed    Balance  Balance Standing balance support: No upper extremity supported;During functional activity Standing balance-Leahy Scale: Fair Standing balance comment: Pt. able to rise to stand from toilet with initial use of left grab bar then could stand briefly without support from RW or external support to pull gown down and get ready to walk.    End of Session PT - End of Session Equipment Utilized During Treatment: Gait belt;Cervical collar Activity Tolerance: Patient tolerated treatment well Patient left: in chair;with call bell/phone within reach Nurse Communication: Mobility status   GP Mobility: Walking and Moving Around Current Status ([id number]): At least 1 percent but less than 20 percent impaired, limited or restricted Mobility: Walking and Moving Around Goal Status [phone]): 0 percent impaired, limited  or restricted   [name] 08/29/2013, 8:55 AM [name] PT Acute Rehab Services [phone] [place] [phone]

## 2013-08-29 NOTE — Progress Notes (Signed)
    Subjective: Procedure(s) (LRB): ANTERIOR CERVICAL /DISCECTOMY FUSION (ACDF) C5-C7  2 LEVELS (N/A) 2 Days Post-Op  Patient reports pain as 2 on 0-10 scale.  Reports decreased arm pain reports incisional neck pain   Positive void Positive bowel movement Positive flatus Negative chest pain or shortness of breath  Objective: Vital signs in last 24 hours: Temp:  [98 F (36.7 C)-98.7 F (37.1 C)] 98 F (36.7 C) (02/20 0415) Pulse Rate:  [91-100] 98 (02/20 0415) Resp:  [16] 16 (02/20 0415) BP: (137-169)/(87-95) 169/93 mmHg (02/20 0415) SpO2:  [98 %-100 %] 98 % (02/20 0415)  Intake/Output from previous day: 02/19 0701 - 02/20 0700 In: 940 [P.O.:840; IV Piggyback:100] Out: -   Labs: No results found for this basename: WBC, RBC, HCT, PLT,  in the last 72 hours No results found for this basename: NA, K, CL, CO2, BUN, CREATININE, GLUCOSE, CALCIUM,  in the last 72 hours No results found for this basename: LABPT, INR,  in the last 72 hours  Physical Exam: Neurologically intact ABD soft Intact pulses distally Incision: dressing C/D/I Compartment soft  Assessment/Plan: Patient stable  xrays satisfactory Mobilization with physical therapy Encourage incentive spirometry Continue care  Advance diet Up with therapy D/C IV fluids Plan on d/c to home today  Melina Schools, MD [place] [phone]

## 2013-09-09 ENCOUNTER — Ambulatory Visit (HOSPITAL_COMMUNITY)
Admission: RE | Admit: 2013-09-09 | Discharge: 2013-09-09 | Disposition: A | Discharge status: Home / Self Care | Payer: BC Managed Care – PPO | Source: Ambulatory Visit | Attending: Cardiovascular Disease | Admitting: Cardiovascular Disease

## 2013-09-09 ENCOUNTER — Other Ambulatory Visit: Payer: Self-pay | Admitting: *Deleted

## 2013-09-09 DIAGNOSIS — R600 Localized edema: Secondary | ICD-10-CM

## 2013-09-09 DIAGNOSIS — R609 Edema, unspecified: Secondary | ICD-10-CM | POA: Insufficient documentation

## 2013-09-09 NOTE — Progress Notes (Signed)
Lower Extremity Venous Duplex Completed. No evidence for DVT or SVT. °Brianna L Mazza,RVT °

## 2013-09-18 ENCOUNTER — Encounter: Payer: Self-pay | Admitting: Internal Medicine

## 2013-09-18 ENCOUNTER — Ambulatory Visit (INDEPENDENT_AMBULATORY_CARE_PROVIDER_SITE_OTHER): Payer: BC Managed Care – PPO | Admitting: Internal Medicine

## 2013-09-18 VITALS — BP 130/82 | HR 89 | Temp 98.2°F | Wt 306.0 lb

## 2013-09-18 DIAGNOSIS — F411 Generalized anxiety disorder: Secondary | ICD-10-CM

## 2013-09-18 DIAGNOSIS — D649 Anemia, unspecified: Secondary | ICD-10-CM

## 2013-09-18 LAB — VITAMIN B12: Vitamin B-12: 359 pg/mL (ref 211–911)

## 2013-09-18 LAB — FOLATE: Folate: 10.5 ng/mL (ref >5.9)

## 2013-09-18 NOTE — Patient Instructions (Signed)
Get your blood work before you leave   Next visit is for routine check up regards your blood sugar  Anxiety  in 4 months  No need to come back fasting Please make an appointment
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
## 2013-09-18 NOTE — Assessment & Plan Note (Signed)
Mild anemia   stable per last CBC, Iron was normal. B12 was found to be low, related to metformin?. Currently taking B12 500 mg daily, recommend to increase  To 1000 mg a day, check a B12 level today. Consider parenteral therapy

## 2013-09-18 NOTE — Assessment & Plan Note (Signed)
Better with the increase of Prozac, no change. Patient is supported and counseled today

## 2013-09-18 NOTE — Progress Notes (Signed)
Pre visit review using our clinic review tool, if applicable. No additional management support is needed unless otherwise documented below in the visit note. 

## 2014-01-20 ENCOUNTER — Ambulatory Visit: Payer: BC Managed Care – PPO | Admitting: Internal Medicine

## 2014-03-09 ENCOUNTER — Telehealth: Payer: Self-pay

## 2014-03-09 NOTE — Telephone Encounter (Signed)
LVM for pt to call back and rescheduled missed/cancelled appointment.   A1C is needed.   DM bundle pt.

## 2014-04-23 ENCOUNTER — Other Ambulatory Visit (HOSPITAL_COMMUNITY): Payer: Self-pay | Admitting: Orthopedic Surgery

## 2014-04-23 DIAGNOSIS — M5440 Lumbago with sciatica, unspecified side: Secondary | ICD-10-CM

## 2014-04-30 ENCOUNTER — Ambulatory Visit (HOSPITAL_COMMUNITY)
Admission: RE | Admit: 2014-04-30 | Discharge: 2014-04-30 | Disposition: A | Discharge status: Home / Self Care | Payer: BC Managed Care – PPO | Source: Ambulatory Visit | Attending: Orthopedic Surgery | Admitting: Orthopedic Surgery

## 2014-04-30 ENCOUNTER — Ambulatory Visit (HOSPITAL_COMMUNITY)
Admission: RE | Admit: 2014-04-30 | Discharge: 2014-04-30 | Disposition: A | Discharge status: Home / Self Care | Payer: BC Managed Care – PPO | Source: Ambulatory Visit

## 2014-04-30 DIAGNOSIS — M5440 Lumbago with sciatica, unspecified side: Secondary | ICD-10-CM

## 2014-05-01 ENCOUNTER — Other Ambulatory Visit (HOSPITAL_COMMUNITY): Payer: Self-pay | Admitting: Orthopedic Surgery

## 2014-05-01 DIAGNOSIS — M5440 Lumbago with sciatica, unspecified side: Secondary | ICD-10-CM

## 2014-05-05 ENCOUNTER — Other Ambulatory Visit (HOSPITAL_COMMUNITY): Payer: Self-pay | Admitting: Orthopedic Surgery

## 2014-05-05 DIAGNOSIS — M5440 Lumbago with sciatica, unspecified side: Secondary | ICD-10-CM

## 2014-05-06 ENCOUNTER — Ambulatory Visit (HOSPITAL_COMMUNITY)
Admission: RE | Admit: 2014-05-06 | Discharge: 2014-05-06 | Disposition: A | Discharge status: Home / Self Care | Payer: BC Managed Care – PPO | Source: Ambulatory Visit | Attending: Orthopedic Surgery | Admitting: Orthopedic Surgery

## 2014-05-06 DIAGNOSIS — M8938 Hypertrophy of bone, other site: Secondary | ICD-10-CM | POA: Insufficient documentation

## 2014-05-06 DIAGNOSIS — M4806 Spinal stenosis, lumbar region: Secondary | ICD-10-CM | POA: Insufficient documentation

## 2014-05-06 DIAGNOSIS — M5440 Lumbago with sciatica, unspecified side: Secondary | ICD-10-CM

## 2014-05-06 DIAGNOSIS — M5126 Other intervertebral disc displacement, lumbar region: Secondary | ICD-10-CM | POA: Insufficient documentation

## 2014-05-19 NOTE — Telephone Encounter (Signed)
Error

## 2014-06-10 ENCOUNTER — Ambulatory Visit: Payer: BC Managed Care – PPO | Admitting: Internal Medicine

## 2014-06-18 ENCOUNTER — Encounter (HOSPITAL_COMMUNITY): Payer: Self-pay | Admitting: Cardiology

## 2014-07-10 HISTORY — PX: REDUCTION MAMMAPLASTY: SUR839

## 2014-07-13 ENCOUNTER — Encounter: Payer: Self-pay | Admitting: Internal Medicine

## 2014-07-13 ENCOUNTER — Ambulatory Visit (INDEPENDENT_AMBULATORY_CARE_PROVIDER_SITE_OTHER): Payer: Managed Care, Other (non HMO)

## 2014-07-13 ENCOUNTER — Ambulatory Visit (INDEPENDENT_AMBULATORY_CARE_PROVIDER_SITE_OTHER): Payer: PRIVATE HEALTH INSURANCE | Admitting: Internal Medicine

## 2014-07-13 VITALS — BP 128/66 | HR 78 | Temp 97.6°F | Ht 66.0 in | Wt 320.4 lb

## 2014-07-13 DIAGNOSIS — E119 Type 2 diabetes mellitus without complications: Secondary | ICD-10-CM

## 2014-07-13 DIAGNOSIS — I10 Essential (primary) hypertension: Secondary | ICD-10-CM

## 2014-07-13 DIAGNOSIS — G43809 Other migraine, not intractable, without status migrainosus: Secondary | ICD-10-CM

## 2014-07-13 DIAGNOSIS — F411 Generalized anxiety disorder: Secondary | ICD-10-CM

## 2014-07-13 DIAGNOSIS — Z23 Encounter for immunization: Secondary | ICD-10-CM

## 2014-07-13 MED ORDER — TOPIRAMATE 50 MG PO TABS: 50.0000 mg | ORAL_TABLET | Freq: Every day | ORAL | Status: DC

## 2014-07-13 MED ORDER — METHOCARBAMOL 500 MG PO TABS: 500.0000 mg | ORAL_TABLET | Freq: Four times a day (QID) | ORAL | Status: DC | PRN

## 2014-07-13 MED ORDER — ALPRAZOLAM 0.25 MG PO TABS: 0.2500 mg | ORAL_TABLET | Freq: Two times a day (BID) | ORAL | Status: DC | PRN

## 2014-07-13 MED ORDER — METFORMIN HCL 500 MG PO TABS: 500.0000 mg | ORAL_TABLET | Freq: Two times a day (BID) | ORAL | Status: DC

## 2014-07-13 NOTE — Assessment & Plan Note (Addendum)
Not an issue at this time, self d/c prozac, on Xanax which she takes rarely

## 2014-07-13 NOTE — Progress Notes (Signed)
Subjective:    Patient ID: [name], female    DOB: May 31, 1966, 49 y.o.   MRN: [id number]  DOS:  07/13/2014 Type of visit - description : rov Interval history: DM-- on metformin, no apparent s/e , no amb CBGs Anxiety-- self d/c prozac, on xanax rarely Migraines-- well controlled on topamax, requests a RF Also requests paperwork from the DMV  ROS Denies chest pain or difficulty breathing No nausea, vomiting, blood in the stools or abdominal pain but did have some diarrhea today. Has gained 14 pounds, see assessment and plan  Past Medical History  Diagnosis Date  . Hypertension   . Obesity   . GERD (gastroesophageal reflux disease)   . Anxiety state, unspecified 05/16/2013  . Sickle cell trait   . Asthma     "only when I get a sinus infection which is 1-2 X/yr" (08/27/2013)  . Diabetes     "borderline" (08/27/2013)  . Migraine     "once or twice/month now" (08/27/2013)  . Arthritis     "right knee" (08/27/2013)    Past Surgical History  Procedure Laterality Date  . Shoulder arthroscopy w/ rotator cuff repair Right 2006, 2007  . Umbilical hernia repair  2001, 2003  . Wisdom tooth extraction  1985  . Anterior cervical decomp/discectomy fusion  08/27/2013  . Cardiac catheterization  04/2013  . Cholecystectomy  06/1989  . Hernia repair    . Knee arthroscopy Left 08/2003  . Tubal ligation  1991  . Anterior cervical decomp/discectomy fusion N/A 08/27/2013    Procedure: ANTERIOR CERVICAL /DISCECTOMY FUSION (ACDF) C5-C7  2 LEVELS;  Surgeon: Melina Schools, MD;  Location: [facility];  Service: Orthopedics;  Laterality: N/A;  . Left heart catheterization with coronary angiogram N/A 04/15/2013    Procedure: LEFT HEART CATHETERIZATION WITH CORONARY ANGIOGRAM;  Surgeon: Peter M Martinique, MD;  Location: [facility] CATH LAB;  Service: Cardiovascular;  Laterality: N/A;    History   Social History  . Marital Status: Single    Spouse Name: N/A    Number of Children: 2  . Years of Education: N/A    Occupational History  . applied for disability    Social History Main Topics  . Smoking status: Never Smoker   . Smokeless tobacco: Never Used  . Alcohol Use: No  . Drug Use: No  . Sexual Activity: Not Currently    Birth Control/ Protection: None   Other Topics Concern  . Not on file   Social History Narrative   Lives in [place] by herself             Medication List       This list is accurate as of: 07/13/14 11:59 PM.  Always use your most recent med list.               ALPRAZolam 0.25 MG tablet  Commonly known as:  XANAX  Take 1 tablet (0.25 mg total) by mouth 2 (two) times daily as needed for anxiety.     esomeprazole 40 MG capsule  Commonly known as:  NEXIUM  Take 40 mg by mouth daily.     fexofenadine 180 MG tablet  Commonly known as:  ALLEGRA  Take 180 mg by mouth daily.     metFORMIN 500 MG tablet  Commonly known as:  GLUCOPHAGE  Take 1 tablet (500 mg total) by mouth 2 (two) times daily with a meal.     methocarbamol 500 MG tablet  Commonly known as:  ROBAXIN  Take 1 tablet (500 mg total) by mouth every 6 (six) hours as needed for muscle spasms.     oxyCODONE-acetaminophen 10-325 MG per tablet  Commonly known as:  PERCOCET  Take 1 tablet by mouth every 4 (four) hours as needed for pain.     topiramate 50 MG tablet  Commonly known as:  TOPAMAX  Take 1 tablet (50 mg total) by mouth at bedtime.     vitamin B-12 500 MCG tablet  Commonly known as:  CYANOCOBALAMIN  Take 1,000 mcg by mouth daily.           Objective:   Physical Exam BP 128/66 mmHg  Pulse 78  Temp(Src) 97.6 F (36.4 C) (Oral)  Ht 5\' 6"  (1.676 m)  Wt 320 lb 6 oz (145.321 kg)  BMI 51.73 kg/m2  SpO2 99%  LMP 06/23/2014  General -- alert, well-developed, NAD.   Lungs -- normal respiratory effort, no intercostal retractions, no accessory muscle use, and normal breath sounds.  Heart-- normal rate, regular rhythm, no murmur.  DIABETIC FEET EXAM: No lower extremity edema Normal  pedal pulses bilaterally Skin normal, nails normal, no calluses Pinprick examination of the feet normal. Neurologic--  alert & oriented X3. Speech normal, gait appropriate for age, strength symmetric and appropriate for age.  Psych-- Cognition and judgment appear intact. Cooperative with normal attention span and concentration. No anxious or depressed appearing.     Assessment & Plan:

## 2014-07-13 NOTE — Assessment & Plan Note (Signed)
Well-controlled on topamax, refill sent

## 2014-07-13 NOTE — Patient Instructions (Signed)
Get your blood work before you leave    Two great books to learn about diabetes Diabetes for Dummies "The [facility] Diabetes diet" book   All about diabetes, great resource!  [email or web address].za.[email or web address]    Please come back to the office in 3-4 months  for a physical exam. Come back fasting

## 2014-07-13 NOTE — Assessment & Plan Note (Signed)
Currently on metformin, not ambulatory CBGs, no apparent side effects. We discussed the importance of diet and exercise in addition to take medication. She is working and see a Engineer, maintenance (IT) however I also suggest self learning, see instructions. Check a A1c, continue metformin

## 2014-07-13 NOTE — Progress Notes (Signed)
Pre visit review using our clinic review tool, if applicable. No additional management support is needed unless otherwise documented below in the visit note. 

## 2014-07-14 LAB — BASIC METABOLIC PANEL
BUN: 6 mg/dL (ref 6–23)
CO2: 23 mEq/L (ref 19–32)
Calcium: 9.1 mg/dL (ref 8.4–10.5)
Chloride: 102 mEq/L (ref 96–112)
Creatinine, Ser: 0.6 mg/dL (ref 0.4–1.2)
GFR: 132.19 mL/min (ref >60.00)
Glucose, Bld: 189 mg/dL — ABNORMAL HIGH (ref 70–99)
Potassium: 3.6 mEq/L (ref 3.5–5.1)
Sodium: 133 mEq/L — ABNORMAL LOW (ref 135–145)

## 2014-07-14 LAB — HEMOGLOBIN A1C: Hgb A1c MFr Bld: 11.3 % — ABNORMAL HIGH (ref 4.6–6.5)

## 2014-07-17 ENCOUNTER — Telehealth: Payer: Self-pay | Admitting: Internal Medicine

## 2014-07-17 ENCOUNTER — Telehealth: Payer: Self-pay | Admitting: *Deleted

## 2014-07-17 ENCOUNTER — Telehealth: Payer: Self-pay

## 2014-07-17 NOTE — Telephone Encounter (Signed)
Caller name: Deaun, Rocha Relation to pt: self  Call back number: [phone]   Reason for call:  Pt was seen 07/13/2014 and checking on the status of her paperwork from [place]. 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

## 2014-07-17 NOTE — Telephone Encounter (Signed)
LMOM informing Pt to return call regarding lab results. Blood sugar and A1c extremely high, Dr. Larose Kells would like for her to be seen to discuss medication options sometime next week. Overbook okay.

## 2014-07-17 NOTE — Telephone Encounter (Signed)
Paperwork is currently with Dr. Larose Kells. JG//CMA

## 2014-07-17 NOTE — Telephone Encounter (Signed)
Opened in error. JG//CMA 

## 2014-07-20 LAB — HEMOGLOBIN A1C: Hgb A1c MFr Bld: 6.8 % — AB (ref 4.0–6.0)

## 2014-07-20 LAB — HM DIABETES EYE EXAM

## 2014-07-20 NOTE — Telephone Encounter (Signed)
Patient requesting status. Best # [phone]

## 2014-07-21 NOTE — Telephone Encounter (Signed)
LMOM informing Pt that paperwork is ready for pick up at front desk.

## 2014-07-21 NOTE — Telephone Encounter (Signed)
Please call [phone]

## 2014-07-21 NOTE — Telephone Encounter (Signed)
Pt is requesting to pick up paperwork TODAY (07/21/2014), she must have paperwork turned into her employer by 07/23/2014 or she will lose her job.

## 2014-07-21 NOTE — Telephone Encounter (Signed)
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

## 2014-07-21 NOTE — Telephone Encounter (Signed)
Pt has appt for Friday, and would like for you to call her today when the paperwork is ready for pick up, states she has to take it to her eye doctor as well so she will come get it,.

## 2014-07-23 NOTE — Telephone Encounter (Signed)
[name] had Dr. Larose Kells complete paperwork then placed forms at front desk for patient to pick up. JG//CMA

## 2014-07-24 ENCOUNTER — Ambulatory Visit (INDEPENDENT_AMBULATORY_CARE_PROVIDER_SITE_OTHER): Payer: PRIVATE HEALTH INSURANCE | Admitting: Internal Medicine

## 2014-07-24 ENCOUNTER — Encounter: Payer: Self-pay | Admitting: Internal Medicine

## 2014-07-24 VITALS — BP 128/76 | HR 90 | Temp 97.8°F | Ht 66.0 in | Wt 316.4 lb

## 2014-07-24 DIAGNOSIS — E119 Type 2 diabetes mellitus without complications: Secondary | ICD-10-CM

## 2014-07-24 MED ORDER — CANAGLIFLOZIN 100 MG PO TABS: 100.0000 mg | ORAL_TABLET | Freq: Every day | ORAL | Status: DC

## 2014-07-24 MED ORDER — METFORMIN HCL 1000 MG PO TABS: 1000.0000 mg | ORAL_TABLET | Freq: Two times a day (BID) | ORAL | Status: DC

## 2014-07-24 NOTE — Progress Notes (Signed)
Pre visit review using our clinic review tool, if applicable. No additional management support is needed unless otherwise documented below in the visit note. 

## 2014-07-24 NOTE — Progress Notes (Signed)
Subjective:    Patient ID: [name], female    DOB: Sep 13, 1965, 49 y.o.   MRN: [id number]  DOS:  07/24/2014 Type of visit - description : f/u Interval history: Last A1c more than 11. Reports that lately he has not been exercising as much and  her diet in the last 2 months has been poor. Good compliance with metformin  ROS Denies nausea, vomiting, diarrhea. Denies any blurred vision, polydipsia or vaginal yeast infections  Past Medical History  Diagnosis Date  . Hypertension   . Obesity   . GERD (gastroesophageal reflux disease)   . Anxiety state, unspecified 05/16/2013  . Sickle cell trait   . Asthma     "only when I get a sinus infection which is 1-2 X/yr" (08/27/2013)  . Diabetes     "borderline" (08/27/2013)  . Migraine     "once or twice/month now" (08/27/2013)  . Arthritis     "right knee" (08/27/2013)    Past Surgical History  Procedure Laterality Date  . Shoulder arthroscopy w/ rotator cuff repair Right 2006, 2007  . Umbilical hernia repair  2001, 2003  . Wisdom tooth extraction  1985  . Anterior cervical decomp/discectomy fusion  08/27/2013  . Cardiac catheterization  04/2013  . Cholecystectomy  06/1989  . Hernia repair    . Knee arthroscopy Left 08/2003  . Tubal ligation  1991  . Anterior cervical decomp/discectomy fusion N/A 08/27/2013    Procedure: ANTERIOR CERVICAL /DISCECTOMY FUSION (ACDF) C5-C7  2 LEVELS;  Surgeon: Melina Schools, MD;  Location: [facility];  Service: Orthopedics;  Laterality: N/A;  . Left heart catheterization with coronary angiogram N/A 04/15/2013    Procedure: LEFT HEART CATHETERIZATION WITH CORONARY ANGIOGRAM;  Surgeon: Peter M Martinique, MD;  Location[facility] CATH LAB;  Service: Cardiovascular;  Laterality: N/A;    History   Social History  . Marital Status: Single    Spouse Name: N/A    Number of Children: 2  . Years of Education: N/A   Occupational History  . applied for disability    Social History Main Topics  . Smoking status: Never  Smoker   . Smokeless tobacco: Never Used  . Alcohol Use: No  . Drug Use: No  . Sexual Activity: Not Currently    Birth Control/ Protection: None   Other Topics Concern  . Not on file   Social History Narrative   Lives in [place] by herself             Medication List       This list is accurate as of: 07/24/14 11:59 PM.  Always use your most recent med list.               ALPRAZolam 0.25 MG tablet  Commonly known as:  XANAX  Take 1 tablet (0.25 mg total) by mouth 2 (two) times daily as needed for anxiety.     canagliflozin 100 MG Tabs tablet  Commonly known as:  INVOKANA  Take 1 tablet (100 mg total) by mouth daily.     esomeprazole 40 MG capsule  Commonly known as:  NEXIUM  Take 40 mg by mouth daily.     fexofenadine 180 MG tablet  Commonly known as:  ALLEGRA  Take 180 mg by mouth daily.     metFORMIN 1000 MG tablet  Commonly known as:  GLUCOPHAGE  Take 1 tablet (1,000 mg total) by mouth 2 (two) times daily with a meal.     methocarbamol 500 MG  tablet  Commonly known as:  ROBAXIN  Take 1 tablet (500 mg total) by mouth every 6 (six) hours as needed for muscle spasms.     oxyCODONE-acetaminophen 10-325 MG per tablet  Commonly known as:  PERCOCET  Take 1 tablet by mouth every 4 (four) hours as needed for pain.     topiramate 50 MG tablet  Commonly known as:  TOPAMAX  Take 1 tablet (50 mg total) by mouth at bedtime.     TRAMADOL HCL PO  Take by mouth as needed.     vitamin B-12 500 MCG tablet  Commonly known as:  CYANOCOBALAMIN  Take 1,000 mcg by mouth daily.           Objective:   Physical Exam BP 128/76 mmHg  Pulse 90  Temp(Src) 97.8 F (36.6 C) (Oral)  Ht 5\' 6"  (1.676 m)  Wt 316 lb 6 oz (143.507 kg)  BMI 51.09 kg/m2  SpO2 94%  LMP 06/23/2014 General -- alert, well-developed, NAD.  Neurologic--  alert & oriented X3. Speech normal, gait appropriate for age, strength symmetric and appropriate for age.   Psych-- Cognition and judgment appear  intact. Cooperative with normal attention span and concentration. No anxious or depressed appearing.        Assessment & Plan:

## 2014-07-24 NOTE — Patient Instructions (Signed)
Increase metformin 1000 mg twice a day with meals Take invokana once a day with breakfast  Come back in 3 months for a checkup

## 2014-07-26 NOTE — Assessment & Plan Note (Signed)
We went over her previous A1c's, last A1c some increased significantly. In addition to metformin i mentioned other  options including injectables and non-injectables. We elected the following: Improved diet, she does have enough information about it Start a gradual exercise program protecting her knees (stationary bike?) Increase metformin Invokana, s/e discussed  Follow-up in 3 months

## 2014-09-21 ENCOUNTER — Encounter (HOSPITAL_BASED_OUTPATIENT_CLINIC_OR_DEPARTMENT_OTHER): Payer: Self-pay | Admitting: *Deleted

## 2014-09-21 NOTE — Progress Notes (Signed)
Pt to come in for ekg-bmet-see anesthesia To bring all meds and overnight bag-denies sleep apnea

## 2014-09-25 ENCOUNTER — Encounter (HOSPITAL_BASED_OUTPATIENT_CLINIC_OR_DEPARTMENT_OTHER)
Admission: RE | Admit: 2014-09-25 | Discharge: 2014-09-25 | Disposition: A | Discharge status: Home / Self Care | Payer: PRIVATE HEALTH INSURANCE | Source: Ambulatory Visit | Attending: Specialist | Admitting: Specialist

## 2014-09-25 DIAGNOSIS — Z79899 Other long term (current) drug therapy: Secondary | ICD-10-CM | POA: Diagnosis not present

## 2014-09-25 DIAGNOSIS — K219 Gastro-esophageal reflux disease without esophagitis: Secondary | ICD-10-CM | POA: Diagnosis not present

## 2014-09-25 DIAGNOSIS — N62 Hypertrophy of breast: Secondary | ICD-10-CM | POA: Diagnosis present

## 2014-09-25 DIAGNOSIS — E119 Type 2 diabetes mellitus without complications: Secondary | ICD-10-CM | POA: Diagnosis not present

## 2014-09-25 DIAGNOSIS — Z885 Allergy status to narcotic agent status: Secondary | ICD-10-CM | POA: Diagnosis not present

## 2014-09-25 DIAGNOSIS — D573 Sickle-cell trait: Secondary | ICD-10-CM | POA: Diagnosis not present

## 2014-09-25 DIAGNOSIS — E669 Obesity, unspecified: Secondary | ICD-10-CM | POA: Diagnosis not present

## 2014-09-25 LAB — BASIC METABOLIC PANEL
Anion gap: 9 (ref 5–15)
BUN: 5 mg/dL — ABNORMAL LOW (ref 6–23)
CO2: 22 mmol/L (ref 19–32)
Calcium: 9.5 mg/dL (ref 8.4–10.5)
Chloride: 104 mmol/L (ref 96–112)
Creatinine, Ser: 0.69 mg/dL (ref 0.50–1.10)
GFR calc Af Amer: >90 mL/min (ref >90)
GFR calc non Af Amer: >90 mL/min (ref >90)
Glucose, Bld: 115 mg/dL — ABNORMAL HIGH (ref 70–99)
Potassium: 4.3 mmol/L (ref 3.5–5.1)
Sodium: 135 mmol/L (ref 135–145)

## 2014-09-25 NOTE — Progress Notes (Signed)
Seen by dr crews cleared for surgery at [facility]

## 2014-09-28 ENCOUNTER — Ambulatory Visit (HOSPITAL_BASED_OUTPATIENT_CLINIC_OR_DEPARTMENT_OTHER): Payer: PRIVATE HEALTH INSURANCE | Admitting: Anesthesiology

## 2014-09-28 ENCOUNTER — Encounter (HOSPITAL_BASED_OUTPATIENT_CLINIC_OR_DEPARTMENT_OTHER): Payer: Self-pay | Admitting: Anesthesiology

## 2014-09-28 ENCOUNTER — Ambulatory Visit (HOSPITAL_BASED_OUTPATIENT_CLINIC_OR_DEPARTMENT_OTHER)
Admission: RE | Admit: 2014-09-28 | Discharge: 2014-09-28 | Disposition: A | Discharge status: Home / Self Care | Payer: PRIVATE HEALTH INSURANCE | Source: Ambulatory Visit | Attending: Specialist | Admitting: Specialist

## 2014-09-28 ENCOUNTER — Encounter (HOSPITAL_BASED_OUTPATIENT_CLINIC_OR_DEPARTMENT_OTHER)
Admission: RE | Disposition: A | Discharge status: Home / Self Care | Payer: Self-pay | Source: Ambulatory Visit | Attending: Specialist

## 2014-09-28 DIAGNOSIS — E119 Type 2 diabetes mellitus without complications: Secondary | ICD-10-CM | POA: Insufficient documentation

## 2014-09-28 DIAGNOSIS — N62 Hypertrophy of breast: Secondary | ICD-10-CM | POA: Insufficient documentation

## 2014-09-28 DIAGNOSIS — D573 Sickle-cell trait: Secondary | ICD-10-CM | POA: Insufficient documentation

## 2014-09-28 DIAGNOSIS — Z79899 Other long term (current) drug therapy: Secondary | ICD-10-CM | POA: Insufficient documentation

## 2014-09-28 DIAGNOSIS — Z885 Allergy status to narcotic agent status: Secondary | ICD-10-CM | POA: Insufficient documentation

## 2014-09-28 DIAGNOSIS — K219 Gastro-esophageal reflux disease without esophagitis: Secondary | ICD-10-CM | POA: Insufficient documentation

## 2014-09-28 DIAGNOSIS — E669 Obesity, unspecified: Secondary | ICD-10-CM | POA: Insufficient documentation

## 2014-09-28 HISTORY — PX: LIPOSUCTION: SHX10

## 2014-09-28 HISTORY — PX: BREAST REDUCTION SURGERY: SHX8

## 2014-09-28 HISTORY — DX: Presence of spectacles and contact lenses: Z97.3

## 2014-09-28 LAB — POCT HEMOGLOBIN-HEMACUE: Hemoglobin: 13.4 g/dL (ref 12.0–15.0)

## 2014-09-28 LAB — GLUCOSE, CAPILLARY
Glucose-Capillary: 122 mg/dL — ABNORMAL HIGH (ref 70–99)
Glucose-Capillary: 167 mg/dL — ABNORMAL HIGH (ref 70–99)

## 2014-09-28 SURGERY — MAMMOPLASTY, REDUCTION
Anesthesia: General | Site: Breast | Laterality: Bilateral

## 2014-09-28 MED ORDER — CEFAZOLIN SODIUM-DEXTROSE 2-3 GM-% IV SOLR
INTRAVENOUS | Status: AC
  Filled 2014-09-28: qty 50

## 2014-09-28 MED ORDER — MIDAZOLAM HCL 5 MG/5ML IJ SOLN
INTRAMUSCULAR | Status: DC | PRN
  Administered 2014-09-28: 2 mg via INTRAVENOUS

## 2014-09-28 MED ORDER — ONDANSETRON HCL 4 MG/2ML IJ SOLN
INTRAMUSCULAR | Status: DC | PRN
  Administered 2014-09-28: 4 mg via INTRAVENOUS

## 2014-09-28 MED ORDER — MIDAZOLAM HCL 2 MG/2ML IJ SOLN: 1.0000 mg | INTRAMUSCULAR | Status: DC | PRN

## 2014-09-28 MED ORDER — MORPHINE SULFATE 10 MG/ML IJ SOLN
INTRAMUSCULAR | Status: AC
  Filled 2014-09-28: qty 1

## 2014-09-28 MED ORDER — HYDROMORPHONE HCL 1 MG/ML IJ SOLN
0.2500 mg | INTRAMUSCULAR | Status: DC | PRN
  Administered 2014-09-28: 0.5 mg via INTRAVENOUS

## 2014-09-28 MED ORDER — LIDOCAINE HCL (CARDIAC) 20 MG/ML IV SOLN
INTRAVENOUS | Status: DC | PRN
  Administered 2014-09-28: 60 mg via INTRAVENOUS

## 2014-09-28 MED ORDER — LIDOCAINE-EPINEPHRINE 0.5 %-1:200000 IJ SOLN
INTRAMUSCULAR | Status: DC | PRN
  Administered 2014-09-28: 100 mL

## 2014-09-28 MED ORDER — DEXAMETHASONE SODIUM PHOSPHATE 4 MG/ML IJ SOLN
INTRAMUSCULAR | Status: DC | PRN
  Administered 2014-09-28: 10 mg via INTRAVENOUS

## 2014-09-28 MED ORDER — FENTANYL CITRATE 0.05 MG/ML IJ SOLN
INTRAMUSCULAR | Status: AC
  Filled 2014-09-28: qty 2

## 2014-09-28 MED ORDER — SUCCINYLCHOLINE CHLORIDE 20 MG/ML IJ SOLN
INTRAMUSCULAR | Status: DC | PRN
  Administered 2014-09-28: 100 mg via INTRAVENOUS

## 2014-09-28 MED ORDER — EPHEDRINE SULFATE 50 MG/ML IJ SOLN
INTRAMUSCULAR | Status: DC | PRN
  Administered 2014-09-28: 10 mg via INTRAVENOUS

## 2014-09-28 MED ORDER — OXYCODONE HCL 5 MG PO TABS: 5.0000 mg | ORAL_TABLET | Freq: Once | ORAL | Status: DC | PRN

## 2014-09-28 MED ORDER — FENTANYL CITRATE 0.05 MG/ML IJ SOLN: 50.0000 ug | INTRAMUSCULAR | Status: DC | PRN

## 2014-09-28 MED ORDER — HYDROMORPHONE HCL 1 MG/ML IJ SOLN
INTRAMUSCULAR | Status: AC
  Filled 2014-09-28: qty 1

## 2014-09-28 MED ORDER — SODIUM CHLORIDE 0.9 % IV SOLN
INTRAVENOUS | Status: DC | PRN
  Administered 2014-09-28: 500 mL via INTRAMUSCULAR

## 2014-09-28 MED ORDER — MORPHINE SULFATE 10 MG/ML IJ SOLN
INTRAMUSCULAR | Status: DC | PRN
  Administered 2014-09-28 (×4): 2 mg via INTRAVENOUS
  Administered 2014-09-28: 1 mg via INTRAVENOUS
  Administered 2014-09-28: 3 mg via INTRAVENOUS

## 2014-09-28 MED ORDER — ONDANSETRON HCL 4 MG/2ML IJ SOLN: 4.0000 mg | Freq: Once | INTRAMUSCULAR | Status: DC | PRN

## 2014-09-28 MED ORDER — OXYCODONE HCL 5 MG/5ML PO SOLN: 5.0000 mg | Freq: Once | ORAL | Status: DC | PRN

## 2014-09-28 MED ORDER — DEXTROSE 5 % IV SOLN
3.0000 g | INTRAVENOUS | Status: AC
  Administered 2014-09-28: 3 g via INTRAVENOUS

## 2014-09-28 MED ORDER — MIDAZOLAM HCL 2 MG/2ML IJ SOLN
INTRAMUSCULAR | Status: AC
  Filled 2014-09-28: qty 2

## 2014-09-28 MED ORDER — FENTANYL CITRATE 0.05 MG/ML IJ SOLN
INTRAMUSCULAR | Status: DC | PRN
  Administered 2014-09-28: 100 ug via INTRAVENOUS

## 2014-09-28 MED ORDER — LACTATED RINGERS IV SOLN
INTRAVENOUS | Status: DC
  Administered 2014-09-28 (×2): via INTRAVENOUS

## 2014-09-28 MED ORDER — PROPOFOL 10 MG/ML IV BOLUS
INTRAVENOUS | Status: DC | PRN
Start: 2014-09-28 — End: 2014-09-28
  Administered 2014-09-28: 300 mg via INTRAVENOUS
  Administered 2014-09-28: 50 mg via INTRAVENOUS

## 2014-09-28 MED ORDER — CEFAZOLIN SODIUM 1-5 GM-% IV SOLN
INTRAVENOUS | Status: AC
  Filled 2014-09-28: qty 50

## 2014-09-28 SURGICAL SUPPLY — 90 items
BAG DECANTER FOR FLEXI CONT (MISCELLANEOUS) ×2 IMPLANT
BENZOIN TINCTURE PRP APPL 2/3 (GAUZE/BANDAGES/DRESSINGS) ×4 IMPLANT
BINDER BREAST 3XL (BIND) ×2 IMPLANT
BINDER BREAST XLRG (GAUZE/BANDAGES/DRESSINGS) IMPLANT
BINDER BREAST XXLRG (GAUZE/BANDAGES/DRESSINGS) IMPLANT
BLADE HEX COATED 2.75 (ELECTRODE) IMPLANT
BLADE KNIFE  20 PERSONNA (BLADE) ×2
BLADE KNIFE 20 PERSONNA (BLADE) ×2 IMPLANT
BLADE KNIFE PERSONA 10 (BLADE) ×10 IMPLANT
BLADE KNIFE PERSONA 15 (BLADE) ×2 IMPLANT
BNDG COHESIVE 4X5 TAN STRL (GAUZE/BANDAGES/DRESSINGS) IMPLANT
BNDG GAUZE ELAST 4 BULKY (GAUZE/BANDAGES/DRESSINGS) IMPLANT
CANISTER SUCT 1200ML W/VALVE (MISCELLANEOUS) ×2 IMPLANT
CLEANER CAUTERY TIP 5X5 PAD (MISCELLANEOUS) IMPLANT
COVER BACK TABLE 60X90IN (DRAPES) ×2 IMPLANT
COVER MAYO STAND STRL (DRAPES) ×2 IMPLANT
DECANTER SPIKE VIAL GLASS SM (MISCELLANEOUS) ×4 IMPLANT
DRAIN CHANNEL 10F 3/8 F FF (DRAIN) ×4 IMPLANT
DRAPE LAPAROSCOPIC ABDOMINAL (DRAPES) ×2 IMPLANT
DRAPE LAPAROTOMY 100X72 PEDS (DRAPES) IMPLANT
DRAPE U-SHAPE 76X120 STRL (DRAPES) IMPLANT
DRAPE UTILITY XL STRL (DRAPES) ×2 IMPLANT
DRSG PAD ABDOMINAL 8X10 ST (GAUZE/BANDAGES/DRESSINGS) ×8 IMPLANT
ELECT NEEDLE TIP 2.8 STRL (NEEDLE) IMPLANT
ELECT REM PT RETURN 9FT ADLT (ELECTROSURGICAL) ×2
ELECTRODE REM PT RTRN 9FT ADLT (ELECTROSURGICAL) ×1 IMPLANT
EVACUATOR SILICONE 100CC (DRAIN) ×4 IMPLANT
FILTER 7/8 IN (FILTER) ×2 IMPLANT
FILTER LIPOSUCTION (MISCELLANEOUS) ×2 IMPLANT
GAUZE SPONGE 4X4 12PLY STRL (GAUZE/BANDAGES/DRESSINGS) ×8 IMPLANT
GAUZE XEROFORM 1X8 LF (GAUZE/BANDAGES/DRESSINGS) IMPLANT
GAUZE XEROFORM 5X9 LF (GAUZE/BANDAGES/DRESSINGS) ×4 IMPLANT
GLOVE BIO SURGEON STRL SZ 6.5 (GLOVE) ×2 IMPLANT
GLOVE BIOGEL M STRL SZ7.5 (GLOVE) ×2 IMPLANT
GLOVE BIOGEL PI IND STRL 8 (GLOVE) ×1 IMPLANT
GLOVE BIOGEL PI INDICATOR 8 (GLOVE) ×1
GLOVE ECLIPSE 7.0 STRL STRAW (GLOVE) ×2 IMPLANT
GOWN STRL REUS W/ TWL LRG LVL3 (GOWN DISPOSABLE) IMPLANT
GOWN STRL REUS W/ TWL XL LVL3 (GOWN DISPOSABLE) ×2 IMPLANT
GOWN STRL REUS W/TWL LRG LVL3 (GOWN DISPOSABLE)
GOWN STRL REUS W/TWL XL LVL3 (GOWN DISPOSABLE) ×2
IV LACTATED RINGERS 1000ML (IV SOLUTION) IMPLANT
IV NS 500ML (IV SOLUTION) ×1
IV NS 500ML BAXH (IV SOLUTION) ×1 IMPLANT
LINER CANISTER 1000CC FLEX (MISCELLANEOUS) ×2 IMPLANT
NDL SAFETY ECLIPSE 18X1.5 (NEEDLE) IMPLANT
NEEDLE HYPO 18GX1.5 SHARP (NEEDLE)
NEEDLE HYPO 25X1 1.5 SAFETY (NEEDLE) IMPLANT
NEEDLE SPNL 18GX3.5 QUINCKE PK (NEEDLE) ×2 IMPLANT
NS IRRIG 1000ML POUR BTL (IV SOLUTION) ×4 IMPLANT
PACK BASIN DAY SURGERY FS (CUSTOM PROCEDURE TRAY) ×2 IMPLANT
PAD CLEANER CAUTERY TIP 5X5 (MISCELLANEOUS)
PEN SKIN MARKING BROAD TIP (MISCELLANEOUS) ×2 IMPLANT
PENCIL BUTTON HOLSTER BLD 10FT (ELECTRODE) IMPLANT
PILLOW FOAM RUBBER ADULT (PILLOWS) ×2 IMPLANT
PIN SAFETY STERILE (MISCELLANEOUS) ×2 IMPLANT
SHEET MEDIUM DRAPE 40X70 STRL (DRAPES) IMPLANT
SHEETING SILICONE GEL EPI DERM (MISCELLANEOUS) IMPLANT
SLEEVE SCD COMPRESS KNEE MED (MISCELLANEOUS) ×2 IMPLANT
SPECIMEN JAR MEDIUM (MISCELLANEOUS) IMPLANT
SPECIMEN JAR X LARGE (MISCELLANEOUS) ×4 IMPLANT
SPONGE GAUZE 4X4 12PLY STER LF (GAUZE/BANDAGES/DRESSINGS) IMPLANT
SPONGE LAP 18X18 X RAY DECT (DISPOSABLE) ×8 IMPLANT
STOCKINETTE 4X48 STRL (DRAPES) IMPLANT
STOCKINETTE IMPERVIOUS LG (DRAPES) IMPLANT
STRIP CLOSURE SKIN 1/8X3 (GAUZE/BANDAGES/DRESSINGS) IMPLANT
STRIP SUTURE WOUND CLOSURE 1/2 (SUTURE) ×14 IMPLANT
SUT ETHILON 3 0 PS 1 (SUTURE) IMPLANT
SUT MNCRL AB 3-0 PS2 18 (SUTURE) ×16 IMPLANT
SUT MON AB 2-0 CT1 36 (SUTURE) IMPLANT
SUT MON AB 5-0 PS2 18 (SUTURE) ×4 IMPLANT
SUT PROLENE 3 0 PS 2 (SUTURE) ×14 IMPLANT
SUT PROLENE 4 0 PS 2 18 (SUTURE) IMPLANT
SUT VIC AB 0 CT1 27 (SUTURE)
SUT VIC AB 0 CT1 27XBRD ANBCTR (SUTURE) IMPLANT
SYR 20CC LL (SYRINGE) IMPLANT
SYR 50ML LL SCALE MARK (SYRINGE) ×4 IMPLANT
SYR CONTROL 10ML LL (SYRINGE) IMPLANT
TAPE HYPAFIX 6X30 (GAUZE/BANDAGES/DRESSINGS) ×2 IMPLANT
TAPE MEASURE 72IN RETRACT (INSTRUMENTS)
TAPE MEASURE LINEN 72IN RETRCT (INSTRUMENTS) IMPLANT
TAPE PAPER 1X10 WHT MICROPORE (GAUZE/BANDAGES/DRESSINGS) ×2 IMPLANT
TOWEL OR 17X24 6PK STRL BLUE (TOWEL DISPOSABLE) ×4 IMPLANT
TOWEL OR NON WOVEN STRL DISP B (DISPOSABLE) ×2 IMPLANT
TUBE CONNECTING 20X1/4 (TUBING) ×2 IMPLANT
TUBING INFILTRATION IT-10001 (TUBING) IMPLANT
TUBING SET GRADUATE ASPIR 12FT (MISCELLANEOUS) ×2 IMPLANT
UNDERPAD 30X30 INCONTINENT (UNDERPADS AND DIAPERS) ×6 IMPLANT
VAC PENCILS W/TUBING CLEAR (MISCELLANEOUS) ×2 IMPLANT
YANKAUER SUCT BULB TIP NO VENT (SUCTIONS) ×4 IMPLANT

## 2014-09-28 NOTE — Brief Op Note (Signed)
09/28/2014  12:48 PM  PATIENT:  [name]  49 y.o. female  PRE-OPERATIVE DIAGNOSIS:  macromastia  POST-OPERATIVE DIAGNOSIS:  macromastia  PROCEDURE:  Procedure(s): MAMMARY REDUCTION  (BREAST) BILATERAL (Bilateral) LIPOSUCTION (Bilateral)  SURGEON:  Surgeon(s) and Role:    * Cristine Polio, MD - Primary  PHYSICIAN ASSISTANT:   ASSISTANTS: none   ANESTHESIA:   general  EBL:  Total I/O In: 2000 [I.V.:2000] Out: -   BLOOD ADMINISTERED:none  DRAINS: (ccccc) Jackson-Pratt drain(s) with closed bulb suction in the right and left lateral chest areas   LOCAL MEDICATIONS USED:  LIDOCAINE   SPECIMEN:  Excision  DISPOSITION OF SPECIMEN:  PATHOLOGY  COUNTS:  YES  TOURNIQUET:  * No tourniquets in log *  DICTATION: .Other Dictation: Dictation Number [id number]  PLAN OF CARE: Discharge to home after PACU  PATIENT DISPOSITION:  PACU - hemodynamically stable.   Delay start of Pharmacological VTE agent (>24hrs) due to surgical blood loss or risk of bleeding: yes

## 2014-09-28 NOTE — Anesthesia Postprocedure Evaluation (Signed)
  Anesthesia Post-op Note  Patient: [name]  Procedure(s) Performed: Procedure(s): MAMMARY REDUCTION  (BREAST) BILATERAL (Bilateral) LIPOSUCTION (Bilateral)  Patient Location: PACU  Anesthesia Type: General   Level of Consciousness: awake, alert  and oriented  Airway and Oxygen Therapy: Patient Spontanous Breathing  Post-op Pain: mild  Post-op Assessment: Post-op Vital signs reviewed  Post-op Vital Signs: Reviewed  Last Vitals:  Filed Vitals:   09/28/14 1400  BP: 158/101  Pulse: 95  Temp:   Resp: 15    Complications: No apparent anesthesia complications

## 2014-09-28 NOTE — Anesthesia Preprocedure Evaluation (Addendum)
Anesthesia Evaluation  Patient identified by MRN, date of birth, ID band Patient awake    Reviewed: Allergy & Precautions, NPO status , Patient's Chart, lab work & pertinent test results  Airway        Dental   Pulmonary asthma ,          Cardiovascular hypertension (borderline, no meds), Pt. on medications  '14 cath: no coronary disease '07 ECHO: EF 55-65%, valves OK   Neuro/Psych  Headaches,    GI/Hepatic GERD-  Medicated,  Endo/Other  diabetes, Well Controlled, Type 2, Oral Hypoglycemic AgentsMorbid obesity  Renal/GU      Musculoskeletal   Abdominal   Peds  Hematology  (+) Sickle cell trait ,   Anesthesia Other Findings   Reproductive/Obstetrics                          Anesthesia Physical Anesthesia Plan  ASA: III  Anesthesia Plan: General   Post-op Pain Management:    Induction: Intravenous  Airway Management Planned: Oral ETT  Additional Equipment:   Intra-op Plan:   Post-operative Plan: Extubation in OR  Informed Consent: I have reviewed the patients History and Physical, chart, labs and discussed the procedure including the risks, benefits and alternatives for the proposed anesthesia with the patient or authorized representative who has indicated his/her understanding and acceptance.   Dental advisory given  Plan Discussed with: CRNA, Anesthesiologist and Surgeon  Anesthesia Plan Comments:         Anesthesia Quick Evaluation

## 2014-09-28 NOTE — Transfer of Care (Signed)
Immediate Anesthesia Transfer of Care Note  Patient: [name]  Procedure(s) Performed: Procedure(s): MAMMARY REDUCTION  (BREAST) BILATERAL (Bilateral) LIPOSUCTION (Bilateral)  Patient Location: PACU  Anesthesia Type:General  Level of Consciousness: awake and sedated  Airway & Oxygen Therapy: Patient Spontanous Breathing and Patient connected to face mask oxygen  Post-op Assessment: Report given to RN and Post -op Vital signs reviewed and stable  Post vital signs: Reviewed and stable  Last Vitals:  Filed Vitals:   09/28/14 1315  BP:   Pulse:   Temp: 36.7 C  Resp:     Complications: No apparent anesthesia complications

## 2014-09-28 NOTE — Discharge Instructions (Signed)
Breast Reduction Care After Refer to this sheet in the next few weeks. These instructions provide you with information on caring for yourself after your procedure. Your caregiver may also give you more specific instructions. Your treatment has been planned according to current medical practices, but problems sometimes occur. Call your caregiver if you have any problems or questions after your procedure. HOME CARE INSTRUCTIONS 1. Do not lift more than 5 pounds with one arm, or 10 pounds with both arms, for 1 month.  2. Do not sleep on your stomach for 4 to 6 weeks.  3. Do not do vigorous exercise such as bouncing, aerobics, or jumping for 6 weeks. Walking is not restricted.  4. Do not drive while you are taking prescription pain medicine.  5. Avoid prolonged sun exposure.  6. Keep dressings dry and clean 7. Measure jp drainage every 12 hrs and measure  8. You may slowly go back to your normal diet. Start with a light meal and increase as comfortable.  9. You may shower 24 hours after your drains are removed unless instructed differently by your caregiver.  10. Take your pain medicine as prescribed. Discomfort is normal after breast reduction surgery.  11. Keep the head of your bed elevated 40 degrees 12.  : Call the office if you notice:  You have a fever.   You notice drainage from the incision that smells bad.   You have persistent pain.   You have persistent bleeding from the incision or nipple discharge.   You develop increased swelling or swelling that is greater in one breast than in the other.  MAKE SURE YOU:   Understand these instructions.   Will watch your condition.   Will get help right away if you are not doing well or get worse.  Document Released: 02/08/2004 Document Revised: 03/08/2011 Document Reviewed: 09/19/2007 [facility] Patient Information 2012 [place].   Post Anesthesia Home Care Instructions  Activity: Get plenty of rest for the remainder of the  day. A responsible adult should stay with you for 24 hours following the procedure.  For the next 24 hours, DO NOT: -Drive a car -Paediatric nurse -Drink alcoholic beverages -Take any medication unless instructed by your physician -Make any legal decisions or sign important papers.  Meals: Start with liquid foods such as gelatin or soup. Progress to regular foods as tolerated. Avoid greasy, spicy, heavy foods. If nausea and/or vomiting occur, drink only clear liquids until the nausea and/or vomiting subsides. Call your physician if vomiting continues.  Special Instructions/Symptoms: Your throat may feel dry or sore from the anesthesia or the breathing tube placed in your throat during surgery. If this causes discomfort, gargle with warm salt water. The discomfort should disappear within 24 hours.   About my Jackson-Pratt Bulb Drain  What is a Jackson-Pratt bulb? A Jackson-Pratt is a soft, round device used to collect drainage. It is connected to a long, thin drainage catheter, which is held in place by one or two small stiches near your surgical incision site. When the bulb is squeezed, it forms a vacuum, forcing the drainage to empty into the bulb.  Emptying the Jackson-Pratt bulb- To empty the bulb: 1. Release the plug on the top of the bulb. 2. Pour the bulb's contents into a measuring container which your nurse will provide. 3. Record the time emptied and amount of drainage. Empty the drain(s) as often as your     doctor or nurse recommends.  Date  Time                    Amount (Drain 1)                 Amount (Drain  2)  _____________________________________________________________________  _____________________________________________________________________  _____________________________________________________________________  _____________________________________________________________________  _____________________________________________________________________  _____________________________________________________________________  _____________________________________________________________________  _____________________________________________________________________  Squeezing the Jackson-Pratt Bulb- To squeeze the bulb: 1. Make sure the plug at the top of the bulb is open. 2. Squeeze the bulb tightly in your fist. You will hear air squeezing from the bulb. 3. Replace the plug while the bulb is squeezed. 4. Use a safety pin to attach the bulb to your clothing. This will keep the catheter from     pulling at the bulb insertion site.  When to call your doctor- Call your doctor if:  Drain site becomes red, swollen or hot.  You have a fever greater than 101 degrees F.  There is oozing at the drain site.  Drain falls out (apply a guaze bandage over the drain hole and secure it with tape).  Drainage increases daily not related to activity patterns. (You will usually have more drainage when you are active than when you are resting.)  Drainage has a bad odor.

## 2014-09-28 NOTE — H&P (Signed)
[name] is an 49 y.o. female.   Chief Complaint: Increased macromastia HPI: Increased back and shoulder pain with refractory intertriginous changes  Past Medical History  Diagnosis Date  . Obesity   . GERD (gastroesophageal reflux disease)   . Anxiety state, unspecified 05/16/2013  . Sickle cell trait   . Asthma     "only when I get a sinus infection which is 1-2 X/yr" (08/27/2013)  . Diabetes     "borderline" (08/27/2013)  . Migraine     "once or twice/month now" (08/27/2013)  . Arthritis     "right knee" (08/27/2013)  . Hypertension     no meds  . Wears glasses     Past Surgical History  Procedure Laterality Date  . Shoulder arthroscopy w/ rotator cuff repair Right 2006, 2007  . Umbilical hernia repair  2001, 2003  . Wisdom tooth extraction  1985  . Anterior cervical decomp/discectomy fusion  08/27/2013  . Cardiac catheterization  04/2013  . Cholecystectomy  06/1989  . Hernia repair    . Knee arthroscopy Left 08/2003  . Tubal ligation  1991  . Anterior cervical decomp/discectomy fusion N/A 08/27/2013    Procedure: ANTERIOR CERVICAL /DISCECTOMY FUSION (ACDF) C5-C7  2 LEVELS;  Surgeon: Melina Schools, MD;  Location: [place];  Service: Orthopedics;  Laterality: N/A;  . Left heart catheterization with coronary angiogram N/A 04/15/2013    Procedure: LEFT HEART CATHETERIZATION WITH CORONARY ANGIOGRAM;  Surgeon: Peter M Martinique, MD;  Location: [facility] CATH LAB;  Service: Cardiovascular;  Laterality: N/A;    Family History  Problem Relation Age of Onset  . Colon cancer Neg Hx   . Breast cancer       GM  . Diabetes Father     F, B, S  . Pancreatic cancer      GM  . CAD Sister     MI at age 37  . CAD Brother     CHF, died at age 80  . Heart attack Brother     died suddenly @ 64  . CAD Father     died of MI @ 58  . Kidney disease Father    Social History:  reports that she has never smoked. She has never used smokeless tobacco. She reports that she does not drink alcohol or use  illicit drugs.  Allergies:  Allergies  Allergen Reactions  . Vicodin [Hydrocodone-Acetaminophen] Swelling and Rash    Medications Prior to Admission  Medication Sig Dispense Refill  . ALPRAZolam (XANAX) 0.25 MG tablet Take 1 tablet (0.25 mg total) by mouth 2 (two) times daily as needed for anxiety. (Patient not taking: Reported on 07/24/2014) 60 tablet 0  . canagliflozin (INVOKANA) 100 MG TABS tablet Take 1 tablet (100 mg total) by mouth daily. 30 tablet 6  . esomeprazole (NEXIUM) 40 MG capsule Take 40 mg by mouth daily.    . fexofenadine (ALLEGRA) 180 MG tablet Take 180 mg by mouth daily.    . metFORMIN (GLUCOPHAGE) 1000 MG tablet Take 1 tablet (1,000 mg total) by mouth 2 (two) times daily with a meal. 60 tablet 10  . methocarbamol (ROBAXIN) 500 MG tablet Take 1 tablet (500 mg total) by mouth every 6 (six) hours as needed for muscle spasms. 60 tablet 2  . topiramate (TOPAMAX) 50 MG tablet Take 1 tablet (50 mg total) by mouth at bedtime. 90 tablet 2  . TRAMADOL HCL PO Take by mouth as needed.    . vitamin B-12 (CYANOCOBALAMIN) 500 MCG  tablet Take 1,000 mcg by mouth daily.       No results found for this or any previous visit (from the past 48 hour(s)). No results found.  Review of Systems  Constitutional: Negative.   Eyes: Negative.   Respiratory: Negative.   Cardiovascular: Negative.   Gastrointestinal: Negative.   Genitourinary: Negative.   Musculoskeletal: Positive for back pain, joint pain and neck pain.  Skin: Positive for rash.  Neurological: Negative.   Endo/Heme/Allergies: Negative.   Psychiatric/Behavioral: Negative.     Height 5\' 6"  (1.676 m), weight 142.883 kg (315 lb), last menstrual period 09/10/2014. Physical Exam   Assessment/Plan Severe macromastia  And accessory breast tissue for bilateral breast reductions and excision of accessory breast tissue  [name] 09/28/2014, 8:25 AM

## 2014-09-28 NOTE — Anesthesia Procedure Notes (Signed)
Procedure Name: Intubation Performed by: Terrance Mass Pre-anesthesia Checklist: Patient identified, Timeout performed, Emergency Drugs available, Suction available and Patient being monitored Patient Re-evaluated:Patient Re-evaluated prior to inductionOxygen Delivery Method: Circle system utilized Preoxygenation: Pre-oxygenation with 100% oxygen Intubation Type: IV induction Ventilation: Mask ventilation without difficulty Laryngoscope Size: Miller and 2 Grade View: Grade II Tube type: Oral Tube size: 7.0 mm Number of attempts: 1 Airway Equipment and Method: Stylet Placement Confirmation: ETT inserted through vocal cords under direct vision,  positive ETCO2 and breath sounds checked- equal and bilateral Secured at: 23 cm Tube secured with: Tape Dental Injury: Teeth and Oropharynx as per pre-operative assessment

## 2014-09-29 ENCOUNTER — Encounter (HOSPITAL_BASED_OUTPATIENT_CLINIC_OR_DEPARTMENT_OTHER): Payer: Self-pay | Admitting: Specialist

## 2014-09-29 NOTE — Op Note (Signed)
NAME:  GRACI, HULCE NO.:  [id number]  MEDICAL RECORD NO.:  [id number]  LOCATION:                                 FACILITY:  PHYSICIAN:  Berneta Sages L. Towanda Malkin, M.D.DATE OF BIRTH:  03/13/1966  DATE OF PROCEDURE:  09/28/2014 DATE OF DISCHARGE:  09/28/2014                              OPERATIVE REPORT   A 49 year old lady with severe macromastia, tremendous amounts of accessory breast tissue bilaterally.  PROCEDURES DONE:  Bilateral breast reductions using the inferior pedicle technique, excision of tremendous amounts of accessory breast tissue in the axillary and latissimus dorsi regions.  ANESTHESIA:  General.  Preoperatively, the patient was sat up and drawn for the inferior pedicle reduction mammoplasty including excision of axillary breast tissue and ancillary breast tissue.  She then underwent general anesthesia, intubated orally, prep was done to the chest and breast areas in routine fashion using Hibiclens soap and solution, walled off with sterile towels and drapes so as to make a sterile field.  Xylocaine 0.25% with epinephrine was injected locally, 300 mL per side.  The wounds were scored with #15 blade and the skin of the inferior pedicle was de-epithelialized with #20 blade.  Medial and lateral fatty dermal pedicles were excised down to underlying pectoralis major muscle. Laterally excision was made of axillary breast tissue, tremendous amounts in the right and left side, right side greater than the left. After proper hemostasis, the flaps were transposed and stayed with 3-0 Monocryl sutures and 3-0 Monocryl subcutaneously x2 layers and then a running subcuticular stitch of 3-0 Monocryl and 5-0 Monocryl throughout the inverted T.  The wounds were drained with #10 fully-fluted Blake drains, which were placed in depths of the wound and brought out through the lateral-most portion of the incisions and secured with 3-0 Prolene sutures.  At the end of  the procedure, the nipple-areolar complexes were examined after the flaps have been then trimmed appropriately, and the wounds were cleansed, covered with Steri-Strips, soft dressing, Xeroform, 4x4s, ABDs, Hypafix tape.  She withstood the procedures very well.  ESTIMATED BLOOD LOSS:  150 mL.  COMPLICATIONS:  None.     Odella Aquas. Towanda Malkin, M.D.     Elie Confer  D:  09/28/2014  T:  09/29/2014  Job:  [id number]

## 2014-10-05 IMAGING — CR DG CHEST 2V
2 series · 2 of 2 positions shown · non-contrast
Comparison: 07/19/2012.

CLINICAL DATA: Cough and congestion for 3 weeks.  History of
asthma.Short of breath.

CHEST - 2 VIEW

[w chest pa]
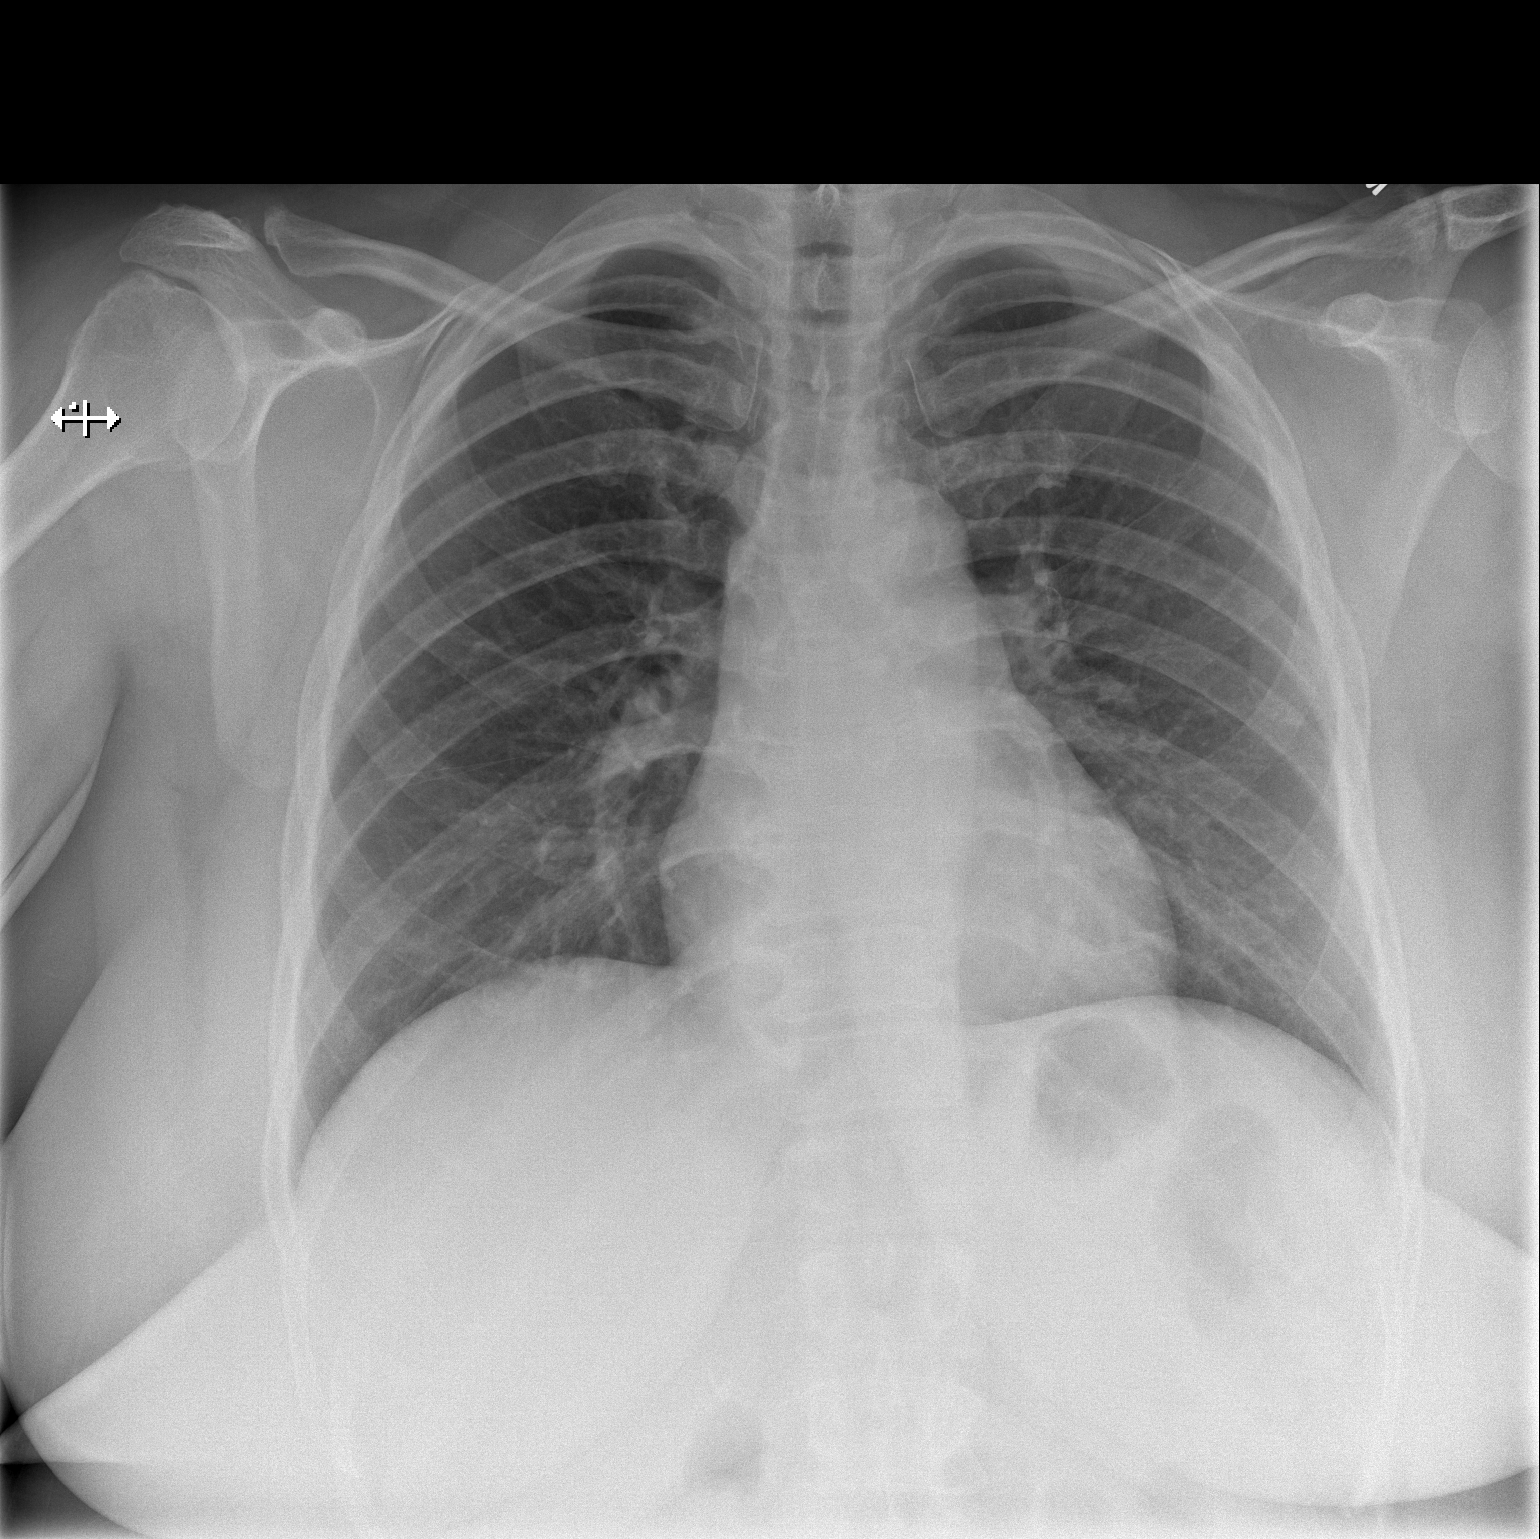

[w chest lat]
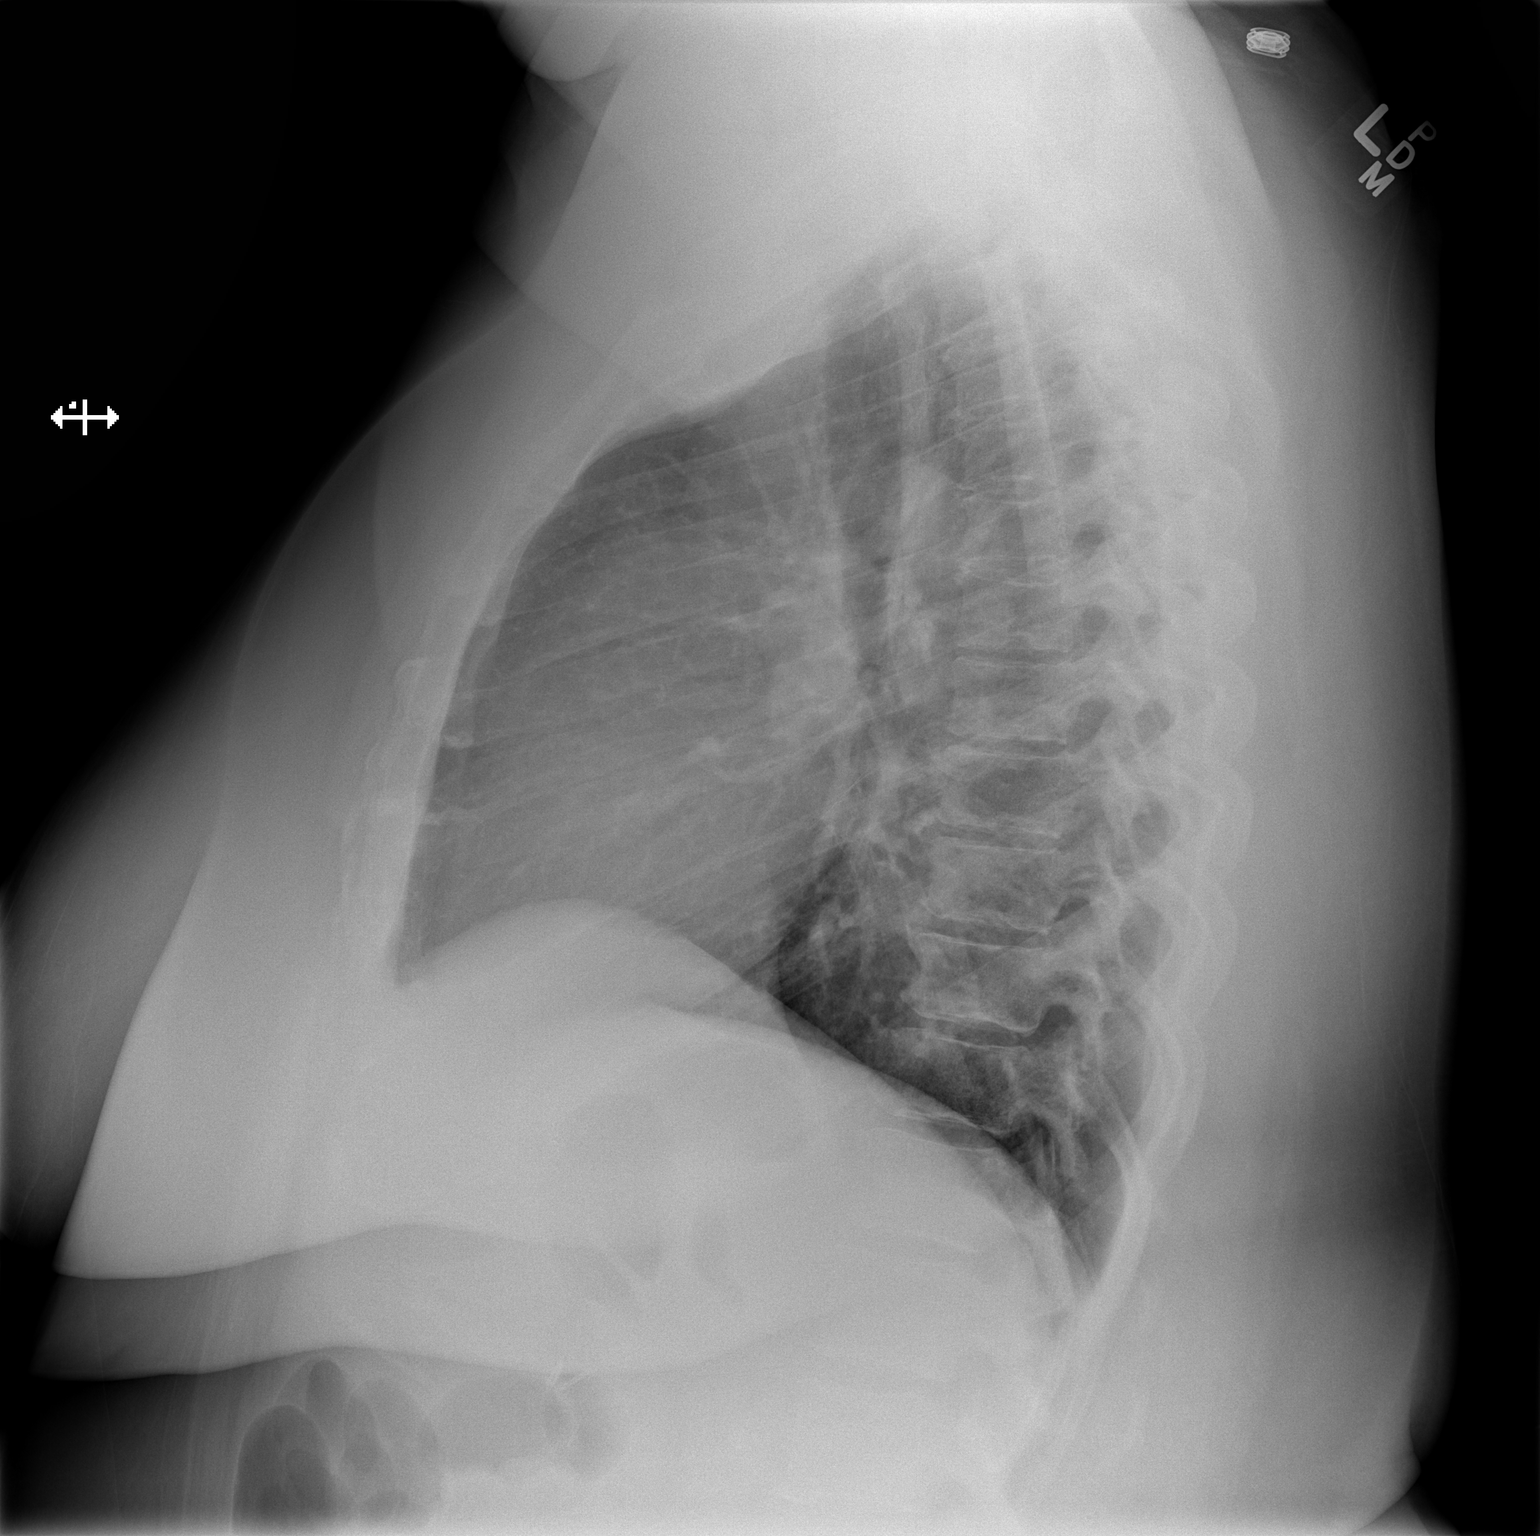

[2 of 2 positions shown; findings below may reference images not displayed]

FINDINGS: Cardiopericardial silhouette within normal limits.
Mediastinal contours normal. Trachea midline.  No airspace disease
or effusion.  Question prior subacromial decompression on the
right.  Left AC joint osteoarthritis is noted.
IMPRESSION: No active cardiopulmonary disease.

## 2014-10-26 ENCOUNTER — Telehealth: Payer: Self-pay | Admitting: Internal Medicine

## 2014-10-26 NOTE — Telephone Encounter (Signed)
Pre Visit letter sent  °

## 2014-11-05 ENCOUNTER — Other Ambulatory Visit: Payer: Self-pay

## 2014-11-07 ENCOUNTER — Emergency Department (INDEPENDENT_AMBULATORY_CARE_PROVIDER_SITE_OTHER)
Admission: EM | Admit: 2014-11-07 | Discharge: 2014-11-07 | Disposition: A | Discharge status: Home / Self Care | Payer: No Typology Code available for payment source | Source: Home / Self Care | Attending: Family Medicine | Admitting: Family Medicine

## 2014-11-07 ENCOUNTER — Encounter (HOSPITAL_COMMUNITY): Payer: Self-pay | Admitting: Emergency Medicine

## 2014-11-07 DIAGNOSIS — N611 Abscess of the breast and nipple: Secondary | ICD-10-CM

## 2014-11-07 DIAGNOSIS — N61 Inflammatory disorders of breast: Secondary | ICD-10-CM | POA: Diagnosis not present

## 2014-11-07 MED ORDER — CEFUROXIME AXETIL 500 MG PO TABS: 500.0000 mg | ORAL_TABLET | Freq: Two times a day (BID) | ORAL | Status: DC

## 2014-11-07 MED ORDER — SULFAMETHOXAZOLE-TRIMETHOPRIM 800-160 MG PO TABS: 2.0000 | ORAL_TABLET | Freq: Two times a day (BID) | ORAL | Status: AC

## 2014-11-07 NOTE — ED Provider Notes (Signed)
CSN: [id number]     Arrival date & time 11/07/14  9767 History   First MD Initiated Contact with Patient 11/07/14 1004     Chief Complaint  Patient presents with  . Cyst   (Consider location/radiation/quality/duration/timing/severity/associated sxs/prior Treatment) HPI     49 year old female presents complaining of breast pain and tenderness. She had a bilateral breast reduction surgery done one month ago by Dr. Towanda Malkin. Since then she has had pain in the breast. In the past 2 weeks she has increasing pain and a spot in the medial portion of the left breast. Also the entire breast appears to be red and is tender. Dr. Neita Goodnight office but her on Keflex about 10 days ago, she has been taking 500 mg every 6 hours. Her symptoms have worsened since then. She has noted spots of drainage beneath each breast, the right breast seems to be getting better with antibiotics for the left is not. Also she is concerned because they told her that all of the sutures out but she still has sutures in both breasts. She has had some subjective chills and fever, MAXIMUM TEMPERATURE 99.49F. She has a follow-up appointment with Dr. Towanda Malkin in 2 days.  Past Medical History  Diagnosis Date  . Obesity   . GERD (gastroesophageal reflux disease)   . Anxiety state, unspecified 05/16/2013  . Sickle cell trait   . Asthma     "only when I get a sinus infection which is 1-2 X/yr" (08/27/2013)  . Diabetes     "borderline" (08/27/2013)  . Migraine     "once or twice/month now" (08/27/2013)  . Arthritis     "right knee" (08/27/2013)  . Hypertension     no meds  . Wears glasses    Past Surgical History  Procedure Laterality Date  . Shoulder arthroscopy w/ rotator cuff repair Right 2006, 2007  . Umbilical hernia repair  2001, 2003  . Wisdom tooth extraction  1985  . Anterior cervical decomp/discectomy fusion  08/27/2013  . Cardiac catheterization  04/2013  . Cholecystectomy  06/1989  . Hernia repair    . Knee  arthroscopy Left 08/2003  . Tubal ligation  1991  . Anterior cervical decomp/discectomy fusion N/A 08/27/2013    Procedure: ANTERIOR CERVICAL /DISCECTOMY FUSION (ACDF) C5-C7  2 LEVELS;  Surgeon: Melina Schools, MD;  Location: [place];  Service: Orthopedics;  Laterality: N/A;  . Left heart catheterization with coronary angiogram N/A 04/15/2013    Procedure: LEFT HEART CATHETERIZATION WITH CORONARY ANGIOGRAM;  Surgeon: Peter M Martinique, MD;  Location: [facility] CATH LAB;  Service: Cardiovascular;  Laterality: N/A;  . Breast reduction surgery Bilateral 09/28/2014    Procedure: MAMMARY REDUCTION  (BREAST) BILATERAL;  Surgeon: Cristine Polio, MD;  Location: [place];  Service: Plastics;  Laterality: Bilateral;  . Liposuction Bilateral 09/28/2014    Procedure: LIPOSUCTION;  Surgeon: Cristine Polio, MD;  Location: [place];  Service: Plastics;  Laterality: Bilateral;   Family History  Problem Relation Age of Onset  . Colon cancer Neg Hx   . Breast cancer       GM  . Diabetes Father     F, B, S  . Pancreatic cancer      GM  . CAD Sister     MI at age 31  . CAD Brother     CHF, died at age 23  . Heart attack Brother     died suddenly @ 10  . CAD Father  died of MI @ 27  . Kidney disease Father    History  Substance Use Topics  . Smoking status: Never Smoker   . Smokeless tobacco: Never Used  . Alcohol Use: No   OB History    No data available     Review of Systems  Musculoskeletal:       Breast pain, see history of present illness  All other systems reviewed and are negative.   Allergies  Vicodin  Home Medications   Prior to Admission medications   Medication Sig Start Date End Date Taking? Authorizing Provider  ALPRAZolam (XANAX) 0.25 MG tablet Take 1 tablet (0.25 mg total) by mouth 2 (two) times daily as needed for anxiety. Patient not taking: Reported on 07/24/2014 07/13/14   Colon Branch, MD  canagliflozin [facility]) 100 MG TABS tablet Take 1  tablet (100 mg total) by mouth daily. 07/24/14   Colon Branch, MD  cefUROXime (CEFTIN) 500 MG tablet Take 1 tablet (500 mg total) by mouth 2 (two) times daily with a meal. 11/07/14   Liam Graham, PA-C  fexofenadine (ALLEGRA) 180 MG tablet Take 180 mg by mouth daily.    Historical Provider, MD  metFORMIN (GLUCOPHAGE) 1000 MG tablet Take 1 tablet (1,000 mg total) by mouth 2 (two) times daily with a meal. 07/24/14   Colon Branch, MD  methocarbamol (ROBAXIN) 500 MG tablet Take 1 tablet (500 mg total) by mouth every 6 (six) hours as needed for muscle spasms. 07/13/14   Colon Branch, MD  sulfamethoxazole-trimethoprim (BACTRIM DS,SEPTRA DS) 800-160 MG per tablet Take 2 tablets by mouth 2 (two) times daily. 11/07/14 11/14/14  Freeman Caldron Rowan Blaker, PA-C  topiramate (TOPAMAX) 50 MG tablet Take 1 tablet (50 mg total) by mouth at bedtime. 07/13/14   Colon Branch, MD  TRAMADOL HCL PO Take by mouth as needed.    Historical Provider, MD  vitamin B-12 (CYANOCOBALAMIN) 500 MCG tablet Take 1,000 mcg by mouth daily.     Historical Provider, MD   BP 128/86 mmHg  Pulse 80  Temp(Src) 98.1 F (36.7 C) (Oral)  Resp 16  SpO2 98%  LMP 11/05/2014 Physical Exam  Constitutional: She is oriented to person, place, and time. Vital signs are normal. She appears well-developed and well-nourished. No distress.  HENT:  Head: Normocephalic and atraumatic.  Pulmonary/Chest: Effort normal. No respiratory distress. She exhibits tenderness. Right breast exhibits tenderness. Left breast exhibits mass and tenderness.  Both breasts are tender. The left breast has erythema and warmth in the entire lower portion of the breast. A portion at approximately 9:00 position, there is an area of induration and tenderness.  Neurological: She is alert and oriented to person, place, and time. She has normal strength. Coordination normal.  Skin: Skin is warm and dry. No rash noted. She is not diaphoretic.  Psychiatric: She has a normal mood and affect. Judgment  normal.  Nursing note and vitals reviewed.   ED Course  [place] bedside Date/Time: 11/07/2014 10:31 AM Performed by: Liam Graham Authorized by: Ihor Gully D Consent: Verbal consent obtained. Risks and benefits: risks, benefits and alternatives were discussed Consent given by: patient Patient identity confirmed: verbally with patient Time out: Immediately prior to procedure a "time out" was called to verify the correct patient, procedure, equipment, support staff and site/side marked as required. Comments: US reveals area of hypoechoic fluid 2 CM deep, 1x1 cm diameter, with a tract up to the skin in the area of tenderness and  induration.    (including critical care time) Labs Review Labs Reviewed - No data to display  Imaging Review No results found.   MDM   1. Abscess of breast   2. Cellulitis of breast    I believe she has postop incisional abscess and cellulitis. She is on Keflex for 10 days which is not helping, will add Bactrim to cover MRSA. I have talked to Dr. Towanda Malkin, he will meet this patient in the office tomorrow to treat this problem. His cell phone number is [phone] which he requested to be provided to the patient so that she may call them for an appointment time tomorrow and if she has any more issues. She is in agreement with the plan and will follow up with Dr. Towanda Malkin tomorrow. She is afebrile and nontoxic at this time.  Meds ordered this encounter  Medications  . cefUROXime (CEFTIN) 500 MG tablet    Sig: Take 1 tablet (500 mg total) by mouth 2 (two) times daily with a meal.    Dispense:  28 tablet    Refill:  0    Order Specific Question:  Supervising Provider    Answer:  Billy Fischer [phone]  . sulfamethoxazole-trimethoprim (BACTRIM DS,SEPTRA DS) 800-160 MG per tablet    Sig: Take 2 tablets by mouth 2 (two) times daily.    Dispense:  56 tablet    Refill:  0    Order Specific Question:  Supervising Provider    Answer:  Ihor Gully D [[id number]]      Liam Graham, PA-C 11/07/14 1051

## 2014-11-07 NOTE — Discharge Instructions (Signed)
Dr. Towanda Malkin will meet you in his office tomorrow to treat your abscess. In the meantime, start on these new antibiotics. His phone number is [phone], you may call him if you have any more problems today and also to figure out what time to meet at his office tomorrow.    Abscess An abscess is an infected area that contains a collection of pus and debris.It can occur in almost any part of the body. An abscess is also known as a furuncle or boil. CAUSES  An abscess occurs when tissue gets infected. This can occur from blockage of oil or sweat glands, infection of hair follicles, or a minor injury to the skin. As the body tries to fight the infection, pus collects in the area and creates pressure under the skin. This pressure causes pain. People with weakened immune systems have difficulty fighting infections and get certain abscesses more often.  SYMPTOMS Usually an abscess develops on the skin and becomes a painful mass that is red, warm, and tender. If the abscess forms under the skin, you may feel a moveable soft area under the skin. Some abscesses break open (rupture) on their own, but most will continue to get worse without care. The infection can spread deeper into the body and eventually into the bloodstream, causing you to feel ill.  DIAGNOSIS  Your caregiver will take your medical history and perform a physical exam. A sample of fluid may also be taken from the abscess to determine what is causing your infection. TREATMENT  Your caregiver may prescribe antibiotic medicines to fight the infection. However, taking antibiotics alone usually does not cure an abscess. Your caregiver may need to make a small cut (incision) in the abscess to drain the pus. In some cases, gauze is packed into the abscess to reduce pain and to continue draining the area. HOME CARE INSTRUCTIONS   Only take over-the-counter or prescription medicines for pain, discomfort, or fever as directed by your caregiver.  If  you were prescribed antibiotics, take them as directed. Finish them even if you start to feel better.  If gauze is used, follow your caregiver's directions for changing the gauze.  To avoid spreading the infection:  Keep your draining abscess covered with a bandage.  Wash your hands well.  Do not share personal care items, towels, or whirlpools with others.  Avoid skin contact with others.  Keep your skin and clothes clean around the abscess.  Keep all follow-up appointments as directed by your caregiver. SEEK MEDICAL CARE IF:   You have increased pain, swelling, redness, fluid drainage, or bleeding.  You have muscle aches, chills, or a general ill feeling.  You have a fever. MAKE SURE YOU:   Understand these instructions.  Will watch your condition.  Will get help right away if you are not doing well or get worse. Document Released: 04/05/2005 Document Revised: 12/26/2011 Document Reviewed: 09/08/2011 [facility] Patient Information 2015 [place], [place]. This information is not intended to replace advice given to you by your health care provider. Make sure you discuss any questions you have with your health care provider.  Cellulitis Cellulitis is an infection of the skin and the tissue beneath it. The infected area is usually red and tender. Cellulitis occurs most often in the arms and lower legs.  CAUSES  Cellulitis is caused by bacteria that enter the skin through cracks or cuts in the skin. The most common types of bacteria that cause cellulitis are staphylococci and streptococci. SIGNS AND SYMPTOMS  Redness and warmth.  Swelling.  Tenderness or pain.  Fever. DIAGNOSIS  Your health care provider can usually determine what is wrong based on a physical exam. Blood tests may also be done. TREATMENT  Treatment usually involves taking an antibiotic medicine. HOME CARE INSTRUCTIONS   Take your antibiotic medicine as directed by your health care provider. Finish the  antibiotic even if you start to feel better.  Keep the infected arm or leg elevated to reduce swelling.  Apply a warm cloth to the affected area up to 4 times per day to relieve pain.  Take medicines only as directed by your health care provider.  Keep all follow-up visits as directed by your health care provider. SEEK MEDICAL CARE IF:   You notice red streaks coming from the infected area.  Your red area gets larger or turns dark in color.  Your bone or joint underneath the infected area becomes painful after the skin has healed.  Your infection returns in the same area or another area.  You notice a swollen bump in the infected area.  You develop new symptoms.  You have a fever. SEEK IMMEDIATE MEDICAL CARE IF:   You feel very sleepy.  You develop vomiting or diarrhea.  You have a general ill feeling (malaise) with muscle aches and pains. MAKE SURE YOU:   Understand these instructions.  Will watch your condition.  Will get help right away if you are not doing well or get worse. Document Released: 04/05/2005 Document Revised: 11/10/2013 Document Reviewed: 09/11/2011 [facility] Patient Information 2015 [place], [place]. This information is not intended to replace advice given to you by your health care provider. Make sure you discuss any questions you have with your health care provider.

## 2014-11-07 NOTE — ED Notes (Signed)
Patient has had bilateral breast reduction 3/21.  Patient noticed knot in left breast, 8:00 position-noticed knot on Thursday 4/28. Knot is painful.   Has been in contact with physicians office about drainage from surgical sites and started cephalexin 4/21.

## 2014-11-11 ENCOUNTER — Other Ambulatory Visit: Payer: Self-pay

## 2014-11-12 ENCOUNTER — Telehealth: Payer: Self-pay | Admitting: *Deleted

## 2014-11-12 ENCOUNTER — Encounter: Payer: Self-pay | Admitting: *Deleted

## 2014-11-12 NOTE — Addendum Note (Signed)
Addended by: Leticia Penna A on: 11/12/2014 09:50 AM   Modules accepted: Medications

## 2014-11-12 NOTE — Telephone Encounter (Signed)
Unable to reach patient at time of Pre-Visit Call.  Left message for patient to return call when available.    

## 2014-11-12 NOTE — Telephone Encounter (Signed)
Pre-Visit Call completed with patient and chart updated.   Pre-Visit Info documented in Specialty Comments under Snap[employer].    

## 2014-11-13 ENCOUNTER — Encounter: Payer: Self-pay | Admitting: Internal Medicine

## 2014-11-13 ENCOUNTER — Ambulatory Visit (INDEPENDENT_AMBULATORY_CARE_PROVIDER_SITE_OTHER): Payer: No Typology Code available for payment source | Admitting: Internal Medicine

## 2014-11-13 VITALS — BP 128/76 | HR 77 | Temp 98.1°F | Ht 66.0 in | Wt 301.1 lb

## 2014-11-13 DIAGNOSIS — T814XXA Infection following a procedure, initial encounter: Secondary | ICD-10-CM | POA: Diagnosis not present

## 2014-11-13 DIAGNOSIS — F411 Generalized anxiety disorder: Secondary | ICD-10-CM

## 2014-11-13 DIAGNOSIS — E119 Type 2 diabetes mellitus without complications: Secondary | ICD-10-CM

## 2014-11-13 DIAGNOSIS — T8140XA Infection following a procedure, unspecified, initial encounter: Secondary | ICD-10-CM

## 2014-11-13 DIAGNOSIS — Z0000 Encounter for general adult medical examination without abnormal findings: Secondary | ICD-10-CM | POA: Diagnosis not present

## 2014-11-13 MED ORDER — CANAGLIFLOZIN 100 MG PO TABS: 100.0000 mg | ORAL_TABLET | Freq: Every day | ORAL | Status: DC

## 2014-11-13 MED ORDER — GLUCOSE BLOOD VI STRP: ORAL_STRIP | Status: DC

## 2014-11-13 MED ORDER — ONETOUCH ULTRASOFT LANCETS MISC: Status: DC

## 2014-11-13 NOTE — Progress Notes (Signed)
Pre visit review using our clinic review tool, if applicable. No additional management support is needed unless otherwise documented below in the visit note. 

## 2014-11-13 NOTE — Assessment & Plan Note (Addendum)
Tdap 2014 pnm shot- rec to hold for now, currently w/ a infex and on abx    + family history diabetes, breast cancer and CAD. Never had a cscope Female care per gyn,Dr Benjie Karvonen: recent PAP abnormal, to f/u w/ gyn later this month No recent MMG Labs Diet-exercise discussed

## 2014-11-13 NOTE — Assessment & Plan Note (Addendum)
Good med compliance Glucometer and CBG goals provided

## 2014-11-13 NOTE — Progress Notes (Signed)
Subjective:    Patient ID: [name], female    DOB: [birth year], 49 y.o.   MRN: [id number]  DOS:  11/13/2014 Type of visit - description : cpx Interval history:  Since the last visit, she had breast reduction, subsequently developed a left breast infection. Currently taking 2 antibiotics. Quite frustrated w/ the  issue.   Review of Systems Constitutional: No fever, chills. No unexplained wt changes. No unusual sweats HEENT: No dental problems, ear discharge, facial swelling, voice changes. No eye discharge, redness or intolerance to light Respiratory: No wheezing or difficulty breathing. No cough , mucus production Cardiovascular: No CP, leg swelling or palpitations GI: no nausea, vomiting, diarrhea or abdominal pain.  No blood in the stools. No dysphagia   Endocrine: No polyphagia, polyuria or polydipsia GU: No dysuria, gross hematuria, difficulty urinating. No urinary urgency or frequency. Musculoskeletal: No joint swellings or unusual aches or pains Skin: No change in the color of the skin, palor or rash Allergic, immunologic:   environmental allergies  controlled with Allegra and saline nasal sprays. Neurological: No dizziness or syncope. No headaches. No diplopia, slurred speech, motor deficits, facial numbness Hematological: No enlarged lymph nodes, easy bruising or bleeding Psychiatry: No suicidal ideas, hallucinations, behavior problems or confusion. Very frustrated with her recent surgery and subsequent infection.   Past Medical History  Diagnosis Date  . Obesity   . GERD (gastroesophageal reflux disease)   . Anxiety state, unspecified 05/16/2013  . Sickle cell trait   . Asthma     "only when I get a sinus infection which is 1-2 X/yr" (08/27/2013)  . Diabetes     "borderline" (08/27/2013)  . Migraine     "once or twice/month now" (08/27/2013)  . Arthritis     "right knee" (08/27/2013)  . Hypertension     no meds  . Wears glasses     Past Surgical History    Procedure Laterality Date  . Shoulder arthroscopy w/ rotator cuff repair Right 2006, 2007  . Umbilical hernia repair  2001, 2003  . Wisdom tooth extraction  1985  . Anterior cervical decomp/discectomy fusion  08/27/2013  . Cholecystectomy  06/1989  . Hernia repair    . Knee arthroscopy Left 08/2003  . Tubal ligation  1991  . Anterior cervical decomp/discectomy fusion N/A 08/27/2013    Procedure: ANTERIOR CERVICAL /DISCECTOMY FUSION (ACDF) C5-C7  2 LEVELS;  Surgeon: Melina Schools, MD;  Location: [place];  Service: Orthopedics;  Laterality: N/A;  . Left heart catheterization with coronary angiogram N/A 04/15/2013    Procedure: LEFT HEART CATHETERIZATION WITH CORONARY ANGIOGRAM;  Surgeon: Peter M Martinique, MD;  Location: [facility] C[facility] LAB;  Service: Cardiovascular;  Laterality: N/A;  . Breast reduction surgery Bilateral 09/28/2014    Procedure: MAMMARY REDUCTION  (BREAST) BILATERAL;  Surgeon: Cristine Polio, MD;  Location: [place];  Service: Plastics;  Laterality: Bilateral;  . Liposuction Bilateral 09/28/2014    Procedure: LIPOSUCTION;  Surgeon: Cristine Polio, MD;  Location: [place];  Service: Plastics;  Laterality: Bilateral;   Family History  Problem Relation Age of Onset  . Colon cancer Neg Hx   . Breast cancer Other      GM  . Diabetes Father     F, B, S  . Pancreatic cancer Other     GM  . CAD Sister     MI at age 4  . CAD Brother     CHF, died at age 53  . Heart attack  Brother     died suddenly @ 53  . CAD Father     died of MI @ 8  . Kidney disease Father     History   Social History  . Marital Status: Single    Spouse Name: N/A  . Number of Children: 2  . Years of Education: N/A   Occupational History  . applied for disability, FT student    Social History Main Topics  . Smoking status: Never Smoker   . Smokeless tobacco: Never Used  . Alcohol Use: No  . Drug Use: No  . Sexual Activity: Not Currently    Birth Control/  Protection: None   Other Topics Concern  . Not on file   Social History Narrative   Lives in [place] by herself             Medication List       This list is accurate as of: 11/13/14 11:59 PM.  Always use your most recent med list.               ALPRAZolam 0.25 MG tablet  Commonly known as:  XANAX  Take 1 tablet (0.25 mg total) by mouth 2 (two) times daily as needed for anxiety.     canagliflozin 100 MG Tabs tablet  Commonly known as:  INVOKANA  Take 1 tablet (100 mg total) by mouth daily.     cefUROXime 500 MG tablet  Commonly known as:  CEFTIN  Take 1 tablet (500 mg total) by mouth 2 (two) times daily with a meal.     fexofenadine 180 MG tablet  Commonly known as:  ALLEGRA  Take 180 mg by mouth daily.     glucose blood test strip  Commonly known as:  ONE TOUCH ULTRA TEST  Check blood sugar no more than twice daily.     metFORMIN 1000 MG tablet  Commonly known as:  GLUCOPHAGE  Take 1 tablet (1,000 mg total) by mouth 2 (two) times daily with a meal.     methocarbamol 500 MG tablet  Commonly known as:  ROBAXIN  Take 1 tablet (500 mg total) by mouth every 6 (six) hours as needed for muscle spasms.     onetouch ultrasoft lancets  Check blood sugar no more than twice daily.     sulfamethoxazole-trimethoprim 800-160 MG per tablet  Commonly known as:  BACTRIM DS,SEPTRA DS  Take 2 tablets by mouth 2 (two) times daily.     topiramate 50 MG tablet  Commonly known as:  TOPAMAX  Take 1 tablet (50 mg total) by mouth at bedtime.     TRAMADOL HCL PO  Take 50 mg by mouth as needed.     vitamin B-12 500 MCG tablet  Commonly known as:  CYANOCOBALAMIN  Take 1,000 mcg by mouth daily.           Objective:   Physical Exam BP 128/76 mmHg  Pulse 77  Temp(Src) 98.1 F (36.7 C) (Oral)  Ht 5\' 6"  (1.676 m)  Wt 301 lb 2 oz (136.589 kg)  BMI 48.63 kg/m2  SpO2 97%  LMP 11/05/2014  General:   Well developed, well nourished . NAD.  Neck:  Full range of motion. Supple.  No  Thyromegaly  HEENT:  Normocephalic . Face symmetric, atraumatic Lungs:  CTA B Normal respiratory effort, no intercostal retractions, no accessory muscle use. Heart: RRR,  no murmur.  No pretibial edema bilaterally  Abdomen:  Not distended, soft, non-tender. No rebound or rigidity.  No mass,organomegaly Skin: Exposed areas without rash. Not pale. Not jaundice Neurologic:  alert & oriented X3.  Speech normal, gait appropriate for age and unassisted Strength symmetric and appropriate for age.  Psych: Cognition and judgment appear intact.  Cooperative with normal attention span and concentration.  Behavior appropriate. Tearful during parts of the interview      Assessment & Plan:

## 2014-11-13 NOTE — Patient Instructions (Signed)
Get your blood work before you leave   Diabetes: Check your blood sugar  once a day or at least 4 times a week  Check your blood sugar  at different times of the day  GOALS: Fasting before a meal 70- 130 2 hours after a meal less than 180 At bedtime 90-150 Call if consistently not at goal -  Come back to the office in 3-4 months   for a routine check up

## 2014-11-14 LAB — CBC WITH DIFFERENTIAL/PLATELET
Basophils Absolute: 0 10*3/uL (ref 0.0–0.1)
Basophils Relative: 0 % (ref 0–1)
Eosinophils Absolute: 0.2 10*3/uL (ref 0.0–0.7)
Eosinophils Relative: 3 % (ref 0–5)
HCT: 35.3 % — ABNORMAL LOW (ref 36.0–46.0)
Hemoglobin: 11.6 g/dL — ABNORMAL LOW (ref 12.0–15.0)
Lymphocytes Relative: 30 % (ref 12–46)
Lymphs Abs: 2.4 10*3/uL (ref 0.7–4.0)
MCH: 26.4 pg (ref 26.0–34.0)
MCHC: 32.9 g/dL (ref 30.0–36.0)
MCV: 80.4 fL (ref 78.0–100.0)
MPV: 9.9 fL (ref 8.6–12.4)
Monocytes Absolute: 0.4 10*3/uL (ref 0.1–1.0)
Monocytes Relative: 5 % (ref 3–12)
Neutro Abs: 5 10*3/uL (ref 1.7–7.7)
Neutrophils Relative %: 62 % (ref 43–77)
Platelets: 419 10*3/uL — ABNORMAL HIGH (ref 150–400)
RBC: 4.39 MIL/uL (ref 3.87–5.11)
RDW: 15.7 % — ABNORMAL HIGH (ref 11.5–15.5)
WBC: 8.1 10*3/uL (ref 4.0–10.5)

## 2014-11-14 LAB — LIPID PANEL
Cholesterol: 216 mg/dL — ABNORMAL HIGH (ref 0–200)
HDL: 57 mg/dL (ref >=46)
LDL Cholesterol: 136 mg/dL — ABNORMAL HIGH (ref 0–99)
Total CHOL/HDL Ratio: 3.8 Ratio
Triglycerides: 113 mg/dL (ref <150)
VLDL: 23 mg/dL (ref 0–40)

## 2014-11-14 LAB — HEMOGLOBIN A1C
Hgb A1c MFr Bld: 7.4 % — ABNORMAL HIGH (ref <5.7)
Mean Plasma Glucose: 166 mg/dL — ABNORMAL HIGH (ref <117)

## 2014-11-14 LAB — TSH: TSH: 1.427 u[IU]/mL (ref 0.350–4.500)

## 2014-11-14 LAB — AST: AST: 15 U/L (ref 0–37)

## 2014-11-14 LAB — MICROALBUMIN / CREATININE URINE RATIO
Creatinine, Urine: 72 mg/dL
Microalb Creat Ratio: 26.4 mg/g (ref 0.0–30.0)
Microalb, Ur: 1.9 mg/dL (ref <2.0)

## 2014-11-14 LAB — ALT: ALT: 9 U/L (ref 0–35)

## 2014-11-16 NOTE — Assessment & Plan Note (Signed)
See comments regards to her recent breast surgery

## 2014-11-18 MED ORDER — CANAGLIFLOZIN 300 MG PO TABS: 300.0000 mg | ORAL_TABLET | Freq: Every day | ORAL | Status: DC

## 2014-11-18 NOTE — Addendum Note (Signed)
Addended by: Wilfrid Lund on: 11/18/2014 10:37 AM   Modules accepted: Orders, Medications

## 2014-11-19 ENCOUNTER — Other Ambulatory Visit: Payer: Self-pay

## 2014-11-19 DIAGNOSIS — T8140XA Infection following a procedure, unspecified, initial encounter: Secondary | ICD-10-CM

## 2014-11-19 DIAGNOSIS — Z9889 Other specified postprocedural states: Secondary | ICD-10-CM

## 2014-11-19 NOTE — Addendum Note (Signed)
Addended by: Wilfrid Lund on: 11/19/2014 01:39 PM   Modules accepted: Orders

## 2014-11-26 ENCOUNTER — Encounter: Payer: Self-pay | Admitting: Physical Medicine & Rehabilitation

## 2015-01-05 ENCOUNTER — Emergency Department (HOSPITAL_COMMUNITY)
Admission: EM | Admit: 2015-01-05 | Discharge: 2015-01-05 | Disposition: A | Discharge status: Home / Self Care | Payer: No Typology Code available for payment source | Attending: Emergency Medicine | Admitting: Emergency Medicine

## 2015-01-05 ENCOUNTER — Encounter (HOSPITAL_COMMUNITY): Payer: Self-pay | Admitting: Cardiology

## 2015-01-05 DIAGNOSIS — Z862 Personal history of diseases of the blood and blood-forming organs and certain disorders involving the immune mechanism: Secondary | ICD-10-CM | POA: Insufficient documentation

## 2015-01-05 DIAGNOSIS — F411 Generalized anxiety disorder: Secondary | ICD-10-CM | POA: Insufficient documentation

## 2015-01-05 DIAGNOSIS — N939 Abnormal uterine and vaginal bleeding, unspecified: Secondary | ICD-10-CM | POA: Insufficient documentation

## 2015-01-05 DIAGNOSIS — M1990 Unspecified osteoarthritis, unspecified site: Secondary | ICD-10-CM | POA: Diagnosis not present

## 2015-01-05 DIAGNOSIS — E669 Obesity, unspecified: Secondary | ICD-10-CM | POA: Diagnosis not present

## 2015-01-05 DIAGNOSIS — R531 Weakness: Secondary | ICD-10-CM | POA: Diagnosis not present

## 2015-01-05 DIAGNOSIS — I10 Essential (primary) hypertension: Secondary | ICD-10-CM | POA: Diagnosis not present

## 2015-01-05 DIAGNOSIS — R519 Headache, unspecified: Secondary | ICD-10-CM

## 2015-01-05 DIAGNOSIS — Z8719 Personal history of other diseases of the digestive system: Secondary | ICD-10-CM | POA: Insufficient documentation

## 2015-01-05 DIAGNOSIS — G43909 Migraine, unspecified, not intractable, without status migrainosus: Secondary | ICD-10-CM | POA: Diagnosis not present

## 2015-01-05 DIAGNOSIS — R51 Headache: Secondary | ICD-10-CM

## 2015-01-05 DIAGNOSIS — Z3202 Encounter for pregnancy test, result negative: Secondary | ICD-10-CM | POA: Diagnosis not present

## 2015-01-05 DIAGNOSIS — J45909 Unspecified asthma, uncomplicated: Secondary | ICD-10-CM | POA: Insufficient documentation

## 2015-01-05 DIAGNOSIS — E119 Type 2 diabetes mellitus without complications: Secondary | ICD-10-CM | POA: Insufficient documentation

## 2015-01-05 DIAGNOSIS — Z79899 Other long term (current) drug therapy: Secondary | ICD-10-CM | POA: Diagnosis not present

## 2015-01-05 LAB — I-STAT CHEM 8, ED
BUN: 7 mg/dL (ref 6–20)
Calcium, Ion: 1.14 mmol/L (ref 1.12–1.23)
Chloride: 103 mmol/L (ref 101–111)
Creatinine, Ser: 0.6 mg/dL (ref 0.44–1.00)
Glucose, Bld: 139 mg/dL — ABNORMAL HIGH (ref 65–99)
HCT: 38 % (ref 36.0–46.0)
Hemoglobin: 12.9 g/dL (ref 12.0–15.0)
Potassium: 4 mmol/L (ref 3.5–5.1)
Sodium: 137 mmol/L (ref 135–145)
TCO2: 22 mmol/L (ref 0–100)

## 2015-01-05 LAB — I-STAT BETA HCG BLOOD, ED (MC, WL, AP ONLY): I-stat hCG, quantitative: <5 m[IU]/mL (ref <5)

## 2015-01-05 MED ORDER — KETOROLAC TROMETHAMINE 30 MG/ML IJ SOLN
30.0000 mg | Freq: Once | INTRAMUSCULAR | Status: AC
  Administered 2015-01-05: 30 mg via INTRAVENOUS
  Filled 2015-01-05: qty 1

## 2015-01-05 MED ORDER — SODIUM CHLORIDE 0.9 % IV BOLUS (SEPSIS)
1000.0000 mL | Freq: Once | INTRAVENOUS | Status: AC
  Administered 2015-01-05: 1000 mL via INTRAVENOUS

## 2015-01-05 MED ORDER — PROMETHAZINE HCL 25 MG/ML IJ SOLN
12.5000 mg | Freq: Once | INTRAMUSCULAR | Status: AC
  Administered 2015-01-05: 12.5 mg via INTRAVENOUS
  Filled 2015-01-05: qty 1

## 2015-01-05 MED ORDER — PROCHLORPERAZINE EDISYLATE 5 MG/ML IJ SOLN
10.0000 mg | Freq: Four times a day (QID) | INTRAMUSCULAR | Status: DC | PRN
  Administered 2015-01-05: 10 mg via INTRAVENOUS
  Filled 2015-01-05 (×2): qty 2

## 2015-01-05 NOTE — Discharge Instructions (Signed)

## 2015-01-05 NOTE — ED Notes (Signed)
Pt reports a migraine headache for the past couple of days. Reports she has been having heavy periods but is scheduled to have a hysterectomy July 15. Reports the headaches seem to come with the periods. Light sensitivity but no vision changes.

## 2015-01-05 NOTE — ED Provider Notes (Signed)
CSN: [id number]     Arrival date & time 01/05/15  0902 History   First MD Initiated Contact with Patient 01/05/15 0912     Chief Complaint  Patient presents with  . Migraine  . Vaginal Bleeding     (Consider location/radiation/quality/duration/timing/severity/associated sxs/prior Treatment) Patient is a 49 y.o. female presenting with migraines and vaginal bleeding. The history is provided by the patient.  Migraine This is a recurrent problem. The current episode started in the past 7 days. The problem occurs intermittently. The problem has been gradually worsening. Associated symptoms include headaches and weakness. Pertinent negatives include no abdominal pain, chest pain, coughing, diaphoresis, fever, nausea, numbness, rash or vomiting.  Vaginal Bleeding Associated symptoms: no abdominal pain, no dysuria, no fever and no nausea    [name] is a 49 yo F PMH DM2, migraines, and HTN that is p/w migraines. Pain started on Saturday.  It started in the back of her head/top of her neck and has radiated to her frontal head. Pain is a 7/10. Having photophobia, phonophobia but no blurry vision or auras in her vision. Her headaches have been related to her cycles. She has a hx of migraines. She takes topamax and baclofen but those don't seem to be helping anymore. She hasn't taken anything today for pain.     Having heavy vaginal bleeding for the past month. Since May 14 she has only had 11 days of no bleeding. She hasn't had any bleeding for the past three days. Reports that her colposcopy showed biopsies consistent that were severe in nature so she is scheduled for hysterectomy on July 15. She had a tubal ligation in 2007.  Having some fatigue, HA and vomiting. Denies any blurry vision, chest pain, SOB, nausea, constipation, diarrhea, dysuria, numbness or rash.   Past Medical History  Diagnosis Date  . Obesity   . GERD (gastroesophageal reflux disease)   . Anxiety state, unspecified 05/16/2013  .  Sickle cell trait   . Asthma     "only when I get a sinus infection which is 1-2 X/yr" (08/27/2013)  . Diabetes     "borderline" (08/27/2013)  . Migraine     "once or twice/month now" (08/27/2013)  . Arthritis     "right knee" (08/27/2013)  . Hypertension     no meds  . Wears glasses    Past Surgical History  Procedure Laterality Date  . Shoulder arthroscopy w/ rotator cuff repair Right 2006, 2007  . Umbilical hernia repair  2001, 2003  . Wisdom tooth extraction  1985  . Anterior cervical decomp/discectomy fusion  08/27/2013  . Cholecystectomy  06/1989  . Hernia repair    . Knee arthroscopy Left 08/2003  . Tubal ligation  1991  . Anterior cervical decomp/discectomy fusion N/A 08/27/2013    Procedure: ANTERIOR CERVICAL /DISCECTOMY FUSION (ACDF) C5-C7  2 LEVELS;  Surgeon: Melina Schools, MD;  Location: [place];  Service: Orthopedics;  Laterality: N/A;  . Left heart catheterization with coronary angiogram N/A 04/15/2013    Procedure: LEFT HEART CATHETERIZATION WITH CORONARY ANGIOGRAM;  Surgeon: Peter M Martinique, MD;  [facility] C[facility] LAB;  Service: Cardiovascular;  Laterality: N/A;  . Breast reduction surgery Bilateral 09/28/2014    Procedure: MAMMARY REDUCTION  (BREAST) BILATERAL;  Surgeon: Cristine Polio, MD;  Location: [place];  Service: Plastics;  Laterality: Bilateral;  . Liposuction Bilateral 09/28/2014    Procedure: LIPOSUCTION;  Surgeon: Cristine Polio, MD;  Location: [place];  Service: Plastics;  Laterality:  Bilateral;   Family History  Problem Relation Age of Onset  . Colon cancer Neg Hx   . Breast cancer Other      GM  . Diabetes Father     F, B, S  . Pancreatic cancer Other     GM  . CAD Sister     MI at age 11  . CAD Brother     CHF, died at age 41  . Heart attack Brother     died suddenly @ 21  . CAD Father     died of MI @ 56  . Kidney disease Father    History  Substance Use Topics  . Smoking status: Never Smoker   .  Smokeless tobacco: Never Used  . Alcohol Use: No   OB History    No data available     Review of Systems  Constitutional: Negative for fever and diaphoresis.  Respiratory: Negative for cough.   Cardiovascular: Negative for chest pain.  Gastrointestinal: Negative for nausea, vomiting, abdominal pain, diarrhea and constipation.  Genitourinary: Positive for vaginal bleeding. Negative for dysuria.  Skin: Negative for rash.  Neurological: Positive for weakness and headaches. Negative for numbness.      Allergies  Vicodin  Home Medications   Prior to Admission medications   Medication Sig Start Date End Date Taking? Authorizing Provider  ALPRAZolam (XANAX) 0.25 MG tablet Take 1 tablet (0.25 mg total) by mouth 2 (two) times daily as needed for anxiety. Patient not taking: Reported on 11/13/2014 07/13/14   Colon Branch, MD  canagliflozin [facility]) 300 MG TABS tablet Take 300 mg by mouth daily before breakfast. 11/18/14   Colon Branch, MD  cefUROXime (CEFTIN) 500 MG tablet Take 1 tablet (500 mg total) by mouth 2 (two) times daily with a meal. 11/07/14   Liam Graham, PA-C  fexofenadine (ALLEGRA) 180 MG tablet Take 180 mg by mouth daily.    Historical Provider, MD  glucose blood (ONE TOUCH ULTRA TEST) test strip Check blood sugar no more than twice daily. 11/13/14   Colon Branch, MD  Lancets [facility] ULTRASOFT) lancets Check blood sugar no more than twice daily. 11/13/14   Colon Branch, MD  metFORMIN (GLUCOPHAGE) 1000 MG tablet Take 1 tablet (1,000 mg total) by mouth 2 (two) times daily with a meal. 07/24/14   Colon Branch, MD  methocarbamol (ROBAXIN) 500 MG tablet Take 1 tablet (500 mg total) by mouth every 6 (six) hours as needed for muscle spasms. 07/13/14   Colon Branch, MD  topiramate (TOPAMAX) 50 MG tablet Take 1 tablet (50 mg total) by mouth at bedtime. 07/13/14   Colon Branch, MD  TRAMADOL HCL PO Take 50 mg by mouth as needed.     Historical Provider, MD  vitamin B-12 (CYANOCOBALAMIN) 500 MCG tablet Take  1,000 mcg by mouth daily.     Historical Provider, MD   BP 147/100 mmHg  Pulse 70  Temp(Src) 98.6 F (37 C) (Oral)  Resp 18  Wt 301 lb (136.533 kg)  SpO2 99%  LMP 01/05/2015 Physical Exam  Constitutional: She is oriented to person, place, and time. She appears well-developed and well-nourished.  HENT:  Head: Normocephalic and atraumatic.  Eyes: Conjunctivae and EOM are normal.  Neck: Normal range of motion.  Cardiovascular: Normal rate, regular rhythm, normal heart sounds and intact distal pulses.   No murmur heard. Pulmonary/Chest: Effort normal and breath sounds normal. No respiratory distress. She has no wheezes. She has  no rales.  Abdominal: Soft. Bowel sounds are normal. There is no tenderness. There is no rebound and no guarding.  Musculoskeletal: Normal range of motion. She exhibits no edema.  Neurological: She is alert and oriented to person, place, and time.  Skin: Skin is warm. No rash noted.    ED Course  Procedures (including critical care time) Labs Review Labs Reviewed  I-STAT CHEM 8, ED - Abnormal; Notable for the following:    Glucose, Bld 139 (*)    All other components within normal limits  I-STAT BETA HCG BLOOD, ED ([facility], [facility], [facility] ONLY)    Imaging Review No results found.   EKG Interpretation None      Medications  prochlorperazine (COMPAZINE) injection 10 mg (10 mg Intravenous Given 01/05/15 1032)  sodium chloride 0.9 % bolus 1,000 mL (1,000 mLs Intravenous New Bag/Given 01/05/15 0956)  ketorolac (TORADOL) 30 MG/ML injection 30 mg (30 mg Intravenous Given 01/05/15 1032)  promethazine (PHENERGAN) injection 12.5 mg (12.5 mg Intravenous Given 01/05/15 1040)     MDM   Final diagnoses:  Nonintractable headache, unspecified chronicity pattern, unspecified headache type    [name] is a 49 yo f that is p/w with a headache. She describes it as being similar to her previous migraines. No deficits on exam.  Hgb is stable and CR brisk.  She was given a  migraine cocktail with a NS bolus and her symptoms improved. Patient agreeable with plan and discharge.    Rosemarie Ax, MD PGY-2, [facility] 01/05/2015, 12:36 PM      Rosemarie Ax, MD 01/05/15 [place], MD 01/08/15 0930

## 2015-01-05 NOTE — ED Notes (Signed)
PT ambulated to bathroom. Denies dizziness or lightheadedness. Steady gait.

## 2015-01-13 ENCOUNTER — Encounter (HOSPITAL_COMMUNITY)
Admission: RE | Admit: 2015-01-13 | Discharge: 2015-01-13 | Disposition: A | Discharge status: Home / Self Care | Payer: No Typology Code available for payment source | Source: Ambulatory Visit | Attending: Obstetrics & Gynecology | Admitting: Obstetrics & Gynecology

## 2015-01-13 ENCOUNTER — Encounter (HOSPITAL_COMMUNITY): Payer: Self-pay

## 2015-01-13 ENCOUNTER — Other Ambulatory Visit: Payer: Self-pay | Admitting: Obstetrics & Gynecology

## 2015-01-13 DIAGNOSIS — D069 Carcinoma in situ of cervix, unspecified: Secondary | ICD-10-CM | POA: Diagnosis not present

## 2015-01-13 DIAGNOSIS — Z01818 Encounter for other preprocedural examination: Secondary | ICD-10-CM | POA: Insufficient documentation

## 2015-01-13 LAB — CBC
HCT: 36 % (ref 36.0–46.0)
Hemoglobin: 12.1 g/dL (ref 12.0–15.0)
MCH: 26.2 pg (ref 26.0–34.0)
MCHC: 33.6 g/dL (ref 30.0–36.0)
MCV: 78.1 fL (ref 78.0–100.0)
Platelets: 354 10*3/uL (ref 150–400)
RBC: 4.61 MIL/uL (ref 3.87–5.11)
RDW: 16.3 % — ABNORMAL HIGH (ref 11.5–15.5)
WBC: 10.6 10*3/uL — ABNORMAL HIGH (ref 4.0–10.5)

## 2015-01-13 LAB — BASIC METABOLIC PANEL
Anion gap: 9 (ref 5–15)
BUN: 8 mg/dL (ref 6–20)
CO2: 22 mmol/L (ref 22–32)
Calcium: 9.2 mg/dL (ref 8.9–10.3)
Chloride: 108 mmol/L (ref 101–111)
Creatinine, Ser: 0.84 mg/dL (ref 0.44–1.00)
GFR calc Af Amer: >60 mL/min (ref >60)
GFR calc non Af Amer: >60 mL/min (ref >60)
Glucose, Bld: 161 mg/dL — ABNORMAL HIGH (ref 65–99)
Potassium: 3.6 mmol/L (ref 3.5–5.1)
Sodium: 139 mmol/L (ref 135–145)

## 2015-01-13 NOTE — Patient Instructions (Addendum)
Your procedure is scheduled on:  January 22, 2015  Enter through the Main Entrance of [facility] at: 11:00 am   Pick up the phone at the desk and dial [phone].  Call this number if you have problems the morning of surgery: [phone]  Remember: Do NOT eat food: after midnight on Thursday   Do NOT drink clear liquids after:  Midnight on Thursday  Take these medicines the morning of surgery with a SIP OF WATER:  None  DO NOT TAKE METFORMIN FOR 24 HOURS PRIOR TO SURGERY   Do NOT wear jewelry (body piercing), metal hair clips/bobby pins, make-up, or nail polish. Do NOT wear lotions, powders, or perfumes.  You may wear deoderant. Do NOT shave for 48 hours prior to surgery. Do NOT bring valuables to the hospital. Leave suitcase in car.  After surgery it may be brought to your room.  For patients admitted to the hospital, checkout time is 11:00 AM the day of discharge.

## 2015-01-18 ENCOUNTER — Telehealth: Payer: Self-pay | Admitting: Internal Medicine

## 2015-01-18 ENCOUNTER — Ambulatory Visit (HOSPITAL_BASED_OUTPATIENT_CLINIC_OR_DEPARTMENT_OTHER): Payer: No Typology Code available for payment source | Admitting: Physical Medicine & Rehabilitation

## 2015-01-18 ENCOUNTER — Encounter: Payer: Self-pay | Admitting: Physical Medicine & Rehabilitation

## 2015-01-18 ENCOUNTER — Encounter: Payer: No Typology Code available for payment source | Attending: Physical Medicine & Rehabilitation

## 2015-01-18 VITALS — BP 156/83 | HR 81 | Resp 14

## 2015-01-18 DIAGNOSIS — M5136 Other intervertebral disc degeneration, lumbar region: Secondary | ICD-10-CM

## 2015-01-18 DIAGNOSIS — G894 Chronic pain syndrome: Secondary | ICD-10-CM | POA: Diagnosis not present

## 2015-01-18 DIAGNOSIS — M47816 Spondylosis without myelopathy or radiculopathy, lumbar region: Secondary | ICD-10-CM | POA: Diagnosis not present

## 2015-01-18 DIAGNOSIS — R51 Headache: Principal | ICD-10-CM

## 2015-01-18 DIAGNOSIS — D571 Sickle-cell disease without crisis: Secondary | ICD-10-CM | POA: Diagnosis not present

## 2015-01-18 DIAGNOSIS — E669 Obesity, unspecified: Secondary | ICD-10-CM | POA: Diagnosis not present

## 2015-01-18 DIAGNOSIS — M545 Low back pain, unspecified: Secondary | ICD-10-CM | POA: Insufficient documentation

## 2015-01-18 DIAGNOSIS — Z8489 Family history of other specified conditions: Secondary | ICD-10-CM | POA: Diagnosis not present

## 2015-01-18 DIAGNOSIS — M5127 Other intervertebral disc displacement, lumbosacral region: Secondary | ICD-10-CM | POA: Insufficient documentation

## 2015-01-18 DIAGNOSIS — G8929 Other chronic pain: Secondary | ICD-10-CM | POA: Insufficient documentation

## 2015-01-18 DIAGNOSIS — Z803 Family history of malignant neoplasm of breast: Secondary | ICD-10-CM | POA: Diagnosis not present

## 2015-01-18 DIAGNOSIS — I10 Essential (primary) hypertension: Secondary | ICD-10-CM | POA: Diagnosis not present

## 2015-01-18 DIAGNOSIS — E119 Type 2 diabetes mellitus without complications: Secondary | ICD-10-CM | POA: Insufficient documentation

## 2015-01-18 DIAGNOSIS — M25569 Pain in unspecified knee: Secondary | ICD-10-CM | POA: Insufficient documentation

## 2015-01-18 DIAGNOSIS — M961 Postlaminectomy syndrome, not elsewhere classified: Secondary | ICD-10-CM | POA: Diagnosis not present

## 2015-01-18 DIAGNOSIS — K219 Gastro-esophageal reflux disease without esophagitis: Secondary | ICD-10-CM | POA: Diagnosis not present

## 2015-01-18 DIAGNOSIS — R519 Headache, unspecified: Secondary | ICD-10-CM

## 2015-01-18 DIAGNOSIS — M549 Dorsalgia, unspecified: Secondary | ICD-10-CM | POA: Diagnosis present

## 2015-01-18 DIAGNOSIS — Z8249 Family history of ischemic heart disease and other diseases of the circulatory system: Secondary | ICD-10-CM | POA: Diagnosis not present

## 2015-01-18 DIAGNOSIS — Z800 Family history of malignant neoplasm of digestive organs: Secondary | ICD-10-CM | POA: Insufficient documentation

## 2015-01-18 MED ORDER — TOPIRAMATE 50 MG PO TABS: 50.0000 mg | ORAL_TABLET | Freq: Every day | ORAL | Status: DC

## 2015-01-18 MED ORDER — TRAMADOL HCL 50 MG PO TABS: 100.0000 mg | ORAL_TABLET | Freq: Three times a day (TID) | ORAL | Status: DC

## 2015-01-18 NOTE — Patient Instructions (Addendum)
We discussed that your pain is likely due to a combination of disc problems as well as arthritis of the small joints in the back. The injection will address the arthritis in the small joints of the back. I do recommend continued weight loss. Our goal is to increase your activity level as well as reduce her pain. We discussed that pain medications will not get rid of all the pain in fact on average maybe about one third

## 2015-01-18 NOTE — Telephone Encounter (Signed)
Please advise 

## 2015-01-18 NOTE — Progress Notes (Signed)
Subjective:    Patient ID: [name], female    DOB: 1965/12/27, 49 y.o.   MRN: [id number]  HPI 48 year old female with chief complaint of low back pain which radiates to the legs Patient reports injury in 2014 where she fell down the steps. She injured her neck as well as her right knee and her low back. She was taken out of work for this injury. She eventually underwent C5-C6 and C6-C7 ACDF by Dr. Rolena Infante on 08/27/2013. Postoperatively she has had some neck pain, however her arm numbness and arm pain improved. In terms of her back she underwent left L3 selective nerve root block. She had an MRI in October 2015 demonstrating some disc bulging eccentric to the left at L3-4. Patient had no significant stenosis at that level. There was some arthritic changes at L3-4. Selective nerve root block was beneficial for only 10 days. Patient discussed spine surgery with her orthopedic spine surgeon however did not want to pursue this until trying other options. Currently taking tramadol 1 tablet 2 or 3 times per day.  In regards to right knee pain patient was diagnosed with a strain, her pain did improve over time. Patient ambulates with a cane, complains that left knee occasionally buckles. The cane has helped maintain her balance.  No bowel or bladder issues related to back.  Opioid risk score 0, PH Q9 score is 6  Pain Inventory Average Pain 6 Pain Right Now 7 My pain is sharp and stabbing  In the last 24 hours, has pain interfered with the following? General activity 4 Relation with others 0 Enjoyment of life 4 What TIME of day is your pain at its worst? daytime Sleep (in general) Poor  Pain is worse with: walking, sitting and standing Pain improves with: rest, heat/ice and medication Relief from Meds: 2  Mobility walk with assistance use a cane how many minutes can you walk? 10 ability to climb steps?  yes do you drive?  yes  Function not employed: date last employed . I  need assistance with the following:  household duties  Neuro/Psych spasms  Prior Studies new visit  Physicians involved in your care new visit   Family History  Problem Relation Age of Onset  . Colon cancer Neg Hx   . Breast cancer Other      GM  . Diabetes Father     F, B, S  . Pancreatic cancer Other     GM  . CAD Sister     MI at age 85  . CAD Brother     CHF, died at age 33  . Heart attack Brother     died suddenly @ 83  . CAD Father     died of MI @ 20  . Kidney disease Father    History   Social History  . Marital Status: Divorced    Spouse Name: N/A  . Number of Children: 2  . Years of Education: N/A   Occupational History  . applied for disability, FT student    Social History Main Topics  . Smoking status: Never Smoker   . Smokeless tobacco: Never Used  . Alcohol Use: No  . Drug Use: No  . Sexual Activity: Not Currently    Birth Control/ Protection: None   Other Topics Concern  . None   Social History Narrative   Lives in [place] by herself        Past Surgical History  Procedure Laterality Date  .  Shoulder arthroscopy w/ rotator cuff repair Right 2006, 2007  . Umbilical hernia repair  2001, 2003  . Wisdom tooth extraction  1985  . Anterior cervical decomp/discectomy fusion  08/27/2013  . Cholecystectomy  06/1989  . Hernia repair    . Knee arthroscopy Left 08/2003  . Tubal ligation  1991  . Anterior cervical decomp/discectomy fusion N/A 08/27/2013    Procedure: ANTERIOR CERVICAL /DISCECTOMY FUSION (ACDF) C5-C7  2 LEVELS;  Surgeon: Melina Schools, MD;  Location: [facility];  Service: Orthopedics;  Laterality: N/A;  . Left heart catheterization with coronary angiogram N/A 04/15/2013    Procedure: LEFT HEART CATHETERIZATION WITH CORONARY ANGIOGRAM;  Surgeon: Peter M Martinique, MD;  Location: [facility] CATH LAB;  Service: Cardiovascular;  Laterality: N/A;  . Breast reduction surgery Bilateral 09/28/2014    Procedure: MAMMARY REDUCTION  (BREAST) BILATERAL;  Surgeon:  Cristine Polio, MD;  Location: [facility];  Service: Plastics;  Laterality: Bilateral;  . Liposuction Bilateral 09/28/2014    Procedure: LIPOSUCTION;  Surgeon: Cristine Polio, MD;  Location: [place];  Service: Plastics;  Laterality: Bilateral;  . Breast surgery    . Dilation and curettage of uterus     Past Medical History  Diagnosis Date  . Obesity   . GERD (gastroesophageal reflux disease)   . Anxiety state, unspecified 05/16/2013  . Sickle cell trait   . Asthma     "only when I get a sinus infection which is 1-2 X/yr" (08/27/2013)  . Diabetes     "borderline" (08/27/2013)  . Migraine     "once or twice/month now" (08/27/2013)  . Arthritis     "right knee" (08/27/2013)  . Hypertension     no meds  . Wears glasses    BP 156/83 mmHg  Pulse 81  Resp 14  SpO2 98%  LMP 01/05/2015  Opioid Risk Score:   Fall Risk Score: Moderate Fall Risk (6-13 points) (patient given educational material during todays visit)`1  Depression screen PHQ 2/9  Depression screen [facility] 2/9 01/18/2015 02/14/2013  Decreased Interest 0 0  Down, Depressed, Hopeless 0 0  PHQ - 2 Score 0 0  Altered sleeping 2 -  Tired, decreased energy 2 -  Change in appetite 2 -  Feeling bad or failure about yourself  0 -  Trouble concentrating 0 -  Moving slowly or fidgety/restless 0 -  Suicidal thoughts 0 -  PHQ-9 Score 6 -     Review of Systems  Constitutional:       Night sweats  Neurological:       Spasms  All other systems reviewed and are negative.      Objective:   Physical Exam  Constitutional: She is oriented to person, place, and time. She appears well-developed and well-nourished.  HENT:  Head: Normocephalic and atraumatic.  Right Ear: External ear normal.  Left Ear: External ear normal.  Eyes: Conjunctivae and EOM are normal. Pupils are equal, round, and reactive to light.  Neck:  Reduced neck range of motion extension is the most limited and painful at end  range. Flexion is full, lateral bending and rotation 75%. No tenderness to palpation along the cervical paraspinal muscles.  Cardiovascular: Normal rate, regular rhythm and normal heart sounds.   Pulmonary/Chest: Effort normal and breath sounds normal.  Abdominal: Soft. Bowel sounds are normal.  Musculoskeletal:       Right hip: Normal.       Left hip: Normal.       Right knee: Normal.  Left knee: Normal.  Tenderness to palpation bilateral L3 and L4 paraspinal muscles area worse on the left side. There is reduced lumbar range of motion with flexion and extension and lateral rotation and bending. No evidence of scoliosis but there is exaggerated lumbar lordosis.  Neurological: She is alert and oriented to person, place, and time. She displays atrophy. She exhibits normal muscle tone. Gait abnormal. Coordination normal.  Reflex Scores:      Tricep reflexes are 2+ on the right side and 2+ on the left side.      Bicep reflexes are 2+ on the right side and 2+ on the left side.      Brachioradialis reflexes are 2+ on the right side and 2+ on the left side.      Patellar reflexes are 0 on the right side and 0 on the left side.      Achilles reflexes are 0 on the right side and 0 on the left side. Motor strength is 5/5 bilateral deltoids, biceps, triceps, grip, hip flexor, knee extensor, ankle dorsiflexor and plantar flexor  Ambulates without toe drag or knee instability. Uses straight cane.  Psychiatric: She has a normal mood and affect. Her behavior is normal.  Nursing note and vitals reviewed.  Negative straight leg raise Sensation intact bilateral C5 C6 C7 C8 L3 L4 L5 S1 dermatomal distribution.       Assessment & Plan:  1. Chronic low back pain. Likely a combination of lumbar degenerative disc as well as lumbar spondylosis at L3-L4 area. No signs of radiculopathy however patient did have some relief of her pain after left L3 selective nerve root block. In order to determine whether  the lumbar facet arthropathy is contributing to low back pain, recommend left-sided lumbar medial branch blocks L2-3-4 5. We discussed the procedure as well as postprocedure instructions.  We also discussed medication management. She does have an allergy or at least an intolerance to hydrocodone. She does tolerate tramadol however does not feel she gets sufficient relief for one tablet 3 times per day. She is not on any SSRIs therefore we can boost The tramadol to 100 mg 3 times a day  2. Cervical postlaminectomy syndrome no signs of radiculopathy. Does have some limited range of motion particularly with extension. May have some cervical facet mediated pain as well. May benefit from cervical medial branch blocks if this worsens.  3. Chronic knee pain does have some valgus deformity. I suspect she may have some underlying early osteoarthritis. Currently this is not a major complaint. We'll monitor may need to get repeat imaging studies and possibly try ultrasound guided corticosteroid injections given her weight

## 2015-01-18 NOTE — Telephone Encounter (Signed)
Caller name: Beonka Amesquita Relationship to patient: self Can be reached: [phone]  Reason for call: Pt states [facility] but [facility] no [facility] her insurance. She said that she needs referral to someone else for headache management for her to continue her topamax. Please notify pt when referral is placed.

## 2015-01-18 NOTE — Telephone Encounter (Signed)
Referral placed to Neurology. Topamax #90 tabs sent to pharmacy.

## 2015-01-18 NOTE — Telephone Encounter (Signed)
Okay to refill of Topamax for 3 months if needed, arrange a neurology referral

## 2015-01-20 ENCOUNTER — Encounter: Payer: Self-pay | Admitting: Neurology

## 2015-01-20 ENCOUNTER — Ambulatory Visit (INDEPENDENT_AMBULATORY_CARE_PROVIDER_SITE_OTHER): Payer: No Typology Code available for payment source | Admitting: Neurology

## 2015-01-20 VITALS — BP 148/98 | HR 81 | Resp 16 | Ht 65.0 in | Wt 300.0 lb

## 2015-01-20 DIAGNOSIS — G8929 Other chronic pain: Secondary | ICD-10-CM

## 2015-01-20 DIAGNOSIS — G43109 Migraine with aura, not intractable, without status migrainosus: Secondary | ICD-10-CM | POA: Insufficient documentation

## 2015-01-20 DIAGNOSIS — M549 Dorsalgia, unspecified: Secondary | ICD-10-CM

## 2015-01-20 MED ORDER — BACLOFEN 10 MG PO TABS: ORAL_TABLET | ORAL | Status: DC

## 2015-01-20 MED ORDER — TOPIRAMATE 50 MG PO TABS: 50.0000 mg | ORAL_TABLET | Freq: Every day | ORAL | Status: DC

## 2015-01-20 NOTE — Progress Notes (Signed)
NEUROLOGY CONSULTATION NOTE  ALAHNI VARONE MRN: [id number] DOB: 08/28/1966  Referring provider: Dr. Kathlene November Primary care provider: Dr. Kathlene November  Reason for consult:  migraines  Dear Dr Larose Kells:  Thank you for your kind referral of DEB LOUDIN for consultation of the above symptoms. Although her history is well known to you, please allow me to reiterate it for the purpose of our medical record.Records and images were personally reviewed where available.  HISTORY OF PRESENT ILLNESS: This is a pleasant 49 year old right-handed woman with a history of morbid obesity, hypertension, diabetes, chronic back pain, and migraines since age 33, presenting to establish care for her migraines. Migraines usually start in the back of her head then radiate to the frontal regions, with 10/10 sharp, stabbing, and throbbing pain. There is associated nausea, vomiting, photo and phonophobia, neck pain, as well as occasional dizziness and blurred vision. At times, migraines are preceded by flashing lights. She was having them once a month, however since May 2016, headaches increased in frequency to twice a week. She had found that they would occur more a day or two prior to her cycle. Since April, she has noticed a change in her menstrual period, and is scheduled for a total hysterectomy this Friday. Other triggers include stress and weather changes. She has been taking Topamax for the past 10 years, increased to 50mg  daily last year. She had been seeing neurologist Dr. Domingo Cocking [facility] until they stopped taking her insurance. She ran out of Topamax this week, and has had a 6/10 migraine for the past couple of days. Prior to this, her last migraine was the end of June. She had been receiving trigger point injections in the past, which did help with headaches, but after one session where she had severe neck pain, she stopped this. She does not recall other preventatives tried, but recalls they  made her sleep (she mentions ?Valium). Imitrex helped little. Dr. Domingo Cocking had prescribed prn Baclofen for migraine rescue, which takes the edge off. She has been taking this twice a week but ran out as well. Last night she took Advil. She takes Tramadol for her left sciatica. She tries to get 8 hours of sleep. There is no family history of migraines. She denies any significant head injuries. She denies any diplopia, dysarthria, dysphagia, bowel/bladder dysfunction. She has occasional numbness and tingling in both arms, mainly in the left arm.  I personally reviewed head CT done 09/2011 which was normal.   PAST MEDICAL HISTORY: Past Medical History  Diagnosis Date  . Obesity   . GERD (gastroesophageal reflux disease)   . Anxiety state, unspecified 05/16/2013  . Sickle cell trait   . Asthma     "only when I get a sinus infection which is 1-2 X/yr" (08/27/2013)  . Diabetes     "borderline" (08/27/2013)  . Migraine     "once or twice/month now" (08/27/2013)  . Arthritis     "right knee" (08/27/2013)  . Hypertension     no meds  . Wears glasses     PAST SURGICAL HISTORY: Past Surgical History  Procedure Laterality Date  . Shoulder arthroscopy w/ rotator cuff repair Right 2006, 2007  . Umbilical hernia repair  2001, 2003  . Wisdom tooth extraction  1985  . Anterior cervical decomp/discectomy fusion  08/27/2013  . Cholecystectomy  06/1989  . Hernia repair    . Knee arthroscopy Left 08/2003  . Tubal ligation  1991  .  Anterior cervical decomp/discectomy fusion N/A 08/27/2013    Procedure: ANTERIOR CERVICAL /DISCECTOMY FUSION (ACDF) C5-C7  2 LEVELS;  Surgeon: [place]ina Schools, MD;  Location: [place];  Service: Orthopedics;  Laterality: N/A;  . Left heart catheterization with coronary angiogram N/A 04/15/2013    Procedure: LEFT HEART CATHETERIZATION WITH CORONARY ANGIOGRAM;  Surgeon: Peter M Martinique, MD;  Location: [facility] CATH LAB;  Service: Cardiovascular;  Laterality: N/A;  . Breast reduction surgery  Bilateral 09/28/2014    Procedure: MAMMARY REDUCTION  (BREAST) BILATERAL;  Surgeon: Cristine Polio, MD;  Location: [place];  Service: Plastics;  Laterality: Bilateral;  . Liposuction Bilateral 09/28/2014    Procedure: LIPOSUCTION;  Surgeon: Cristine Polio, MD;  Location: [facility];  Service: Plastics;  Laterality: Bilateral;  . Breast surgery    . Dilation and curettage of uterus      MEDICATIONS: Current Outpatient Prescriptions on File Prior to Visit  Medication Sig Dispense Refill  . Ascorbic Acid (VITAMIN C) 100 MG tablet Take 100 mg by mouth 2 (two) times daily.     . canagliflozin (INVOKANA) 300 MG TABS tablet Take 300 mg by mouth daily before breakfast. 30 tablet 3  . fexofenadine (ALLEGRA) 180 MG tablet Take 180 mg by mouth daily.    . folic acid (FOLVITE) 1 MG tablet Take 1 mg by mouth daily.    . metFORMIN (GLUCOPHAGE) 1000 MG tablet Take 1 tablet (1,000 mg total) by mouth 2 (two) times daily with a meal. 60 tablet 10  . topiramate (TOPAMAX) 50 MG tablet Take 1 tablet (50 mg total) by mouth at bedtime. 90 tablet 0  . traMADol (ULTRAM) 50 MG tablet Take 2 tablets (100 mg total) by mouth 3 (three) times daily between meals. (Patient taking differently: Take 2 tablets 3 times a day as needed for pain) 180 tablet 1  . vitamin B-12 (CYANOCOBALAMIN) 500 MCG tablet Take 1,000 mcg by mouth daily.      No current facility-administered medications on file prior to visit.    ALLERGIES: Allergies  Allergen Reactions  . Vicodin [Hydrocodone-Acetaminophen] Swelling and Rash    FAMILY HISTORY: Family History  Problem Relation Age of Onset  . Colon cancer Neg Hx   . Breast cancer Other      GM  . Diabetes Father     F, B, S  . Pancreatic cancer Other     GM  . CAD Sister     MI at age 53  . CAD Brother     CHF, died at age 74  . Heart attack Brother     died suddenly @ 37  . CAD Father     died of MI @ 75  . Kidney disease Father      SOCIAL HISTORY: History   Social History  . Marital Status: Divorced    Spouse Name: N/A  . Number of Children: 2  . Years of Education: N/A   Occupational History  . applied for disability, FT student    Social History Main Topics  . Smoking status: Never Smoker   . Smokeless tobacco: Never Used  . Alcohol Use: No  . Drug Use: No  . Sexual Activity: Not Currently    Birth Control/ Protection: None   Other Topics Concern  . Not on file   Social History Narrative   Lives in [place] by herself         REVIEW OF SYSTEMS: Constitutional: No fevers, chills, or sweats, no generalized fatigue,  change in appetite Eyes: No visual changes, double vision, eye pain Ear, nose and throat: No hearing loss, ear pain, nasal congestion, sore throat Cardiovascular: No chest pain, palpitations Respiratory:  No shortness of breath at rest or with exertion, wheezes GastrointestinaI: No nausea, vomiting, diarrhea, abdominal pain, fecal incontinence Genitourinary:  No dysuria, urinary retention or frequency Musculoskeletal:  + neck pain, back pain Integumentary: No rash, pruritus, skin lesions Neurological: as above Psychiatric: No depression, insomnia, anxiety Endocrine: No palpitations, fatigue, diaphoresis, mood swings, change in appetite, change in weight, increased thirst Hematologic/Lymphatic:  No anemia, purpura, petechiae. Allergic/Immunologic: no itchy/runny eyes, nasal congestion, recent allergic reactions, rashes  PHYSICAL EXAM: Filed Vitals:   01/20/15 0810  BP: 148/98  Pulse: 81  Resp: 16   General: No acute distress Head:  Normocephalic/atraumatic Eyes: Fundoscopic exam shows bilateral sharp discs, no vessel changes, exudates, or hemorrhages Neck: supple, no paraspinal tenderness, full range of motion Back: No paraspinal tenderness Heart: regular rate and rhythm Lungs: Clear to auscultation bilaterally. Vascular: No carotid bruits. Skin/Extremities: No rash, no  edema Neurological Exam: Mental status: alert and oriented to person, place, and time, no dysarthria or aphasia, Fund of knowledge is appropriate.  Recent and remote memory are intact.  Attention and concentration are normal.    Able to name objects and repeat phrases. Cranial nerves: CN I: not tested CN II: pupils equal, round and reactive to light, visual fields intact, fundi unremarkable. CN III, IV, VI:  full range of motion, no nystagmus, no ptosis CN V: facial sensation intact CN VII: upper and lower face symmetric CN VIII: hearing intact to finger rub CN IX, X: gag intact, uvula midline CN XI: sternocleidomastoid and trapezius muscles intact CN XII: tongue midline Bulk & Tone: normal, no fasciculations. Motor: 5/5 throughout with no pronator drift. Sensation: intact to light touch, cold, pin, vibration and joint position sense.  No extinction to double simultaneous stimulation.  Romberg test negative Deep Tendon Reflexes: +1 throughout, no ankle clonus Plantar responses: downgoing bilaterally Cerebellar: no incoordination on finger to nose, heel to shin. No dysdiadochokinesia Gait: slow and cautious, walks with limp due to heel spur on right foot and sciatica on left  Tremor: none  IMPRESSION: This is a 49 year old right-handed woman with a history of morbid obesity, hypertension, diabetes, chronic back pain, and migraines since age 48, presenting to establish care for migraines. She has noticed an increase in migraines with changes in her menstrual cycle last April, and is scheduled for a hysterectomy this Friday. We discussed continuation of Topamax 50mg  daily for now, and monitor if headaches reduce in frequency after hysterectomy, we may increase dose of Topamax in the future if headaches are unchanged. Continue prn Baclofen for headache rescue. She knows to minimize rescue medications to 2-3 a week to avoid rebound headaches. She will keep a headache calendar and follow-up in 3  months.   Thank you for allowing me to participate in the care of this patient. Please do not hesitate to call for any questions or concerns.   Ellouise Newer, M.D.  CC: Dr. Larose Kells

## 2015-01-20 NOTE — Patient Instructions (Signed)
1. Continue Topamax 50mg  daily for headache prevention 2. Take Baclofen 10mg  as needed at onset of migraine 3. Minimize intake of any rescue medication (Aleve, Ibuprofen, Tylenol) to 2-3 a week to avoid rebound headaches 4. Keep a calendar of your headaches 5. We wish you well with the upcoming surgery 6. Follow-up in 3 months

## 2015-01-21 MED ORDER — DEXTROSE 5 % IV SOLN
3.0000 g | INTRAVENOUS | Status: AC
  Administered 2015-01-22 (×2): 3 g via INTRAVENOUS
  Filled 2015-01-21: qty 3000

## 2015-01-21 NOTE — Anesthesia Preprocedure Evaluation (Addendum)
Anesthesia Evaluation  Patient identified by MRN, date of birth, ID band Patient awake    Reviewed: Allergy & Precautions, NPO status , Patient's Chart, lab work & pertinent test results  Airway Mallampati: III  TM Distance: >3 FB Neck ROM: Full    Dental   Pulmonary asthma ,    Pulmonary exam normal       Cardiovascular hypertension, Pt. on medications Normal cardiovascular exam '14 cath: no coronary disease '07 ECHO: EF 55-65%, valves OK   Neuro/Psych  Headaches,    GI/Hepatic GERD-  Medicated,  Endo/Other  diabetes, Well Controlled, Type 2, Oral Hypoglycemic AgentsMorbid obesity  Renal/GU      Musculoskeletal   Abdominal   Peds  Hematology  (+) Sickle cell trait ,   Anesthesia Other Findings NPO appropriate, allergies reviewed Denies active cardiac or pulmonary symptoms, METS > 4 No recent congestive cough or symptoms of upper respiratory infection Meds - baclofen PRN for migraine, topamax for migraine ppx, oral diabetic medication    Reproductive/Obstetrics                            Anesthesia Physical  Anesthesia Plan  ASA: III  Anesthesia Plan: General   Post-op Pain Management:    Induction: Intravenous  Airway Management Planned: Oral ETT  Additional Equipment:   Intra-op Plan:   Post-operative Plan: Extubation in OR  Informed Consent: I have reviewed the patients History and Physical, chart, labs and discussed the procedure including the risks, benefits and alternatives for the proposed anesthesia with the patient or authorized representative who has indicated his/her understanding and acceptance.   Dental advisory given  Plan Discussed with: CRNA and Anesthesiologist  Anesthesia Plan Comments:         Anesthesia Quick Evaluation

## 2015-01-22 ENCOUNTER — Ambulatory Visit (HOSPITAL_COMMUNITY): Payer: No Typology Code available for payment source | Admitting: Anesthesiology

## 2015-01-22 ENCOUNTER — Observation Stay (HOSPITAL_COMMUNITY)
Admission: RE | Admit: 2015-01-22 | Discharge: 2015-01-23 | Disposition: A | Discharge status: Home / Self Care | Payer: No Typology Code available for payment source | Source: Ambulatory Visit | Attending: Obstetrics & Gynecology | Admitting: Obstetrics & Gynecology

## 2015-01-22 ENCOUNTER — Encounter (HOSPITAL_COMMUNITY)
Admission: RE | Disposition: A | Discharge status: Home / Self Care | Payer: Self-pay | Source: Ambulatory Visit | Attending: Obstetrics & Gynecology

## 2015-01-22 DIAGNOSIS — N921 Excessive and frequent menstruation with irregular cycle: Secondary | ICD-10-CM | POA: Diagnosis not present

## 2015-01-22 DIAGNOSIS — Z90710 Acquired absence of both cervix and uterus: Secondary | ICD-10-CM | POA: Diagnosis present

## 2015-01-22 DIAGNOSIS — N7011 Chronic salpingitis: Secondary | ICD-10-CM | POA: Diagnosis not present

## 2015-01-22 DIAGNOSIS — F419 Anxiety disorder, unspecified: Secondary | ICD-10-CM | POA: Insufficient documentation

## 2015-01-22 DIAGNOSIS — I10 Essential (primary) hypertension: Secondary | ICD-10-CM | POA: Diagnosis not present

## 2015-01-22 DIAGNOSIS — D573 Sickle-cell trait: Secondary | ICD-10-CM | POA: Insufficient documentation

## 2015-01-22 DIAGNOSIS — N841 Polyp of cervix uteri: Secondary | ICD-10-CM | POA: Insufficient documentation

## 2015-01-22 DIAGNOSIS — D060 Carcinoma in situ of endocervix: Secondary | ICD-10-CM | POA: Diagnosis not present

## 2015-01-22 DIAGNOSIS — K219 Gastro-esophageal reflux disease without esophagitis: Secondary | ICD-10-CM | POA: Diagnosis not present

## 2015-01-22 DIAGNOSIS — G43909 Migraine, unspecified, not intractable, without status migrainosus: Secondary | ICD-10-CM | POA: Diagnosis not present

## 2015-01-22 DIAGNOSIS — Z8041 Family history of malignant neoplasm of ovary: Secondary | ICD-10-CM | POA: Insufficient documentation

## 2015-01-22 DIAGNOSIS — Z885 Allergy status to narcotic agent status: Secondary | ICD-10-CM | POA: Diagnosis not present

## 2015-01-22 DIAGNOSIS — Z6841 Body Mass Index (BMI) 40.0 and over, adult: Secondary | ICD-10-CM | POA: Insufficient documentation

## 2015-01-22 DIAGNOSIS — E119 Type 2 diabetes mellitus without complications: Secondary | ICD-10-CM | POA: Diagnosis not present

## 2015-01-22 DIAGNOSIS — K6389 Other specified diseases of intestine: Secondary | ICD-10-CM | POA: Diagnosis not present

## 2015-01-22 DIAGNOSIS — Z79899 Other long term (current) drug therapy: Secondary | ICD-10-CM | POA: Diagnosis not present

## 2015-01-22 DIAGNOSIS — J45909 Unspecified asthma, uncomplicated: Secondary | ICD-10-CM | POA: Diagnosis not present

## 2015-01-22 DIAGNOSIS — D069 Carcinoma in situ of cervix, unspecified: Secondary | ICD-10-CM | POA: Diagnosis present

## 2015-01-22 DIAGNOSIS — M1711 Unilateral primary osteoarthritis, right knee: Secondary | ICD-10-CM | POA: Insufficient documentation

## 2015-01-22 DIAGNOSIS — E669 Obesity, unspecified: Secondary | ICD-10-CM | POA: Insufficient documentation

## 2015-01-22 DIAGNOSIS — N800 Endometriosis of uterus: Secondary | ICD-10-CM | POA: Diagnosis not present

## 2015-01-22 DIAGNOSIS — D251 Intramural leiomyoma of uterus: Secondary | ICD-10-CM | POA: Diagnosis not present

## 2015-01-22 HISTORY — PX: ROBOTIC ASSISTED TOTAL HYSTERECTOMY: SHX6085

## 2015-01-22 HISTORY — PX: LAPAROSCOPIC BILATERAL SALPINGECTOMY: SHX5889

## 2015-01-22 LAB — PREGNANCY, URINE: Preg Test, Ur: NEGATIVE

## 2015-01-22 LAB — TYPE AND SCREEN
ABO/RH(D): A POS
Antibody Screen: NEGATIVE

## 2015-01-22 LAB — GLUCOSE, CAPILLARY
Glucose-Capillary: 87 mg/dL (ref 65–99)
Glucose-Capillary: 127 mg/dL — ABNORMAL HIGH (ref 65–99)

## 2015-01-22 LAB — ABO/RH: ABO/RH(D): A POS

## 2015-01-22 SURGERY — ROBOTIC ASSISTED TOTAL HYSTERECTOMY
Anesthesia: General

## 2015-01-22 MED ORDER — CLONIDINE HCL 0.1 MG PO TABS
0.1000 mg | ORAL_TABLET | ORAL | Status: AC
  Administered 2015-01-22: 0.1 mg via ORAL
  Filled 2015-01-22: qty 1

## 2015-01-22 MED ORDER — METFORMIN HCL 500 MG PO TABS
1000.0000 mg | ORAL_TABLET | Freq: Two times a day (BID) | ORAL | Status: DC
  Administered 2015-01-22 – 2015-01-23 (×2): 1000 mg via ORAL
  Filled 2015-01-22 (×4): qty 2

## 2015-01-22 MED ORDER — ROPIVACAINE HCL 5 MG/ML IJ SOLN
INTRAMUSCULAR | Status: AC
  Filled 2015-01-22: qty 60

## 2015-01-22 MED ORDER — FENTANYL CITRATE (PF) 100 MCG/2ML IJ SOLN
INTRAMUSCULAR | Status: AC
  Filled 2015-01-22: qty 2

## 2015-01-22 MED ORDER — CEFAZOLIN SODIUM-DEXTROSE 2-3 GM-% IV SOLR
INTRAVENOUS | Status: AC
  Filled 2015-01-22: qty 50

## 2015-01-22 MED ORDER — FENTANYL CITRATE (PF) 250 MCG/5ML IJ SOLN
INTRAMUSCULAR | Status: AC
  Filled 2015-01-22: qty 5

## 2015-01-22 MED ORDER — TOPIRAMATE 25 MG PO TABS
50.0000 mg | ORAL_TABLET | Freq: Every day | ORAL | Status: DC
  Administered 2015-01-22: 50 mg via ORAL
  Filled 2015-01-22 (×2): qty 2

## 2015-01-22 MED ORDER — MENTHOL 3 MG MT LOZG
1.0000 | LOZENGE | OROMUCOSAL | Status: DC | PRN
Start: 2015-01-22 — End: 2015-01-23
  Filled 2015-01-22: qty 9

## 2015-01-22 MED ORDER — SCOPOLAMINE 1 MG/3DAYS TD PT72
1.0000 | MEDICATED_PATCH | Freq: Once | TRANSDERMAL | Status: DC
  Administered 2015-01-22: 1.5 mg via TRANSDERMAL

## 2015-01-22 MED ORDER — SODIUM CHLORIDE 0.9 % IJ SOLN
INTRAMUSCULAR | Status: AC
  Filled 2015-01-22: qty 10

## 2015-01-22 MED ORDER — LIDOCAINE HCL (CARDIAC) 20 MG/ML IV SOLN
INTRAVENOUS | Status: AC
  Filled 2015-01-22: qty 5

## 2015-01-22 MED ORDER — ARTIFICIAL TEARS OP OINT
TOPICAL_OINTMENT | OPHTHALMIC | Status: AC
  Filled 2015-01-22: qty 3.5

## 2015-01-22 MED ORDER — HEPARIN SODIUM (PORCINE) 5000 UNIT/ML IJ SOLN
INTRAMUSCULAR | Status: DC | PRN
  Administered 2015-01-22: 5000 [IU]

## 2015-01-22 MED ORDER — SCOPOLAMINE 1 MG/3DAYS TD PT72
MEDICATED_PATCH | TRANSDERMAL | Status: AC
  Administered 2015-01-22: 1.5 mg via TRANSDERMAL
  Filled 2015-01-22: qty 1

## 2015-01-22 MED ORDER — FENTANYL CITRATE (PF) 100 MCG/2ML IJ SOLN
INTRAMUSCULAR | Status: AC
  Administered 2015-01-22: 25 ug via INTRAVENOUS
  Filled 2015-01-22: qty 2

## 2015-01-22 MED ORDER — NEOSTIGMINE METHYLSULFATE 10 MG/10ML IV SOLN
INTRAVENOUS | Status: DC | PRN
  Administered 2015-01-22: 4 mg via INTRAVENOUS

## 2015-01-22 MED ORDER — HEPARIN SODIUM (PORCINE) 5000 UNIT/ML IJ SOLN
INTRAMUSCULAR | Status: AC
  Filled 2015-01-22: qty 1

## 2015-01-22 MED ORDER — ONDANSETRON HCL 4 MG/2ML IJ SOLN: 4.0000 mg | Freq: Four times a day (QID) | INTRAMUSCULAR | Status: DC | PRN

## 2015-01-22 MED ORDER — KETOROLAC TROMETHAMINE 30 MG/ML IJ SOLN: 30.0000 mg | Freq: Once | INTRAMUSCULAR | Status: DC

## 2015-01-22 MED ORDER — PHENYLEPHRINE 40 MCG/ML (10ML) SYRINGE FOR IV PUSH (FOR BLOOD PRESSURE SUPPORT)
PREFILLED_SYRINGE | INTRAVENOUS | Status: AC
  Filled 2015-01-22: qty 20

## 2015-01-22 MED ORDER — BUPIVACAINE HCL (PF) 0.25 % IJ SOLN
INTRAMUSCULAR | Status: AC
  Filled 2015-01-22: qty 60

## 2015-01-22 MED ORDER — ROCURONIUM BROMIDE 100 MG/10ML IV SOLN
INTRAVENOUS | Status: AC
  Filled 2015-01-22: qty 1

## 2015-01-22 MED ORDER — FENTANYL CITRATE (PF) 100 MCG/2ML IJ SOLN
25.0000 ug | INTRAMUSCULAR | Status: DC | PRN
  Administered 2015-01-22 (×2): 25 ug via INTRAVENOUS

## 2015-01-22 MED ORDER — BACLOFEN 10 MG PO TABS
10.0000 mg | ORAL_TABLET | Freq: Every day | ORAL | Status: DC | PRN
  Filled 2015-01-22: qty 1

## 2015-01-22 MED ORDER — FENTANYL CITRATE (PF) 100 MCG/2ML IJ SOLN
INTRAMUSCULAR | Status: DC | PRN
  Administered 2015-01-22: 75 ug via INTRAVENOUS
  Administered 2015-01-22: 100 ug via INTRAVENOUS
  Administered 2015-01-22: 50 ug via INTRAVENOUS
  Administered 2015-01-22 (×2): 100 ug via INTRAVENOUS
  Administered 2015-01-22: 25 ug via INTRAVENOUS

## 2015-01-22 MED ORDER — GLYCOPYRROLATE 0.2 MG/ML IJ SOLN
INTRAMUSCULAR | Status: DC | PRN
  Administered 2015-01-22: 0.6 mg via INTRAVENOUS

## 2015-01-22 MED ORDER — LACTATED RINGERS IR SOLN
Status: DC | PRN
  Administered 2015-01-22: 3000 mL

## 2015-01-22 MED ORDER — SUCCINYLCHOLINE CHLORIDE 20 MG/ML IJ SOLN
INTRAMUSCULAR | Status: AC
  Filled 2015-01-22: qty 1

## 2015-01-22 MED ORDER — PHENYLEPHRINE HCL 10 MG/ML IJ SOLN
INTRAMUSCULAR | Status: DC | PRN
  Administered 2015-01-22 (×3): 80 mg via INTRAVENOUS
  Administered 2015-01-22 (×2): 40 mg via INTRAVENOUS
  Administered 2015-01-22: 80 mg via INTRAVENOUS

## 2015-01-22 MED ORDER — SODIUM CHLORIDE 0.9 % IV SOLN
INTRAVENOUS | Status: DC | PRN
  Administered 2015-01-22: 120 mL

## 2015-01-22 MED ORDER — SUCCINYLCHOLINE CHLORIDE 20 MG/ML IJ SOLN
INTRAMUSCULAR | Status: DC | PRN
  Administered 2015-01-22: 130 mg via INTRAVENOUS

## 2015-01-22 MED ORDER — ARTIFICIAL TEARS OP OINT
TOPICAL_OINTMENT | OPHTHALMIC | Status: DC | PRN
  Administered 2015-01-22: 1 via OPHTHALMIC

## 2015-01-22 MED ORDER — LACTATED RINGERS IV SOLN
INTRAVENOUS | Status: DC
  Administered 2015-01-22 (×2): via INTRAVENOUS

## 2015-01-22 MED ORDER — PROMETHAZINE HCL 25 MG/ML IJ SOLN: 6.2500 mg | INTRAMUSCULAR | Status: DC | PRN

## 2015-01-22 MED ORDER — MIDAZOLAM HCL 2 MG/2ML IJ SOLN
INTRAMUSCULAR | Status: AC
  Filled 2015-01-22: qty 2

## 2015-01-22 MED ORDER — PROPOFOL 10 MG/ML IV BOLUS
INTRAVENOUS | Status: DC | PRN
  Administered 2015-01-22: 300 mg via INTRAVENOUS

## 2015-01-22 MED ORDER — MIDAZOLAM HCL 2 MG/2ML IJ SOLN
INTRAMUSCULAR | Status: DC | PRN
  Administered 2015-01-22: 2 mg via INTRAVENOUS

## 2015-01-22 MED ORDER — HYDROMORPHONE HCL 1 MG/ML IJ SOLN
INTRAMUSCULAR | Status: DC | PRN
  Administered 2015-01-22: 1 mg via INTRAVENOUS

## 2015-01-22 MED ORDER — SODIUM CHLORIDE 0.9 % IV SOLN
INTRAVENOUS | Status: AC
  Administered 2015-01-22: 21:00:00 via INTRAVENOUS

## 2015-01-22 MED ORDER — SODIUM CHLORIDE 0.9 % IJ SOLN
INTRAMUSCULAR | Status: AC
  Filled 2015-01-22: qty 50

## 2015-01-22 MED ORDER — OXYCODONE-ACETAMINOPHEN 5-325 MG PO TABS
1.0000 | ORAL_TABLET | ORAL | Status: DC | PRN
  Administered 2015-01-22: 1 via ORAL
  Administered 2015-01-23: 2 via ORAL
  Administered 2015-01-23: 1 via ORAL
  Filled 2015-01-22: qty 1
  Filled 2015-01-22: qty 2
  Filled 2015-01-22: qty 1

## 2015-01-22 MED ORDER — ONDANSETRON HCL 4 MG/2ML IJ SOLN
INTRAMUSCULAR | Status: DC | PRN
  Administered 2015-01-22: 4 mg via INTRAVENOUS

## 2015-01-22 MED ORDER — CANAGLIFLOZIN 300 MG PO TABS
300.0000 mg | ORAL_TABLET | Freq: Every day | ORAL | Status: DC
  Administered 2015-01-23: 300 mg via ORAL
  Filled 2015-01-22 (×2): qty 300

## 2015-01-22 MED ORDER — HYDROMORPHONE HCL 1 MG/ML IJ SOLN
INTRAMUSCULAR | Status: AC
  Filled 2015-01-22: qty 1

## 2015-01-22 MED ORDER — METOCLOPRAMIDE HCL 5 MG/ML IJ SOLN
INTRAMUSCULAR | Status: AC
  Filled 2015-01-22: qty 2

## 2015-01-22 MED ORDER — PROPOFOL 10 MG/ML IV BOLUS
INTRAVENOUS | Status: AC
  Filled 2015-01-22: qty 40

## 2015-01-22 MED ORDER — ROCURONIUM BROMIDE 100 MG/10ML IV SOLN
INTRAVENOUS | Status: DC | PRN
  Administered 2015-01-22: 10 mg via INTRAVENOUS
  Administered 2015-01-22: 20 mg via INTRAVENOUS
  Administered 2015-01-22: 50 mg via INTRAVENOUS
  Administered 2015-01-22: 20 mg via INTRAVENOUS

## 2015-01-22 MED ORDER — ONDANSETRON HCL 4 MG PO TABS: 4.0000 mg | ORAL_TABLET | Freq: Four times a day (QID) | ORAL | Status: DC | PRN

## 2015-01-22 MED ORDER — LIDOCAINE HCL (CARDIAC) 20 MG/ML IV SOLN
INTRAVENOUS | Status: DC | PRN
  Administered 2015-01-22: 100 mg via INTRAVENOUS

## 2015-01-22 SURGICAL SUPPLY — 73 items
BARRIER ADHS 3X4 INTERCEED (GAUZE/BANDAGES/DRESSINGS) IMPLANT
CABLE HIGH FREQUENCY MONO STRZ (ELECTRODE) IMPLANT
CATH FOLEY 3WAY  5CC 16FR (CATHETERS) ×1
CATH FOLEY 3WAY 5CC 16FR (CATHETERS) ×2 IMPLANT
CATH ROBINSON RED A/P 16FR (CATHETERS) IMPLANT
CHLORAPREP W/TINT 26ML (MISCELLANEOUS) ×3 IMPLANT
CLOTH BEACON ORANGE TIMEOUT ST (SAFETY) ×3 IMPLANT
CONT PATH 16OZ SNAP LID 3702 (MISCELLANEOUS) ×3 IMPLANT
COVER BACK TABLE 60X90IN (DRAPES) ×6 IMPLANT
COVER TIP SHEARS 8 DVNC (MISCELLANEOUS) ×2 IMPLANT
COVER TIP SHEARS 8MM DA VINCI (MISCELLANEOUS) ×1
DECANTER SPIKE VIAL GLASS SM (MISCELLANEOUS) ×3 IMPLANT
DRAPE WARM FLUID 44X44 (DRAPE) ×3 IMPLANT
DRESSING OPSITE X SMALL 2X3 (GAUZE/BANDAGES/DRESSINGS) ×3 IMPLANT
DRSG COVADERM PLUS 2X2 (GAUZE/BANDAGES/DRESSINGS) ×6 IMPLANT
DRSG OPSITE POSTOP 3X4 (GAUZE/BANDAGES/DRESSINGS) ×3 IMPLANT
DURAPREP 26ML APPLICATOR (WOUND CARE) ×3 IMPLANT
ELECT REM PT RETURN 9FT ADLT (ELECTROSURGICAL) ×3
ELECTRODE REM PT RTRN 9FT ADLT (ELECTROSURGICAL) ×2 IMPLANT
EVACUATOR SMOKE 8.L (FILTER) IMPLANT
FORCEPS CUTTING 33CM 5MM (CUTTING FORCEPS) IMPLANT
FORCEPS CUTTING 45CM 5MM (CUTTING FORCEPS) IMPLANT
GAUZE VASELINE 3X9 (GAUZE/BANDAGES/DRESSINGS) IMPLANT
GLOVE BIO SURGEON STRL SZ7 (GLOVE) ×6 IMPLANT
GLOVE BIOGEL PI IND STRL 7.0 (GLOVE) ×8 IMPLANT
GLOVE BIOGEL PI INDICATOR 7.0 (GLOVE) ×4
GLOVE ECLIPSE 6.5 STRL STRAW (GLOVE) ×9 IMPLANT
GOWN STRL REUS W/TWL LRG LVL3 (GOWN DISPOSABLE) ×9 IMPLANT
HEMOSTAT SURGICEL 2X14 (HEMOSTASIS) ×3 IMPLANT
KIT ACCESSORY DA VINCI DISP (KITS) ×1
KIT ACCESSORY DVNC DISP (KITS) ×2 IMPLANT
LEGGING LITHOTOMY PAIR STRL (DRAPES) ×3 IMPLANT
LIQUID BAND (GAUZE/BANDAGES/DRESSINGS) ×3 IMPLANT
MANIPULATOR UTERINE 4.5 ZUMI (MISCELLANEOUS) ×3 IMPLANT
NEEDLE INSUFFLATION 120MM (ENDOMECHANICALS) ×3 IMPLANT
NS IRRIG 1000ML POUR BTL (IV SOLUTION) ×3 IMPLANT
OCCLUDER COLPOPNEUMO (BALLOONS) ×3 IMPLANT
PACK LAPAROSCOPY BASIN (CUSTOM PROCEDURE TRAY) ×3 IMPLANT
PACK ROBOT WH (CUSTOM PROCEDURE TRAY) ×3 IMPLANT
PACK ROBOTIC GOWN (GOWN DISPOSABLE) ×3 IMPLANT
PAD POSITIONER PINK NONSTERILE (MISCELLANEOUS) ×3 IMPLANT
PAD PREP 24X48 CUFFED NSTRL (MISCELLANEOUS) ×6 IMPLANT
POUCH SPECIMEN RETRIEVAL 10MM (ENDOMECHANICALS) IMPLANT
PROTECTOR NERVE ULNAR (MISCELLANEOUS) ×3 IMPLANT
SCISSORS LAP 5X35 DISP (ENDOMECHANICALS) ×3 IMPLANT
SET CYSTO W/LG BORE CLAMP LF (SET/KITS/TRAYS/PACK) ×3 IMPLANT
SET IRRIG TUBING LAPAROSCOPIC (IRRIGATION / IRRIGATOR) ×3 IMPLANT
SET TRI-LUMEN FLTR TB AIRSEAL (TUBING) ×3 IMPLANT
SOLUTION ELECTROLUBE (MISCELLANEOUS) IMPLANT
SUT VICRYL 0 27 CT2 27 ABS (SUTURE) IMPLANT
SUT VICRYL 0 UR6 27IN ABS (SUTURE) ×3 IMPLANT
SUT VICRYL 4-0 PS2 18IN ABS (SUTURE) ×12 IMPLANT
SUT VLOC 180 0 9IN  GS21 (SUTURE) ×1
SUT VLOC 180 0 9IN GS21 (SUTURE) ×2 IMPLANT
SYR 30ML LL (SYRINGE) ×3 IMPLANT
SYR 50ML LL SCALE MARK (SYRINGE) ×3 IMPLANT
SYSTEM CONVERTIBLE TROCAR (TROCAR) ×3 IMPLANT
TIP RUMI ORANGE 6.7MMX12CM (TIP) IMPLANT
TIP UTERINE 5.1X6CM LAV DISP (MISCELLANEOUS) IMPLANT
TIP UTERINE 6.7X10CM GRN DISP (MISCELLANEOUS) ×3 IMPLANT
TIP UTERINE 6.7X6CM WHT DISP (MISCELLANEOUS) IMPLANT
TIP UTERINE 6.7X8CM BLUE DISP (MISCELLANEOUS) IMPLANT
TOWEL OR 17X24 6PK STRL BLUE (TOWEL DISPOSABLE) ×6 IMPLANT
TRAY FOLEY BAG SILVER LF 16FR (SET/KITS/TRAYS/PACK) ×3 IMPLANT
TROCAR 12M 150ML BLUNT (TROCAR) ×3 IMPLANT
TROCAR BALLN 12MMX100 BLUNT (TROCAR) IMPLANT
TROCAR DISP BLADELESS 8 DVNC (TROCAR) ×2 IMPLANT
TROCAR DISP BLADELESS 8MM (TROCAR) ×1
TROCAR PORT AIRSEAL 5X120 (TROCAR) IMPLANT
TROCAR XCEL NON-BLD 11X100MML (ENDOMECHANICALS) ×3 IMPLANT
TROCAR XCEL NON-BLD 5MMX100MML (ENDOMECHANICALS) ×3 IMPLANT
WARMER LAPAROSCOPE (MISCELLANEOUS) ×3 IMPLANT
WATER STERILE IRR 1000ML POUR (IV SOLUTION) ×9 IMPLANT

## 2015-01-22 NOTE — Op Note (Signed)
Preoperative diagnosis:  Menometrorrhagia, Severe cervical dysplasia- CIN-3  Postop diagnosis: Same Procedure: da Vinci robot assisted total laparoscopic hysterectomy and bilateral salpingectomy, Pelvic washing, Lysis of adhesions  Anesthesia Gen. Endotracheal Surgeon: Dr. Azucena Fallen Assistant: Jeanett Schlein, CNM IV fluids: 1300 cc LR EBL: 200 cc Urine output: 250 cc, clear in foley Complications: none Pathology: Uterus with cervix and both fallopian tubes, pelvic washings and 2 peritoneal nodules Disposition: PACU, stable Findings: Enlarged uterus sounded to 11 cm, right lower lateral myoma, normal ovaries and tubes. Normal omentum, liver, appendix. Extensive right side adhesions from right of the umbilicus to lateral wall. Normal ureteral flow on inspection.   Procedure:  Indication: -- 49 yo female, with CIN-3 and menometrorrhagia with HTN, DM, Obesity, prior cholecystectomy, tubal sterilization and ventral hernia repair x 2. Office endometrial biopsy was benign. Pap smear was HSIL and cervical biopsy CIN-3. Patient chose to have hysterectomy. FamilyHx of ovarian cancer, so plan to perform pelvic washings.  Complications of surgery including infection, bleeding, damage to internal organs and other surgery related problems including pneumonia, VTE reviewed and informed written consent was obtained. She understood and gave informed written consent. Patient was brought to the operating room with IV running. She received 3 gm Ancef. She. Underwent general anesthesia without difficulty and was given dorsal lithotomy position very carefully securing arms/ legs/ shoulders to prevent any nerve compression due to body habitus and possibly long surgery. She was prepped and draped in sterile fashion. Foley catheter was placed. Cervix was exposed with a speculum and anterior lip of the cervix was grasped with tenaculum. Cervical os was dilated and uterus was sounded to 11cm. A  # 10 Rumi tip and a large  Koh ring was assembled on the [employer] and entered in the uterine cavity and balloon was inflated to secure it in place. Koh ring was palpated again cervico-vaginal junction. Speculum was removed, tenaculum was left on the cervix.   Attention was focused on abdomen. Supraumbilical 12 mm vertical incision made with scalpel after injecting Ropivacaine, fascia dissected, grasped with Kocher's and incised, posterior rectus sheath and peritoneum grasped, incised, intraabdominal entry confirmed. Extensive adhesions palpated at the entry and mainly to the right side. Purse string stay stitch on 0-Vicryl taken on fascia and Hassan cannula introduced and Vicryl sutures secured on the Hassan cannula. Pneumoperitoneum was begun. Laparoscope was introduced and the peritoneal cavity as evaluated. There was evidence of dense omental adhesions and also bowel adhesions in mid to upper abdomen from umbilicus up on the right. Trendelenburg position given.  Port sited marked and injected with Ropivacaine. Only one Robotic cannula was inserted on right lower side since that area was clear. One robotic cannula placed on the left lower side and assistant port placed on left side below the level of umbilicus medial to the lower one under vision. #8 laparoscope was switched and inserted from the left lower port and endoscissors from other left upper port and lysis of omental adhesions performed, hemostasis was excellent. Now a second right port medial and higher to right outer lower port was able to be placed under vision since it was cleared of its adhesions. Robot was docked from right side. PK, Scissors and Prograsp inserted and secured to arms. Pelvic washing performed.  Dr. Benjie Karvonen scrubbed out and went for surgical console.   Uterus, ovaries, tubes and ureters evaluated. Uterus was bulky and noted a right lower fibroid bulging into the broad ligament. Both the ovaries appeared normal. Tubes with evidence of  partial  salpingectomy. Omentum, bowel, liver normal. Two small yellow nodules noted in the cul de sac and were removed and sent for pathology separately but were soft on exam.    Uterus was deviated to the patient's right. Left mesosalpinx was desiccated and cut until cornual attachment of the tube. Left round ligament was desiccated and cut. Left utero-ovarian ligament was desiccated and cut. Anterior and posterior broad ligament leaves were incised and anterior bladder flap created with sharp and blunt dissection with good hemostasis. Uterine vessels were skeletonized and desiccated but not cut. Uterus was deviated to the left and right tube grasped, broad ligament was desiccated and cut and segment of that tube was removed to be sent with the rest of the pathology specimen. Right round ligament was desiccated and cut. Right utero-ovarian ligament was desiccated and cut. Anterior and posterior leaves of broad ligament were cut. Anterior right lower fibroid was noted bulging in the broad ligament. During dissection there was brink bleeding from fibroid vessel and was clamped, cauterized. Right uterine vessels were desiccated and cut and hemostasis controlled.  Koh ring impression at cervicovaginal junction was seen well anteriorly. Vaginal occluder was inflated and colpotomy was begun from the anterior part moving to the left, further desiccating and cutting left uterine vessels. Colpotomy was completed circumferentially staying above uteroscaral insertion. Uterus, cervix, tubes were pulled out of the vaginal opening and vaginal occluder was placed in the vagina to maintain pneumoperitoneum.  Vaginal cut edges were evaluated for hemostasis needed to be controlled with PK and hot scissors. Irrigation was performed pedicles appeared dry.  Robotic instruments switched for needle driver and long tip tissue forceps. 0 VeeLock used for suturing vaginal cuff which began from right angle up to the left angle in running sutures  and back to the right in 2 layers. Needle was removed. Hemostasis was excellent, irrigation performed. Slight oozing noted from bladder dissection area, Surgicel piece placed on the area. Ureters appeared normal with normal peristalsis.   Robotic instruments were removed. Robot was de-docked. Lap'scope was reintroduced, hemostasis was excellent. Surgicel removed. All cannulas were removed under vision. Laparoscope and central port removed under vision after aspirating pneumoperitoneum. The stay sutures at the fascia tied together with excellent fascial closure. Skin approximated with subcuticular stitches on 4-0 Vicryl. Dermabond was applied. Vaginal occluder removed. No vaginal bleeding noted on vaginal exam at the end. All instruments/lap/sponges counts were correct x2.  No complications. Patient tolerated procedure well and was reversed from anesthesia and brought to the PACU stable condition.   Dr Benjie Karvonen was the surgeon for entire case.   Manon Hilding, MD

## 2015-01-22 NOTE — Transfer of Care (Signed)
Immediate Anesthesia Transfer of Care Note  Patient: [name]  Procedure(s) Performed: Procedure(s): ROBOTIC ASSISTED TOTAL HYSTERECTOMY With Pelvic Washings, Lysis of Adhesions (N/A) LAPAROSCOPIC BILATERAL SALPINGECTOMY (Bilateral)  Patient Location: PACU  Anesthesia Type:General  Level of Consciousness: awake, alert  and oriented  Airway & Oxygen Therapy: Patient Spontanous Breathing and Patient connected to nasal cannula oxygen  Post-op Assessment: Report given to RN, Post -op Vital signs reviewed and stable and Patient moving all extremities  Post vital signs: Reviewed and stable  Last Vitals:  Filed Vitals:   01/22/15 1113  BP: 156/100  Pulse: 85  Temp: 36.8 C  Resp: 18    Complications: No apparent anesthesia complications

## 2015-01-22 NOTE — Anesthesia Procedure Notes (Signed)
Procedure Name: Intubation Date/Time: 01/22/2015 12:38 PM Performed by: Jonna Munro Pre-anesthesia Checklist: Patient identified, Emergency Drugs available, Suction available, Patient being monitored and Timeout performed Patient Re-evaluated:Patient Re-evaluated prior to inductionOxygen Delivery Method: Circle system utilized Preoxygenation: Pre-oxygenation with 100% oxygen Intubation Type: IV induction and Rapid sequence Laryngoscope Size: Glidescope and 4 Grade View: Grade I Tube type: Oral Tube size: 7.0 mm Number of attempts: 1 Airway Equipment and Method: Patient positioned with wedge pillow,  Video-laryngoscopy and Stylet Placement Confirmation: ETT inserted through vocal cords under direct vision,  positive ETCO2 and breath sounds checked- equal and bilateral Secured at: 21 cm Tube secured with: Tape Dental Injury: Teeth and Oropharynx as per pre-operative assessment

## 2015-01-22 NOTE — Anesthesia Postprocedure Evaluation (Signed)
  Anesthesia Post-op Note  Patient: [name]  Procedure(s) Performed: Procedure(s): ROBOTIC ASSISTED TOTAL HYSTERECTOMY With Pelvic Washings, Lysis of Adhesions (N/A) LAPAROSCOPIC BILATERAL SALPINGECTOMY (Bilateral) Patient is awake and responsive. Pain and nausea are reasonably well controlled. Vital signs are stable and clinically acceptable. Oxygen saturation is clinically acceptable. There are no apparent anesthetic complications at this time. Patient is ready for discharge.

## 2015-01-22 NOTE — H&P (Addendum)
[name] is an 49 y.o. female here for hysterectomy due to severe cervical dysplasia (CIN III) and heavy prolonged menses. Patient declined LEEP/ [facility] procedure and preferred definitive therapy for CIN III especially with abnormal menses. She is taking Aygestin at present to reduce menses.  Obesity, DM (pt thinks she is Pre-DM, but her A1C from May'16 is 7.4), HTN. Asthma, Migraine, Ventral hernia with mesh. S/p Tubal ligation.  FamHx of Ovarian cancer- sister. Diagnosed at Autopsy when she died of stroke.  No LMP recorded.    Past Medical History  Diagnosis Date  . Obesity   . GERD (gastroesophageal reflux disease)   . Anxiety state, unspecified 05/16/2013  . Sickle cell trait   . Asthma     "only when I get a sinus infection which is 1-2 X/yr" (08/27/2013)  . Diabetes     "borderline" (08/27/2013)  . Migraine     "once or twice/month now" (08/27/2013)  . Arthritis     "right knee" (08/27/2013)  . Hypertension     no meds  . Wears glasses     Past Surgical History  Procedure Laterality Date  . Shoulder arthroscopy w/ rotator cuff repair Right 2006, 2007  . Umbilical hernia repair  2001, 2003  . Wisdom tooth extraction  1985  . Anterior cervical decomp/discectomy fusion  08/27/2013  . Cholecystectomy  06/1989  . Hernia repair    . Knee arthroscopy Left 08/2003  . Tubal ligation  1991  . Anterior cervical decomp/discectomy fusion N/A 08/27/2013    Procedure: ANTERIOR CERVICAL /DISCECTOMY FUSION (ACDF) C5-C7  2 LEVELS;  Surgeon: Melina Schools, MD;  Location: [place];  Service: Orthopedics;  Laterality: N/A;  . Left heart catheterization with coronary angiogram N/A 04/15/2013    Procedure: LEFT HEART CATHETERIZATION WITH CORONARY ANGIOGRAM;  Surgeon: Peter M Martinique, MD;  Location: [facility] CATH LAB;  Service: Cardiovascular;  Laterality: N/A;  . Breast reduction surgery Bilateral 09/28/2014    Procedure: MAMMARY REDUCTION  (BREAST) BILATERAL;  Surgeon: Cristine Polio, MD;  Location: [place];  Service: Plastics;  Laterality: Bilateral;  . Liposuction Bilateral 09/28/2014    Procedure: LIPOSUCTION;  Surgeon: Cristine Polio, MD;  Location: [place];  Service: Plastics;  Laterality: Bilateral;  . Breast surgery    . Dilation and curettage of uterus      Family History  Problem Relation Age of Onset  . Colon cancer Neg Hx   . Breast cancer Other      GM  . Diabetes Father     F, B, S  . Pancreatic cancer Other     GM  . CAD Sister     MI at age 35  . CAD Brother     CHF, died at age 64  . Heart attack Brother     died suddenly @ 73  . CAD Father     died of MI @ 33  . Kidney disease Father     Social History:  reports that she has never smoked. She has never used smokeless tobacco. She reports that she does not drink alcohol or use illicit drugs.  Allergies:  Allergies  Allergen Reactions  . Vicodin [Hydrocodone-Acetaminophen] Swelling and Rash    No prescriptions prior to admission    ROSmenstrual bleeding   There were no vitals taken for this visit. Physical Exam  A&O x 3, no acute distress. Pleasant. Obesity HEENT neg, no thyromegaly Lungs CTA bilat CV RRR, S1S2 normal Abdo soft,  non tender, non acute Extr no edema/ tenderness Pelvic 8 week uterus, no adnexal masses  No results found for this or any previous visit (from the past 24 hour(s)).  No results found.  Assessment/Plan: 49 yo female with menometrorrhagia and CIN III on cervical biopsy. Here for robot assisted total laparoscopic hysterectomy and bilateral salpingectomy. Peritoneal washings, possible bilateral oophorectomy.  FamHx of ovarian cancer- sister, incidental finding at autopsy performed when she died of stroke.   Reviewed extensively with patient risks and benefits of bilateral oophorectomy at this age in light of medical problems and famhx.  Pt has HTN, DM and obesity and strong family hx of CVD and CAD. She is more likely to benefit from  endogenous estrogen by keeping ovaries rather than surgical menopause and possibly going on ERT for famHx of ovarian cancer since bilateral oophorectomy will reduce that risk but not primary peritoneal cancer risk and bilateral salpingectomy will be of significant benefit.  After counseling, pt too agrees to keep ovaries since will have more benefits.  Plan to take peritoneal washings and remove ovaries if appear abnormal at surgery.    Risks/complications of surgery reviewed incl infection, bleeding, damage to internal organs including bladder, bowels, ureters, blood vessels, other risks from anesthesia, VTE and delayed complications of any surgery, complications in future surgery reviewed, all risks increased due to medical problems and prior ventral hernial surgery and mesh.  [name] R 01/22/2015, 7:05 AM   UPDATE:  H&P and plan reviewed, agree with above, no new changes.  V.Lauria Depoy,MD

## 2015-01-23 ENCOUNTER — Encounter (HOSPITAL_COMMUNITY): Payer: Self-pay

## 2015-01-23 DIAGNOSIS — D060 Carcinoma in situ of endocervix: Secondary | ICD-10-CM | POA: Diagnosis not present

## 2015-01-23 LAB — BASIC METABOLIC PANEL
Anion gap: 7 (ref 5–15)
BUN: 7 mg/dL (ref 6–20)
CO2: 23 mmol/L (ref 22–32)
Calcium: 8.5 mg/dL — ABNORMAL LOW (ref 8.9–10.3)
Chloride: 105 mmol/L (ref 101–111)
Creatinine, Ser: 0.9 mg/dL (ref 0.44–1.00)
GFR calc Af Amer: >60 mL/min (ref >60)
GFR calc non Af Amer: >60 mL/min (ref >60)
Glucose, Bld: 170 mg/dL — ABNORMAL HIGH (ref 65–99)
Potassium: 4.1 mmol/L (ref 3.5–5.1)
Sodium: 135 mmol/L (ref 135–145)

## 2015-01-23 LAB — CBC
HCT: 32.2 % — ABNORMAL LOW (ref 36.0–46.0)
Hemoglobin: 10.5 g/dL — ABNORMAL LOW (ref 12.0–15.0)
MCH: 25.4 pg — ABNORMAL LOW (ref 26.0–34.0)
MCHC: 32.6 g/dL (ref 30.0–36.0)
MCV: 78 fL (ref 78.0–100.0)
Platelets: 286 10*3/uL (ref 150–400)
RBC: 4.13 MIL/uL (ref 3.87–5.11)
RDW: 16.2 % — ABNORMAL HIGH (ref 11.5–15.5)
WBC: 13.1 10*3/uL — ABNORMAL HIGH (ref 4.0–10.5)

## 2015-01-23 LAB — GLUCOSE, CAPILLARY: Glucose-Capillary: 153 mg/dL — ABNORMAL HIGH (ref 65–99)

## 2015-01-23 MED ORDER — IBUPROFEN 200 MG PO TABS
600.0000 mg | ORAL_TABLET | Freq: Four times a day (QID) | ORAL | Status: DC | PRN
Start: 2015-01-23 — End: 2016-09-20

## 2015-01-23 MED ORDER — OXYCODONE-ACETAMINOPHEN 5-325 MG PO TABS: 1.0000 | ORAL_TABLET | ORAL | Status: DC | PRN

## 2015-01-23 NOTE — Progress Notes (Signed)
1 Day Post-Op Procedure(s) (LRB): ROBOTIC ASSISTED TOTAL HYSTERECTOMY With Pelvic Washings, Lysis of Adhesions (N/A) LAPAROSCOPIC BILATERAL SALPINGECTOMY (Bilateral)  Subjective: Patient reports tolerating PO and no problems voiding.  Ambulated. No vomiting or nausea. No vaginal bleeding.   Objective: I have reviewed patient's vital signs, intake and output, medications and labs.  General: alert and cooperative Resp: clear to auscultation bilaterally Cardio: regular rate and rhythm, S1, S2 normal, no murmur, click, rub or gallop GI: soft, non-tender; bowel sounds normal; no masses,  no organomegaly and incision: clean, dry and intact Extremities: extremities normal, atraumatic, no cyanosis or edema and Homans sign is negative, no sign of DVT Vaginal Bleeding: none reproted  Assessment: s/p Procedure(s): ROBOTIC ASSISTED TOTAL HYSTERECTOMY With Pelvic Washings, Lysis of Adhesions (N/A) LAPAROSCOPIC BILATERAL SALPINGECTOMY (Bilateral): stable, progressing well, tolerating diet and ready for discharge.   Plan: Discharge home. Surgical findings reviewed, Post-op instructions, warning s/s, activity, f/up reviewed.      Arzell Mcgeehan R 01/23/2015, 2:54 PM

## 2015-01-23 NOTE — Progress Notes (Signed)
Right upper lip noted to be swollen, pinkish in color but patient denies pain on area.  Will continue to observe.

## 2015-01-23 NOTE — Progress Notes (Signed)
Discharge teaching complete. Pt understood all instructions and did not have any questions. Pt ambulated out of the hospital and discharged home to family.

## 2015-01-23 NOTE — Anesthesia Postprocedure Evaluation (Signed)
Anesthesia Post Note  Patient: [name]  Procedure(s) Performed: Procedure(s): ROBOTIC ASSISTED TOTAL HYSTERECTOMY With Pelvic Washings, Lysis of Adhesions (N/A) LAPAROSCOPIC BILATERAL SALPINGECTOMY (Bilateral)  Anesthesia type: General  Patient location: [facility]  Post pain: Pain level controlled  Post assessment: Post-op Vital signs reviewed  Last Vitals: BP 101/56 mmHg  Pulse 103  Temp(Src) 36.8 C (Oral)  Resp 16  Ht 5\' 5"  (1.651 m)  Wt 300 lb (136.079 kg)  BMI 49.92 kg/m2  SpO2 100%  Post vital signs: Reviewed  Level of consciousness: awake  Complications: No apparent anesthesia complications

## 2015-01-23 NOTE — Discharge Instructions (Signed)
Total Laparoscopic Hysterectomy, Care After °Refer to this sheet in the next few weeks. These instructions provide you with information on caring for yourself after your procedure. Your health care provider may also give you more specific instructions. Your treatment has been planned according to current medical practices, but problems sometimes occur. Call your health care provider if you have any problems or questions after your procedure. °WHAT TO EXPECT AFTER THE PROCEDURE °· Pain and bruising at the incision sites. You will be given pain medicine to control it. °· Menopausal symptoms such as hot flashes, night sweats, and insomnia if your ovaries were removed. °· Sore throat from the breathing tube that was inserted during surgery. °HOME CARE INSTRUCTIONS °· Only take over-the-counter or prescription medicines for pain, discomfort, or fever as directed by your health care provider.   °· Do not take aspirin. It can cause bleeding.   °· Do not drive when taking pain medicine.   °· Follow your health care provider's advice regarding diet, exercise, lifting, driving, and general activities.   °· Resume your usual diet as directed and allowed.   °· Get plenty of rest and sleep.   °· Do not douche, use tampons, or have sexual intercourse for at least 6 weeks, or until your health care provider gives you permission.   °· Change your bandages (dressings) as directed by your health care provider.   °· Monitor your temperature and notify your health care provider of a fever.   °· Take showers instead of baths for 2-3 weeks.   °· Do not drink alcohol until your health care provider gives you permission.   °· If you develop constipation, you may take a mild laxative with your health care provider's permission. Bran foods may help with constipation problems. Drinking enough fluids to keep your urine clear or pale yellow may help as well.   °· Try to have someone home with you for 1-2 weeks to help around the house.    °· Keep all of your follow-up appointments as directed by your health care provider.   °SEEK MEDICAL CARE IF: °· You have swelling, redness, or increasing pain around your incision sites.   °· You have pus coming from your incision.   °· You notice a bad smell coming from your incision.   °· Your incision breaks open.   °· You feel dizzy or lightheaded.   °· You have pain or bleeding when you urinate.   °· You have persistent diarrhea.   °· You have persistent nausea and vomiting.   °· You have abnormal vaginal discharge.   °· You have a rash.   °· You have any type of abnormal reaction or develop an allergy to your medicine.   °· You have poor pain control with your prescribed medicine.   °SEEK IMMEDIATE MEDICAL CARE IF: °· You have chest pain or shortness of breath. °· You have severe abdominal pain that is not relieved with pain medicine. °· You have pain or swelling in your legs. °MAKE SURE YOU: °· Understand these instructions. °· Will watch your condition. °· Will get help right away if you are not doing well or get worse. °Document Released: 04/16/2013 Document Revised: 07/01/2013 Document Reviewed: 04/16/2013 °[employer]® Patient Information ©2015 [employer]. This information is not intended to replace advice given to you by your health care provider. Make sure you discuss any questions you have with your health care provider. ° °

## 2015-01-23 NOTE — Addendum Note (Signed)
Addendum  created 01/23/15 1126 by Flossie Dibble, CRNA   Modules edited: Notes Section   Notes Section:  File: [id number]

## 2015-01-23 NOTE — Discharge Summary (Signed)
Physician Discharge Summary  Patient ID: [name] MRN: [id number] DOB/AGE: 12/05/1965 49 y.o.  Admit date: 01/22/2015 Discharge date: 01/23/2015  Admission Diagnoses: CIN-III, Menometrorrhagia   Discharge Diagnoses:  Active Problems:   S/P laparoscopic hysterectomy  Discharged Condition: Stable  Hospital Course: Uncomplicated surgery and post-operative recovery  Significant Diagnostic Studies: labs- post op CBC, BMP stable  Discharge Exam: Blood pressure 120/76, pulse 101, temperature 99.4 F (37.4 C), temperature source Oral, resp. rate 18, height 5\' 5"  (1.651 m), weight 300 lb (136.079 kg), SpO2 100 %. Normal post-op exam  Disposition: 01-Home or Self Care  Discharge Instructions    (HEART FAILURE PATIENTS) Call MD:  Anytime you have any of the following symptoms: 1) 3 pound weight gain in 24 hours or 5 pounds in 1 week 2) shortness of breath, with or without a dry hacking cough 3) swelling in the hands, feet or stomach 4) if you have to sleep on extra pillows at night in order to breathe.    Complete by:  As directed      Call MD for:  difficulty breathing, headache or visual disturbances    Complete by:  As directed      Call MD for:  extreme fatigue    Complete by:  As directed      Call MD for:  hives    Complete by:  As directed      Call MD for:  persistant dizziness or light-headedness    Complete by:  As directed      Call MD for:  persistant nausea and vomiting    Complete by:  As directed      Call MD for:  redness, tenderness, or signs of infection (pain, swelling, redness, odor or green/yellow discharge around incision site)    Complete by:  As directed      Call MD for:  severe uncontrolled pain    Complete by:  As directed      Call MD for:  temperature >100.4    Complete by:  As directed      Call MD for:    Complete by:  As directed   Vaginal bleeding that is more than spotting Blood sugar more than 180     Diet - low sodium heart healthy     Complete by:  As directed      Driving Restrictions    Complete by:  As directed   2weeks     Increase activity slowly    Complete by:  As directed      Lifting restrictions    Complete by:  As directed   20 lbs maximum until 6 weeks     No dressing needed    Complete by:  As directed   Remove central honey comb dressing in 5 days     Sexual Activity Restrictions    Complete by:  As directed   8 weeks post-operatively            Medication List    TAKE these medications        baclofen 10 MG tablet  Commonly known as:  LIORESAL  Take 1 tablet as needed at onset of migraine.     canagliflozin 300 MG Tabs tablet  Commonly known as:  INVOKANA  Take 300 mg by mouth daily before breakfast.     fexofenadine 180 MG tablet  Commonly known as:  ALLEGRA  Take 180 mg by mouth daily.     folic acid 1  MG tablet  Commonly known as:  FOLVITE  Take 1 mg by mouth daily.     ibuprofen 200 MG tablet  Commonly known as:  ADVIL  Take 3 tablets (600 mg total) by mouth every 6 (six) hours as needed.     metFORMIN 1000 MG tablet  Commonly known as:  GLUCOPHAGE  Take 1 tablet (1,000 mg total) by mouth 2 (two) times daily with a meal.     oxyCODONE-acetaminophen 5-325 MG per tablet  Commonly known as:  PERCOCET/ROXICET  Take 1-2 tablets by mouth every 4 (four) hours as needed for moderate pain or severe pain (moderate to severe pain (when tolerating fluids)).     topiramate 50 MG tablet  Commonly known as:  TOPAMAX  Take 1 tablet (50 mg total) by mouth at bedtime.     traMADol 50 MG tablet  Commonly known as:  ULTRAM  Take 2 tablets (100 mg total) by mouth 3 (three) times daily between meals.     vitamin B-12 500 MCG tablet  Commonly known as:  CYANOCOBALAMIN  Take 1,000 mcg by mouth daily.     vitamin C 100 MG tablet  Take 100 mg by mouth 2 (two) times daily.           Follow-up Information    Follow up with Hancel Ion R, MD In 2 weeks.   Specialty:  Obstetrics and  Gynecology   Contact information:   [address] [place] [address] [phone]       Signed: Elveria Royals 01/23/2015, 3:01 PM

## 2015-01-25 ENCOUNTER — Encounter (HOSPITAL_COMMUNITY): Payer: Self-pay | Admitting: Obstetrics & Gynecology

## 2015-01-25 MED FILL — Heparin Sodium (Porcine) Inj 5000 Unit/ML: INTRAMUSCULAR | Qty: 1 | Status: AC

## 2015-02-16 ENCOUNTER — Other Ambulatory Visit: Payer: Self-pay | Admitting: Internal Medicine

## 2015-02-19 ENCOUNTER — Ambulatory Visit: Payer: No Typology Code available for payment source | Admitting: Physical Medicine & Rehabilitation

## 2015-02-19 ENCOUNTER — Ambulatory Visit: Payer: No Typology Code available for payment source

## 2015-02-26 ENCOUNTER — Ambulatory Visit: Payer: No Typology Code available for payment source | Admitting: Physical Medicine & Rehabilitation

## 2015-03-16 ENCOUNTER — Ambulatory Visit: Payer: No Typology Code available for payment source | Admitting: Internal Medicine

## 2015-03-23 ENCOUNTER — Encounter: Payer: No Typology Code available for payment source | Attending: Physical Medicine & Rehabilitation

## 2015-03-23 ENCOUNTER — Ambulatory Visit (HOSPITAL_BASED_OUTPATIENT_CLINIC_OR_DEPARTMENT_OTHER): Payer: No Typology Code available for payment source | Admitting: Physical Medicine & Rehabilitation

## 2015-03-23 ENCOUNTER — Encounter: Payer: Self-pay | Admitting: Physical Medicine & Rehabilitation

## 2015-03-23 VITALS — BP 146/83 | HR 83

## 2015-03-23 DIAGNOSIS — Z8249 Family history of ischemic heart disease and other diseases of the circulatory system: Secondary | ICD-10-CM | POA: Diagnosis not present

## 2015-03-23 DIAGNOSIS — Z8489 Family history of other specified conditions: Secondary | ICD-10-CM | POA: Insufficient documentation

## 2015-03-23 DIAGNOSIS — D571 Sickle-cell disease without crisis: Secondary | ICD-10-CM | POA: Insufficient documentation

## 2015-03-23 DIAGNOSIS — E669 Obesity, unspecified: Secondary | ICD-10-CM | POA: Insufficient documentation

## 2015-03-23 DIAGNOSIS — M5127 Other intervertebral disc displacement, lumbosacral region: Secondary | ICD-10-CM | POA: Insufficient documentation

## 2015-03-23 DIAGNOSIS — M25569 Pain in unspecified knee: Secondary | ICD-10-CM | POA: Diagnosis not present

## 2015-03-23 DIAGNOSIS — K219 Gastro-esophageal reflux disease without esophagitis: Secondary | ICD-10-CM | POA: Insufficient documentation

## 2015-03-23 DIAGNOSIS — G894 Chronic pain syndrome: Secondary | ICD-10-CM | POA: Diagnosis not present

## 2015-03-23 DIAGNOSIS — Z800 Family history of malignant neoplasm of digestive organs: Secondary | ICD-10-CM | POA: Diagnosis not present

## 2015-03-23 DIAGNOSIS — M961 Postlaminectomy syndrome, not elsewhere classified: Secondary | ICD-10-CM | POA: Insufficient documentation

## 2015-03-23 DIAGNOSIS — I10 Essential (primary) hypertension: Secondary | ICD-10-CM | POA: Insufficient documentation

## 2015-03-23 DIAGNOSIS — Z803 Family history of malignant neoplasm of breast: Secondary | ICD-10-CM | POA: Insufficient documentation

## 2015-03-23 DIAGNOSIS — M47816 Spondylosis without myelopathy or radiculopathy, lumbar region: Secondary | ICD-10-CM

## 2015-03-23 DIAGNOSIS — E119 Type 2 diabetes mellitus without complications: Secondary | ICD-10-CM | POA: Insufficient documentation

## 2015-03-23 DIAGNOSIS — M549 Dorsalgia, unspecified: Secondary | ICD-10-CM | POA: Diagnosis present

## 2015-03-23 MED ORDER — DIAZEPAM 10 MG PO TABS: 10.0000 mg | ORAL_TABLET | Freq: Once | ORAL | Status: DC

## 2015-03-23 NOTE — Patient Instructions (Signed)

## 2015-03-23 NOTE — Progress Notes (Signed)
  [facility]   Name: [name] DOB:1966/05/30 MRN: [id number]  Date:03/23/2015  Physician: Alysia Penna, MD    Nurse/CMA:Teairra Millar RN Rosalita Levan CMA  Allergies:  Allergies  Allergen Reactions  . Vicodin [Hydrocodone-Acetaminophen] Swelling and Rash    Pt is able to take percocet without problems    Consent Signed: Yes.    Is patient diabetic? Yes.    CBG today? 130  Pregnant: No. LMP: Patient's last menstrual period was 01/16/2015. (age 28-55) (just had Hysterectomy )  Anticoagulants: no Anti-inflammatory: no Antibiotics: no  Procedure: left medial branch block Position: Prone Start Time: 3:18 End Time:3:27 Fluoro Time:52 sec   RN/CMA Troutman CMA Troutman CMA    Time 3:10 3:34    BP 146/83 159/89    Pulse 83 84    Respirations 16 16    O2 Sat 100 100    S/S 6 6    Pain Level 7/10 0/10     D/C home with her sister in law, patient A & O X 3, D/C instructions reviewed, and sits independently.

## 2015-03-23 NOTE — Progress Notes (Signed)
Left Lumbar L2, L3, L4  medial branch blocks and L 5 dorsal ramus injection under fluoroscopic guidance   Indication: Left Lumbar pain which is not relieved by medication management or other conservative care and interfering with self-care and mobility.  Informed consent was obtained after describing risks and benefits of the procedure with the patient, this includes bleeding, bruising, infection, paralysis and medication side effects.  The patient wishes to proceed and has given written consent.  The patient was placed in a prone position.  The lumbar area was marked and prepped with Betadine.  One mL of 1% lidocaine was injected into each of 3 areas into the skin and subcutaneous tissue.  Then a 22-gauge 5 inch spinal needle was inserted targeting the junction of the left S1 superior articular process and sacral ala junction.  Needle was advanced under fluoroscopic guidance.  Bone contact was made.  Omnipaque 180 was injected x 0.5 mL demonstrating no intravascular uptake.  Then a solution containing one mL of 4 mg per mL dexamethasone and 3 mL of 2% MPF lidocaine was injected x 0.5 mL.  Then the left L5 superior articular process in transverse process junction was targeted.  Bone contact was made.  Omnipaque 180 was injected x 0.5 mL demonstrating no intravascular uptake.  Then a solution containing one mL of 4 mg per mL dexamethasone and 3 mL of 2% MPF lidocaine was injected x 0.5 mL.  Then the left L4 superior articular process in transverse process junction was targeted.  Bone contact was made.  Omnipaque 180 was injected x 0.5 mL demonstrating no intravascular uptake.  Then a solution containing one mL of 4 mg per mL dexamethasone and 3 mL of 2% MPF lidocaine was injected x 0.5 mL. Then the left L3 superior articular process in transverse process junction was targeted.  Bone contact was made.  Omnipaque 180 was injected x 0.5 mL demonstrating no intravascular uptake.  Then a solution containing one mL of  4 mg per mL dexamethasone and 3 mL of 2% MPF lidocaine was injected x 0.5 mL.  Patient tolerated procedure well.  Post procedure instructions were given.

## 2015-03-29 ENCOUNTER — Ambulatory Visit: Payer: No Typology Code available for payment source | Admitting: Medical

## 2015-03-29 ENCOUNTER — Encounter: Payer: Self-pay | Admitting: Medical

## 2015-03-29 ENCOUNTER — Ambulatory Visit (INDEPENDENT_AMBULATORY_CARE_PROVIDER_SITE_OTHER): Payer: No Typology Code available for payment source | Admitting: Medical

## 2015-03-29 VITALS — BP 143/85 | HR 80 | Temp 98.3°F | Resp 16 | Ht 65.0 in | Wt 294.6 lb

## 2015-03-29 DIAGNOSIS — R197 Diarrhea, unspecified: Secondary | ICD-10-CM

## 2015-03-29 DIAGNOSIS — R112 Nausea with vomiting, unspecified: Secondary | ICD-10-CM

## 2015-03-29 LAB — CBC WITH DIFFERENTIAL/PLATELET
Basophils Absolute: 0.1 10*3/uL (ref 0.0–0.1)
Basophils Relative: 0.6 % (ref 0.0–3.0)
Eosinophils Absolute: 0.2 10*3/uL (ref 0.0–0.7)
Eosinophils Relative: 1.7 % (ref 0.0–5.0)
HCT: 37.5 % (ref 36.0–46.0)
Hemoglobin: 12.1 g/dL (ref 12.0–15.0)
Lymphocytes Relative: 29.1 % (ref 12.0–46.0)
Lymphs Abs: 3.1 10*3/uL (ref 0.7–4.0)
MCHC: 32.3 g/dL (ref 30.0–36.0)
MCV: 80.9 fl (ref 78.0–100.0)
Monocytes Absolute: 0.5 10*3/uL (ref 0.1–1.0)
Monocytes Relative: 4.8 % (ref 3.0–12.0)
Neutro Abs: 6.9 10*3/uL (ref 1.4–7.7)
Neutrophils Relative %: 63.8 % (ref 43.0–77.0)
Platelets: 351 10*3/uL (ref 150.0–400.0)
RBC: 4.63 Mil/uL (ref 3.87–5.11)
RDW: 17.6 % — ABNORMAL HIGH (ref 11.5–15.5)
WBC: 10.7 10*3/uL — ABNORMAL HIGH (ref 4.0–10.5)

## 2015-03-29 LAB — COMPREHENSIVE METABOLIC PANEL
ALT: 8 U/L (ref 0–35)
AST: 10 U/L (ref 0–37)
Albumin: 3.6 g/dL (ref 3.5–5.2)
Alkaline Phosphatase: 61 U/L (ref 39–117)
BUN: 10 mg/dL (ref 6–23)
CO2: 23 mEq/L (ref 19–32)
Calcium: 9.2 mg/dL (ref 8.4–10.5)
Chloride: 107 mEq/L (ref 96–112)
Creatinine, Ser: 0.69 mg/dL (ref 0.40–1.20)
GFR: 116.5 mL/min (ref >60.00)
Glucose, Bld: 92 mg/dL (ref 70–99)
Potassium: 3.6 mEq/L (ref 3.5–5.1)
Sodium: 138 mEq/L (ref 135–145)
Total Bilirubin: 0.2 mg/dL (ref 0.2–1.2)
Total Protein: 7.6 g/dL (ref 6.0–8.3)

## 2015-03-29 MED ORDER — ONDANSETRON 8 MG PO TBDP: 8.0000 mg | ORAL_TABLET | Freq: Three times a day (TID) | ORAL | Status: DC | PRN

## 2015-03-29 NOTE — Progress Notes (Signed)
Pre visit review using our clinic review tool, if applicable. No additional management support is needed unless otherwise documented below in the visit note. 

## 2015-03-29 NOTE — Patient Instructions (Signed)
You have likely viral gastroenteritis but I want to investigate if other infectious etiology. I want you to rest, hydrate, follow bland diet guidlines and take tylenol for fever.  Take imodium otc for diarrhea. For nausea of vomiting, I am prescribing zofran. For hydration use propel fitness water.(sugar free)  There is some chance that your have a bacterial infection so I do want you to get stool panel kit and turn that in as soon as possible. Turning stool panel kit earlier will provide [place] with quicker result of studies and more informed decision if antibiotics are needed.  Will get labs today as well.  I do think side effects of injection should be wearing off. In addition your blood sugar appears to be relatively controlled.  Follow up this Thursday with Dr. Larose Kells as regularly scheduled or as needed with myself.

## 2015-03-29 NOTE — Progress Notes (Signed)
Subjective:    Patient ID: [name], female    DOB: 11/18/1965, 49 y.o.   MRN: [id number]  HPI   Pt in stating feels nausea since Tuesday evening. Pt states her symptoms occurred after injections. Pt had lumbar nerve block. Note review looks like steroid and lidocaine was injected. Pt attributed these symptoms to the injection side effects. Pt blood sugar levels have been between 150-170.  Pt states nauseau came on about 1.5 hour after procedure. Pt states day after developed diarrhea on Wednesday. Pt states loose stools occuring twice a day. She states her stools are watery. Pt states if she ate more she feels that would have more loose stools. Some sweats but pt thinks these may be hot flashes. Pt vomiting about 2-3 times a day. No recent antibiotics since July.   Review of Systems  Constitutional: Positive for diaphoresis and fatigue. Negative for appetite change.  Respiratory: Negative for cough, choking, chest tightness, shortness of breath and wheezing.   Cardiovascular: Negative for chest pain and palpitations.  Gastrointestinal: Positive for abdominal pain.       Early on had some abdomen cramping with loose stools.  Musculoskeletal: Negative for back pain.  Neurological: Negative for dizziness, weakness, light-headedness, numbness and headaches.  Hematological: Negative for adenopathy. Does not bruise/bleed easily.  Psychiatric/Behavioral: Negative for behavioral problems and confusion.    Past Medical History  Diagnosis Date  . Obesity   . GERD (gastroesophageal reflux disease)   . Anxiety state, unspecified 05/16/2013  . Sickle cell trait   . Asthma     "only when I get a sinus infection which is 1-2 X/yr" (08/27/2013)  . Diabetes     "borderline" (08/27/2013)  . Migraine     "once or twice/month now" (08/27/2013)  . Arthritis     "right knee" (08/27/2013)  . Hypertension     no meds  . Wears glasses     Social History   Social History  . Marital Status:  Divorced    Spouse Name: N/A  . Number of Children: 2  . Years of Education: N/A   Occupational History  . applied for disability, FT student    Social History Main Topics  . Smoking status: Never Smoker   . Smokeless tobacco: Never Used  . Alcohol Use: No  . Drug Use: No  . Sexual Activity: Not Currently    Birth Control/ Protection: None   Other Topics Concern  . Not on file   Social History Narrative   Lives in [place] by herself         Past Surgical History  Procedure Laterality Date  . Shoulder arthroscopy w/ rotator cuff repair Right 2006, 2007  . Umbilical hernia repair  2001, 2003  . Wisdom tooth extraction  1985  . Anterior cervical decomp/discectomy fusion  08/27/2013  . Cholecystectomy  06/1989  . Hernia repair    . Knee arthroscopy Left 08/2003  . Tubal ligation  1991  . Anterior cervical decomp/discectomy fusion N/A 08/27/2013    Procedure: ANTERIOR CERVICAL /DISCECTOMY FUSION (ACDF) C5-C7  2 LEVELS;  Surgeon: Melina Schools, MD;  Location: [facility];  Service: Orthopedics;  Laterality: N/A;  . Left heart catheterization with coronary angiogram N/A 04/15/2013    Procedure: LEFT HEART CATHETERIZATION WITH CORONARY ANGIOGRAM;  Surgeon: Peter M Martinique, MD;  Location: [facility] CATH LAB;  Service: Cardiovascular;  Laterality: N/A;  . Breast reduction surgery Bilateral 09/28/2014    Procedure: MAMMARY REDUCTION  (BREAST) BILATERAL;  Surgeon: Cristine Polio, MD;  Location: [facility];  Service: Plastics;  Laterality: Bilateral;  . Liposuction Bilateral 09/28/2014    Procedure: LIPOSUCTION;  Surgeon: Cristine Polio, MD;  Location: [facility];  Service: Plastics;  Laterality: Bilateral;  . Breast surgery    . Dilation and curettage of uterus    . Robotic assisted total hysterectomy N/A 01/22/2015    Procedure: ROBOTIC ASSISTED TOTAL HYSTERECTOMY With Pelvic Washings, Lysis of Adhesions;  Surgeon: Azucena Fallen, MD;  Location: [facility];  Service: Gynecology;   Laterality: N/A;  . Laparoscopic bilateral salpingectomy Bilateral 01/22/2015    Procedure: LAPAROSCOPIC BILATERAL SALPINGECTOMY;  Surgeon: Azucena Fallen, MD;  Location: [facility];  Service: Gynecology;  Laterality: Bilateral;    Family History  Problem Relation Age of Onset  . Colon cancer Neg Hx   . Breast cancer Other      GM  . Diabetes Father     F, B, S  . Pancreatic cancer Other     GM  . CAD Sister     MI at age 41  . CAD Brother     CHF, died at age 27  . Heart attack Brother     died suddenly @ 34  . CAD Father     died of MI @ 60  . Kidney disease Father     Allergies  Allergen Reactions  . Vicodin [Hydrocodone-Acetaminophen] Swelling and Rash    Pt is able to take percocet without problems    Current Outpatient Prescriptions on File Prior to Visit  Medication Sig Dispense Refill  . Ascorbic Acid (VITAMIN C) 100 MG tablet Take 100 mg by mouth 2 (two) times daily.     . baclofen (LIORESAL) 10 MG tablet Take 1 tablet as needed at onset of migraine. 20 each 5  . canagliflozin (INVOKANA) 300 MG TABS tablet Take 300 mg by mouth daily before breakfast. 30 tablet 3  . diazepam (VALIUM) 10 MG tablet Take 1 tablet (10 mg total) by mouth once. Take 1/2 hr prior to injection 1 tablet 2  . fexofenadine (ALLEGRA) 180 MG tablet Take 180 mg by mouth daily.    . folic acid (FOLVITE) 1 MG tablet Take 1 mg by mouth daily.    Marland Kitchen ibuprofen (ADVIL) 200 MG tablet Take 3 tablets (600 mg total) by mouth every 6 (six) hours as needed. 30 tablet 0  . metFORMIN (GLUCOPHAGE) 1000 MG tablet Take 1 tablet (1,000 mg total) by mouth 2 (two) times daily with a meal. 60 tablet 10  . oxyCODONE-acetaminophen (PERCOCET/ROXICET) 5-325 MG per tablet Take 1-2 tablets by mouth every 4 (four) hours as needed for moderate pain or severe pain (moderate to severe pain (when tolerating fluids)). 30 tablet 0  . topiramate (TOPAMAX) 50 MG tablet Take 1 tablet (50 mg total) by mouth at bedtime. 90 tablet 3  .  traMADol (ULTRAM) 50 MG tablet Take 2 tablets (100 mg total) by mouth 3 (three) times daily between meals. (Patient taking differently: Take 2 tablets 3 times a day as needed for pain) 180 tablet 1  . vitamin B-12 (CYANOCOBALAMIN) 500 MCG tablet Take 1,000 mcg by mouth daily.      No current facility-administered medications on file prior to visit.    BP 156/96 mmHg  Pulse 80  Temp(Src) 98.3 F (36.8 C) (Oral)  Resp 16  Ht 5' 5" (1.651 m)  Wt 294 lb 9.6 oz (133.63 kg)  BMI 49.02 kg/m2  SpO2 100%  LMP 01/16/2015       Objective:   Physical Exam  General Appearance- Not in acute distress.  HEENT Eyes- Scleraeral/Conjuntiva-bilat- Not Yellow. Mouth & Throat- Normal.  Chest and Lung Exam Auscultation: Breath sounds:-Normal. Adventitious sounds:- No Adventitious sounds.  Cardiovascular Auscultation:Rythm - Regular. Heart Sounds -Normal heart sounds.  Abdomen Inspection:-Inspection Normal.  Palpation/Perucssion: Palpation and Percussion of the abdomen reveal- Non Tender, No Rebound tenderness, No rigidity(Guarding) and No Palpable abdominal masses.  Liver:-Normal.  Spleen:- Normal.   Back- no cva tenderness. Baseline lumbar back pain.(lying supine looked a little uncomfortable).        Assessment & Plan:  You have likely viral gastroenteritis but I want to investigate if other infectious etiology. I want you to rest, hydrate, follow bland diet guidlines and take tylenol for fever.  Take imodium otc for diarrhea. For nausea of vomiting, I am prescribing zofran. For hydration use propel fitness water.(sugar free)  There is some chance that your have a bacterial infection so I do want you to get stool panel kit and turn that in as soon as possible. Turning stool panel kit earlier will provide [place] with quicker result of studies and more informed decision if antibiotics are needed.  Will get labs today as well.  I do think side effects of injection should be wearing off.  In addition your blood sugar appears to be relatively controlled.  Follow up this Thursday with Dr. Larose Kells as regularly scheduled or as needed with myself.

## 2015-03-31 ENCOUNTER — Other Ambulatory Visit: Payer: No Typology Code available for payment source

## 2015-03-31 ENCOUNTER — Ambulatory Visit (INDEPENDENT_AMBULATORY_CARE_PROVIDER_SITE_OTHER): Payer: No Typology Code available for payment source | Admitting: Internal Medicine

## 2015-03-31 ENCOUNTER — Encounter: Payer: Self-pay | Admitting: Internal Medicine

## 2015-03-31 VITALS — BP 128/78 | HR 88 | Temp 98.0°F | Ht 65.0 in | Wt 296.0 lb

## 2015-03-31 DIAGNOSIS — E119 Type 2 diabetes mellitus without complications: Secondary | ICD-10-CM

## 2015-03-31 DIAGNOSIS — E785 Hyperlipidemia, unspecified: Secondary | ICD-10-CM | POA: Diagnosis not present

## 2015-03-31 DIAGNOSIS — Z23 Encounter for immunization: Secondary | ICD-10-CM | POA: Diagnosis not present

## 2015-03-31 DIAGNOSIS — Z09 Encounter for follow-up examination after completed treatment for conditions other than malignant neoplasm: Secondary | ICD-10-CM

## 2015-03-31 DIAGNOSIS — I10 Essential (primary) hypertension: Secondary | ICD-10-CM

## 2015-03-31 LAB — HM DIABETES FOOT EXAM: HM Diabetic Foot Exam: NORMAL

## 2015-03-31 NOTE — Patient Instructions (Signed)
  Please schedule labs to be done within few days (fasting)    Next visit  for a routine office visit in 4 months, fasting  (30 minutes) Please schedule an appointment at the front desk

## 2015-03-31 NOTE — Progress Notes (Signed)
Subjective:    Patient ID: [name], female    DOB: 1965/10/05, 49 y.o.   MRN: [id number]  DOS:  03/31/2015 Type of visit - description : Routine visit Interval history: In general is feeling well.  Diabetes, has improved his diet, trying to exercise more, ambulatory blood sugars in the 120s. Chronic back pain: Status post a recent local injection, postprocedure have headache, nausea, vomiting and diarrhea. Feeling better, has mild residual headache. Elevated BP: Not on any medication, BP today satisfactory.   Review of Systems No chest pain or difficulty breathing No cough or sputum production No anxiety or depression at this time No vaginal itching or discharge. No rash.   Past Medical History  Diagnosis Date  . Obesity   . GERD (gastroesophageal reflux disease)   . Anxiety state, unspecified 05/16/2013  . Sickle cell trait   . Asthma     "only when I get a sinus infection which is 1-2 X/yr" (08/27/2013)  . Diabetes     "borderline" (08/27/2013)  . Migraine     "once or twice/month now" (08/27/2013)  . Arthritis     "right knee" (08/27/2013)  . Hypertension     no meds  . Wears glasses     Past Surgical History  Procedure Laterality Date  . Shoulder arthroscopy w/ rotator cuff repair Right 2006, 2007  . Umbilical hernia repair  2001, 2003  . Wisdom tooth extraction  1985  . Anterior cervical decomp/discectomy fusion  08/27/2013  . Cholecystectomy  06/1989  . Hernia repair    . Knee arthroscopy Left 08/2003  . Tubal ligation  1991  . Anterior cervical decomp/discectomy fusion N/A 08/27/2013    Procedure: ANTERIOR CERVICAL /DISCECTOMY FUSION (ACDF) C5-C7  2 LEVELS;  Surgeon: Melina Schools, MD;  Location: [facility];  Service: Orthopedics;  Laterality: N/A;  . Left heart catheterization with coronary angiogram N/A 04/15/2013    Procedure: LEFT HEART CATHETERIZATION WITH CORONARY ANGIOGRAM;  Surgeon: Peter M Martinique, MD;  Location: [facility] CATH LAB;  Service: Cardiovascular;   Laterality: N/A;  . Breast reduction surgery Bilateral 09/28/2014    Procedure: MAMMARY REDUCTION  (BREAST) BILATERAL;  Surgeon: Cristine Polio, MD;  Location: [facility];  Service: Plastics;  Laterality: Bilateral;  . Liposuction Bilateral 09/28/2014    Procedure: LIPOSUCTION;  Surgeon: Cristine Polio, MD;  Location: [facility];  Service: Plastics;  Laterality: Bilateral;  . Breast surgery    . Dilation and curettage of uterus    . Robotic assisted total hysterectomy N/A 01/22/2015    Procedure: ROBOTIC ASSISTED TOTAL HYSTERECTOMY With Pelvic Washings, Lysis of Adhesions;  Surgeon: Azucena Fallen, MD;  Location: [facility];  Service: Gynecology;  Laterality: N/A;  . Laparoscopic bilateral salpingectomy Bilateral 01/22/2015    Procedure: LAPAROSCOPIC BILATERAL SALPINGECTOMY;  Surgeon: Azucena Fallen, MD;  Location: [facility];  Service: Gynecology;  Laterality: Bilateral;    Social History   Social History  . Marital Status: Divorced    Spouse Name: N/A  . Number of Children: 2  . Years of Education: N/A   Occupational History  . applied for disability, FT student    Social History Main Topics  . Smoking status: Never Smoker   . Smokeless tobacco: Never Used  . Alcohol Use: No  . Drug Use: No  . Sexual Activity: Not Currently    Birth Control/ Protection: None   Other Topics Concern  . Not on file   Social History Narrative   Lives in  [place] by herself             Medication List       This list is accurate as of: 03/31/15 11:59 PM.  Always use your most recent med list.               baclofen 10 MG tablet  Commonly known as:  LIORESAL  Take 1 tablet as needed at onset of migraine.     canagliflozin 300 MG Tabs tablet  Commonly known as:  INVOKANA  Take 300 mg by mouth daily before breakfast.     diazepam 10 MG tablet  Commonly known as:  VALIUM  Take 1 tablet (10 mg total) by mouth once. Take 1/2 hr prior to injection     fexofenadine  180 MG tablet  Commonly known as:  ALLEGRA  Take 180 mg by mouth daily.     folic acid 1 MG tablet  Commonly known as:  FOLVITE  Take 1 mg by mouth daily.     ibuprofen 200 MG tablet  Commonly known as:  ADVIL  Take 3 tablets (600 mg total) by mouth every 6 (six) hours as needed.     metFORMIN 1000 MG tablet  Commonly known as:  GLUCOPHAGE  Take 1 tablet (1,000 mg total) by mouth 2 (two) times daily with a meal.     ondansetron 8 MG disintegrating tablet  Commonly known as:  ZOFRAN ODT  Take 1 tablet (8 mg total) by mouth every 8 (eight) hours as needed for nausea or vomiting.     topiramate 50 MG tablet  Commonly known as:  TOPAMAX  Take 1 tablet (50 mg total) by mouth at bedtime.     vitamin B-12 500 MCG tablet  Commonly known as:  CYANOCOBALAMIN  Take 1,000 mcg by mouth daily.     vitamin C 100 MG tablet  Take 100 mg by mouth 2 (two) times daily.           Objective:   Physical Exam BP 128/78 mmHg  Pulse 88  Temp(Src) 98 F (36.7 C) (Oral)  Ht 5\' 5"  (1.651 m)  Wt 296 lb (134.265 kg)  BMI 49.26 kg/m2  SpO2 98%  LMP 01/16/2015 General:   Well developed, well nourished . NAD.  HEENT:  Normocephalic . Face symmetric, atraumatic Lungs:  CTA B Normal respiratory effort, no intercostal retractions, no accessory muscle use. Heart: RRR,  no murmur.  No pretibial edema bilaterally  Diabetic foot exam: Normal skin, normal pedal pulses, pinprick examination negative Neuro: A and OX3.  Speech normal, gait appropriate for age and unassisted Psych--  Cognition and judgment appear intact.  Cooperative with normal attention span and concentration.  Behavior appropriate. No anxious or depressed appearing.      Assessment & Plan:   Assessment > DM, no neuropathy Dyslipidemia Elevated BP GERD Asthma-- associated with URIs Anxiety MSK: DJD. Multiple surgeries. Chronic back pain, local injections back Dr. Ella Bodo --on tramadol, robaxin, Valium  pre-procedures Migraine headaches -- on topamax, baclofen Sickle cell trait CP: cardiac cath 2014 -- normal  Plan  Diabetes: Last A1c elevated, invokana  dose increase, good compliance. No apparent side effects. She is doing better with diet and exercise: Praised! Check A1c Dyslipidemia: Recheck, w/better diet and hopefully better diabetes control she may not need medications for now DJD: Recent local injection is helping with back pain. Takes tramadol and Robaxin as needed only, occasionally use ibuprofen Primary care: Flu   and pneumonia shot today  RTC 4 months

## 2015-03-31 NOTE — Progress Notes (Signed)
Pre visit review using our clinic review tool, if applicable. No additional management support is needed unless otherwise documented below in the visit note. 

## 2015-04-01 ENCOUNTER — Ambulatory Visit: Payer: No Typology Code available for payment source | Admitting: Internal Medicine

## 2015-04-01 ENCOUNTER — Other Ambulatory Visit (INDEPENDENT_AMBULATORY_CARE_PROVIDER_SITE_OTHER): Payer: No Typology Code available for payment source

## 2015-04-01 DIAGNOSIS — E119 Type 2 diabetes mellitus without complications: Secondary | ICD-10-CM

## 2015-04-01 DIAGNOSIS — E785 Hyperlipidemia, unspecified: Secondary | ICD-10-CM

## 2015-04-01 DIAGNOSIS — Z09 Encounter for follow-up examination after completed treatment for conditions other than malignant neoplasm: Secondary | ICD-10-CM | POA: Insufficient documentation

## 2015-04-01 DIAGNOSIS — I10 Essential (primary) hypertension: Secondary | ICD-10-CM

## 2015-04-01 LAB — BASIC METABOLIC PANEL
BUN: 10 mg/dL (ref 6–23)
CO2: 22 mEq/L (ref 19–32)
Calcium: 9 mg/dL (ref 8.4–10.5)
Chloride: 108 mEq/L (ref 96–112)
Creatinine, Ser: 0.72 mg/dL (ref 0.40–1.20)
GFR: 110.91 mL/min (ref >60.00)
Glucose, Bld: 120 mg/dL — ABNORMAL HIGH (ref 70–99)
Potassium: 3.5 mEq/L (ref 3.5–5.1)
Sodium: 139 mEq/L (ref 135–145)

## 2015-04-01 LAB — LIPID PANEL
Cholesterol: 183 mg/dL (ref 0–200)
HDL: 44.7 mg/dL (ref >39.00)
LDL Cholesterol: 115 mg/dL — ABNORMAL HIGH (ref 0–99)
NonHDL: 137.88
Total CHOL/HDL Ratio: 4
Triglycerides: 116 mg/dL (ref 0.0–149.0)
VLDL: 23.2 mg/dL (ref 0.0–40.0)

## 2015-04-01 LAB — CLOSTRIDIUM DIFFICILE BY PCR: Toxigenic C. Difficile by PCR: NOT DETECTED

## 2015-04-01 LAB — AST: AST: 11 U/L (ref 0–37)

## 2015-04-01 LAB — HEMOGLOBIN A1C: Hgb A1c MFr Bld: 6.4 % (ref 4.6–6.5)

## 2015-04-01 LAB — ALT: ALT: 10 U/L (ref 0–35)

## 2015-04-01 LAB — OVA AND PARASITE EXAMINATION: OP: NONE SEEN

## 2015-04-01 NOTE — Assessment & Plan Note (Signed)
Diabetes: Last A1c elevated, invokana  dose increase, good compliance. No apparent side effects. She is doing better with diet and exercise: Praised! Check A1c Dyslipidemia: Recheck, w/better diet and hopefully better diabetes control she may not need medications for now DJD: Recent local injection is helping with back pain. Takes tramadol and Robaxin as needed only, occasionally use ibuprofen Primary care: Flu   and pneumonia shot today RTC 4 months

## 2015-04-04 LAB — STOOL CULTURE

## 2015-04-09 ENCOUNTER — Telehealth: Payer: Self-pay

## 2015-04-09 DIAGNOSIS — M549 Dorsalgia, unspecified: Secondary | ICD-10-CM

## 2015-04-09 NOTE — Telephone Encounter (Signed)
Referral placed.

## 2015-04-09 NOTE — Telephone Encounter (Signed)
-----   Message from Colon Branch, MD sent at 04/09/2015 12:47 PM EDT ----- Vilma Prader, please enter a referral   ----- Message -----    From: Elveria Royals    Sent: 04/09/2015  12:20 PM      To: Colon Branch, MD  This patient was seen at [facility] today for her back pain.   ortho states they need a referral from you for this visit

## 2015-04-14 ENCOUNTER — Telehealth: Payer: Self-pay | Admitting: Internal Medicine

## 2015-04-14 NOTE — Telephone Encounter (Signed)
Relation to JM:EQAS Call back number: [phone]   Reason for call:  Patient scheduled cough/TB appointment for Friday 04/16/15. Requesting orders

## 2015-04-14 NOTE — Telephone Encounter (Signed)
Pt will not need orders if seeing Dr. Larose Kells. Will place orders at time of OV.

## 2015-04-15 ENCOUNTER — Ambulatory Visit: Payer: No Typology Code available for payment source | Admitting: Internal Medicine

## 2015-04-16 ENCOUNTER — Ambulatory Visit (INDEPENDENT_AMBULATORY_CARE_PROVIDER_SITE_OTHER): Payer: No Typology Code available for payment source | Admitting: Internal Medicine

## 2015-04-16 ENCOUNTER — Encounter: Payer: Self-pay | Admitting: Internal Medicine

## 2015-04-16 VITALS — BP 126/78 | HR 85 | Temp 97.8°F | Ht 65.0 in | Wt 291.4 lb

## 2015-04-16 DIAGNOSIS — R059 Cough, unspecified: Secondary | ICD-10-CM

## 2015-04-16 DIAGNOSIS — J4521 Mild intermittent asthma with (acute) exacerbation: Secondary | ICD-10-CM | POA: Diagnosis not present

## 2015-04-16 DIAGNOSIS — Z111 Encounter for screening for respiratory tuberculosis: Secondary | ICD-10-CM | POA: Diagnosis not present

## 2015-04-16 DIAGNOSIS — Z0279 Encounter for issue of other medical certificate: Secondary | ICD-10-CM

## 2015-04-16 DIAGNOSIS — R05 Cough: Secondary | ICD-10-CM | POA: Diagnosis not present

## 2015-04-16 DIAGNOSIS — Z09 Encounter for follow-up examination after completed treatment for conditions other than malignant neoplasm: Secondary | ICD-10-CM

## 2015-04-16 MED ORDER — BUDESONIDE-FORMOTEROL FUMARATE 160-4.5 MCG/ACT IN AERO: 2.0000 | INHALATION_SPRAY | Freq: Two times a day (BID) | RESPIRATORY_TRACT | Status: DC

## 2015-04-16 NOTE — Progress Notes (Signed)
Pre visit review using our clinic review tool, if applicable. No additional management support is needed unless otherwise documented below in the visit note. 

## 2015-04-16 NOTE — Patient Instructions (Signed)
Get your blood work before you leave    For cough: Drink plenty of fluids Mucinex DM twice a day until better Symbicort 2 sprays twice a day for 3 or 4 weeks. If you are not gradually getting better please let us know

## 2015-04-16 NOTE — Progress Notes (Signed)
Subjective:    Patient ID: [name], female    DOB: [birth year], 49 y.o.   MRN: [id number]  DOS:  04/16/2015 Type of visit - description : Acute visit Interval history: Had the flu and pnm shots about 3 weeks ago, 2 days later developed aches, low-grade temperature, cough, greenish sputum production. Feverish feeling is gone but she continue with cough and sputum production. Some wheezing as well. Needs a TB test, will be volunteering at her church   Review of Systems  No difficulty breathing. No nausea, vomiting, diarrhea.  Past Medical History  Diagnosis Date  . Obesity   . GERD (gastroesophageal reflux disease)   . Anxiety state, unspecified 05/16/2013  . Sickle cell trait ([place])   . Asthma     "only when I get a sinus infection which is 1-2 X/yr" (08/27/2013)  . Diabetes ([place])     "borderline" (08/27/2013)  . Migraine     "once or twice/month now" (08/27/2013)  . Arthritis     "right knee" (08/27/2013)  . Hypertension     no meds  . Wears glasses     Past Surgical History  Procedure Laterality Date  . Shoulder arthroscopy w/ rotator cuff repair Right 2006, 2007  . Umbilical hernia repair  2001, 2003  . Wisdom tooth extraction  1985  . Anterior cervical decomp/discectomy fusion  08/27/2013  . Cholecystectomy  06/1989  . Hernia repair    . Knee arthroscopy Left 08/2003  . Tubal ligation  1991  . Anterior cervical decomp/discectomy fusion N/A 08/27/2013    Procedure: ANTERIOR CERVICAL /DISCECTOMY FUSION (ACDF) C5-C7  2 LEVELS;  Surgeon: Melina Schools, MD;  Location: [facility];  Service: Orthopedics;  Laterality: N/A;  . Left heart catheterization with coronary angiogram N/A 04/15/2013    Procedure: LEFT HEART CATHETERIZATION WITH CORONARY ANGIOGRAM;  Surgeon: Peter M Martinique, MD;  Location: [facility] CATH LAB;  Service: Cardiovascular;  Laterality: N/A;  . Breast reduction surgery Bilateral 09/28/2014    Procedure: MAMMARY REDUCTION  (BREAST) BILATERAL;  Surgeon: Cristine Polio,  MD;  Location: [facility];  Service: Plastics;  Laterality: Bilateral;  . Liposuction Bilateral 09/28/2014    Procedure: LIPOSUCTION;  Surgeon: Cristine Polio, MD;  Location: [facility];  Service: Plastics;  Laterality: Bilateral;  . Breast surgery    . Dilation and curettage of uterus    . Robotic assisted total hysterectomy N/A 01/22/2015    Procedure: ROBOTIC ASSISTED TOTAL HYSTERECTOMY With Pelvic Washings, Lysis of Adhesions;  Surgeon: Azucena Fallen, MD;  Location: [facility];  Service: Gynecology;  Laterality: N/A;  . Laparoscopic bilateral salpingectomy Bilateral 01/22/2015    Procedure: LAPAROSCOPIC BILATERAL SALPINGECTOMY;  Surgeon: Azucena Fallen, MD;  Location: [facility];  Service: Gynecology;  Laterality: Bilateral;    Social History   Social History  . Marital Status: Divorced    Spouse Name: N/A  . Number of Children: 2  . Years of Education: N/A   Occupational History  . applied for disability, FT student    Social History Main Topics  . Smoking status: Never Smoker   . Smokeless tobacco: Never Used  . Alcohol Use: No  . Drug Use: No  . Sexual Activity: Not Currently    Birth Control/ Protection: None   Other Topics Concern  . Not on file   Social History Narrative   Lives in [place] by herself             Medication List  This list is accurate as of: 04/16/15 11:59 PM.  Always use your most recent med list.               baclofen 10 MG tablet  Commonly known as:  LIORESAL  Take 1 tablet as needed at onset of migraine.     budesonide-formoterol 160-4.5 MCG/ACT inhaler  Commonly known as:  SYMBICORT  Inhale 2 puffs into the lungs 2 (two) times daily.     canagliflozin 300 MG Tabs tablet  Commonly known as:  INVOKANA  Take 300 mg by mouth daily before breakfast.     diazepam 10 MG tablet  Commonly known as:  VALIUM  Take 1 tablet (10 mg total) by mouth once. Take 1/2 hr prior to injection     fexofenadine 180 MG tablet   Commonly known as:  ALLEGRA  Take 180 mg by mouth daily.     folic acid 1 MG tablet  Commonly known as:  FOLVITE  Take 1 mg by mouth daily.     ibuprofen 200 MG tablet  Commonly known as:  ADVIL  Take 3 tablets (600 mg total) by mouth every 6 (six) hours as needed.     metFORMIN 1000 MG tablet  Commonly known as:  GLUCOPHAGE  Take 1 tablet (1,000 mg total) by mouth 2 (two) times daily with a meal.     ondansetron 8 MG disintegrating tablet  Commonly known as:  ZOFRAN ODT  Take 1 tablet (8 mg total) by mouth every 8 (eight) hours as needed for nausea or vomiting.     topiramate 50 MG tablet  Commonly known as:  TOPAMAX  Take 1 tablet (50 mg total) by mouth at bedtime.     vitamin B-12 500 MCG tablet  Commonly known as:  CYANOCOBALAMIN  Take 1,000 mcg by mouth daily.     vitamin C 100 MG tablet  Take 100 mg by mouth 2 (two) times daily.           Objective:   Physical Exam BP 126/78 mmHg  Pulse 85  Temp(Src) 97.8 F (36.6 C) (Oral)  Ht 5\' 5"  (1.651 m)  Wt 291 lb 6 oz (132.167 kg)  BMI 48.49 kg/m2  SpO2 96%  LMP 01/16/2015 General:   Well developed, well nourished . NAD.  HEENT:  Normocephalic . Face symmetric, atraumatic. Nose congested, mild bilateral sinus tenderness to palpation. Right ear: Unable to see much due to wax, left ear normal. Throat symmetric and not red. Lungs:  Few rhonchi with cough, prolonged expiratory time Normal respiratory effort, no intercostal retractions, no accessory muscle use. Heart: RRR,  no murmur.  No pretibial edema bilaterally  Skin: Not pale. Not jaundice Neurologic:  alert & oriented X3.  Speech normal, gait appropriate for age and unassisted Psych--  Cognition and judgment appear intact.  Cooperative with normal attention span and concentration.  Behavior appropriate. No anxious or depressed appearing.      Assessment & Plan:   Assessment > DM, no neuropathy Dyslipidemia Elevated BP GERD Asthma-- associated  with URIs Anxiety MSK: DJD. Multiple surgeries. Chronic back pain, local injections back Dr. Ella Bodo --on tramadol, robaxin, Valium pre-procedures Migraine headaches -- on topamax, baclofen Sickle cell trait CP: cardiac cath 2014 -- normal  Plan History of asthma with mild exacerbation shortly after she got her flu shot. Will treat with Mucinex, Symbicort for 4 weeks. If not better she knows to call me, abx?. Screening for TB: See lab orders

## 2015-04-17 NOTE — Assessment & Plan Note (Signed)
History of asthma with mild exacerbation shortly after she got her flu shot. Will treat with Mucinex, Symbicort for 4 weeks. If not better she knows to call me, abx?. Screening for TB: See lab orders

## 2015-04-19 ENCOUNTER — Telehealth: Payer: Self-pay | Admitting: *Deleted

## 2015-04-19 LAB — QUANTIFERON TB GOLD ASSAY (BLOOD)
Interferon Gamma Release Assay: NEGATIVE
Mitogen value: >10 IU/mL
Quantiferon Nil Value: 0.03 IU/mL
Quantiferon Tb Ag Minus Nil Value: -0.01 IU/mL
TB Ag value: 0.02 IU/mL

## 2015-04-19 NOTE — Telephone Encounter (Signed)
Pt dropped off staff medical report/TB result forms. Filled out as much as possible and forwarded to Dr. Larose Kells. JG//CMA

## 2015-04-20 ENCOUNTER — Ambulatory Visit: Payer: No Typology Code available for payment source | Admitting: Physical Medicine & Rehabilitation

## 2015-04-22 ENCOUNTER — Ambulatory Visit (INDEPENDENT_AMBULATORY_CARE_PROVIDER_SITE_OTHER): Payer: No Typology Code available for payment source | Admitting: Neurology

## 2015-04-22 ENCOUNTER — Ambulatory Visit: Payer: No Typology Code available for payment source | Admitting: Neurology

## 2015-04-22 ENCOUNTER — Encounter: Payer: Self-pay | Admitting: Neurology

## 2015-04-22 VITALS — BP 138/84 | HR 91 | Resp 16 | Ht 65.0 in | Wt 292.0 lb

## 2015-04-22 DIAGNOSIS — G43109 Migraine with aura, not intractable, without status migrainosus: Secondary | ICD-10-CM

## 2015-04-22 MED ORDER — TOPIRAMATE 50 MG PO TABS: ORAL_TABLET | ORAL | Status: DC

## 2015-04-22 NOTE — Telephone Encounter (Signed)
Forms are currently with Dr. Larose Kells. Our turnaround time for form processing is 5-7 business days. Will fax once completed by Dr. Larose Kells. JG//CMA

## 2015-04-22 NOTE — Patient Instructions (Signed)
1. Increase Topamax 50mg : Take 1 tablet in AM, 2 tablets in PM 2. Take Baclofen as needed for migraine 3. Keep a calendar of your headaches 4. Follow-up in 4 months, call for any problems

## 2015-04-22 NOTE — Telephone Encounter (Signed)
Patient called to check the status of the paperwork she needs it completed to turn into work tomorrow.

## 2015-04-22 NOTE — Progress Notes (Signed)
NEUROLOGY FOLLOW UP OFFICE NOTE  [place] [id number]  HISTORY OF PRESENT ILLNESS: I had the pleasure of seeing [name] in follow-up in the neurology clinic on 04/22/2015.  The patient was last seen 3 months ago for worsening migraines. She was going to have a hysterectomy last July and wanted to see if migraines got better before making any changes to Topamax 50mg  qhs, and reports that migraines actually worsened post-op, and she self-increased Topamax to 100mg  qhs. She feels this has helped, the migraines are not as intense and less frequent, occurring around twice a month (previously twice a week). Baclofen takes the edge off then migraines resolve. There is some nausea and photo/phonophobia. Last migraine was a week ago. She is tolerating Topamax without any side effects. She denies any dizziness, diplopia, eye pain, focal numbness/tingling/weakness, no falls.   HPI: This is a pleasant 49 yo RH woman with a history of morbid obesity, hypertension, diabetes, chronic back pain, and migraines since age 30, who presented for worsening migraines. Migraines usually start in the back of her head then radiate to the frontal regions, with 10/10 sharp, stabbing, and throbbing pain. There is associated nausea, vomiting, photo and phonophobia, neck pain, as well as occasional dizziness and blurred vision. At times, migraines are preceded by flashing lights. She was having them once a month, however since May 2016, headaches increased in frequency to twice a week. She had found that they would occur more a day or two prior to her cycle. Since April, she has noticed a change in her menstrual period, she had a hysterectomy last July 2016. Other triggers include stress and weather changes. She has been taking Topamax for the past 10 years, increased to 50mg  daily last year. She had been seeing neurologist Dr. Domingo Cocking at [facility] until they stopped taking her insurance. She had been  receiving trigger point injections in the past, which did help with headaches, but after one session where she had severe neck pain, she stopped this. She does not recall other preventatives tried, but recalls they made her sleep (she mentions ?Valium). Imitrex helped little. Dr. Domingo Cocking had prescribed prn Baclofen for migraine rescue, which takes the edge off. She takes Tramadol for her left sciatica. She tries to get 8 hours of sleep. There is no family history of migraines. She denies any significant head injuries.   I personally reviewed head CT done 09/2011 which was normal.   PAST MEDICAL HISTORY: Past Medical History  Diagnosis Date  . Obesity   . GERD (gastroesophageal reflux disease)   . Anxiety state, unspecified 05/16/2013  . Sickle cell trait ([place])   . Asthma     "only when I get a sinus infection which is 1-2 X/yr" (08/27/2013)  . Diabetes ([place])     "borderline" (08/27/2013)  . Migraine     "once or twice/month now" (08/27/2013)  . Arthritis     "right knee" (08/27/2013)  . Hypertension     no meds  . Wears glasses     MEDICATIONS: Current Outpatient Prescriptions on File Prior to Visit  Medication Sig Dispense Refill  . Ascorbic Acid (VITAMIN C) 100 MG tablet Take 100 mg by mouth 2 (two) times daily.     . baclofen (LIORESAL) 10 MG tablet Take 1 tablet as needed at onset of migraine. 20 each 5  . canagliflozin (INVOKANA) 300 MG TABS tablet Take 300 mg by mouth daily before breakfast. 30 tablet 3  .  fexofenadine (ALLEGRA) 180 MG tablet Take 180 mg by mouth daily.    . folic acid (FOLVITE) 1 MG tablet Take 1 mg by mouth daily.    Marland Kitchen ibuprofen (ADVIL) 200 MG tablet Take 3 tablets (600 mg total) by mouth every 6 (six) hours as needed. 30 tablet 0  . metFORMIN (GLUCOPHAGE) 1000 MG tablet Take 1 tablet (1,000 mg total) by mouth 2 (two) times daily with a meal. 60 tablet 10  . topiramate (TOPAMAX) 50 MG tablet Take 1 tablet (50 mg total) by mouth at bedtime. (Patient taking  differently: Takes 2 tablets at bedtime) 90 tablet 3  . vitamin B-12 (CYANOCOBALAMIN) 500 MCG tablet Take 1,000 mcg by mouth daily.     . budesonide-formoterol (SYMBICORT) 160-4.5 MCG/ACT inhaler Inhale 2 puffs into the lungs 2 (two) times daily. (Patient not taking: Reported on 04/22/2015) 1 Inhaler 3   No current facility-administered medications on file prior to visit.    ALLERGIES: Allergies  Allergen Reactions  . Vicodin [Hydrocodone-Acetaminophen] Swelling and Rash    Pt is able to take percocet without problems    FAMILY HISTORY: Family History  Problem Relation Age of Onset  . Colon cancer Neg Hx   . Breast cancer Other      GM  . Diabetes Father     F, B, S  . Pancreatic cancer Other     GM  . CAD Sister     MI at age 30  . CAD Brother     CHF, died at age 73  . Heart attack Brother     died suddenly @ 49  . CAD Father     died of MI @ 77  . Kidney disease Father     SOCIAL HISTORY: Social History   Social History  . Marital Status: Divorced    Spouse Name: N/A  . Number of Children: 2  . Years of Education: N/A   Occupational History  . applied for disability, FT student    Social History Main Topics  . Smoking status: Never Smoker   . Smokeless tobacco: Never Used  . Alcohol Use: No  . Drug Use: No  . Sexual Activity: Not Currently    Birth Control/ Protection: None   Other Topics Concern  . Not on file   Social History Narrative   Lives in [place] by herself         REVIEW OF SYSTEMS: Constitutional: No fevers, chills, or sweats, no generalized fatigue, change in appetite Eyes: No visual changes, double vision, eye pain Ear, nose and throat: No hearing loss, ear pain, nasal congestion, sore throat Cardiovascular: No chest pain, palpitations Respiratory:  No shortness of breath at rest or with exertion, wheezes GastrointestinaI: No nausea, vomiting, diarrhea, abdominal pain, fecal incontinence Genitourinary:  No dysuria, urinary retention  or frequency Musculoskeletal:  No neck pain, back pain Integumentary: No rash, pruritus, skin lesions Neurological: as above Psychiatric: No depression, insomnia, anxiety Endocrine: No palpitations, fatigue, diaphoresis, mood swings, change in appetite, change in weight, increased thirst Hematologic/Lymphatic:  No anemia, purpura, petechiae. Allergic/Immunologic: no itchy/runny eyes, nasal congestion, recent allergic reactions, rashes  PHYSICAL EXAM: Filed Vitals:   04/22/15 1446  BP: 138/84  Pulse: 91  Resp: 16   General: No acute distress Head:  Normocephalic/atraumatic Neck: supple, no paraspinal tenderness, full range of motion Heart:  Regular rate and rhythm Lungs:  Clear to auscultation bilaterally Back: No paraspinal tenderness Skin/Extremities: No rash, no edema Neurological Exam: alert and oriented to  person, place, and time. No aphasia or dysarthria. Fund of knowledge is appropriate.  Recent and remote memory are intact. 2/3 delayed recall. Attention and concentration are normal.    Able to name objects and repeat phrases. Cranial nerves: Pupils equal, round, reactive to light.  Fundoscopic exam unremarkable, no papilledema. Extraocular movements intact with no nystagmus. Visual fields full. Facial sensation intact. No facial asymmetry. Tongue, uvula, palate midline.  Motor: Bulk and tone normal, muscle strength 5/5 throughout with no pronator drift.  Sensation to light touch intact.  No extinction to double simultaneous stimulation.  Deep tendon reflexes 2+ throughout, toes downgoing.  Finger to nose testing intact.  Gait narrow-based and steady, able to tandem walk adequately.  Romberg negative.  IMPRESSION: This is a 49 yo RH woman with a history of morbid obesity, hypertension, diabetes, chronic back pain, and migraines since age 24, who presented for increased migraines. Hysterectomy did not help with improving migraines. She will increase Topamax to 50mg  in AM, 100mg  in PM.  Continue prn Baclofen for headache rescue. She knows to minimize rescue medications to 2-3 a week to avoid rebound headaches. She will keep a headache calendar and follow-up in 4 months.   Thank you for allowing me to participate in her care.  Please do not hesitate to call for any questions or concerns.  The duration of this appointment visit was 24 minutes of face-to-face time with the patient.  Greater than 50% of this time was spent in counseling, explanation of diagnosis, planning of further management, and coordination of care.   Ellouise Newer, M.D.   CC: Dr. Larose Kells

## 2015-04-23 NOTE — Telephone Encounter (Signed)
Informed pt of forms status.  She will be in today for pick up.      Thanks.

## 2015-04-23 NOTE — Telephone Encounter (Signed)
Pt called in to check the status of her forms. I informed her of the below message. 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 Informed pt of turnaround time. Pt says that she have to have them ready for pick up today.    CB#: [phone]

## 2015-04-23 NOTE — Telephone Encounter (Signed)
Called and informed pt that forms are ready for pick up at our front desk. Copy sent for scanning. JG//CMA  

## 2015-04-27 ENCOUNTER — Other Ambulatory Visit: Payer: Self-pay | Admitting: Orthopedic Surgery

## 2015-04-27 DIAGNOSIS — Z4789 Encounter for other orthopedic aftercare: Secondary | ICD-10-CM

## 2015-04-28 ENCOUNTER — Encounter: Payer: Self-pay | Admitting: Internal Medicine

## 2015-04-28 ENCOUNTER — Ambulatory Visit (INDEPENDENT_AMBULATORY_CARE_PROVIDER_SITE_OTHER): Payer: No Typology Code available for payment source | Admitting: Internal Medicine

## 2015-04-28 VITALS — BP 138/92 | HR 87 | Temp 98.3°F | Ht 65.0 in | Wt 290.0 lb

## 2015-04-28 DIAGNOSIS — J0100 Acute maxillary sinusitis, unspecified: Secondary | ICD-10-CM | POA: Diagnosis not present

## 2015-04-28 DIAGNOSIS — Z09 Encounter for follow-up examination after completed treatment for conditions other than malignant neoplasm: Secondary | ICD-10-CM

## 2015-04-28 MED ORDER — CEFUROXIME AXETIL 500 MG PO TABS: 500.0000 mg | ORAL_TABLET | Freq: Two times a day (BID) | ORAL | Status: DC

## 2015-04-28 MED ORDER — BENZONATATE 200 MG PO CAPS: 200.0000 mg | ORAL_CAPSULE | Freq: Three times a day (TID) | ORAL | Status: DC | PRN

## 2015-04-28 NOTE — Patient Instructions (Signed)
Rest, fluids , tylenol  For cough: Take Mucinex DM twice a day as needed until better If the cough continue, take Tessalon Perles 3 times a day as needed  For nasal congestion Use OTC Nasocort or Flonase : 2 nasal sprays on each side of the nose daily until you feel better  Continue Symbicort for at least 4 weeks  Take the antibiotic as prescribed  (Ceftin) for 10 days  Call if not gradually better over the next  10 days  Call anytime if the symptoms are severe

## 2015-04-28 NOTE — Progress Notes (Signed)
Pre visit review using our clinic review tool, if applicable. No additional management support is needed unless otherwise documented below in the visit note. 

## 2015-04-28 NOTE — Progress Notes (Signed)
Subjective:    Patient ID: [name], female    DOB: 1965-10-17, 49 y.o.   MRN: [id number]  DOS:  04/28/2015 Type of visit - description : Acute visit Interval history: Since the last time she was here, she is taking Symbicort, wheezing has decreased but other respiratory symptoms have gotten worse. Sinuses and pain congestion, green nasal discharge. Voice is raspy x several days Cough is now productive of greenish sputum.   Review of Systems Had low-grade temperature for the last 2 days. No chills. Nausea or breath, occasional chest pain with cough only.   Past Medical History  Diagnosis Date  . Obesity   . GERD (gastroesophageal reflux disease)   . Anxiety state, unspecified 05/16/2013  . Sickle cell trait ([place])   . Asthma     "only when I get a sinus infection which is 1-2 X/yr" (08/27/2013)  . Diabetes ([place])     "borderline" (08/27/2013)  . Migraine     "once or twice/month now" (08/27/2013)  . Arthritis     "right knee" (08/27/2013)  . Hypertension     no meds  . Wears glasses     Past Surgical History  Procedure Laterality Date  . Shoulder arthroscopy w/ rotator cuff repair Right 2006, 2007  . Umbilical hernia repair  2001, 2003  . Wisdom tooth extraction  1985  . Anterior cervical decomp/discectomy fusion  08/27/2013  . Cholecystectomy  06/1989  . Hernia repair    . Knee arthroscopy Left 08/2003  . Tubal ligation  1991  . Anterior cervical decomp/discectomy fusion N/A 08/27/2013    Procedure: ANTERIOR CERVICAL /DISCECTOMY FUSION (ACDF) C5-C7  2 LEVELS;  Surgeon: Melina Schools, MD;  Location: [facility];  Service: Orthopedics;  Laterality: N/A;  . Left heart catheterization with coronary angiogram N/A 04/15/2013    Procedure: LEFT HEART CATHETERIZATION WITH CORONARY ANGIOGRAM;  Surgeon: Peter M Martinique, MD;  Location: [facility] CATH LAB;  Service: Cardiovascular;  Laterality: N/A;  . Breast reduction surgery Bilateral 09/28/2014    Procedure: MAMMARY REDUCTION  (BREAST)  BILATERAL;  Surgeon: Cristine Polio, MD;  Location: [facility];  Service: Plastics;  Laterality: Bilateral;  . Liposuction Bilateral 09/28/2014    Procedure: LIPOSUCTION;  Surgeon: Cristine Polio, MD;  Location: [facility];  Service: Plastics;  Laterality: Bilateral;  . Breast surgery    . Dilation and curettage of uterus    . Robotic assisted total hysterectomy N/A 01/22/2015    Procedure: ROBOTIC ASSISTED TOTAL HYSTERECTOMY With Pelvic Washings, Lysis of Adhesions;  Surgeon: Azucena Fallen, MD;  Location: [facility];  Service: Gynecology;  Laterality: N/A;  . Laparoscopic bilateral salpingectomy Bilateral 01/22/2015    Procedure: LAPAROSCOPIC BILATERAL SALPINGECTOMY;  Surgeon: Azucena Fallen, MD;  Location: [facility];  Service: Gynecology;  Laterality: Bilateral;    Social History   Social History  . Marital Status: Divorced    Spouse Name: N/A  . Number of Children: 2  . Years of Education: N/A   Occupational History  . applied for disability, FT student    Social History Main Topics  . Smoking status: Never Smoker   . Smokeless tobacco: Never Used  . Alcohol Use: No  . Drug Use: No  . Sexual Activity: Not Currently    Birth Control/ Protection: None   Other Topics Concern  . Not on file   Social History Narrative   Lives in [place] by herself  Medication List       This list is accurate as of: 04/28/15  5:35 PM.  Always use your most recent med list.               baclofen 10 MG tablet  Commonly known as:  LIORESAL  Take 1 tablet as needed at onset of migraine.     benzonatate 200 MG capsule  Commonly known as:  TESSALON  Take 1 capsule (200 mg total) by mouth 3 (three) times daily as needed for cough.     budesonide-formoterol 160-4.5 MCG/ACT inhaler  Commonly known as:  SYMBICORT  Inhale 2 puffs into the lungs 2 (two) times daily.     canagliflozin 300 MG Tabs tablet  Commonly known as:  INVOKANA  Take 300 mg by mouth  daily before breakfast.     cefUROXime 500 MG tablet  Commonly known as:  CEFTIN  Take 1 tablet (500 mg total) by mouth 2 (two) times daily with a meal.     fexofenadine 180 MG tablet  Commonly known as:  ALLEGRA  Take 180 mg by mouth daily.     folic acid 1 MG tablet  Commonly known as:  FOLVITE  Take 1 mg by mouth daily.     ibuprofen 200 MG tablet  Commonly known as:  ADVIL  Take 3 tablets (600 mg total) by mouth every 6 (six) hours as needed.     metFORMIN 1000 MG tablet  Commonly known as:  GLUCOPHAGE  Take 1 tablet (1,000 mg total) by mouth 2 (two) times daily with a meal.     topiramate 50 MG tablet  Commonly known as:  TOPAMAX  Take 1 tablet in AM, 2 tablets in PM     vitamin B-12 500 MCG tablet  Commonly known as:  CYANOCOBALAMIN  Take 1,000 mcg by mouth daily.     vitamin C 100 MG tablet  Take 100 mg by mouth 2 (two) times daily.           Objective:   Physical Exam BP 138/92 mmHg  Pulse 87  Temp(Src) 98.3 F (36.8 C) (Oral)  Ht 5\' 5"  (1.651 m)  Wt 290 lb (131.543 kg)  BMI 48.26 kg/m2  SpO2 99%  LMP 01/16/2015 General:   Well developed, well nourished . NAD.  HEENT:  Normocephalic . Face symmetric, atraumatic. TMs normal, no red no discharge. Sinuses, maxillary, bilateral TTP. Nose quite congested Lungs:  CTA B Normal respiratory effort, no intercostal retractions, no accessory muscle use. Heart: RRR,  no murmur.  No pretibial edema bilaterally  Skin: Not pale. Not jaundice Neurologic:  alert & oriented X3.  Speech normal, gait appropriate for age and unassisted Psych--  Cognition and judgment appear intact.  Cooperative with normal attention span and concentration.  Behavior appropriate. No anxious or depressed appearing.      Assessment & Plan:    Assessment > DM, no neuropathy Dyslipidemia Elevated BP GERD Asthma-- associated with URIs Anxiety MSK: DJD. Multiple surgeries. Chronic back pain, local injections back Dr.  Ella Bodo --on tramadol, robaxin, Valium pre-procedures Migraine headaches -- on topamax, baclofen Sickle cell trait CP: cardiac cath 2014 -- normal  Plan Sinusitis, bronchitis: Symptoms consistent with sinusitis and bronchitis, currently on Symbicort, no bronchospasm. Add antibiotics, see instructions.

## 2015-04-28 NOTE — Assessment & Plan Note (Signed)
Sinusitis, bronchitis: Symptoms consistent with sinusitis and bronchitis, currently on Symbicort, no bronchospasm. Add antibiotics, see instructions.

## 2015-05-24 ENCOUNTER — Emergency Department (INDEPENDENT_AMBULATORY_CARE_PROVIDER_SITE_OTHER): Payer: No Typology Code available for payment source

## 2015-05-24 ENCOUNTER — Emergency Department (INDEPENDENT_AMBULATORY_CARE_PROVIDER_SITE_OTHER)
Admission: EM | Admit: 2015-05-24 | Discharge: 2015-05-24 | Disposition: A | Discharge status: Home / Self Care | Payer: No Typology Code available for payment source | Source: Home / Self Care | Attending: Family Medicine | Admitting: Family Medicine

## 2015-05-24 ENCOUNTER — Encounter (HOSPITAL_COMMUNITY): Payer: Self-pay | Admitting: Emergency Medicine

## 2015-05-24 DIAGNOSIS — S20229A Contusion of unspecified back wall of thorax, initial encounter: Secondary | ICD-10-CM

## 2015-05-24 DIAGNOSIS — M545 Low back pain, unspecified: Secondary | ICD-10-CM

## 2015-05-24 DIAGNOSIS — W108XXA Fall (on) (from) other stairs and steps, initial encounter: Secondary | ICD-10-CM

## 2015-05-24 MED ORDER — NAPROXEN 375 MG PO TABS: 375.0000 mg | ORAL_TABLET | Freq: Two times a day (BID) | ORAL | Status: DC

## 2015-05-24 NOTE — ED Provider Notes (Signed)
CSN: [id number]     Arrival date & time 05/24/15  1911 History   First MD Initiated Contact with Patient 05/24/15 2013     Chief Complaint  Patient presents with  . Fall   (Consider location/radiation/quality/duration/timing/severity/associated sxs/prior Treatment) HPI Comments: 49 year old morbidly obese female states that she was descending the steps this morning and fell. She states that she struck her lower back. She is complaining of pain across the low back. Denies lower extremity weakness. She is able to stand and ambulate. Denies injuring her head, neck, upper back, upper extremities, chest or abdomen.   Past Medical History  Diagnosis Date  . Obesity   . GERD (gastroesophageal reflux disease)   . Anxiety state, unspecified 05/16/2013  . Sickle cell trait ([place])   . Asthma     "only when I get a sinus infection which is 1-2 X/yr" (08/27/2013)  . Diabetes ([place])     "borderline" (08/27/2013)  . Migraine     "once or twice/month now" (08/27/2013)  . Arthritis     "right knee" (08/27/2013)  . Hypertension     no meds  . Wears glasses    Past Surgical History  Procedure Laterality Date  . Shoulder arthroscopy w/ rotator cuff repair Right 2006, 2007  . Umbilical hernia repair  2001, 2003  . Wisdom tooth extraction  1985  . Anterior cervical decomp/discectomy fusion  08/27/2013  . Cholecystectomy  06/1989  . Hernia repair    . Knee arthroscopy Left 08/2003  . Tubal ligation  1991  . Anterior cervical decomp/discectomy fusion N/A 08/27/2013    Procedure: ANTERIOR CERVICAL /DISCECTOMY FUSION (ACDF) C5-C7  2 LEVELS;  Surgeon: Melina Schools, MD;  Location: [facility];  Service: Orthopedics;  Laterality: N/A;  . Left heart catheterization with coronary angiogram N/A 04/15/2013    Procedure: LEFT HEART CATHETERIZATION WITH CORONARY ANGIOGRAM;  Surgeon: Peter M Martinique, MD;  Location: [facility] C[facility] LAB;  Service: Cardiovascular;  Laterality: N/A;  . Breast reduction surgery Bilateral 09/28/2014   Procedure: MAMMARY REDUCTION  (BREAST) BILATERAL;  Surgeon: Cristine Polio, MD;  Location: [facility];  Service: Plastics;  Laterality: Bilateral;  . Liposuction Bilateral 09/28/2014    Procedure: LIPOSUCTION;  Surgeon: Cristine Polio, MD;  Location: [facility];  Service: Plastics;  Laterality: Bilateral;  . Breast surgery    . Dilation and curettage of uterus    . Robotic assisted total hysterectomy N/A 01/22/2015    Procedure: ROBOTIC ASSISTED TOTAL HYSTERECTOMY With Pelvic Washings, Lysis of Adhesions;  Surgeon: Azucena Fallen, MD;  Location: [facility];  Service: Gynecology;  Laterality: N/A;  . Laparoscopic bilateral salpingectomy Bilateral 01/22/2015    Procedure: LAPAROSCOPIC BILATERAL SALPINGECTOMY;  Surgeon: Azucena Fallen, MD;  Location: [facility];  Service: Gynecology;  Laterality: Bilateral;   Family History  Problem Relation Age of Onset  . Colon cancer Neg Hx   . Breast cancer Other      GM  . Diabetes Father     F, B, S  . Pancreatic cancer Other     GM  . CAD Sister     MI at age 49  . CAD Brother     CHF, died at age 86  . Heart attack Brother     died suddenly @ 77  . CAD Father     died of MI @ 9  . Kidney disease Father    Social History  Substance Use Topics  . Smoking status: Never Smoker   . Smokeless  tobacco: Never Used  . Alcohol Use: No   OB History    No data available     Review of Systems  Constitutional: Positive for activity change. Negative for fever and chills.  HENT: Negative.   Respiratory: Negative.   Cardiovascular: Negative.   Musculoskeletal: Positive for myalgias and back pain. Negative for neck pain and neck stiffness.       As per HPI  Skin: Negative for color change.  Neurological: Negative.   All other systems reviewed and are negative.   Allergies  Vicodin  Home Medications   Prior to Admission medications   Medication Sig Start Date End Date Taking? Authorizing Provider  Ascorbic Acid  (VITAMIN C) 100 MG tablet Take 100 mg by mouth 2 (two) times daily.     Historical Provider, MD  baclofen (LIORESAL) 10 MG tablet Take 1 tablet as needed at onset of migraine. 01/20/15   Cameron Sprang, MD  benzonatate (TESSALON) 200 MG capsule Take 1 capsule (200 mg total) by mouth 3 (three) times daily as needed for cough. 04/28/15   Colon Branch, MD  budesonide-formoterol [facility]) 160-4.5 MCG/ACT inhaler Inhale 2 puffs into the lungs 2 (two) times daily. 04/16/15   Colon Branch, MD  canagliflozin [facility]) 300 MG TABS tablet Take 300 mg by mouth daily before breakfast. 11/18/14   Colon Branch, MD  cefUROXime (CEFTIN) 500 MG tablet Take 1 tablet (500 mg total) by mouth 2 (two) times daily with a meal. Patient not taking: Reported on 05/24/2015 04/28/15   Colon Branch, MD  fexofenadine (ALLEGRA) 180 MG tablet Take 180 mg by mouth daily.    Historical Provider, MD  folic acid (FOLVITE) 1 MG tablet Take 1 mg by mouth daily.    Historical Provider, MD  ibuprofen (ADVIL) 200 MG tablet Take 3 tablets (600 mg total) by mouth every 6 (six) hours as needed. 01/23/15   Azucena Fallen, MD  metFORMIN (GLUCOPHAGE) 1000 MG tablet Take 1 tablet (1,000 mg total) by mouth 2 (two) times daily with a meal. 07/24/14   Colon Branch, MD  naproxen (NAPROSYN) 375 MG tablet Take 1 tablet (375 mg total) by mouth 2 (two) times daily. 05/24/15   Janne Napoleon, NP  topiramate (TOPAMAX) 50 MG tablet Take 1 tablet in AM, 2 tablets in PM 04/22/15   Cameron Sprang, MD  vitamin B-12 (CYANOCOBALAMIN) 500 MCG tablet Take 1,000 mcg by mouth daily.     Historical Provider, MD   Meds Ordered and Administered this Visit  Medications - No data to display  BP 150/91 mmHg  Pulse 76  Temp(Src) 99.1 F (37.3 C) (Oral)  Resp 16  SpO2 98%  LMP 01/16/2015 No data found.   Physical Exam  Constitutional: She appears well-developed and well-nourished. No distress.  Neck:  Patient exhibits near full range of motion of the neck although she has no  pain or tenderness. She states there is some pain to the neck that is chronic resulting from remote surgery.  Cardiovascular: Normal rate.   Pulmonary/Chest: Effort normal. No respiratory distress.  Musculoskeletal:  Patient demonstrates ability to stand from a seated position. She is able to support her weight with her lower extremities. There is tenderness to the lightest palpation across the lower lumbosacral scan, at oppose and musculature. Lightly touching the dermis produces severe pain. There is no overlying discoloration or swelling. No palpable spinal deformity, swelling or discoloration.  Neurological: She is alert. She exhibits normal muscle tone.  Skin: Skin is warm and dry.  Nursing note and vitals reviewed.   ED Course  Procedures (including critical care time)  Labs Review Labs Reviewed - No data to display  Imaging Review Dg Lumbar Spine Complete  05/24/2015  CLINICAL DATA:  Fall down steps and lower back, with low back/lumbosacral pain. EXAM: LUMBAR SPINE - COMPLETE 4+ VIEW COMPARISON:  Lumbar spine MRI 05/06/2014, no prior lumbar spine radiographs FINDINGS: The alignment is maintained. Vertebral body heights are normal. There is no listhesis. The posterior elements are intact. Disc spaces are preserved. Trace endplate spurring in the mid lumbar spine. No fracture. Sacroiliac joints are symmetric, mild degenerative change, left greater than right. IMPRESSION: No fracture or subluxation of the lumbar spine. Electronically Signed   By: Jeb Levering M.D.   On: 05/24/2015 21:06     Visual Acuity Review  Right Eye Distance:   Left Eye Distance:   Bilateral Distance:    Right Eye Near:   Left Eye Near:    Bilateral Near:         MDM   1. Fall down steps, initial encounter   2. Bilateral low back pain without sciatica   3. Back contusion, unspecified laterality, initial encounter    Ice to the lower back for the next 2-3 days. Then use heat. When using heat  start small stretches as demonstrated. Naprosyn 375 twice a day when necessary pain take with food No work for the next couple days and no lifting or bending for the remainder of the work week. Follow-up with your PCP as needed.    Janne Napoleon, NP 05/24/15 2121

## 2015-05-24 NOTE — Discharge Instructions (Signed)
Back Pain, Adult °Back pain is very common in adults. The cause of back pain is rarely dangerous and the pain often gets better over time. The cause of your back pain may not be known. Some common causes of back pain include: °· Strain of the muscles or ligaments supporting the spine. °· Wear and tear (degeneration) of the spinal disks. °· Arthritis. °· Direct injury to the back. °For many people, back pain may return. Since back pain is rarely dangerous, most people can learn to manage this condition on their own. °HOME CARE INSTRUCTIONS °Watch your back pain for any changes. The following actions may help to lessen any discomfort you are feeling: °· Remain active. It is stressful on your back to sit or stand in one place for long periods of time. Do not sit, drive, or stand in one place for more than 30 minutes at a time. Take short walks on even surfaces as soon as you are able. Try to increase the length of time you walk each day. °· Exercise regularly as directed by your health care provider. Exercise helps your back heal faster. It also helps avoid future injury by keeping your muscles strong and flexible. °· Do not stay in bed. Resting more than 1-2 days can delay your recovery. °· Pay attention to your body when you bend and lift. The most comfortable positions are those that put less stress on your recovering back. Always use proper lifting techniques, including: °· Bending your knees. °· Keeping the load close to your body. °· Avoiding twisting. °· Find a comfortable position to sleep. Use a firm mattress and lie on your side with your knees slightly bent. If you lie on your back, put a pillow under your knees. °· Avoid feeling anxious or stressed. Stress increases muscle tension and can worsen back pain. It is important to recognize when you are anxious or stressed and learn ways to manage it, such as with exercise. °· Take medicines only as directed by your health care provider. Over-the-counter  medicines to reduce pain and inflammation are often the most helpful. Your health care provider may prescribe muscle relaxant drugs. These medicines help dull your pain so you can more quickly return to your normal activities and healthy exercise. °· Apply ice to the injured area: °· Put ice in a plastic bag. °· Place a towel between your skin and the bag. °· Leave the ice on for 20 minutes, 2-3 times a day for the first 2-3 days. After that, ice and heat may be alternated to reduce pain and spasms. °· Maintain a healthy weight. Excess weight puts extra stress on your back and makes it difficult to maintain good posture. °SEEK MEDICAL CARE IF: °· You have pain that is not relieved with rest or medicine. °· You have increasing pain going down into the legs or buttocks. °· You have pain that does not improve in one week. °· You have night pain. °· You lose weight. °· You have a fever or chills. °SEEK IMMEDIATE MEDICAL CARE IF:  °· You develop new bowel or bladder control problems. °· You have unusual weakness or numbness in your arms or legs. °· You develop nausea or vomiting. °· You develop abdominal pain. °· You feel faint. °  °This information is not intended to replace advice given to you by your health care provider. Make sure you discuss any questions you have with your health care provider. °  °Document Released: 06/26/2005 Document Revised: 07/17/2014 Document Reviewed: 10/28/2013 °Elsevier Interactive Patient Education ©2016 Elsevier   Inc.  Contusion A contusion is a deep bruise. Contusions happen when an injury causes bleeding under the skin. Symptoms of bruising include pain, swelling, and discolored skin. The skin may turn blue, purple, or yellow. HOME CARE   Rest the injured area.  If told, put ice on the injured area.  Put ice in a plastic bag.  Place a towel between your skin and the bag.  Leave the ice on for 20 minutes, 2-3 times per day.  If told, put light pressure (compression) on  the injured area using an elastic bandage. Make sure the bandage is not too tight. Remove it and put it back on as told by your doctor.  If possible, raise (elevate) the injured area above the level of your heart while you are sitting or lying down.  Take over-the-counter and prescription medicines only as told by your doctor. GET HELP IF:  Your symptoms do not get better after several days of treatment.  Your symptoms get worse.  You have trouble moving the injured area. GET HELP RIGHT AWAY IF:   You have very bad pain.  You have a loss of feeling (numbness) in a hand or foot.  Your hand or foot turns pale or cold.   This information is not intended to replace advice given to you by your health care provider. Make sure you discuss any questions you have with your health care provider.   Document Released: 12/13/2007 Document Revised: 03/17/2015 Document Reviewed: 11/11/2014 Elsevier Interactive Patient Education [employer].

## 2015-05-24 NOTE — ED Notes (Signed)
Reports falling on steps at home.  Left knee buckled and she fell down steps, striking back on stairs.  Currently complains of lower back pain and pain into both legs.  Denies bowel or bladder concerns.

## 2015-05-26 ENCOUNTER — Other Ambulatory Visit: Payer: No Typology Code available for payment source

## 2015-05-28 ENCOUNTER — Encounter: Payer: Self-pay | Admitting: Physical Medicine & Rehabilitation

## 2015-05-28 ENCOUNTER — Encounter: Payer: No Typology Code available for payment source | Attending: Physical Medicine & Rehabilitation

## 2015-05-28 ENCOUNTER — Ambulatory Visit (HOSPITAL_BASED_OUTPATIENT_CLINIC_OR_DEPARTMENT_OTHER): Payer: No Typology Code available for payment source | Admitting: Physical Medicine & Rehabilitation

## 2015-05-28 VITALS — BP 151/93 | HR 77

## 2015-05-28 DIAGNOSIS — K219 Gastro-esophageal reflux disease without esophagitis: Secondary | ICD-10-CM | POA: Insufficient documentation

## 2015-05-28 DIAGNOSIS — E119 Type 2 diabetes mellitus without complications: Secondary | ICD-10-CM | POA: Diagnosis not present

## 2015-05-28 DIAGNOSIS — M25569 Pain in unspecified knee: Secondary | ICD-10-CM | POA: Diagnosis not present

## 2015-05-28 DIAGNOSIS — Z803 Family history of malignant neoplasm of breast: Secondary | ICD-10-CM | POA: Insufficient documentation

## 2015-05-28 DIAGNOSIS — M961 Postlaminectomy syndrome, not elsewhere classified: Secondary | ICD-10-CM | POA: Insufficient documentation

## 2015-05-28 DIAGNOSIS — M545 Low back pain, unspecified: Secondary | ICD-10-CM

## 2015-05-28 DIAGNOSIS — Z8249 Family history of ischemic heart disease and other diseases of the circulatory system: Secondary | ICD-10-CM | POA: Insufficient documentation

## 2015-05-28 DIAGNOSIS — M5127 Other intervertebral disc displacement, lumbosacral region: Secondary | ICD-10-CM | POA: Diagnosis not present

## 2015-05-28 DIAGNOSIS — Z8489 Family history of other specified conditions: Secondary | ICD-10-CM | POA: Insufficient documentation

## 2015-05-28 DIAGNOSIS — E669 Obesity, unspecified: Secondary | ICD-10-CM | POA: Diagnosis not present

## 2015-05-28 DIAGNOSIS — I10 Essential (primary) hypertension: Secondary | ICD-10-CM | POA: Insufficient documentation

## 2015-05-28 DIAGNOSIS — D571 Sickle-cell disease without crisis: Secondary | ICD-10-CM | POA: Diagnosis not present

## 2015-05-28 DIAGNOSIS — G894 Chronic pain syndrome: Secondary | ICD-10-CM | POA: Diagnosis not present

## 2015-05-28 DIAGNOSIS — G8929 Other chronic pain: Secondary | ICD-10-CM | POA: Diagnosis not present

## 2015-05-28 DIAGNOSIS — Z800 Family history of malignant neoplasm of digestive organs: Secondary | ICD-10-CM | POA: Diagnosis not present

## 2015-05-28 DIAGNOSIS — M549 Dorsalgia, unspecified: Secondary | ICD-10-CM | POA: Diagnosis present

## 2015-05-28 NOTE — Progress Notes (Signed)
Subjective:    Patient ID: [name], female    DOB: 04/29/1966, 49 y.o.   MRN: [id number]  HPI 49 year old morbidly obese female with long history of low back pain. She has had left-sided grip and right-sided pain. Good relief after L2 L3 L4 medial branch and L5 dorsal ramus injection under fluoroscopic guidance in September. She had 7 weeks of relief when she fell and experienced an increase in back pain. She has not seen a doctor for this. She states that both sides of her back hurt now. She has some radiation of pain to her lower extremities as well. No weakness in the legs. No hip or knee or ankle pain. Pain Inventory Average Pain NA Pain Right Now 8 My pain is sharp and stabbing  In the last 24 hours, has pain interfered with the following? General activity 7 Relation with others 7 Enjoyment of life 7 What TIME of day is your pain at its worst? Evening and Night Sleep (in general) Poor  Pain is worse with: walking, bending, sitting and standing Pain improves with: NA Relief from Meds: 0  Mobility walk with assistance use a cane  Function Do you have any goals in this area?  no  Neuro/Psych weakness spasms  Prior Studies Any changes since last visit?  no  Physicians involved in your care Any changes since last visit?  no   Family History  Problem Relation Age of Onset  . Colon cancer Neg Hx   . Breast cancer Other      GM  . Diabetes Father     F, B, S  . Pancreatic cancer Other     GM  . CAD Sister     MI at age 85  . CAD Brother     CHF, died at age 44  . Heart attack Brother     died suddenly @ 68  . CAD Father     died of MI @ 75  . Kidney disease Father    Social History   Social History  . Marital Status: Divorced    Spouse Name: N/A  . Number of Children: 2  . Years of Education: N/A   Occupational History  . applied for disability, FT student    Social History Main Topics  . Smoking status: Never Smoker   . Smokeless  tobacco: Never Used  . Alcohol Use: No  . Drug Use: No  . Sexual Activity: Not Currently    Birth Control/ Protection: None   Other Topics Concern  . None   Social History Narrative   Lives in [place] by herself        Past Surgical History  Procedure Laterality Date  . Shoulder arthroscopy w/ rotator cuff repair Right 2006, 2007  . Umbilical hernia repair  2001, 2003  . Wisdom tooth extraction  1985  . Anterior cervical decomp/discectomy fusion  08/27/2013  . Cholecystectomy  06/1989  . Hernia repair    . Knee arthroscopy Left 08/2003  . Tubal ligation  1991  . Anterior cervical decomp/discectomy fusion N/A 08/27/2013    Procedure: ANTERIOR CERVICAL /DISCECTOMY FUSION (ACDF) C5-C7  2 LEVELS;  Surgeon: Melina Schools, MD;  Location: [facility];  Service: Orthopedics;  Laterality: N/A;  . Left heart catheterization with coronary angiogram N/A 04/15/2013    Procedure: LEFT HEART CATHETERIZATION WITH CORONARY ANGIOGRAM;  Surgeon: Peter M Martinique, MD;  Location: [facility] CATH LAB;  Service: Cardiovascular;  Laterality: N/A;  . Breast reduction surgery  Bilateral 09/28/2014    Procedure: MAMMARY REDUCTION  (BREAST) BILATERAL;  Surgeon: Cristine Polio, MD;  Location: [place];  Service: Plastics;  Laterality: Bilateral;  . Liposuction Bilateral 09/28/2014    Procedure: LIPOSUCTION;  Surgeon: Cristine Polio, MD;  Location: [facility];  Service: Plastics;  Laterality: Bilateral;  . Breast surgery    . Dilation and curettage of uterus    . Robotic assisted total hysterectomy N/A 01/22/2015    Procedure: ROBOTIC ASSISTED TOTAL HYSTERECTOMY With Pelvic Washings, Lysis of Adhesions;  Surgeon: Azucena Fallen, MD;  Location: [facility];  Service: Gynecology;  Laterality: N/A;  . Laparoscopic bilateral salpingectomy Bilateral 01/22/2015    Procedure: LAPAROSCOPIC BILATERAL SALPINGECTOMY;  Surgeon: Azucena Fallen, MD;  Location: [facility];  Service: Gynecology;  Laterality: Bilateral;   Past  Medical History  Diagnosis Date  . Obesity   . GERD (gastroesophageal reflux disease)   . Anxiety state, unspecified 05/16/2013  . Sickle cell trait ([place])   . Asthma     "only when I get a sinus infection which is 1-2 X/yr" (08/27/2013)  . Diabetes ([place])     "borderline" (08/27/2013)  . Migraine     "once or twice/month now" (08/27/2013)  . Arthritis     "right knee" (08/27/2013)  . Hypertension     no meds  . Wears glasses    BP 151/93 mmHg  Pulse 77  SpO2 97%  LMP 01/16/2015  Opioid Risk Score:   Fall Risk Score:  `1  Depression screen PHQ 2/9  Depression screen [facility] 2/9 04/16/2015 01/18/2015 02/14/2013  Decreased Interest 0 0 0  Down, Depressed, Hopeless 0 0 0  PHQ - 2 Score 0 0 0  Altered sleeping - 2 -  Tired, decreased energy - 2 -  Change in appetite - 2 -  Feeling bad or failure about yourself  - 0 -  Trouble concentrating - 0 -  Moving slowly or fidgety/restless - 0 -  Suicidal thoughts - 0 -  PHQ-9 Score - 6 -     Review of Systems  Musculoskeletal:       Spasms  Neurological: Positive for weakness.  All other systems reviewed and are negative.      Objective:   Physical Exam  Constitutional: She is oriented to person, place, and time. She appears well-developed and well-nourished.  HENT:  Head: Normocephalic and atraumatic.  Eyes: Conjunctivae and EOM are normal. Pupils are equal, round, and reactive to light.  Neck: Normal range of motion.  Musculoskeletal:       Right knee: She exhibits deformity. No tenderness found.       Left knee: She exhibits deformity. She exhibits no swelling and no effusion. No tenderness found.       Right ankle: Normal.       Left ankle: Normal.       Lumbar back: She exhibits decreased range of motion, tenderness and spasm. She exhibits no deformity.  Neurological: She is alert and oriented to person, place, and time.  Psychiatric: She has a normal mood and affect.  Nursing note and vitals reviewed.           Assessment & Plan:  1. Lumbar spondylosis with chronic low back pain. She had excellent relief with left-sided L2 L3 L4 medial branch and L5 dorsal ramus injection. She's had a fall but I do not think she has a new injury it appears she has flared up her chronic low back pain. Would recommend  trial of baclofen which she already has as a muscle relaxer. She continues to use nonsteroidal anti-inflammatories as well. In addition we'll set her up for repeat medial branch blocks as these were helpful in her situation for prolonged period of time. May have to do bilateral.

## 2015-05-28 NOTE — Patient Instructions (Addendum)
Try Arnica cream Or icy hot with lidocaine  ALTERNATE HER POSITIONS FREQUENTLY DO NOT LAY DOWN TO LONG DO NOT STAND TOO LONG AND DO NOT SIT TOO LONG TRY TO MOVE AROUND EVERY 15 MINUTES OR SO  Try baclofen for muscle spasms

## 2015-06-17 ENCOUNTER — Ambulatory Visit
Admission: RE | Admit: 2015-06-17 | Discharge: 2015-06-17 | Disposition: A | Discharge status: Home / Self Care | Payer: No Typology Code available for payment source | Source: Ambulatory Visit | Attending: Orthopedic Surgery | Admitting: Orthopedic Surgery

## 2015-06-17 ENCOUNTER — Telehealth: Payer: Self-pay | Admitting: *Deleted

## 2015-06-17 DIAGNOSIS — Z4789 Encounter for other orthopedic aftercare: Secondary | ICD-10-CM

## 2015-06-17 MED ORDER — METHYLPREDNISOLONE 4 MG PO TBPK: ORAL_TABLET | ORAL | Status: DC

## 2015-06-17 MED ORDER — GADOBENATE DIMEGLUMINE 529 MG/ML IV SOLN
20.0000 mL | Freq: Once | INTRAVENOUS | Status: AC | PRN
  Administered 2015-06-17: 20 mL via INTRAVENOUS

## 2015-06-17 NOTE — Telephone Encounter (Signed)
Sent to pharmacy and pt notified.

## 2015-06-17 NOTE — Telephone Encounter (Signed)
Call in [place]

## 2015-06-17 NOTE — Telephone Encounter (Signed)
[name] called and has had to reschedule her appt for her injection for tomorrow and first available is 07/20/15. The tramadol and baclofen are not really helping with the pain and she is wondering if there is something else she can do until that time.  Please advise

## 2015-06-18 ENCOUNTER — Ambulatory Visit: Payer: No Typology Code available for payment source | Admitting: Physical Medicine & Rehabilitation

## 2015-06-25 ENCOUNTER — Ambulatory Visit: Payer: No Typology Code available for payment source | Admitting: Physical Medicine & Rehabilitation

## 2015-07-20 ENCOUNTER — Ambulatory Visit: Payer: No Typology Code available for payment source | Admitting: Physical Medicine & Rehabilitation

## 2015-08-09 ENCOUNTER — Encounter: Payer: Self-pay | Admitting: Physical Medicine & Rehabilitation

## 2015-08-09 ENCOUNTER — Encounter: Payer: BLUE CROSS/BLUE SHIELD | Attending: Physical Medicine & Rehabilitation

## 2015-08-09 ENCOUNTER — Other Ambulatory Visit: Payer: Self-pay | Admitting: *Deleted

## 2015-08-09 ENCOUNTER — Ambulatory Visit: Payer: No Typology Code available for payment source | Admitting: Internal Medicine

## 2015-08-09 ENCOUNTER — Ambulatory Visit (HOSPITAL_BASED_OUTPATIENT_CLINIC_OR_DEPARTMENT_OTHER): Payer: BLUE CROSS/BLUE SHIELD | Admitting: Physical Medicine & Rehabilitation

## 2015-08-09 VITALS — BP 158/91 | HR 86

## 2015-08-09 DIAGNOSIS — I10 Essential (primary) hypertension: Secondary | ICD-10-CM | POA: Diagnosis not present

## 2015-08-09 DIAGNOSIS — Z800 Family history of malignant neoplasm of digestive organs: Secondary | ICD-10-CM | POA: Diagnosis not present

## 2015-08-09 DIAGNOSIS — Z8249 Family history of ischemic heart disease and other diseases of the circulatory system: Secondary | ICD-10-CM | POA: Insufficient documentation

## 2015-08-09 DIAGNOSIS — G894 Chronic pain syndrome: Secondary | ICD-10-CM | POA: Insufficient documentation

## 2015-08-09 DIAGNOSIS — M961 Postlaminectomy syndrome, not elsewhere classified: Secondary | ICD-10-CM | POA: Insufficient documentation

## 2015-08-09 DIAGNOSIS — M549 Dorsalgia, unspecified: Secondary | ICD-10-CM | POA: Diagnosis present

## 2015-08-09 DIAGNOSIS — Z8489 Family history of other specified conditions: Secondary | ICD-10-CM | POA: Insufficient documentation

## 2015-08-09 DIAGNOSIS — D571 Sickle-cell disease without crisis: Secondary | ICD-10-CM | POA: Insufficient documentation

## 2015-08-09 DIAGNOSIS — E119 Type 2 diabetes mellitus without complications: Secondary | ICD-10-CM | POA: Insufficient documentation

## 2015-08-09 DIAGNOSIS — E669 Obesity, unspecified: Secondary | ICD-10-CM | POA: Diagnosis not present

## 2015-08-09 DIAGNOSIS — M5127 Other intervertebral disc displacement, lumbosacral region: Secondary | ICD-10-CM | POA: Insufficient documentation

## 2015-08-09 DIAGNOSIS — M47816 Spondylosis without myelopathy or radiculopathy, lumbar region: Secondary | ICD-10-CM | POA: Diagnosis not present

## 2015-08-09 DIAGNOSIS — M25569 Pain in unspecified knee: Secondary | ICD-10-CM | POA: Diagnosis not present

## 2015-08-09 DIAGNOSIS — Z803 Family history of malignant neoplasm of breast: Secondary | ICD-10-CM | POA: Diagnosis not present

## 2015-08-09 DIAGNOSIS — K219 Gastro-esophageal reflux disease without esophagitis: Secondary | ICD-10-CM | POA: Insufficient documentation

## 2015-08-09 MED ORDER — DIAZEPAM 10 MG PO TABS: ORAL_TABLET | ORAL | Status: DC

## 2015-08-09 NOTE — Progress Notes (Signed)
bilateral Lumbar L2, L3, L4  medial branch blocks and L 5 dorsal ramus injection under fluoroscopic guidance   Indication: Left Lumbar pain which is not relieved by medication management or other conservative care and interfering with self-care and mobility.  Informed consent was obtained after describing risks and benefits of the procedure with the patient, this includes bleeding, bruising, infection, paralysis and medication side effects.  The patient wishes to proceed and has given written consent.  The patient was placed in a prone position.  The lumbar area was marked and prepped with Betadine.  One mL of 1% lidocaine was injected into each of 3 areas into the skin and subcutaneous tissue.  Then a 22-gauge 5 inch spinal needle was inserted targeting the junction of the left S1 superior articular process and sacral ala junction.  Needle was advanced under fluoroscopic guidance.  Bone contact was made.  Omnipaque 180 was injected x 0.5 mL demonstrating no intravascular uptake.  Then a solution containing one mL of 4 mg per mL dexamethasone and 3 mL of 2% MPF lidocaine was injected x 0.5 mL.  Then the left L5 superior articular process in transverse process junction was targeted.  Bone contact was made.  Omnipaque 180 was injected x 0.5 mL demonstrating no intravascular uptake.  Then a solution containing one mL of 4 mg per mL dexamethasone and 3 mL of 2% MPF lidocaine was injected x 0.5 mL.  Then the left L4 superior articular process in transverse process junction was targeted.  Bone contact was made.  Omnipaque 180 was injected x 0.5 mL demonstrating no intravascular uptake.  Then a solution containing one mL of 4 mg per mL dexamethasone and 3 mL of 2% MPF lidocaine was injected x 0.5 mL. Then the left L3 superior articular process in transverse process junction was targeted.  Bone contact was made.  Omnipaque 180 was injected x 0.5 mL demonstrating no intravascular uptake.  Then a solution containing one  mL of 4 mg per mL dexamethasone and 3 mL of 2% MPF lidocaine was injected x 0.5 mL.The same procedure was done on the right side using same needle injectate and technique  Patient tolerated procedure well.  Post procedure instructions were given.  Preinjection pain 8/10 Postinjection pain 6/10, will return to clinic in 6 weeks to further assess efficacy

## 2015-08-09 NOTE — Patient Instructions (Signed)

## 2015-08-09 NOTE — Progress Notes (Signed)
  [facility]   Name: JORDYAN SARTELL DOB:Aug 01, 1965 MRN: [id number]  Date:08/09/2015  Physician: Alysia Penna, MD    Nurse/CMA: Shumaker RN  Allergies:  Allergies  Allergen Reactions  . Vicodin [Hydrocodone-Acetaminophen] Swelling and Rash    Pt is able to take percocet without problems    Consent Signed: Yes.    Is patient diabetic? Yes.    CBG today?142  Pregnant: No. LMP: Patient's last menstrual period was 01/16/2015. (age 89-55)  Anticoagulants: no Anti-inflammatory: no Antibiotics: no  Procedure:bilateral medial branch block L 3-4-5 Position: Prone Start Time: 12:56  End Time: 1:15  Fluoro Time: 1:24 sec  RN/CMA Biomedical engineer    Time 12:46 1:18    BP 158/91 155/104    Pulse 86 86    Respirations 14 16    O2 Sat 100 98    S/S 6 6    Pain Level 8/10 6/10     D/C home with daughter, patient A & O X 3, D/C instructions reviewed, and sits independently.

## 2015-08-23 ENCOUNTER — Encounter: Payer: Self-pay | Admitting: Neurology

## 2015-08-23 ENCOUNTER — Ambulatory Visit (INDEPENDENT_AMBULATORY_CARE_PROVIDER_SITE_OTHER): Payer: BLUE CROSS/BLUE SHIELD | Admitting: Neurology

## 2015-08-23 VITALS — BP 132/90 | HR 80 | Ht 65.0 in | Wt 295.0 lb

## 2015-08-23 DIAGNOSIS — G43109 Migraine with aura, not intractable, without status migrainosus: Secondary | ICD-10-CM

## 2015-08-23 MED ORDER — GABAPENTIN 300 MG PO CAPS: ORAL_CAPSULE | ORAL | Status: DC

## 2015-08-23 MED ORDER — BACLOFEN 10 MG PO TABS: ORAL_TABLET | ORAL | Status: DC

## 2015-08-23 MED ORDER — TOPIRAMATE 50 MG PO TABS: ORAL_TABLET | ORAL | Status: DC

## 2015-08-23 NOTE — Patient Instructions (Signed)
1. Start low dose Gabapentin 300mg : Take 1 capsule at night 2. Continue Topamax 50mg : Take 1 tablet in AM, 2 tablets in PM 3. Take Baclofen as needed for migraines 4. Continue follow-up with your Orthopedic doctor 5. Follow-up in 7 months, call for any changes

## 2015-08-23 NOTE — Progress Notes (Signed)
NEUROLOGY FOLLOW UP OFFICE NOTE  MIRENDA FLIER EZ:[id number]  HISTORY OF PRESENT ILLNESS: I had the pleasure of seeing Estelle Mantilla in follow-up in the neurology clinic on 08/23/2015.  The patient was last seen 4 months ago for worsening migraines. On her last visit, Topamax dose was increased to 50mg  in AM, 100mg  in PM. She reports that it is helping, she has migraines twice a month (previously twice a week), intensity has gone done from a 9 to a 7 over 10. Baclofen prn helps. Her last migraine was a week ago. Stress is a trigger. With increase in Topamax, she has noticed some cognitive slowing and word-finding difficulties. Over the past 3-4 months, she has also been having left-sided neck pain, with shooting pain radiating down her arms, left more than right. If she was holding something when this happens, she would drop it. She had an MRI C-spine done in December with Ortho which showed post-surgical changes with mild cord atrophy at that level. 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 She has called Dr. Rolena Infante and awaits follow-up. She has occasional blurred vision and seeing flashes of lights, and will be seeing her eye doctor soon. She fell a few months ago and has had low back pain, receiving back injections. She reports sleep has been challenging due to neck pain, her neck starts feeling tired then her head heavy when she works on the computer. She denies any dizziness, no falls.   HPI: This is a pleasant 50 yo RH woman with a history of morbid obesity, hypertension, diabetes, chronic back pain, and migraines since age 57, who presented for worsening migraines. Migraines usually start in the back of her head then radiate to the frontal regions, with 10/10 sharp, stabbing, and throbbing  pain. There is associated nausea, vomiting, photo and phonophobia, neck pain, as well as occasional dizziness and blurred vision. At times, migraines are preceded by flashing lights. She was having them once a month, however since May 2016, headaches increased in frequency to twice a week. She had found that they would occur more a day or two prior to her cycle. Since April, she has noticed a change in her menstrual period, she had a hysterectomy last July 2016. Other triggers include stress and weather changes. She has been taking Topamax for the past 10 years, increased to 50mg  daily last year. She had been seeing neurologist Dr. Domingo Cocking [facility] until they stopped taking her insurance. She had been receiving trigger point injections in the past, which did help with headaches, but after one session where she had severe neck pain, she stopped this. She does not recall other preventatives tried, but recalls they made her sleep (she mentions ?Valium). Imitrex helped little. Dr. Domingo Cocking had prescribed prn Baclofen for migraine rescue, which takes the edge off. She takes Tramadol for her left sciatica. She tries to get 8 hours of sleep. There is no family history of migraines. She denies any significant head injuries.   I personally reviewed head CT done 09/2011 which was normal.   PAST MEDICAL HISTORY: Past Medical History  Diagnosis Date  . Obesity   . GERD (gastroesophageal reflux disease)   . Anxiety state, unspecified 05/16/2013  . Sickle cell trait ([place])   . Asthma     "only when I get  a sinus infection which is 1-2 X/yr" (08/27/2013)  . Diabetes ([place])     "borderline" (08/27/2013)  . Migraine     "once or twice/month now" (08/27/2013)  . Arthritis     "right knee" (08/27/2013)  . Hypertension     no meds  . Wears glasses     MEDICATIONS: Current Outpatient Prescriptions on File Prior to Visit  Medication Sig Dispense Refill  . Ascorbic Acid (VITAMIN C) 100 MG  tablet Take 100 mg by mouth 2 (two) times daily.     . baclofen (LIORESAL) 10 MG tablet Take 1 tablet as needed at onset of migraine. 20 each 5  . budesonide-formoterol (SYMBICORT) 160-4.5 MCG/ACT inhaler Inhale 2 puffs into the lungs 2 (two) times daily. 1 Inhaler 3  . canagliflozin (INVOKANA) 300 MG TABS tablet Take 300 mg by mouth daily before breakfast. 30 tablet 3  . fexofenadine (ALLEGRA) 180 MG tablet Take 180 mg by mouth daily.    . folic acid (FOLVITE) 1 MG tablet Take 1 mg by mouth daily.    Marland Kitchen ibuprofen (ADVIL) 200 MG tablet Take 3 tablets (600 mg total) by mouth every 6 (six) hours as needed. 30 tablet 0  . metFORMIN (GLUCOPHAGE) 1000 MG tablet Take 1 tablet (1,000 mg total) by mouth 2 (two) times daily with a meal. 60 tablet 10  . naproxen (NAPROSYN) 375 MG tablet Take 1 tablet (375 mg total) by mouth 2 (two) times daily. (Patient taking differently: Take 375 mg by mouth 2 (two) times daily as needed. ) 20 tablet 0  . topiramate (TOPAMAX) 50 MG tablet Take 1 tablet in AM, 2 tablets in PM 270 tablet 3  . vitamin B-12 (CYANOCOBALAMIN) 500 MCG tablet Take 1,000 mcg by mouth daily.      No current facility-administered medications on file prior to visit.    ALLERGIES: Allergies  Allergen Reactions  . Vicodin [Hydrocodone-Acetaminophen] Swelling and Rash    Pt is able to take percocet without problems    FAMILY HISTORY: Family History  Problem Relation Age of Onset  . Colon cancer Neg Hx   . Breast cancer Other      GM  . Diabetes Father     F, B, S  . Pancreatic cancer Other     GM  . CAD Sister     MI at age 71  . CAD Brother     CHF, died at age 45  . Heart attack Brother     died suddenly @ 81  . CAD Father     died of MI @ 59  . Kidney disease Father     SOCIAL HISTORY: Social History   Social History  . Marital Status: Divorced    Spouse Name: N/A  . Number of Children: 2  . Years of Education: N/A   Occupational History  . applied for disability, FT  student    Social History Main Topics  . Smoking status: Never Smoker   . Smokeless tobacco: Never Used  . Alcohol Use: No  . Drug Use: No  . Sexual Activity: Not Currently    Birth Control/ Protection: None   Other Topics Concern  . Not on file   Social History Narrative   Lives in [place] by herself         REVIEW OF SYSTEMS: Constitutional: No fevers, chills, or sweats, no generalized fatigue, change in appetite Eyes: No visual changes, double vision, eye pain Ear, nose and throat: No hearing loss, ear  pain, nasal congestion, sore throat Cardiovascular: No chest pain, palpitations Respiratory:  No shortness of breath at rest or with exertion, wheezes GastrointestinaI: No nausea, vomiting, diarrhea, abdominal pain, fecal incontinence Genitourinary:  No dysuria, urinary retention or frequency Musculoskeletal:  No neck pain, back pain Integumentary: No rash, pruritus, skin lesions Neurological: as above Psychiatric: No depression, insomnia, anxiety Endocrine: No palpitations, fatigue, diaphoresis, mood swings, change in appetite, change in weight, increased thirst Hematologic/Lymphatic:  No anemia, purpura, petechiae. Allergic/Immunologic: no itchy/runny eyes, nasal congestion, recent allergic reactions, rashes  PHYSICAL EXAM: Filed Vitals:   08/23/15 0821  BP: 132/90  Pulse: 80   General: No acute distress Head:  Normocephalic/atraumatic Neck: supple, no paraspinal tenderness, full range of motion Heart:  Regular rate and rhythm Lungs:  Clear to auscultation bilaterally Back: No paraspinal tenderness Skin/Extremities: No rash, no edema Neurological Exam: alert and oriented to person, place, and time. No aphasia or dysarthria. Fund of knowledge is appropriate.  Recent and remote memory are intact.Attention and concentration are normal.    Able to name objects and repeat phrases. Cranial nerves: Pupils equal, round, reactive to light.  Fundoscopic exam unremarkable, no  papilledema. Extraocular movements intact with no nystagmus. Visual fields full. Facial sensation intact. No facial asymmetry. Tongue, uvula, palate midline.  Motor: Bulk and tone normal, muscle strength 5/5 throughout with no pronator drift.  Sensation to light touch intact.  No extinction to double simultaneous stimulation.  Deep tendon reflexes 2+ throughout, toes downgoing.  Finger to nose testing intact.  Gait narrow-based and steady, able to tandem walk adequately.  Romberg negative.  IMPRESSION: This is a 50 yo RH woman with a history of morbid obesity, hypertension, diabetes, chronic back pain, and migraines since age 23, who presented for increased migraines. Hysterectomy did not help with improving migraines. She has noticed some improvement in headaches with Topamax to 50mg  in AM, 100mg  in PM but has had some cognitive slowing and is hesitant to further increase dose. With cervical radiculopathy on MRI, would do a re-trial of gabapentin for headache prophylaxis and radiculopathy. Side effects were discussed, she will start low dose 300mg  qhs and uptitrate as tolerated. She will follow-up with her orthopedic surgeon for abnormalities on MRI C-spine. Continue prn Baclofen for headache rescue. She knows to minimize rescue medications to 2-3 a week to avoid rebound headaches. She will contiue to keep a headache calendar and follow-up in 7 months.   Thank you for allowing me to participate in her care.  Please do not hesitate to call for any questions or concerns.  The duration of this appointment visit was 25 minutes of face-to-face time with the patient.  Greater than 50% of this time was spent in counseling, explanation of diagnosis, planning of further management, and coordination of care.   Ellouise Newer, M.D.   CC: Dr. Larose Kells

## 2015-08-30 ENCOUNTER — Other Ambulatory Visit: Payer: Self-pay | Admitting: Internal Medicine

## 2015-09-06 ENCOUNTER — Other Ambulatory Visit: Payer: Self-pay | Admitting: Orthopedic Surgery

## 2015-09-06 DIAGNOSIS — M5104 Intervertebral disc disorders with myelopathy, thoracic region: Secondary | ICD-10-CM

## 2015-09-15 ENCOUNTER — Inpatient Hospital Stay: Admission: RE | Admit: 2015-09-15 | Payer: BLUE CROSS/BLUE SHIELD | Source: Ambulatory Visit

## 2015-09-16 ENCOUNTER — Ambulatory Visit
Admission: RE | Admit: 2015-09-16 | Discharge: 2015-09-16 | Disposition: A | Discharge status: Home / Self Care | Payer: BLUE CROSS/BLUE SHIELD | Source: Ambulatory Visit | Attending: Orthopedic Surgery | Admitting: Orthopedic Surgery

## 2015-09-16 DIAGNOSIS — M5104 Intervertebral disc disorders with myelopathy, thoracic region: Secondary | ICD-10-CM

## 2015-09-20 ENCOUNTER — Ambulatory Visit (HOSPITAL_BASED_OUTPATIENT_CLINIC_OR_DEPARTMENT_OTHER): Payer: BLUE CROSS/BLUE SHIELD | Admitting: Physical Medicine & Rehabilitation

## 2015-09-20 ENCOUNTER — Encounter: Payer: Self-pay | Admitting: Physical Medicine & Rehabilitation

## 2015-09-20 ENCOUNTER — Encounter: Payer: BLUE CROSS/BLUE SHIELD | Attending: Physical Medicine & Rehabilitation

## 2015-09-20 VITALS — BP 146/94 | HR 90

## 2015-09-20 DIAGNOSIS — K219 Gastro-esophageal reflux disease without esophagitis: Secondary | ICD-10-CM | POA: Insufficient documentation

## 2015-09-20 DIAGNOSIS — M5127 Other intervertebral disc displacement, lumbosacral region: Secondary | ICD-10-CM | POA: Insufficient documentation

## 2015-09-20 DIAGNOSIS — G8929 Other chronic pain: Secondary | ICD-10-CM

## 2015-09-20 DIAGNOSIS — M545 Low back pain, unspecified: Secondary | ICD-10-CM

## 2015-09-20 DIAGNOSIS — G894 Chronic pain syndrome: Secondary | ICD-10-CM | POA: Diagnosis not present

## 2015-09-20 DIAGNOSIS — E669 Obesity, unspecified: Secondary | ICD-10-CM | POA: Insufficient documentation

## 2015-09-20 DIAGNOSIS — Z803 Family history of malignant neoplasm of breast: Secondary | ICD-10-CM | POA: Diagnosis not present

## 2015-09-20 DIAGNOSIS — M549 Dorsalgia, unspecified: Secondary | ICD-10-CM | POA: Diagnosis present

## 2015-09-20 DIAGNOSIS — Z800 Family history of malignant neoplasm of digestive organs: Secondary | ICD-10-CM | POA: Diagnosis not present

## 2015-09-20 DIAGNOSIS — Z8489 Family history of other specified conditions: Secondary | ICD-10-CM | POA: Diagnosis not present

## 2015-09-20 DIAGNOSIS — E119 Type 2 diabetes mellitus without complications: Secondary | ICD-10-CM | POA: Insufficient documentation

## 2015-09-20 DIAGNOSIS — Z8249 Family history of ischemic heart disease and other diseases of the circulatory system: Secondary | ICD-10-CM | POA: Diagnosis not present

## 2015-09-20 DIAGNOSIS — D571 Sickle-cell disease without crisis: Secondary | ICD-10-CM | POA: Diagnosis not present

## 2015-09-20 DIAGNOSIS — M25569 Pain in unspecified knee: Secondary | ICD-10-CM | POA: Insufficient documentation

## 2015-09-20 DIAGNOSIS — M533 Sacrococcygeal disorders, not elsewhere classified: Secondary | ICD-10-CM | POA: Diagnosis not present

## 2015-09-20 DIAGNOSIS — M961 Postlaminectomy syndrome, not elsewhere classified: Secondary | ICD-10-CM | POA: Diagnosis not present

## 2015-09-20 DIAGNOSIS — I10 Essential (primary) hypertension: Secondary | ICD-10-CM | POA: Insufficient documentation

## 2015-09-20 MED ORDER — ACETAMINOPHEN-CODEINE #3 300-30 MG PO TABS: 1.0000 | ORAL_TABLET | Freq: Three times a day (TID) | ORAL | Status: DC | PRN

## 2015-09-20 NOTE — Patient Instructions (Signed)
Will schedule for a different type of injection, sacroiliac in 2 weeks.  In the meantime will start a new pain medication, Tylenol with codeine take up to 3 tablets per day as needed. You can take this in combination with the ibuprofen

## 2015-09-20 NOTE — Progress Notes (Signed)
Subjective:    Patient ID: [name], female    DOB: Mar 22, 1966, 50 y.o.   MRN: [id number]  HPI Patient here for follow-up of low back pain. She had lumbar medial branch blocks bilateral L2 L3 L4 as well as bilateral L5 dorsal ramus injections initially in September 2016 where she went from a 7 out of 10-0 out of 10 then repeated again in 6 weeks ago and the patient went from an 8 out of 10 to a 6 out of 10.  Patient also states she is seeing Dr. Rolena Infante for her neck pain. She is considering surgery at the level either above or below her prior cervical fusion Recent MRI of the thoracic and cervical spine 09/14/2015. There is C4-5 canal stenosis and foraminal stenosis mainly on the left.  Thoracic spine MRI has a disc at T2-3 but this is not causing any neural impingement.  Pain Inventory Average Pain 9 Pain Right Now 7 My pain is sharp, stabbing and aching  In the last 24 hours, has pain interfered with the following? General activity 7 Relation with others 7 Enjoyment of life 7 What TIME of day is your pain at its worst? night Sleep (in general) Poor  Pain is worse with: walking, sitting and standing Pain improves with: medication Relief from Meds: 2  Mobility walk with assistance use a cane how many minutes can you walk? 20 ability to climb steps?  yes do you drive?  yes Do you have any goals in this area?  yes  Function I need assistance with the following:  household duties and shopping  Neuro/Psych numbness tingling trouble walking spasms  Prior Studies Any changes since last visit?  yes CT/MRI  Physicians involved in your care Any changes since last visit?  no   Family History  Problem Relation Age of Onset  . Colon cancer Neg Hx   . Breast cancer Other      GM  . Diabetes Father     F, B, S  . Pancreatic cancer Other     GM  . CAD Sister     MI at age 67  . CAD Brother     CHF, died at age 38  . Heart attack Brother     died suddenly @  9  . CAD Father     died of MI @ 89  . Kidney disease Father    Social History   Social History  . Marital Status: Divorced    Spouse Name: N/A  . Number of Children: 2  . Years of Education: N/A   Occupational History  . applied for disability, FT student    Social History Main Topics  . Smoking status: Never Smoker   . Smokeless tobacco: Never Used  . Alcohol Use: No  . Drug Use: No  . Sexual Activity: Not Currently    Birth Control/ Protection: None   Other Topics Concern  . None   Social History Narrative   Lives in [place] by herself        Past Surgical History  Procedure Laterality Date  . Shoulder arthroscopy w/ rotator cuff repair Right 2006, 2007  . Umbilical hernia repair  2001, 2003  . Wisdom tooth extraction  1985  . Anterior cervical decomp/discectomy fusion  08/27/2013  . Cholecystectomy  06/1989  . Hernia repair    . Knee arthroscopy Left 08/2003  . Tubal ligation  1991  . Anterior cervical decomp/discectomy fusion N/A 08/27/2013  Procedure: ANTERIOR CERVICAL /DISCECTOMY FUSION (ACDF) C5-C7  2 LEVELS;  Surgeon: Melina Schools, MD;  Location: [facility];  Service: Orthopedics;  Laterality: N/A;  . Left heart catheterization with coronary angiogram N/A 04/15/2013    Procedure: LEFT HEART CATHETERIZATION WITH CORONARY ANGIOGRAM;  Surgeon: Peter M Martinique, MD;  Location: [facility] CATH LAB;  Service: Cardiovascular;  Laterality: N/A;  . Breast reduction surgery Bilateral 09/28/2014    Procedure: MAMMARY REDUCTION  (BREAST) BILATERAL;  Surgeon: Cristine Polio, MD;  Location: [facility];  Service: Plastics;  Laterality: Bilateral;  . Liposuction Bilateral 09/28/2014    Procedure: LIPOSUCTION;  Surgeon: Cristine Polio, MD;  Location: [facility];  Service: Plastics;  Laterality: Bilateral;  . Breast surgery    . Dilation and curettage of uterus    . Robotic assisted total hysterectomy N/A 01/22/2015    Procedure: ROBOTIC ASSISTED TOTAL  HYSTERECTOMY With Pelvic Washings, Lysis of Adhesions;  Surgeon: Azucena Fallen, MD;  Location: [facility];  Service: Gynecology;  Laterality: N/A;  . Laparoscopic bilateral salpingectomy Bilateral 01/22/2015    Procedure: LAPAROSCOPIC BILATERAL SALPINGECTOMY;  Surgeon: Azucena Fallen, MD;  Location: [facility];  Service: Gynecology;  Laterality: Bilateral;   Past Medical History  Diagnosis Date  . Obesity   . GERD (gastroesophageal reflux disease)   . Anxiety state, unspecified 05/16/2013  . Sickle cell trait ([place])   . Asthma     "only when I get a sinus infection which is 1-2 X/yr" (08/27/2013)  . Diabetes ([place])     "borderline" (08/27/2013)  . Migraine     "once or twice/month now" (08/27/2013)  . Arthritis     "right knee" (08/27/2013)  . Hypertension     no meds  . Wears glasses    BP 146/94 mmHg  Pulse 90  SpO2 97%  LMP 01/16/2015  Opioid Risk Score:   Fall Risk Score:  `1  Depression screen PHQ 2/9  Depression screen [facility] 2/9 08/09/2015 04/16/2015 01/18/2015 02/14/2013  Decreased Interest 0 0 0 0  Down, Depressed, Hopeless 0 0 0 0  PHQ - 2 Score 0 0 0 0  Altered sleeping - - 2 -  Tired, decreased energy - - 2 -  Change in appetite - - 2 -  Feeling bad or failure about yourself  - - 0 -  Trouble concentrating - - 0 -  Moving slowly or fidgety/restless - - 0 -  Suicidal thoughts - - 0 -  PHQ-9 Score - - 6 -      Review of Systems  All other systems reviewed and are negative.      Objective:   Physical Exam  Constitutional: She is oriented to person, place, and time. She appears well-developed.  Morbid obesity  HENT:  Head: Normocephalic and atraumatic.  Eyes: Conjunctivae and EOM are normal. Pupils are equal, round, and reactive to light.  Neck: Normal range of motion.  Musculoskeletal:       Right knee: She exhibits deformity.       Left knee: She exhibits deformity.  Neurological: She is alert and oriented to person, place, and time.  Psychiatric: She has a normal  mood and affect.  Nursing note and vitals reviewed. Lumbar spine has tenderness at the lumbosacral junction. No significant pain in the upper lumbar area. She has normal strength in bilateral hip flexors knee extensors ankle dorsiflexors Bilateral valgus deformity at the knees. Ambulates with a cane no evidence of toe drag or knee instability  Assessment & Plan:     1.  Chronic low back pain, Poorly responsive to  left L3 selective nerve root block. She had an MRI in October 2015 demonstrating some disc bulging eccentric to the left at L3-4. Patient had no significant stenosis at that level. There was some arthritic changes at L3-4.  She underwent L2-3 L3-4 L4-5 medial branch blocks with good relief on one occasion but poor relief the other occasion. Therefore I do not recommend radiofrequency neurotomy  She has had pain mainly in the lumbosacral area. Exam shows pain mainly at and slightly below the belt line. This could be sacroiliac pain and further diagnostic evaluation would be indicated.  Do not see any significant radiculopathy.  We used a spine model to discuss where her problems are in her lumbar spine as well as what type of injections we have done thus far and the type that we are recommending for next visit. Over half of the 25 min visit was spent counseling and coordinating care.  Opioid risk score 0, PH Q9 score is 6, We'll start Tylenol with codeine 1 tablet 3 times per day Given history that tramadol is not helpful in her pain relief.  Also filled out a parking placard form, she's been ambulating with a cane for at least a year. Therefore she qualifies for a permanent placard  2. 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 Rolena Infante. This may be causing her upper extreme symptoms and potentially even left lower extremity buckling.  3. Valgus deformity bilateral knees, currently without severe knee pain, her  left knee buckling could be related to arthritis versus her cervical stenosis

## 2015-09-29 ENCOUNTER — Ambulatory Visit: Payer: BLUE CROSS/BLUE SHIELD | Admitting: Internal Medicine

## 2015-09-30 ENCOUNTER — Telehealth: Payer: Self-pay | Admitting: Internal Medicine

## 2015-09-30 NOTE — Telephone Encounter (Signed)
No Charge 

## 2015-09-30 NOTE — Telephone Encounter (Signed)
Pt was marked no show bc she came in approx 1:30pm 09/29/15 for scheduled 1:00pm appt, pt was rescheduled to 10/12/15, charge or no charge?

## 2015-10-08 ENCOUNTER — Ambulatory Visit: Payer: BLUE CROSS/BLUE SHIELD | Admitting: Physical Medicine & Rehabilitation

## 2015-10-12 ENCOUNTER — Ambulatory Visit: Payer: BLUE CROSS/BLUE SHIELD | Admitting: Internal Medicine

## 2015-10-12 ENCOUNTER — Telehealth: Payer: Self-pay | Admitting: Internal Medicine

## 2015-10-12 DIAGNOSIS — Z0289 Encounter for other administrative examinations: Secondary | ICD-10-CM

## 2015-10-12 NOTE — Telephone Encounter (Signed)
No Charge 

## 2015-10-12 NOTE — Telephone Encounter (Signed)
Pt's car will not start and has not been able to find someone to give her a ride for her 11:00am appt. Charge or no charge?

## 2015-10-18 ENCOUNTER — Encounter: Payer: Self-pay | Admitting: Internal Medicine

## 2015-10-18 ENCOUNTER — Ambulatory Visit (INDEPENDENT_AMBULATORY_CARE_PROVIDER_SITE_OTHER): Payer: BLUE CROSS/BLUE SHIELD | Admitting: Internal Medicine

## 2015-10-18 VITALS — BP 128/78 | HR 74 | Temp 97.9°F | Ht 65.0 in | Wt 289.5 lb

## 2015-10-18 DIAGNOSIS — Z09 Encounter for follow-up examination after completed treatment for conditions other than malignant neoplasm: Secondary | ICD-10-CM

## 2015-10-18 DIAGNOSIS — E785 Hyperlipidemia, unspecified: Secondary | ICD-10-CM | POA: Diagnosis not present

## 2015-10-18 DIAGNOSIS — E119 Type 2 diabetes mellitus without complications: Secondary | ICD-10-CM

## 2015-10-18 DIAGNOSIS — Z114 Encounter for screening for human immunodeficiency virus [HIV]: Secondary | ICD-10-CM

## 2015-10-18 LAB — HIV ANTIBODY (ROUTINE TESTING W REFLEX): HIV 1&2 Ab, 4th Generation: NONREACTIVE

## 2015-10-18 NOTE — Assessment & Plan Note (Signed)
DM: Rec to see the eye doctor, continue metformin, Invokana. Check a micro , BMP and A1c Dyslipidemia: Last FLP much improved. Seems to be allergic to codeine, recommend to discontinue, allergies list updated. MSK: On NSAIDs as needed, follow-up by other MDs 1 care- start aspirin, check HIV RTC 4 months

## 2015-10-18 NOTE — Patient Instructions (Signed)
GO TO THE LAB :      Get the blood work     GO TO THE FRONT DESK Schedule your next appointment for a  routine checkup in 4 months, fasting   -----  No more codeine  Take Benadryl for a few days until completely well. If the swelling returns let us know  Start taking aspirin 81 mg: One tablet daily

## 2015-10-18 NOTE — Progress Notes (Signed)
Pre visit review using our clinic review tool, if applicable. No additional management support is needed unless otherwise documented below in the visit note. 

## 2015-10-18 NOTE — Progress Notes (Signed)
Subjective:    Patient ID: [name], female    DOB: 07/17/1965, 50 y.o.   MRN: [id number]  DOS:  10/18/2015 Type of visit - description :  Routine checkup Interval history: Was prescribed Tylenol No. 3, developed a fine rash on the face and lip swelling after 2 trials. Last Tylenol No. 3 dose a couple of days ago, took Benadryl, swelling is getting better. Denies tongue or throat swelling Diabetes: Working on trying to be more active, good compliance with medication, blood sugars around 120, 139 when checked MSK: Continue with issues with neck pain and back pain. Follow-up elsewhere.  Review of Systems No chest pain or difficulty breathing. No nausea or vomiting.   Past Medical History  Diagnosis Date  . Obesity   . GERD (gastroesophageal reflux disease)   . Anxiety state, unspecified 05/16/2013  . Sickle cell trait ([place])   . Asthma     "only when I get a sinus infection which is 1-2 X/yr" (08/27/2013)  . Diabetes ([place])     "borderline" (08/27/2013)  . Migraine     "once or twice/month now" (08/27/2013)  . Arthritis     "right knee" (08/27/2013)  . Hypertension     no meds  . Wears glasses   . Neck pain     axial, s/p cervical fusion C5-6, C6-7     Past Surgical History  Procedure Laterality Date  . Shoulder arthroscopy w/ rotator cuff repair Right 2006, 2007  . Umbilical hernia repair  2001, 2003  . Wisdom tooth extraction  1985  . Anterior cervical decomp/discectomy fusion  08/27/2013  . Cholecystectomy  06/1989  . Hernia repair    . Knee arthroscopy Left 08/2003  . Tubal ligation  1991  . Anterior cervical decomp/discectomy fusion N/A 08/27/2013    Procedure: ANTERIOR CERVICAL /DISCECTOMY FUSION (ACDF) C5-C7  2 LEVELS;  Surgeon: Melina Schools, MD;  Location: [place];  Service: Orthopedics;  Laterality: N/A;  . Left heart catheterization with coronary angiogram N/A 04/15/2013    Procedure: LEFT HEART CATHETERIZATION WITH CORONARY ANGIOGRAM;  Surgeon: Peter M Martinique,  MD;  Location: [facility] CATH LAB;  Service: Cardiovascular;  Laterality: N/A;  . Breast reduction surgery Bilateral 09/28/2014    Procedure: MAMMARY REDUCTION  (BREAST) BILATERAL;  Surgeon: Cristine Polio, MD;  Location: [place];  Service: Plastics;  Laterality: Bilateral;  . Liposuction Bilateral 09/28/2014    Procedure: LIPOSUCTION;  Surgeon: Cristine Polio, MD;  Location: [place];  Service: Plastics;  Laterality: Bilateral;  . Breast surgery    . Dilation and curettage of uterus    . Robotic assisted total hysterectomy N/A 01/22/2015    Procedure: ROBOTIC ASSISTED TOTAL HYSTERECTOMY With Pelvic Washings, Lysis of Adhesions;  Surgeon: Azucena Fallen, MD;  Location: [facility];  Service: Gynecology;  Laterality: N/A;  . Laparoscopic bilateral salpingectomy Bilateral 01/22/2015    Procedure: LAPAROSCOPIC BILATERAL SALPINGECTOMY;  Surgeon: Azucena Fallen, MD;  Location: [facility];  Service: Gynecology;  Laterality: Bilateral;    Social History   Social History  . Marital Status: Divorced    Spouse Name: N/A  . Number of Children: 2  . Years of Education: N/A   Occupational History  . applied for disability, FT student    Social History Main Topics  . Smoking status: Never Smoker   . Smokeless tobacco: Never Used  . Alcohol Use: No  . Drug Use: No  . Sexual Activity: Not Currently    Birth Control/ Protection:  None   Other Topics Concern  . Not on file   Social History Narrative   Lives in [place] by herself             Medication List       This list is accurate as of: 10/18/15  4:45 PM.  Always use your most recent med list.               baclofen 10 MG tablet  Commonly known as:  LIORESAL  Take 1 tablet as needed at onset of migraine.     budesonide-formoterol 160-4.5 MCG/ACT inhaler  Commonly known as:  SYMBICORT  Inhale 2 puffs into the lungs 2 (two) times daily.     canagliflozin 300 MG Tabs tablet  Commonly known as:  INVOKANA  Take  300 mg by mouth daily before breakfast.     fexofenadine 180 MG tablet  Commonly known as:  ALLEGRA  Take 180 mg by mouth daily.     folic acid 1 MG tablet  Commonly known as:  FOLVITE  Take 1 mg by mouth daily.     gabapentin 300 MG capsule  Commonly known as:  NEURONTIN  Take 1 capsule at night     ibuprofen 200 MG tablet  Commonly known as:  ADVIL  Take 3 tablets (600 mg total) by mouth every 6 (six) hours as needed.     metFORMIN 1000 MG tablet  Commonly known as:  GLUCOPHAGE  Take 1 tablet (1,000 mg total) by mouth 2 (two) times daily with a meal.     naproxen 375 MG tablet  Commonly known as:  NAPROSYN  Take 1 tablet (375 mg total) by mouth 2 (two) times daily.     topiramate 50 MG tablet  Commonly known as:  TOPAMAX  Take 1 tablet in AM, 2 tablets in PM     vitamin B-12 500 MCG tablet  Commonly known as:  CYANOCOBALAMIN  Take 1,000 mcg by mouth daily.     vitamin C 100 MG tablet  Take 100 mg by mouth 2 (two) times daily.           Objective:   Physical Exam BP 128/78 mmHg  Pulse 74  Temp(Src) 97.9 F (36.6 C) (Oral)  Ht 5\' 5"  (1.651 m)  Wt 289 lb 8 oz (131.316 kg)  BMI 48.18 kg/m2  SpO2 99%  LMP 01/16/2015 General:   Well developed, well nourished . NAD.  HEENT:  Normocephalic . Face symmetric, atraumatic. Legs: Minimal knee swelling, face currently with no rash Lungs:  CTA B Normal respiratory effort, no intercostal retractions, no accessory muscle use. Heart: RRR,  no murmur.  Trace pretibial edema bilaterally  Skin: Not pale. Not jaundice Neurologic:  alert & oriented X3.  Speech normal, gait appropriate for age and unassisted Psych--  Cognition and judgment appear intact.  Cooperative with normal attention span and concentration.  Behavior appropriate. No anxious or depressed appearing.      Assessment & Plan:  Assessment > DM, no neuropathy Dyslipidemia Elevated BP GERD Asthma-- associated with URIs Anxiety MSK: --DJD.  Multiple surgeries.  ---Chronic back pain, local injections back Dr. Ella Bodo --on tramadol, robaxin, Valium pre-procedures ---Neck pain-- Dr Rolena Infante, Dr Nelva Bush Migraine headaches -- on topamax, baclofen, per Dr Delice Lesch Sickle cell trait CP: cardiac cath 2014 -- normal  Plan: DM: Rec to see the eye doctor, continue metformin, Invokana. Check a micro , BMP and A1c Dyslipidemia: Last FLP much improved. Seems to be allergic to  codeine, recommend to discontinue, allergies list updated. MSK: On NSAIDs as needed, follow-up by other MDs 1 care- start aspirin, check HIV RTC 4 months

## 2015-10-19 LAB — BASIC METABOLIC PANEL
BUN: 7 mg/dL (ref 6–23)
CO2: 29 mEq/L (ref 19–32)
Calcium: 9.6 mg/dL (ref 8.4–10.5)
Chloride: 105 mEq/L (ref 96–112)
Creatinine, Ser: 0.72 mg/dL (ref 0.40–1.20)
GFR: 110.66 mL/min (ref >60.00)
Glucose, Bld: 96 mg/dL (ref 70–99)
Potassium: 4.4 mEq/L (ref 3.5–5.1)
Sodium: 140 mEq/L (ref 135–145)

## 2015-10-19 LAB — MICROALBUMIN / CREATININE URINE RATIO
Creatinine,U: 117.6 mg/dL
Microalb Creat Ratio: 5.4 mg/g (ref 0.0–30.0)
Microalb, Ur: 6.4 mg/dL — ABNORMAL HIGH (ref 0.0–1.9)

## 2015-10-19 LAB — HEMOGLOBIN A1C: Hgb A1c MFr Bld: 6.7 % — ABNORMAL HIGH (ref 4.6–6.5)

## 2015-11-01 ENCOUNTER — Ambulatory Visit: Payer: BLUE CROSS/BLUE SHIELD | Admitting: Physical Medicine & Rehabilitation

## 2015-11-01 ENCOUNTER — Telehealth: Payer: Self-pay | Admitting: Physical Medicine & Rehabilitation

## 2015-11-01 ENCOUNTER — Encounter: Payer: BLUE CROSS/BLUE SHIELD | Attending: Physical Medicine & Rehabilitation

## 2015-11-01 DIAGNOSIS — E669 Obesity, unspecified: Secondary | ICD-10-CM | POA: Insufficient documentation

## 2015-11-01 DIAGNOSIS — Z8249 Family history of ischemic heart disease and other diseases of the circulatory system: Secondary | ICD-10-CM | POA: Insufficient documentation

## 2015-11-01 DIAGNOSIS — M5127 Other intervertebral disc displacement, lumbosacral region: Secondary | ICD-10-CM | POA: Insufficient documentation

## 2015-11-01 DIAGNOSIS — Z8489 Family history of other specified conditions: Secondary | ICD-10-CM | POA: Insufficient documentation

## 2015-11-01 DIAGNOSIS — G894 Chronic pain syndrome: Secondary | ICD-10-CM | POA: Insufficient documentation

## 2015-11-01 DIAGNOSIS — K219 Gastro-esophageal reflux disease without esophagitis: Secondary | ICD-10-CM | POA: Insufficient documentation

## 2015-11-01 DIAGNOSIS — M25569 Pain in unspecified knee: Secondary | ICD-10-CM | POA: Insufficient documentation

## 2015-11-01 DIAGNOSIS — Z803 Family history of malignant neoplasm of breast: Secondary | ICD-10-CM | POA: Insufficient documentation

## 2015-11-01 DIAGNOSIS — D571 Sickle-cell disease without crisis: Secondary | ICD-10-CM | POA: Insufficient documentation

## 2015-11-01 DIAGNOSIS — M961 Postlaminectomy syndrome, not elsewhere classified: Secondary | ICD-10-CM | POA: Insufficient documentation

## 2015-11-01 DIAGNOSIS — I10 Essential (primary) hypertension: Secondary | ICD-10-CM | POA: Insufficient documentation

## 2015-11-01 DIAGNOSIS — Z800 Family history of malignant neoplasm of digestive organs: Secondary | ICD-10-CM | POA: Insufficient documentation

## 2015-11-01 DIAGNOSIS — E119 Type 2 diabetes mellitus without complications: Secondary | ICD-10-CM | POA: Insufficient documentation

## 2015-11-01 NOTE — Telephone Encounter (Signed)
Will contrast affect pt injection today??

## 2015-11-02 NOTE — Telephone Encounter (Signed)
Called pt back sand informed that contrast would have no effect on todays procedure.  Pt acknowledged.

## 2015-11-05 ENCOUNTER — Telehealth: Payer: Self-pay

## 2015-11-05 NOTE — Telephone Encounter (Signed)
Pt called stating that she followed up with Dr. Rolena Infante. Dr. Rolena Infante advised the pt to receive a cervical injection at the C-4 level. She was calling to see what [facility] could do. Please advise for any possible injection we can provide for her.Marland KitchenMarland Kitchen

## 2015-11-05 NOTE — Telephone Encounter (Signed)
I could do a lumbar epidural or injection however she should go to Dr. Nelva Bush for a C4 epidural

## 2015-11-09 NOTE — Telephone Encounter (Signed)
Katie notified. I explained to her that we do not do the cervical epidural injections here.

## 2015-11-15 ENCOUNTER — Encounter: Payer: Self-pay | Admitting: Physical Medicine & Rehabilitation

## 2015-11-15 ENCOUNTER — Encounter: Payer: BLUE CROSS/BLUE SHIELD | Attending: Physical Medicine & Rehabilitation

## 2015-11-15 ENCOUNTER — Ambulatory Visit (HOSPITAL_BASED_OUTPATIENT_CLINIC_OR_DEPARTMENT_OTHER): Payer: BLUE CROSS/BLUE SHIELD | Admitting: Physical Medicine & Rehabilitation

## 2015-11-15 VITALS — BP 147/83 | HR 79 | Resp 14

## 2015-11-15 DIAGNOSIS — Z8249 Family history of ischemic heart disease and other diseases of the circulatory system: Secondary | ICD-10-CM | POA: Insufficient documentation

## 2015-11-15 DIAGNOSIS — K219 Gastro-esophageal reflux disease without esophagitis: Secondary | ICD-10-CM | POA: Insufficient documentation

## 2015-11-15 DIAGNOSIS — M961 Postlaminectomy syndrome, not elsewhere classified: Secondary | ICD-10-CM | POA: Insufficient documentation

## 2015-11-15 DIAGNOSIS — M5127 Other intervertebral disc displacement, lumbosacral region: Secondary | ICD-10-CM | POA: Diagnosis not present

## 2015-11-15 DIAGNOSIS — M549 Dorsalgia, unspecified: Secondary | ICD-10-CM | POA: Diagnosis present

## 2015-11-15 DIAGNOSIS — E669 Obesity, unspecified: Secondary | ICD-10-CM | POA: Insufficient documentation

## 2015-11-15 DIAGNOSIS — I10 Essential (primary) hypertension: Secondary | ICD-10-CM | POA: Diagnosis not present

## 2015-11-15 DIAGNOSIS — G894 Chronic pain syndrome: Secondary | ICD-10-CM | POA: Insufficient documentation

## 2015-11-15 DIAGNOSIS — M25569 Pain in unspecified knee: Secondary | ICD-10-CM | POA: Diagnosis not present

## 2015-11-15 DIAGNOSIS — M533 Sacrococcygeal disorders, not elsewhere classified: Secondary | ICD-10-CM

## 2015-11-15 DIAGNOSIS — E119 Type 2 diabetes mellitus without complications: Secondary | ICD-10-CM | POA: Insufficient documentation

## 2015-11-15 DIAGNOSIS — Z803 Family history of malignant neoplasm of breast: Secondary | ICD-10-CM | POA: Insufficient documentation

## 2015-11-15 DIAGNOSIS — Z8489 Family history of other specified conditions: Secondary | ICD-10-CM | POA: Diagnosis not present

## 2015-11-15 DIAGNOSIS — Z800 Family history of malignant neoplasm of digestive organs: Secondary | ICD-10-CM | POA: Insufficient documentation

## 2015-11-15 DIAGNOSIS — D571 Sickle-cell disease without crisis: Secondary | ICD-10-CM | POA: Diagnosis not present

## 2015-11-15 NOTE — Patient Instructions (Signed)
Sacroiliac injection was performed today. A combination of a naming medicine plus a cortisone medicine was injected. The injection was done under x-ray guidance. This procedure has been performed to help reduce low back and buttocks pain as well as potentially hip pain. The duration of this injection is variable lasting from hours to  Months. It may repeated if needed. 

## 2015-11-15 NOTE — Progress Notes (Signed)

## 2015-11-15 NOTE — Progress Notes (Signed)
  [facility]   Name: INES TOENNIES DOB:1966-03-20 MRN: [id number]  Date:11/15/2015  Physician: Alysia Penna, MD    Nurse/CMA: Mancel Parsons, CMA  Allergies:  Allergies  Allergen Reactions  . Codeine     Facial rash , lip swelling   . Vicodin [Hydrocodone-Acetaminophen] Swelling and Rash    Pt is able to take percocet without problems    Consent Signed: Yes.    Is patient diabetic? Yes.    CBG today? 59  Pregnant: No. LMP: Patient's last menstrual period was 01/16/2015. (age 50-55)  Anticoagulants: no Anti-inflammatory: no Antibiotics: no  Procedure: bilateral sacroiliac steroid injection Position: Prone Start Time: 2:42pm  End Time: 2:50pm  Fluoro Time: 21  RN/CMA Chimamanda Siegfried,CMA Enzio Buchler,CMA    Time 2:28pm 2:54pm    BP 147/83 176/1043    Pulse 79 76    Respirations 14 14    O2 Sat 99 99    S/S 6 6    Pain Level 7/10 7/10     D/C home with daughter, patient A & O X 3, D/C instructions reviewed, and sits independently.

## 2015-11-17 LAB — HM PAP SMEAR

## 2015-11-17 LAB — HM MAMMOGRAPHY

## 2015-12-28 ENCOUNTER — Other Ambulatory Visit: Payer: Self-pay | Admitting: Internal Medicine

## 2016-01-01 ENCOUNTER — Telehealth: Payer: BLUE CROSS/BLUE SHIELD | Admitting: Family

## 2016-01-01 DIAGNOSIS — R319 Hematuria, unspecified: Secondary | ICD-10-CM

## 2016-01-01 DIAGNOSIS — N390 Urinary tract infection, site not specified: Secondary | ICD-10-CM

## 2016-01-01 MED ORDER — SULFAMETHOXAZOLE-TRIMETHOPRIM 800-160 MG PO TABS: 1.0000 | ORAL_TABLET | Freq: Two times a day (BID) | ORAL | Status: DC

## 2016-01-01 NOTE — Progress Notes (Signed)

## 2016-01-04 ENCOUNTER — Encounter (HOSPITAL_COMMUNITY): Payer: Self-pay | Admitting: Emergency Medicine

## 2016-01-04 ENCOUNTER — Emergency Department (HOSPITAL_COMMUNITY): Payer: BLUE CROSS/BLUE SHIELD

## 2016-01-04 ENCOUNTER — Other Ambulatory Visit: Payer: Self-pay

## 2016-01-04 ENCOUNTER — Emergency Department (HOSPITAL_COMMUNITY)
Admission: EM | Admit: 2016-01-04 | Discharge: 2016-01-04 | Disposition: A | Discharge status: Home / Self Care | Payer: BLUE CROSS/BLUE SHIELD | Attending: Emergency Medicine | Admitting: Emergency Medicine

## 2016-01-04 DIAGNOSIS — J45909 Unspecified asthma, uncomplicated: Secondary | ICD-10-CM | POA: Diagnosis not present

## 2016-01-04 DIAGNOSIS — Z7984 Long term (current) use of oral hypoglycemic drugs: Secondary | ICD-10-CM | POA: Insufficient documentation

## 2016-01-04 DIAGNOSIS — I10 Essential (primary) hypertension: Secondary | ICD-10-CM | POA: Insufficient documentation

## 2016-01-04 DIAGNOSIS — E1165 Type 2 diabetes mellitus with hyperglycemia: Secondary | ICD-10-CM | POA: Diagnosis not present

## 2016-01-04 DIAGNOSIS — R0789 Other chest pain: Secondary | ICD-10-CM

## 2016-01-04 DIAGNOSIS — Z79899 Other long term (current) drug therapy: Secondary | ICD-10-CM | POA: Diagnosis not present

## 2016-01-04 LAB — BASIC METABOLIC PANEL
Anion gap: 9 (ref 5–15)
BUN: 7 mg/dL (ref 6–20)
CO2: 21 mmol/L — ABNORMAL LOW (ref 22–32)
Calcium: 9 mg/dL (ref 8.9–10.3)
Chloride: 107 mmol/L (ref 101–111)
Creatinine, Ser: 0.86 mg/dL (ref 0.44–1.00)
GFR calc Af Amer: >60 mL/min (ref >60)
GFR calc non Af Amer: >60 mL/min (ref >60)
Glucose, Bld: 164 mg/dL — ABNORMAL HIGH (ref 65–99)
Potassium: 3.9 mmol/L (ref 3.5–5.1)
Sodium: 137 mmol/L (ref 135–145)

## 2016-01-04 LAB — CBC
HCT: 38.3 % (ref 36.0–46.0)
Hemoglobin: 13 g/dL (ref 12.0–15.0)
MCH: 27.7 pg (ref 26.0–34.0)
MCHC: 33.9 g/dL (ref 30.0–36.0)
MCV: 81.7 fL (ref 78.0–100.0)
Platelets: 283 10*3/uL (ref 150–400)
RBC: 4.69 MIL/uL (ref 3.87–5.11)
RDW: 14.5 % (ref 11.5–15.5)
WBC: 7.9 10*3/uL (ref 4.0–10.5)

## 2016-01-04 LAB — I-STAT TROPONIN, ED: Troponin i, poc: 0 ng/mL (ref 0.00–0.08)

## 2016-01-04 NOTE — ED Notes (Signed)
Pt. reports central chest tightness onset this morning , denies SOB , no nausea or diaphoresis .

## 2016-01-04 NOTE — ED Provider Notes (Addendum)
CSN: [id number]     Arrival date & time 01/04/16  0606 History   First MD Initiated Contact with Patient 01/04/16 [phone]     Chief Complaint  Patient presents with  . Chest Pain     (Consider location/radiation/quality/duration/timing/severity/associated sxs/prior Treatment) HPI Complains of anterior chest pain described as tightness onset 7 PM yesterday gradually 1 hour after eating chicken wings with hot sauce. Pain is worse with lying supine improved with sitting upright treated self with Nexium without relief. She's had similar episodes in the past which she describes as indigestion. No nausea no vomiting no shortness of breath no sweatiness. No other associated symptoms. Treated herself with Nexium, without relief pain is now a 3 on a scale of 1-10 at its worst it was a 6 on a scale of 1-10. Pain has been constant since onset. Not affected by walking. No other associated symptoms Past Medical History  Diagnosis Date  . Obesity   . GERD (gastroesophageal reflux disease)   . Anxiety state, unspecified 05/16/2013  . Sickle cell trait ([place])   . Asthma     "only when I get a sinus infection which is 1-2 X/yr" (08/27/2013)  . Diabetes ([place])     "borderline" (08/27/2013)  . Migraine     "once or twice/month now" (08/27/2013)  . Arthritis     "right knee" (08/27/2013)  . Hypertension     no meds  . Wears glasses   . Neck pain     axial, s/p cervical fusion C5-6, C6-7   Cardiac catheterization 2014, normal Past Surgical History  Procedure Laterality Date  . Shoulder arthroscopy w/ rotator cuff repair Right 2006, 2007  . Umbilical hernia repair  2001, 2003  . Wisdom tooth extraction  1985  . Anterior cervical decomp/discectomy fusion  08/27/2013  . Cholecystectomy  06/1989  . Hernia repair    . Knee arthroscopy Left 08/2003  . Tubal ligation  1991  . Anterior cervical decomp/discectomy fusion N/A 08/27/2013    Procedure: ANTERIOR CERVICAL /DISCECTOMY FUSION (ACDF) C5-C7  2 LEVELS;   Surgeon: Melina Schools, MD;  Location: [place];  Service: Orthopedics;  Laterality: N/A;  . Left heart catheterization with coronary angiogram N/A 04/15/2013    Procedure: LEFT HEART CATHETERIZATION WITH CORONARY ANGIOGRAM;  Surgeon: Peter M Martinique, MD;  Location: [facility] CATH LAB;  Service: Cardiovascular;  Laterality: N/A;  . Breast reduction surgery Bilateral 09/28/2014    Procedure: MAMMARY REDUCTION  (BREAST) BILATERAL;  Surgeon: Cristine Polio, MD;  Location: [place];  Service: Plastics;  Laterality: Bilateral;  . Liposuction Bilateral 09/28/2014    Procedure: LIPOSUCTION;  Surgeon: Cristine Polio, MD;  Location: [place];  Service: Plastics;  Laterality: Bilateral;  . Breast surgery    . Dilation and curettage of uterus    . Robotic assisted total hysterectomy N/A 01/22/2015    Procedure: ROBOTIC ASSISTED TOTAL HYSTERECTOMY With Pelvic Washings, Lysis of Adhesions;  Surgeon: Azucena Fallen, MD;  Location: [facility];  Service: Gynecology;  Laterality: N/A;  . Laparoscopic bilateral salpingectomy Bilateral 01/22/2015    Procedure: LAPAROSCOPIC BILATERAL SALPINGECTOMY;  Surgeon: Azucena Fallen, MD;  Location: [facility];  Service: Gynecology;  Laterality: Bilateral;   Family History  Problem Relation Age of Onset  . Colon cancer Neg Hx   . Breast cancer Other      GM  . Diabetes Father     F, B, S  . Pancreatic cancer Other     GM  . CAD Sister  MI at age 73  . CAD Brother     CHF, died at age 51  . Heart attack Brother     died suddenly @ 33  . CAD Father     died of MI @ 28  . Kidney disease Father    Social History  Substance Use Topics  . Smoking status: Never Smoker   . Smokeless tobacco: Never Used  . Alcohol Use: No   OB History    No data available     Review of Systems  Cardiovascular: Positive for chest pain.  Allergic/Immunologic: Positive for immunocompromised state.       Diabetic  Neurological: Positive for numbness.        Numbness radiating from left neck to left hand for the past 6 months, constant.  All other systems reviewed and are negative.     Allergies  Codeine and Vicodin  Home Medications   Prior to Admission medications   Medication Sig Start Date End Date Taking? Authorizing Provider  Ascorbic Acid (VITAMIN C) 100 MG tablet Take 100 mg by mouth 2 (two) times daily.     Historical Provider, MD  baclofen (LIORESAL) 10 MG tablet Take 1 tablet as needed at onset of migraine. 08/23/15   Cameron Sprang, MD  budesonide-formoterol [facility]) 160-4.5 MCG/ACT inhaler Inhale 2 puffs into the lungs 2 (two) times daily. 04/16/15   Colon Branch, MD  canagliflozin [facility]) 300 MG TABS tablet Take 300 mg by mouth daily before breakfast. 11/18/14   Colon Branch, MD  fexofenadine (ALLEGRA) 180 MG tablet Take 180 mg by mouth daily.    Historical Provider, MD  folic acid (FOLVITE) 1 MG tablet Take 1 mg by mouth daily.    Historical Provider, MD  gabapentin (NEURONTIN) 300 MG capsule Take 1 capsule at night 08/23/15   Cameron Sprang, MD  ibuprofen (ADVIL) 200 MG tablet Take 3 tablets (600 mg total) by mouth every 6 (six) hours as needed. 01/23/15   Azucena Fallen, MD  metFORMIN (GLUCOPHAGE) 1000 MG tablet Take 1 tablet (1,000 mg total) by mouth 2 (two) times daily with a meal. 12/29/15   Colon Branch, MD  naproxen (NAPROSYN) 375 MG tablet Take 1 tablet (375 mg total) by mouth 2 (two) times daily. Patient taking differently: Take 375 mg by mouth 2 (two) times daily as needed.  05/24/15   Janne Napoleon, NP  sulfamethoxazole-trimethoprim (BACTRIM DS,SEPTRA DS) 800-160 MG tablet Take 1 tablet by mouth 2 (two) times daily. 01/01/16   Benjamine Mola, FNP  topiramate (TOPAMAX) 50 MG tablet Take 1 tablet in AM, 2 tablets in PM 08/23/15   Cameron Sprang, MD  vitamin B-12 (CYANOCOBALAMIN) 500 MCG tablet Take 1,000 mcg by mouth daily.     Historical Provider, MD   BP 163/111 mmHg  Pulse 76  Temp(Src) 98.3 F (36.8 C) (Oral)  Resp 18   SpO2 100%  LMP 01/16/2015 Physical Exam  Constitutional: She appears well-developed and well-nourished. No distress.  HENT:  Head: Normocephalic and atraumatic.  Eyes: Conjunctivae are normal. Pupils are equal, round, and reactive to light.  Neck: Neck supple. No tracheal deviation present. No thyromegaly present.  Cardiovascular: Normal rate and regular rhythm.   No murmur heard. Pulmonary/Chest: Effort normal and breath sounds normal.  Abdominal: Soft. Bowel sounds are normal. She exhibits no distension. There is no tenderness.  Obese  Musculoskeletal: Normal range of motion. She exhibits no edema or tenderness.  Neurological: She is alert. Coordination  normal.  Skin: Skin is warm and dry. No rash noted.  Psychiatric: She has a normal mood and affect.  Nursing note and vitals reviewed.   ED Course  Procedures (including critical care time) Labs Review Labs Reviewed  CBC  BASIC METABOLIC PANEL  I-STAT [place], ED    Imaging Review Dg Chest 2 View  01/04/2016  CLINICAL DATA:  Chest tightness and right-sided paresthesias, onset tonight EXAM: CHEST  2 VIEW COMPARISON:  04/22/2013 FINDINGS: Unchanged borderline heart size. The lungs are clear. The pulmonary vasculature is normal. There is no pleural effusion. Hilar and mediastinal contours are unremarkable and unchanged. IMPRESSION: No active cardiopulmonary disease. Electronically Signed   By: Andreas Newport M.D.   On: 01/04/2016 06:38   I have personally reviewed and evaluated these images and lab results as part of my medical decision-making.   EKG Interpretation   Date/Time:  Tuesday January 04 2016 06:12:39 EDT Ventricular Rate:  73 PR Interval:  178 QRS Duration: 72 QT Interval:  416 QTC Calculation: 458 R Axis:   21 Text Interpretation:  Normal sinus rhythm Normal ECG No significant change  since last tracing Confirmed by Winfred Leeds  MD, Yoshiharu Brassell [phone]) on 01/04/2016  7:01:24 AM     Chest x-ray viewed by me Results  for orders placed or performed during the hospital encounter of [id number]  Basic metabolic panel  Result Value Ref Range   Sodium 137 135 - 145 mmol/L   Potassium 3.9 3.5 - 5.1 mmol/L   Chloride 107 101 - 111 mmol/L   CO2 21 (L) 22 - 32 mmol/L   Glucose, Bld 164 (H) 65 - 99 mg/dL   BUN 7 6 - 20 mg/dL   Creatinine, Ser 0.86 0.44 - 1.00 mg/dL   Calcium 9.0 8.9 - 10.3 mg/dL   GFR calc non Af Amer >60 >60 mL/min   GFR calc Af Amer >60 >60 mL/min   Anion gap 9 5 - 15  CBC  Result Value Ref Range   WBC 7.9 4.0 - 10.5 K/uL   RBC 4.69 3.87 - 5.11 MIL/uL   Hemoglobin 13.0 12.0 - 15.0 g/dL   HCT 38.3 36.0 - 46.0 %   MCV 81.7 78.0 - 100.0 fL   MCH 27.7 26.0 - 34.0 pg   MCHC 33.9 30.0 - 36.0 g/dL   RDW 14.5 11.5 - 15.5 %   Platelets 283 150 - 400 K/uL  I-stat troponin, ED  Result Value Ref Range   Troponin i, poc 0.00 0.00 - 0.08 ng/mL   Comment 3           Dg Chest 2 View  01/04/2016  CLINICAL DATA:  Chest tightness and right-sided paresthesias, onset tonight EXAM: CHEST  2 VIEW COMPARISON:  04/22/2013 FINDINGS: Unchanged borderline heart size. The lungs are clear. The pulmonary vasculature is normal. There is no pleural effusion. Hilar and mediastinal contours are unremarkable and unchanged. IMPRESSION: No active cardiopulmonary disease. Electronically Signed   By: Andreas Newport M.D.   On: 01/04/2016 06:38    MDM  Heart score equals 3. Strongly doubt cardiac etiology with pain after eating worse with lying supine improved with sitting up, nonexertional.. Plan recheck blood pressure 3 weeks. Patient told to avoid greasy fried and fatty foods. Follow-up with Dr.Paz Diagnoses #1 atypical chest pain #2 hyperglycemia #3 elevated blood pressure Final diagnoses:  None        Orlie Dakin, MD 01/04/16 [place], MD 01/04/16 [id number]

## 2016-01-04 NOTE — Discharge Instructions (Signed)
Nonspecific Chest Pain Avoid greasy, fried and fatty foods. Call Dr.Paz today to arrange to be rechecked in his office within the next 3 weeks. Your blood pressure should be rechecked. Today's elevated at 143/95. Blood sugar was mildly elevated at 164. It is often hard to find the cause of chest pain. There is always a chance that your pain could be related to something serious, such as a heart attack or a blood clot in your lungs. Chest pain can also be caused by conditions that are not life-threatening. If you have chest pain, it is very important to follow up with your doctor.  HOME CARE  If you were prescribed an antibiotic medicine, finish it all even if you start to feel better.  Avoid any activities that cause chest pain.  Do not use any tobacco products, including cigarettes, chewing tobacco, or electronic cigarettes. If you need help quitting, ask your doctor.  Do not drink alcohol.  Take medicines only as told by your doctor.  Keep all follow-up visits as told by your doctor. This is important. This includes any further testing if your chest pain does not go away.  Your doctor may tell you to keep your head raised (elevated) while you sleep.  Make lifestyle changes as told by your doctor. These may include:  Getting regular exercise. Ask your doctor to suggest some activities that are safe for you.  Eating a heart-healthy diet. Your doctor or a diet specialist (dietitian) can help you to learn healthy eating options.  Maintaining a healthy weight.  Managing diabetes, if necessary.  Reducing stress. GET HELP IF:  Your chest pain does not go away, even after treatment.  You have a rash with blisters on your chest.  You have a fever. GET HELP RIGHT AWAY IF:  Your chest pain is worse.  You have an increasing cough, or you cough up blood.  You have severe belly (abdominal) pain.  You feel extremely weak.  You pass out (faint).  You have chills.  You have  sudden, unexplained chest discomfort.  You have sudden, unexplained discomfort in your arms, back, neck, or jaw.  You have shortness of breath at any time.  You suddenly start to sweat, or your skin gets clammy.  You feel nauseous.  You vomit.  You suddenly feel light-headed or dizzy.  Your heart begins to beat quickly, or it feels like it is skipping beats. These symptoms may be an emergency. Do not wait to see if the symptoms will go away. Get medical help right away. Call your local emergency services (911 in the [place] Do not drive yourself to the hospital.   This information is not intended to replace advice given to you by your health care provider. Make sure you discuss any questions you have with your health care provider.   Document Released: 12/13/2007 Document Revised: 07/17/2014 Document Reviewed: 01/30/2014 Elsevier Interactive Patient Education [employer].

## 2016-02-15 ENCOUNTER — Encounter: Payer: BLUE CROSS/BLUE SHIELD | Attending: Physical Medicine & Rehabilitation

## 2016-02-15 ENCOUNTER — Encounter: Payer: Self-pay | Admitting: Physical Medicine & Rehabilitation

## 2016-02-15 ENCOUNTER — Ambulatory Visit (HOSPITAL_BASED_OUTPATIENT_CLINIC_OR_DEPARTMENT_OTHER): Payer: BLUE CROSS/BLUE SHIELD | Admitting: Physical Medicine & Rehabilitation

## 2016-02-15 VITALS — BP 157/95 | HR 84

## 2016-02-15 DIAGNOSIS — Z8249 Family history of ischemic heart disease and other diseases of the circulatory system: Secondary | ICD-10-CM | POA: Diagnosis not present

## 2016-02-15 DIAGNOSIS — E669 Obesity, unspecified: Secondary | ICD-10-CM | POA: Insufficient documentation

## 2016-02-15 DIAGNOSIS — E119 Type 2 diabetes mellitus without complications: Secondary | ICD-10-CM | POA: Insufficient documentation

## 2016-02-15 DIAGNOSIS — Z8489 Family history of other specified conditions: Secondary | ICD-10-CM | POA: Insufficient documentation

## 2016-02-15 DIAGNOSIS — D571 Sickle-cell disease without crisis: Secondary | ICD-10-CM | POA: Insufficient documentation

## 2016-02-15 DIAGNOSIS — Z803 Family history of malignant neoplasm of breast: Secondary | ICD-10-CM | POA: Diagnosis not present

## 2016-02-15 DIAGNOSIS — M961 Postlaminectomy syndrome, not elsewhere classified: Secondary | ICD-10-CM | POA: Insufficient documentation

## 2016-02-15 DIAGNOSIS — M549 Dorsalgia, unspecified: Secondary | ICD-10-CM | POA: Diagnosis present

## 2016-02-15 DIAGNOSIS — M5127 Other intervertebral disc displacement, lumbosacral region: Secondary | ICD-10-CM | POA: Diagnosis not present

## 2016-02-15 DIAGNOSIS — K219 Gastro-esophageal reflux disease without esophagitis: Secondary | ICD-10-CM | POA: Diagnosis not present

## 2016-02-15 DIAGNOSIS — Z800 Family history of malignant neoplasm of digestive organs: Secondary | ICD-10-CM | POA: Insufficient documentation

## 2016-02-15 DIAGNOSIS — I10 Essential (primary) hypertension: Secondary | ICD-10-CM | POA: Insufficient documentation

## 2016-02-15 DIAGNOSIS — M25569 Pain in unspecified knee: Secondary | ICD-10-CM | POA: Diagnosis not present

## 2016-02-15 DIAGNOSIS — G894 Chronic pain syndrome: Secondary | ICD-10-CM | POA: Diagnosis not present

## 2016-02-15 DIAGNOSIS — M533 Sacrococcygeal disorders, not elsewhere classified: Secondary | ICD-10-CM

## 2016-02-15 NOTE — Patient Instructions (Signed)
Sacroiliac injection was performed today. A combination of a naming medicine plus a cortisone medicine was injected. The injection was done under x-ray guidance. This procedure has been performed to help reduce low back and buttocks pain as well as potentially hip pain. The duration of this injection is variable lasting from hours to  Months. It may repeated if needed. 

## 2016-02-15 NOTE — Progress Notes (Signed)
Bilateral sacroiliac injections under fluoroscopic guidance  Indication: Low back and buttocks pain not relieved by medication management and other conservative care.  Informed consent was obtained after describing risks and benefits of the procedure with the patient, this includes bleeding, bruising, infection, paralysis and medication side effects. The patient wishes to proceed and has given written consent. The patient was placed in a prone position. The lumbar and sacral area was marked and prepped with Betadine. A 25-gauge 1-1/2 inch needle was inserted into the skin and subcutaneous tissue and 1 mL of 1% lidocaine was injected into each side. Then a 22-gauge 5 inch spinal needle was inserted under fluoroscopic guidance into the left sacroiliac joint. AP and lateral images were utilized. Omnipaque 180x0.5 mL under live fluoroscopy demonstrated no intravascular uptake. Then a solution containing one ML of 6 mg per mL Celestone in 2 ML of 2% lidocaine MPF was injected x1.5 mL. This same procedure was repeated on the right side using the same needle, injectate, and technique. Patient tolerated the procedure well. Post procedure instructions were given. Please see post procedure form. 

## 2016-02-15 NOTE — Progress Notes (Signed)
  [facility]   Name: [name] DOB:07-Jul-1966 MRN: [id number]  Date:02/15/2016  Physician: Alysia Penna, MD    Nurse/CMA: Elberta Lachapelle, CMA  Allergies:  Allergies  Allergen Reactions  . Codeine     Facial rash , lip swelling   . Vicodin [Hydrocodone-Acetaminophen] Swelling and Rash    Pt is able to take percocet without problems    Consent Signed: Yes.    Is patient diabetic? Yes.    CBG today? 132  Pregnant: No. LMP: Patient's last menstrual period was 01/16/2015. (age 52-55)  Anticoagulants: no Anti-inflammatory: no Antibiotics: no  Procedure: bilateral sacroiliac steroid injection  Position: Prone Start Time: 11:53am  End Time: 12:01pm  Fluoro Time: 25  RN/CMA Prisilla Kocsis, CMA Accalia Rigdon, CMA    Time 11:46am 12:05pm    BP 157/95 163/107    Pulse 84 78    Respirations 14 14    O2 Sat 97 97    S/S 6 6    Pain Level 7/10 2/10     D/C home with friend, patient A & O X 3, D/C instructions reviewed, and sits independently.

## 2016-02-18 ENCOUNTER — Ambulatory Visit (INDEPENDENT_AMBULATORY_CARE_PROVIDER_SITE_OTHER): Payer: BLUE CROSS/BLUE SHIELD | Admitting: Internal Medicine

## 2016-02-18 ENCOUNTER — Encounter: Payer: Self-pay | Admitting: Internal Medicine

## 2016-02-18 VITALS — BP 132/80 | HR 86 | Temp 97.8°F | Resp 14 | Ht 65.0 in | Wt 292.5 lb

## 2016-02-18 DIAGNOSIS — M5489 Other dorsalgia: Secondary | ICD-10-CM | POA: Diagnosis not present

## 2016-02-18 DIAGNOSIS — E119 Type 2 diabetes mellitus without complications: Secondary | ICD-10-CM

## 2016-02-18 DIAGNOSIS — Z23 Encounter for immunization: Secondary | ICD-10-CM

## 2016-02-18 DIAGNOSIS — J4520 Mild intermittent asthma, uncomplicated: Secondary | ICD-10-CM

## 2016-02-18 LAB — HEMOGLOBIN A1C: Hgb A1c MFr Bld: 6.6 % — ABNORMAL HIGH (ref 4.6–6.5)

## 2016-02-18 NOTE — Progress Notes (Signed)
Subjective:    Patient ID: [name], female    DOB: [birth year], 50 y.o.   MRN: [id number]  DOS:  02/18/2016 Type of visit - description : Routine checkup Interval history: Went to the ER 01/04/2016 with chest pain, felt to be atypical, workup -labs, x-rays, EKG, troponin -negative. Good med compliance Labs reviewed, due for A1c Ambulatory CBGs -  130, 150  Review of Systems No further chest pain Denies lower extremity paresthesias  Past Medical History:  Diagnosis Date  . Anxiety state, unspecified 05/16/2013  . Arthritis    "right knee" (08/27/2013)  . Asthma    "only when I get a sinus infection which is 1-2 X/yr" (08/27/2013)  . Chronic pain syndrome   . Diabetes ([place])    "borderline" (08/27/2013)  . Failed back syndrome, cervical   . GERD (gastroesophageal reflux disease)   . Hypertension    no meds  . Lumbar radiculopathy, chronic   . Migraine    "once or twice/month now" (08/27/2013)  . Neck pain    axial, s/p cervical fusion C5-6, C6-7   . Obesity   . Sickle cell trait ([place])   . Wears glasses     Past Surgical History:  Procedure Laterality Date  . ANTERIOR CERVICAL DECOMP/DISCECTOMY FUSION  08/27/2013  . ANTERIOR CERVICAL DECOMP/DISCECTOMY FUSION N/A 08/27/2013   Procedure: ANTERIOR CERVICAL /DISCECTOMY FUSION (ACDF) C5-C7  2 LEVELS;  Surgeon: Melina Schools, MD;  Location: [facility];  Service: Orthopedics;  Laterality: N/A;  . BREAST REDUCTION SURGERY Bilateral 09/28/2014   Procedure: MAMMARY REDUCTION  (BREAST) BILATERAL;  Surgeon: Cristine Polio, MD;  Location: [facility];  Service: Plastics;  Laterality: Bilateral;  . BREAST SURGERY    . CHOLECYSTECTOMY  06/1989  . DILATION AND CURETTAGE OF UTERUS    . HERNIA REPAIR    . KNEE ARTHROSCOPY Left 08/2003  . LAPAROSCOPIC BILATERAL SALPINGECTOMY Bilateral 01/22/2015   Procedure: LAPAROSCOPIC BILATERAL SALPINGECTOMY;  Surgeon: Azucena Fallen, MD;  Location: [facility];  Service: Gynecology;  Laterality:  Bilateral;  . LEFT HEART CATHETERIZATION WITH CORONARY ANGIOGRAM N/A 04/15/2013   Procedure: LEFT HEART CATHETERIZATION WITH CORONARY ANGIOGRAM;  Surgeon: Peter M Martinique, MD;  Location: [facility] CATH LAB;  Service: Cardiovascular;  Laterality: N/A;  . LIPOSUCTION Bilateral 09/28/2014   Procedure: LIPOSUCTION;  Surgeon: Cristine Polio, MD;  Location: [facility];  Service: Plastics;  Laterality: Bilateral;  . ROBOTIC ASSISTED TOTAL HYSTERECTOMY N/A 01/22/2015   Procedure: ROBOTIC ASSISTED TOTAL HYSTERECTOMY With Pelvic Washings, Lysis of Adhesions;  Surgeon: Azucena Fallen, MD;  Location: [facility];  Service: Gynecology;  Laterality: N/A;  . SHOULDER ARTHROSCOPY W/ ROTATOR CUFF REPAIR Right 2006, 2007  . TUBAL LIGATION  1991  . UMBILICAL HERNIA REPAIR  2001, 2003  . [place] EXTRACTION  1985    Social History   Social History  . Marital status: Divorced    Spouse name: N/A  . Number of children: 2  . Years of education: N/A   Occupational History  . applied for disability, FT student [employer]   Social History Main Topics  . Smoking status: Never Smoker  . Smokeless tobacco: Never Used  . Alcohol use No  . Drug use: No  . Sexual activity: Not Currently    Birth control/ protection: None   Other Topics Concern  . Not on file   Social History Narrative   Lives in [place] by herself             Medication  List       Accurate as of 02/18/16 11:59 PM. Always use your most recent med list.          baclofen 10 MG tablet Commonly known as:  LIORESAL Take 1 tablet as needed at onset of migraine.   budesonide-formoterol 160-4.5 MCG/ACT inhaler Commonly known as:  SYMBICORT Inhale 2 puffs into the lungs 2 (two) times daily.   canagliflozin 300 MG Tabs tablet Commonly known as:  INVOKANA Take 300 mg by mouth daily before breakfast.   fexofenadine 180 MG tablet Commonly known as:  ALLEGRA Take 180 mg by mouth daily.   folic acid 1 MG tablet Commonly known as:   FOLVITE Take 1 mg by mouth daily.   gabapentin 300 MG capsule Commonly known as:  NEURONTIN Take 1 capsule at night   ibuprofen 200 MG tablet Commonly known as:  ADVIL Take 3 tablets (600 mg total) by mouth every 6 (six) hours as needed.   metFORMIN 1000 MG tablet Commonly known as:  GLUCOPHAGE Take 1 tablet (1,000 mg total) by mouth 2 (two) times daily with a meal.   topiramate 50 MG tablet Commonly known as:  TOPAMAX Take 1 tablet in AM, 2 tablets in PM   vitamin B-12 500 MCG tablet Commonly known as:  CYANOCOBALAMIN Take 1,000 mcg by mouth daily.   vitamin C 100 MG tablet Take 100 mg by mouth 2 (two) times daily.          Objective:   Physical Exam BP 132/80 (BP Location: Left Wrist, Patient Position: Sitting, Cuff Size: Normal)   Pulse 86   Temp 97.8 F (36.6 C) (Oral)   Resp 14   Ht 5\' 5"  (1.651 m)   Wt 292 lb 8 oz (132.7 kg)   LMP 01/16/2015   SpO2 97%   BMI 48.67 kg/m  General:   Well developed, well nourished . NAD.  HEENT:  Normocephalic . Face symmetric, atraumatic Lungs:  CTA B Normal respiratory effort, no intercostal retractions, no accessory muscle use. Heart: RRR,  no murmur.  No pretibial edema bilaterally  DIABETIC FEET EXAM: No lower extremity edema Normal pedal pulses bilaterally Skin normal, nails normal, no calluses Pinprick examination of the feet normal. Neurologic:  alert & oriented X3.  Speech normal, gait appropriate for age and unassisted Psych--  Cognition and judgment appear intact.  Cooperative with normal attention span and concentration.  Behavior appropriate. No anxious or depressed appearing.      Assessment & Plan:     Assessment > DM, no neuropathy Dyslipidemia Elevated BP GERD Asthma-- associated with URIs Anxiety MSK: --DJD. Multiple surgeries.  ---Chronic back pain, local injections back Dr. Ella Bodo --on tramadol, robaxin, Valium pre-procedures ---Neck pain-- Dr Rolena Infante, Dr Nelva Bush Migraine  headaches -- on topamax, baclofen, per Dr Delice Lesch Sickle cell trait CP: cardiac cath 2014 -- normal  PLAN: DM: Continue metformin, invokana; feet exam (-) today; recent BMP satisfactory, check A1c. Asthma: Well controlled, using inhalers as needed Chronic pain: Due to a # of issues like to switch DJD, back  doctors, refer to ortho  at [facility]. Primary care: Prevnar today, recommend a flu shot in few weeks RTC CPX 4-5 months

## 2016-02-18 NOTE — Patient Instructions (Signed)
GO TO THE LAB : Get the blood work     GO TO THE FRONT DESK Schedule your next appointment for a  physical exam in 4-5 months, fasting  Don't forget a flu shot this fall

## 2016-02-18 NOTE — Progress Notes (Signed)
Pre visit review using our clinic review tool, if applicable. No additional management support is needed unless otherwise documented below in the visit note. 

## 2016-02-20 NOTE — Assessment & Plan Note (Signed)
DM: Continue metformin, invokana; feet exam (-) today; recent BMP satisfactory, check A1c. Asthma: Well controlled, using inhalers as needed Chronic pain: Due to a # of issues like to switch DJD, back  doctors, refer to ortho  at [facility]. Primary care: Prevnar today, recommend a flu shot in few weeks RTC CPX 4-5 months

## 2016-03-14 ENCOUNTER — Ambulatory Visit: Payer: BLUE CROSS/BLUE SHIELD | Admitting: Physical Medicine & Rehabilitation

## 2016-03-23 ENCOUNTER — Ambulatory Visit: Payer: BLUE CROSS/BLUE SHIELD | Admitting: Neurology

## 2016-03-23 DIAGNOSIS — Z029 Encounter for administrative examinations, unspecified: Secondary | ICD-10-CM

## 2016-03-28 ENCOUNTER — Encounter: Payer: BLUE CROSS/BLUE SHIELD | Attending: Physical Medicine & Rehabilitation

## 2016-03-28 ENCOUNTER — Encounter: Payer: Self-pay | Admitting: Physical Medicine & Rehabilitation

## 2016-03-28 ENCOUNTER — Ambulatory Visit (HOSPITAL_BASED_OUTPATIENT_CLINIC_OR_DEPARTMENT_OTHER): Payer: BLUE CROSS/BLUE SHIELD | Admitting: Physical Medicine & Rehabilitation

## 2016-03-28 VITALS — BP 136/86 | HR 87

## 2016-03-28 DIAGNOSIS — M533 Sacrococcygeal disorders, not elsewhere classified: Secondary | ICD-10-CM | POA: Insufficient documentation

## 2016-03-28 DIAGNOSIS — E669 Obesity, unspecified: Secondary | ICD-10-CM | POA: Diagnosis not present

## 2016-03-28 DIAGNOSIS — M5127 Other intervertebral disc displacement, lumbosacral region: Secondary | ICD-10-CM | POA: Diagnosis not present

## 2016-03-28 DIAGNOSIS — D571 Sickle-cell disease without crisis: Secondary | ICD-10-CM | POA: Diagnosis not present

## 2016-03-28 DIAGNOSIS — Z8249 Family history of ischemic heart disease and other diseases of the circulatory system: Secondary | ICD-10-CM | POA: Insufficient documentation

## 2016-03-28 DIAGNOSIS — M961 Postlaminectomy syndrome, not elsewhere classified: Secondary | ICD-10-CM | POA: Diagnosis not present

## 2016-03-28 DIAGNOSIS — E119 Type 2 diabetes mellitus without complications: Secondary | ICD-10-CM | POA: Diagnosis not present

## 2016-03-28 DIAGNOSIS — K219 Gastro-esophageal reflux disease without esophagitis: Secondary | ICD-10-CM | POA: Diagnosis not present

## 2016-03-28 DIAGNOSIS — M25569 Pain in unspecified knee: Secondary | ICD-10-CM | POA: Diagnosis not present

## 2016-03-28 DIAGNOSIS — G894 Chronic pain syndrome: Secondary | ICD-10-CM | POA: Diagnosis not present

## 2016-03-28 DIAGNOSIS — I10 Essential (primary) hypertension: Secondary | ICD-10-CM | POA: Insufficient documentation

## 2016-03-28 DIAGNOSIS — Z803 Family history of malignant neoplasm of breast: Secondary | ICD-10-CM | POA: Diagnosis not present

## 2016-03-28 DIAGNOSIS — Z8489 Family history of other specified conditions: Secondary | ICD-10-CM | POA: Diagnosis not present

## 2016-03-28 DIAGNOSIS — Z800 Family history of malignant neoplasm of digestive organs: Secondary | ICD-10-CM | POA: Insufficient documentation

## 2016-03-28 DIAGNOSIS — M549 Dorsalgia, unspecified: Secondary | ICD-10-CM | POA: Diagnosis present

## 2016-03-28 NOTE — Progress Notes (Signed)
Subjective:    Patient ID: [name], female    DOB: 01-14-1966, 50 y.o.   MRN: [id number]  HPI 50 year old female with chronic low back pain. She has a history of cervical fusion, but no lumbar surgeries. She has undergone bilateral sacroiliac injections performed on 02/15/2016 with near complete pain relief in the lumbar spine for a total of 3-4 days This was similar to the prior bilateral sacroiliac injection in May.  Previously had undergone lumbar medial branch blocks without significant relief  Patient's questions about her treatment options. We discussed that this procedure was mainly diagnostic in her case.  Pain Inventory Average Pain 8 Pain Right Now 8 My pain is sharp and stabbing  In the last 24 hours, has pain interfered with the following? General activity 5 Relation with others 5 Enjoyment of life 5 What TIME of day is your pain at its worst? evening, night Sleep (in general) Poor  Pain is worse with: walking, bending, sitting and standing Pain improves with: rest, heat/ice and medication Relief from Meds: 3  Mobility walk without assistance walk with assistance use a cane how many minutes can you walk? 12 ability to climb steps?  yes do you drive?  yes  Function not employed: date last employed . I need assistance with the following:  household duties  Neuro/Psych tingling spasms  Prior Studies Any changes since last visit?  no  Physicians involved in your care Any changes since last visit?  no   Family History  Problem Relation Age of Onset  . Breast cancer Other      GM  . Diabetes Father     F, B, S  . CAD Father     died of MI @ 37  . Kidney disease Father   . Pancreatic cancer Other     GM  . CAD Sister     MI at age 79  . CAD Brother     CHF, died at age 27  . Heart attack Brother     died suddenly @ 79  . Colon cancer Neg Hx    Social History   Social History  . Marital status: Divorced    Spouse name: N/A  .  Number of children: 2  . Years of education: N/A   Occupational History  . applied for disability, FT student [employer]   Social History Main Topics  . Smoking status: Never Smoker  . Smokeless tobacco: Never Used  . Alcohol use No  . Drug use: No  . Sexual activity: Not Currently    Birth control/ protection: None   Other Topics Concern  . None   Social History Narrative   Lives in [place] by herself        Past Surgical History:  Procedure Laterality Date  . ANTERIOR CERVICAL DECOMP/DISCECTOMY FUSION  08/27/2013  . ANTERIOR CERVICAL DECOMP/DISCECTOMY FUSION N/A 08/27/2013   Procedure: ANTERIOR CERVICAL /DISCECTOMY FUSION (ACDF) C5-C7  2 LEVELS;  Surgeon: Melina Schools, MD;  Location: [facility];  Service: Orthopedics;  Laterality: N/A;  . BREAST REDUCTION SURGERY Bilateral 09/28/2014   Procedure: MAMMARY REDUCTION  (BREAST) BILATERAL;  Surgeon: Cristine Polio, MD;  Location: [facility];  Service: Plastics;  Laterality: Bilateral;  . BREAST SURGERY    . CHOLECYSTECTOMY  06/1989  . DILATION AND CURETTAGE OF UTERUS    . HERNIA REPAIR    . KNEE ARTHROSCOPY Left 08/2003  . LAPAROSCOPIC BILATERAL SALPINGECTOMY Bilateral 01/22/2015   Procedure: LAPAROSCOPIC BILATERAL SALPINGECTOMY;  Surgeon: Azucena Fallen, MD;  Location: [facility];  Service: Gynecology;  Laterality: Bilateral;  . LEFT HEART CATHETERIZATION WITH CORONARY ANGIOGRAM N/A 04/15/2013   Procedure: LEFT HEART CATHETERIZATION WITH CORONARY ANGIOGRAM;  Surgeon: Peter M Martinique, MD;  Location: [facility] CATH LAB;  Service: Cardiovascular;  Laterality: N/A;  . LIPOSUCTION Bilateral 09/28/2014   Procedure: LIPOSUCTION;  Surgeon: Cristine Polio, MD;  Location: [facility];  Service: Plastics;  Laterality: Bilateral;  . ROBOTIC ASSISTED TOTAL HYSTERECTOMY N/A 01/22/2015   Procedure: ROBOTIC ASSISTED TOTAL HYSTERECTOMY With Pelvic Washings, Lysis of Adhesions;  Surgeon: Azucena Fallen, MD;  Location: [facility];  Service: Gynecology;   Laterality: N/A;  . SHOULDER ARTHROSCOPY W/ ROTATOR CUFF REPAIR Right 2006, 2007  . TUBAL LIGATION  1991  . UMBILICAL HERNIA REPAIR  2001, 2003  . WISDOM TOOTH EXTRACTION  1985   Past Medical History:  Diagnosis Date  . Anxiety state, unspecified 05/16/2013  . Arthritis    "right knee" (08/27/2013)  . Asthma    "only when I get a sinus infection which is 1-2 X/yr" (08/27/2013)  . Chronic pain syndrome   . Diabetes ([place])    "borderline" (08/27/2013)  . Failed back syndrome, cervical   . GERD (gastroesophageal reflux disease)   . Hypertension    no meds  . Lumbar radiculopathy, chronic   . Migraine    "once or twice/month now" (08/27/2013)  . Neck pain    axial, s/p cervical fusion C5-6, C6-7   . Obesity   . Sickle cell trait ([place])   . Wears glasses    BP 136/86 (BP Location: Right Arm, Patient Position: Sitting, Cuff Size: Large)   Pulse 87   LMP 01/16/2015   SpO2 97%   Opioid Risk Score:   Fall Risk Score:  `1  Depression screen PHQ 2/9  Depression screen [facility] 2/9 02/18/2016 10/18/2015 08/09/2015 04/16/2015 01/18/2015 02/14/2013  Decreased Interest 0 0 0 0 0 0  Down, Depressed, Hopeless 0 0 0 0 0 0  PHQ - 2 Score 0 0 0 0 0 0  Altered sleeping - - - - 2 -  Tired, decreased energy - - - - 2 -  Change in appetite - - - - 2 -  Feeling bad or failure about yourself  - - - - 0 -  Trouble concentrating - - - - 0 -  Moving slowly or fidgety/restless - - - - 0 -  Suicidal thoughts - - - - 0 -  PHQ-9 Score - - - - 6 -    Review of Systems  Constitutional: Negative.   HENT: Negative.   Eyes: Negative.   Cardiovascular: Negative.   Gastrointestinal: Negative.   Endocrine: Negative.   Genitourinary: Negative.   Musculoskeletal: Positive for back pain.       Spasms  Allergic/Immunologic: Negative.   Neurological: Negative.        Tingling  Hematological: Negative.   Psychiatric/Behavioral: Negative.        Objective:   Physical Exam  Constitutional: She is oriented to  person, place, and time. She appears well-developed and well-nourished.  HENT:  Head: Normocephalic and atraumatic.  Eyes: Conjunctivae and EOM are normal. Pupils are equal, round, and reactive to light.  Neck: Normal range of motion.  Neurological: She is alert and oriented to person, place, and time.  Psychiatric: She has a normal mood and affect.  Nursing note and vitals reviewed.   Obese female in no acute distress. Mood and affect are appropriate.  She has tenderness palpation bilateral PSIS area. No tenderness over the greater trochanters. No tenderness over the lumbar paraspinal muscles.      Assessment & Plan:  1. 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 We discussed treatment options which include chiropractic care for joint mobilization, sacroiliac radio frequency, as well as fusion surgery. We will first try chiropractic care. If she fails this, may consider radiofrequency procedure.  Discussed with patient agrees with plan. Return to clinic in 6 weeks to follow up

## 2016-03-28 NOTE — Patient Instructions (Addendum)
[name] at [facility] chiropractic call [phone], try for a month then see me back to further assess          Sacroiliac Joint Dysfunction Sacroiliac joint dysfunction is a condition that causes inflammation on one or both sides of the sacroiliac (SI) joint. The SI joint connects the lower part of the spine (sacrum) with the two upper portions of the pelvis (ilium). This condition causes deep aching or burning pain in the low back. In some cases, the pain may also spread into one or both buttocks or hips or spread down the legs. CAUSES This condition may be caused by:  Pregnancy. During pregnancy, extra stress is put on the SI joints because the pelvis widens.  Injury, such as:  Car accidents.  Sport-related injuries.  Work-related injuries.  Having one leg that is shorter than the other.  Conditions that affect the joints, such as:  Rheumatoid arthritis.  Gout.  Psoriatic arthritis.  Joint infection (septic arthritis). Sometimes, the cause of SI joint dysfunction is not known. SYMPTOMS Symptoms of this condition include:  Aching or burning pain in the lower back. The pain may also spread to other areas, such as:  Buttocks.  Groin.  Thighs and legs.  Muscle spasms in or around the painful areas.  Increased pain when standing, walking, running, stair climbing, bending, or lifting. DIAGNOSIS Your health care provider will do a physical exam and take your medical history. During the exam, the health care provider may move one or both of your legs to different positions to check for pain. Various tests may be done to help verify the diagnosis, including:  Imaging tests to look for other causes of pain. These may include:  MRI.  CT scan.  Bone scan.  Diagnostic injection. A numbing medicine is injected into the SI joint using a needle. If the pain is temporarily improved or stopped after the injection, this can indicate that SI joint dysfunction is the  problem. TREATMENT Treatment may vary depending on the cause and severity of your condition. Treatment options may include:  Applying ice or heat to the lower back area. This can help to reduce pain and muscle spasms.  Medicines to relieve pain or inflammation or to relax the muscles.  Wearing a back brace (sacroiliac brace) to help support the joint while your back is healing.  Physical therapy to increase muscle strength around the joint and flexibility at the joint. This may also involve learning proper body positions and ways of moving to relieve stress on the joint.  Direct manipulation of the SI joint.  Injections of steroid medicine into the joint in order to reduce pain and swelling.  Radiofrequency ablation to burn away nerves that are carrying pain messages from the joint.  Use of a device that provides electrical stimulation in order to reduce pain at the joint.  Surgery to put in screws and plates that limit or prevent joint motion. This is rare. HOME CARE INSTRUCTIONS  Rest as needed. Limit your activities as directed by your health care provider.  Take medicines only as directed by your health care provider.  If directed, apply ice to the affected area:  Put ice in a plastic bag.  Place a towel between your skin and the bag.  Leave the ice on for 20 minutes, 2-3 times per day.  Use a heating pad or a moist heat pack as directed by your health care provider.  Exercise as directed by your health care provider or  physical therapist.  Keep all follow-up visits as directed by your health care provider. This is important. SEEK MEDICAL CARE IF:  Your pain is not controlled with medicine.  You have a fever.  You have increasingly severe pain. SEEK IMMEDIATE MEDICAL CARE IF:  You have weakness, numbness, or tingling in your legs or feet.  You lose control of your bladder or bowel.   This information is not intended to replace advice given to you by your  health care provider. Make sure you discuss any questions you have with your health care provider.   Document Released: 09/22/2008 Document Revised: 11/10/2014 Document Reviewed: 03/03/2014 Elsevier Interactive Patient Education [employer].

## 2016-05-09 ENCOUNTER — Ambulatory Visit: Payer: BLUE CROSS/BLUE SHIELD | Admitting: Physical Medicine & Rehabilitation

## 2016-06-13 ENCOUNTER — Encounter: Payer: Self-pay | Admitting: Physical Medicine & Rehabilitation

## 2016-06-13 ENCOUNTER — Encounter: Payer: BLUE CROSS/BLUE SHIELD | Attending: Physical Medicine & Rehabilitation

## 2016-06-13 ENCOUNTER — Ambulatory Visit (HOSPITAL_BASED_OUTPATIENT_CLINIC_OR_DEPARTMENT_OTHER): Payer: BLUE CROSS/BLUE SHIELD | Admitting: Physical Medicine & Rehabilitation

## 2016-06-13 VITALS — BP 140/97 | HR 82 | Resp 16

## 2016-06-13 DIAGNOSIS — Z8489 Family history of other specified conditions: Secondary | ICD-10-CM | POA: Insufficient documentation

## 2016-06-13 DIAGNOSIS — E669 Obesity, unspecified: Secondary | ICD-10-CM | POA: Insufficient documentation

## 2016-06-13 DIAGNOSIS — M25569 Pain in unspecified knee: Secondary | ICD-10-CM | POA: Insufficient documentation

## 2016-06-13 DIAGNOSIS — M961 Postlaminectomy syndrome, not elsewhere classified: Secondary | ICD-10-CM | POA: Diagnosis not present

## 2016-06-13 DIAGNOSIS — M533 Sacrococcygeal disorders, not elsewhere classified: Secondary | ICD-10-CM | POA: Diagnosis not present

## 2016-06-13 DIAGNOSIS — Z800 Family history of malignant neoplasm of digestive organs: Secondary | ICD-10-CM | POA: Diagnosis not present

## 2016-06-13 DIAGNOSIS — M5127 Other intervertebral disc displacement, lumbosacral region: Secondary | ICD-10-CM | POA: Insufficient documentation

## 2016-06-13 DIAGNOSIS — Z803 Family history of malignant neoplasm of breast: Secondary | ICD-10-CM | POA: Diagnosis not present

## 2016-06-13 DIAGNOSIS — D571 Sickle-cell disease without crisis: Secondary | ICD-10-CM | POA: Insufficient documentation

## 2016-06-13 DIAGNOSIS — K219 Gastro-esophageal reflux disease without esophagitis: Secondary | ICD-10-CM | POA: Insufficient documentation

## 2016-06-13 DIAGNOSIS — M7542 Impingement syndrome of left shoulder: Secondary | ICD-10-CM | POA: Insufficient documentation

## 2016-06-13 DIAGNOSIS — I10 Essential (primary) hypertension: Secondary | ICD-10-CM | POA: Diagnosis not present

## 2016-06-13 DIAGNOSIS — E119 Type 2 diabetes mellitus without complications: Secondary | ICD-10-CM | POA: Insufficient documentation

## 2016-06-13 DIAGNOSIS — M549 Dorsalgia, unspecified: Secondary | ICD-10-CM | POA: Diagnosis present

## 2016-06-13 DIAGNOSIS — G894 Chronic pain syndrome: Secondary | ICD-10-CM | POA: Diagnosis not present

## 2016-06-13 DIAGNOSIS — Z8249 Family history of ischemic heart disease and other diseases of the circulatory system: Secondary | ICD-10-CM | POA: Insufficient documentation

## 2016-06-13 NOTE — Patient Instructions (Signed)
Patient will need to avoid overhead activity with left arm for one week.  Next appointment will be for sacroiliac nerve blocks

## 2016-06-13 NOTE — Progress Notes (Signed)
Subjective:    Patient ID: [name], female    DOB: Nov 26, 1965, 50 y.o.   MRN: [id number]  HPI Cervical spine pain is worsening and for the last few weeks has had increasing pain in left shoulder Right rotator cuff repair 2006 and 2007, subacromial decompression Low back pain is starting to worsen again as well No falls. No other trauma to the left shoulder area. Has had chronic numbness and tingling in Left hand for the last 9 months.  Seen by Dr Jimmye Norman , chiropracter only one treatment, 35.00 per visit, Did not continue this  Pain Inventory Average Pain 7 Pain Right Now 7 My pain is sharp and stabbing  In the last 24 hours, has pain interfered with the following? General activity 5 Relation with others 5 Enjoyment of life 5 What TIME of day is your pain at its worst? night Sleep (in general) Poor  Pain is worse with: walking, bending, sitting and standing Pain improves with: rest, heat/ice and medication Relief from Meds: 3  Mobility walk with assistance use a cane how many minutes can you walk? 12 ability to climb steps?  yes do you drive?  yes  Function not employed: date last employed . I need assistance with the following:  household duties  Neuro/Psych tingling trouble walking  Prior Studies Any changes since last visit?  no  Physicians involved in your care Any changes since last visit?  no   Family History  Problem Relation Age of Onset  . Breast cancer Other      GM  . Diabetes Father     F, B, S  . CAD Father     died of MI @ 15  . Kidney disease Father   . Pancreatic cancer Other     GM  . CAD Sister     MI at age 42  . CAD Brother     CHF, died at age 65  . Heart attack Brother     died suddenly @ 19  . Colon cancer Neg Hx    Social History   Social History  . Marital status: Divorced    Spouse name: N/A  . Number of children: 2  . Years of education: N/A   Occupational History  . applied for disability, FT student  [employer]   Social History Main Topics  . Smoking status: Never Smoker  . Smokeless tobacco: Never Used  . Alcohol use No  . Drug use: No  . Sexual activity: Not Currently    Birth control/ protection: None   Other Topics Concern  . None   Social History Narrative   Lives in [place] by herself        Past Surgical History:  Procedure Laterality Date  . ANTERIOR CERVICAL DECOMP/DISCECTOMY FUSION  08/27/2013  . ANTERIOR CERVICAL DECOMP/DISCECTOMY FUSION N/A 08/27/2013   Procedure: ANTERIOR CERVICAL /DISCECTOMY FUSION (ACDF) C5-C7  2 LEVELS;  Surgeon: Melina Schools, MD;  Location: [facility];  Service: Orthopedics;  Laterality: N/A;  . BREAST REDUCTION SURGERY Bilateral 09/28/2014   Procedure: MAMMARY REDUCTION  (BREAST) BILATERAL;  Surgeon: Cristine Polio, MD;  Location: [facility];  Service: Plastics;  Laterality: Bilateral;  . BREAST SURGERY    . CHOLECYSTECTOMY  06/1989  . DILATION AND CURETTAGE OF UTERUS    . HERNIA REPAIR    . KNEE ARTHROSCOPY Left 08/2003  . LAPAROSCOPIC BILATERAL SALPINGECTOMY Bilateral 01/22/2015   Procedure: LAPAROSCOPIC BILATERAL SALPINGECTOMY;  Surgeon: Azucena Fallen, MD;  Location:  [facility];  Service: Gynecology;  Laterality: Bilateral;  . LEFT HEART CATHETERIZATION WITH CORONARY ANGIOGRAM N/A 04/15/2013   Procedure: LEFT HEART CATHETERIZATION WITH CORONARY ANGIOGRAM;  Surgeon: Peter M Martinique, MD;  Location: [facility] CATH LAB;  Service: Cardiovascular;  Laterality: N/A;  . LIPOSUCTION Bilateral 09/28/2014   Procedure: LIPOSUCTION;  Surgeon: Cristine Polio, MD;  Location: [facility];  Service: Plastics;  Laterality: Bilateral;  . ROBOTIC ASSISTED TOTAL HYSTERECTOMY N/A 01/22/2015   Procedure: ROBOTIC ASSISTED TOTAL HYSTERECTOMY With Pelvic Washings, Lysis of Adhesions;  Surgeon: Azucena Fallen, MD;  Location: [facility];  Service: Gynecology;  Laterality: N/A;  . SHOULDER ARTHROSCOPY W/ ROTATOR CUFF REPAIR Right 2006, 2007  . TUBAL LIGATION  1991  .  UMBILICAL HERNIA REPAIR  2001, 2003  . WISDOM TOOTH EXTRACTION  1985   Past Medical History:  Diagnosis Date  . Anxiety state, unspecified 05/16/2013  . Arthritis    "right knee" (08/27/2013)  . Asthma    "only when I get a sinus infection which is 1-2 X/yr" (08/27/2013)  . Chronic pain syndrome   . Diabetes ([place])    "borderline" (08/27/2013)  . Failed back syndrome, cervical   . GERD (gastroesophageal reflux disease)   . Hypertension    no meds  . Lumbar radiculopathy, chronic   . Migraine    "once or twice/month now" (08/27/2013)  . Neck pain    axial, s/p cervical fusion C5-6, C6-7   . Obesity   . Sickle cell trait ([place])   . Wears glasses    BP (!) 140/97   Pulse 82   Resp 16   LMP 01/16/2015   SpO2 97%   Opioid Risk Score:   Fall Risk Score:  `1  Depression screen PHQ 2/9  Depression screen [facility] 2/9 06/13/2016 02/18/2016 10/18/2015 08/09/2015 04/16/2015 01/18/2015 02/14/2013  Decreased Interest 2 0 0 0 0 0 0  Down, Depressed, Hopeless 2 0 0 0 0 0 0  PHQ - 2 Score 4 0 0 0 0 0 0  Altered sleeping - - - - - 2 -  Tired, decreased energy - - - - - 2 -  Change in appetite - - - - - 2 -  Feeling bad or failure about yourself  - - - - - 0 -  Trouble concentrating - - - - - 0 -  Moving slowly or fidgety/restless - - - - - 0 -  Suicidal thoughts - - - - - 0 -  PHQ-9 Score - - - - - 6 -    Review of Systems  Constitutional: Negative.   HENT: Negative.   Eyes: Negative.   Respiratory: Negative.   Cardiovascular: Negative.   Gastrointestinal: Negative.   Endocrine: Negative.   Genitourinary: Negative.   Musculoskeletal: Negative.   Skin: Negative.   Allergic/Immunologic: Negative.   Neurological: Negative.   Hematological: Negative.   Psychiatric/Behavioral: Negative.   All other systems reviewed and are negative.      Objective:   Physical Exam  Constitutional: She is oriented to person, place, and time. She appears well-developed and well-nourished.  HENT:  Head:  Normocephalic and atraumatic.  Eyes: Conjunctivae and EOM are normal. Pupils are equal, round, and reactive to light.  Neck:  Reduced cervical range of motion, flexion, extension, lateral bending and rotation  Musculoskeletal:       Left shoulder: She exhibits decreased range of motion and tenderness. She exhibits no swelling, no effusion and no deformity.  Neurological: She is alert  and oriented to person, place, and time.  Skin: Skin is warm and dry.  Nursing note and vitals reviewed.  Decreased left C5-C6. Light touch sensation  Positive Faber's SI joint. Bilateral Negative straight leg raising Positive Michel Bickers sign, left Negative drop arm and left Patient has tenderness to palpation in the anterior, posterior and middle deltoid area on the left side only      Assessment & Plan:  1. Sacroiliac pain, chronic. Has had short-term relief with sacroiliac injections. We discussed radiofrequency neurotomy. Given that she could not afford to see the chiropractor for sacroiliac mobilization. We'll first trial, L4 medial branch block, L5, S1, S2, S3 dorsal ramus injections under fluoroscopic guidance on the right side.  2. Left shoulder pain has a prior history of right shoulder rotator cuff impingement Suspect similar process going on. On the left side. Does not appear to be posttraumatic. Would check x-rays of the left shoulder  Shoulder injection Left subacromial    Indication: Left Shoulder pain not relieved by medication management and other conservative care.  Informed consent was obtained after describing risks and benefits of the procedure with the patient, this includes bleeding, bruising, infection and medication side effects. The patient wishes to proceed and has given written consent. Patient was placed in a seated position. The Left shoulder was marked and prepped with betadine in the subacromial area. A 25-gauge 1-1/2 inch needle was inserted into the subacromial area.  After negative draw back for blood, a solution containing 1 mL of 6 mg per ML betamethasone and 4 mL of 1% lidocaine was injected. A band aid was applied. The patient tolerated the procedure well. Post procedure instructions were given.  Avoid overhead activity times 1 week, left arm only

## 2016-06-26 ENCOUNTER — Encounter: Payer: Self-pay | Admitting: Internal Medicine

## 2016-06-26 ENCOUNTER — Ambulatory Visit (INDEPENDENT_AMBULATORY_CARE_PROVIDER_SITE_OTHER): Payer: BLUE CROSS/BLUE SHIELD | Admitting: Internal Medicine

## 2016-06-26 ENCOUNTER — Ambulatory Visit (HOSPITAL_BASED_OUTPATIENT_CLINIC_OR_DEPARTMENT_OTHER)
Admission: RE | Admit: 2016-06-26 | Discharge: 2016-06-26 | Disposition: A | Discharge status: Home / Self Care | Payer: BLUE CROSS/BLUE SHIELD | Source: Ambulatory Visit | Attending: Internal Medicine | Admitting: Internal Medicine

## 2016-06-26 VITALS — BP 124/78 | HR 77 | Temp 97.6°F | Resp 14 | Ht 65.0 in | Wt 291.4 lb

## 2016-06-26 DIAGNOSIS — M19012 Primary osteoarthritis, left shoulder: Secondary | ICD-10-CM | POA: Insufficient documentation

## 2016-06-26 DIAGNOSIS — E119 Type 2 diabetes mellitus without complications: Secondary | ICD-10-CM

## 2016-06-26 DIAGNOSIS — E785 Hyperlipidemia, unspecified: Secondary | ICD-10-CM | POA: Diagnosis not present

## 2016-06-26 DIAGNOSIS — Z0000 Encounter for general adult medical examination without abnormal findings: Secondary | ICD-10-CM | POA: Diagnosis not present

## 2016-06-26 DIAGNOSIS — M25512 Pain in left shoulder: Secondary | ICD-10-CM

## 2016-06-26 DIAGNOSIS — G8929 Other chronic pain: Secondary | ICD-10-CM | POA: Insufficient documentation

## 2016-06-26 LAB — BASIC METABOLIC PANEL
BUN: 14 mg/dL (ref 6–23)
CO2: 25 mEq/L (ref 19–32)
Calcium: 8.9 mg/dL (ref 8.4–10.5)
Chloride: 103 mEq/L (ref 96–112)
Creatinine, Ser: 0.74 mg/dL (ref 0.40–1.20)
GFR: 106.91 mL/min (ref >60.00)
Glucose, Bld: 143 mg/dL — ABNORMAL HIGH (ref 70–99)
Potassium: 3.8 mEq/L (ref 3.5–5.1)
Sodium: 137 mEq/L (ref 135–145)

## 2016-06-26 LAB — HEMOGLOBIN A1C: Hgb A1c MFr Bld: 6.9 % — ABNORMAL HIGH (ref 4.6–6.5)

## 2016-06-26 LAB — LIPID PANEL
Cholesterol: 202 mg/dL — ABNORMAL HIGH (ref 0–200)
HDL: 48.4 mg/dL (ref >39.00)
LDL Cholesterol: 132 mg/dL — ABNORMAL HIGH (ref 0–99)
NonHDL: 153.2
Total CHOL/HDL Ratio: 4
Triglycerides: 104 mg/dL (ref 0.0–149.0)
VLDL: 20.8 mg/dL (ref 0.0–40.0)

## 2016-06-26 LAB — AST: AST: 12 U/L (ref 0–37)

## 2016-06-26 LAB — ALT: ALT: 10 U/L (ref 0–35)

## 2016-06-26 NOTE — Assessment & Plan Note (Addendum)
Tdap 2014; pnm shot-2016; prevnar 2017 . Flu shot once she is better from her upper respiratory symptoms.   Never had a cscope Female care per gyn,Dr Benjie Karvonen: was seen 2017, had a MMG (no report), h/o abn PAPs  + FH: DM,  breast cancer and CAD. Labs: BMP, AST, ALT, FLP, A1c Diet-exercise discussed

## 2016-06-26 NOTE — Assessment & Plan Note (Signed)
DM: Continue metformin, invokana;  check A1c. Asthma: Well controlled except for recent URI. Conservative treatment. See instructions. Chronic pain:  Follow-up by Dr. Dianna Limbo, recently complaining of right shoulder pain, patient request a x-ray. Will do. She also sees the orthopedic doctors at [facility] about the loss of her sister. Knows to call if she feels medication is needed. Recommend formal counseling RTC 4-5 months.

## 2016-06-26 NOTE — Progress Notes (Signed)
Pre visit review using our clinic review tool, if applicable. No additional management support is needed unless otherwise documented below in the visit note. 

## 2016-06-26 NOTE — Progress Notes (Signed)
Subjective:    Patient ID: [name], female    DOB: [birth year], 50 y.o.   MRN: [id number]  DOS:  06/26/2016 Type of visit - description : cpx Interval history: In addition to CPX we discussed several issues.    Review of Systems   Developed a URI a week ago: Sinus pressure, cough with minimal sputum production. No fever chills, runny nose or sore throat. Using OTCs which has helped the sinus pressure. Mild wheezing, taking her inhalers more often.  Constitutional: No fever. No chills. No unexplained wt changes. No unusual sweats  HEENT: No dental problems, no ear discharge, no facial swelling, no voice changes. No eye discharge, no eye  redness , no  intolerance to light   Respiratory:  See above  Cardiovascular:  CP anteriorly with cough only, no leg swelling , no  Palpitations  GI: no nausea, no vomiting, no diarrhea , no  abdominal pain.  No blood in the stools. No dysphagia, no odynophagia    Endocrine: No polyphagia, no polyuria , no polydipsia  GU: No dysuria, gross hematuria, difficulty urinating. No urinary urgency, no frequency.  Musculoskeletal: Left shoulder pain, saw physical medicine, request a x-ray, had a local injection w/ some help   Skin: No change in the color of the skin, palor , no  Rash  Allergic, immunologic: No environmental allergies , no  food allergies  Neurological: No dizziness no  syncope. No headaches. No diplopia, no slurred, no slurred speech, no motor deficits, no facial  Numbness  Hematological: No enlarged lymph nodes, no easy bruising , no unusual bleedings  Psychiatry: No suicidal ideas, no hallucinations, no beavior problems, no confusion.  Loss of her sister recently to cancer, obviously affected by the issue.    Past Medical History:  Diagnosis Date  . Anxiety state, unspecified 05/16/2013  . Arthritis    "right knee" (08/27/2013)  . Asthma    "only when I get a sinus infection which is 1-2 X/yr" (08/27/2013)  . Chronic  pain syndrome   . Diabetes ([place])    "borderline" (08/27/2013)  . Failed back syndrome, cervical   . GERD (gastroesophageal reflux disease)   . Hypertension    no meds  . Lumbar radiculopathy, chronic   . Migraine    "once or twice/month now" (08/27/2013)  . Neck pain    axial, s/p cervical fusion C5-6, C6-7   . Obesity   . Sickle cell trait ([place])   . Wears glasses     Past Surgical History:  Procedure Laterality Date  . ANTERIOR CERVICAL DECOMP/DISCECTOMY FUSION  08/27/2013  . ANTERIOR CERVICAL DECOMP/DISCECTOMY FUSION N/A 08/27/2013   Procedure: ANTERIOR CERVICAL /DISCECTOMY FUSION (ACDF) C5-C7  2 LEVELS;  Surgeon: Melina Schools, MD;  Location: [facility];  Service: Orthopedics;  Laterality: N/A;  . BREAST REDUCTION SURGERY Bilateral 09/28/2014   Procedure: MAMMARY REDUCTION  (BREAST) BILATERAL;  Surgeon: Cristine Polio, MD;  Location: [facility];  Service: Plastics;  Laterality: Bilateral;  . BREAST SURGERY    . CHOLECYSTECTOMY  06/1989  . DILATION AND CURETTAGE OF UTERUS    . HERNIA REPAIR    . KNEE ARTHROSCOPY Left 08/2003  . LAPAROSCOPIC BILATERAL SALPINGECTOMY Bilateral 01/22/2015   Procedure: LAPAROSCOPIC BILATERAL SALPINGECTOMY;  Surgeon: Azucena Fallen, MD;  Location: [facility];  Service: Gynecology;  Laterality: Bilateral;  . LEFT HEART CATHETERIZATION WITH CORONARY ANGIOGRAM N/A 04/15/2013   Procedure: LEFT HEART CATHETERIZATION WITH CORONARY ANGIOGRAM;  Surgeon: Peter M Martinique, MD;  Location:  [facility];  Service: Cardiovascular;  Laterality: N/A;  . LIPOSUCTION Bilateral 09/28/2014   Procedure: LIPOSUCTION;  Surgeon: Cristine Polio, MD;  Location: [facility];  Service: Plastics;  Laterality: Bilateral;  . ROBOTIC ASSISTED TOTAL HYSTERECTOMY N/A 01/22/2015   has her ovaries---ROBOTIC ASSISTED TOTAL HYSTERECTOMY With Pelvic Washings, Lysis of Adhesions;  Surgeon: Azucena Fallen, MD;  Location: [facility];  Service: Gynecology;  Laterality: N/A;  . SHOULDER  ARTHROSCOPY W/ ROTATOR CUFF REPAIR Right 2006, 2007  . TUBAL LIGATION  1991  . UMBILICAL HERNIA REPAIR  2001, 2003  . [place] EXTRACTION  1985    Social History   Social History  . Marital status: Divorced    Spouse name: N/A  . Number of children: 2  . Years of education: N/A   Occupational History  . 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 Smoking status: Never Smoker  . Smokeless tobacco: Never Used  . Alcohol use No  . Drug use: No  . Sexual activity: Not Currently    Birth control/ protection: None   Other Topics Concern  . Not on file   Social History Narrative   Lives in [place] by herself        Family History  Problem Relation Age of Onset  . Breast cancer Other      GM  . Diabetes Father     F, B, S  . CAD Father     died of MI @ 46  . Kidney disease Father   . Pancreatic cancer Other     GM  . CAD Sister     MI at age 27  . CAD Brother     CHF, died at age 48  . Heart attack Brother     died suddenly @ 63  . Leukemia Sister   . Colon cancer Neg Hx       Allergies as of 06/26/2016      Reactions   Codeine    Facial rash , lip swelling    Vicodin [hydrocodone-acetaminophen] Swelling, Rash   Pt is able to take percocet without problems      Medication List       Accurate as of 06/26/16  8:59 PM. Always use your most recent med list.          baclofen 10 MG tablet Commonly known as:  LIORESAL Take 1 tablet as needed at onset of migraine.   budesonide-formoterol 160-4.5 MCG/ACT inhaler Commonly known as:  SYMBICORT Inhale 2 puffs into the lungs 2 (two) times daily.   canagliflozin 300 MG Tabs tablet Commonly known as:  INVOKANA Take 300 mg by mouth daily before breakfast.   fexofenadine 180 MG tablet Commonly known as:  ALLEGRA Take 180 mg by mouth daily.   folic acid 1 MG tablet Commonly known as:  FOLVITE Take 1 mg by mouth daily.   gabapentin 300 MG capsule Commonly known as:   NEURONTIN Take 1 capsule at night   ibuprofen 200 MG tablet Commonly known as:  ADVIL Take 3 tablets (600 mg total) by mouth every 6 (six) hours as needed.   metFORMIN 1000 MG tablet Commonly known as:  GLUCOPHAGE Take 1 tablet (1,000 mg total) by mouth 2 (two) times daily with a meal.   topiramate 50 MG tablet Commonly known as:  TOPAMAX Take 1 tablet in AM, 2 tablets in PM   vitamin B-12 500 MCG tablet Commonly known as:  CYANOCOBALAMIN Take 1,000 mcg by mouth daily.   vitamin C 100 MG tablet Take 100 mg by mouth 2 (two) times daily.          Objective:   Physical Exam BP 124/78 (BP Location: Left Arm, Patient Position: Sitting, Cuff Size: Normal)   Pulse 77   Temp 97.6 F (36.4 C) (Oral)   Resp 14   Ht 5\' 5"  (1.651 m)   Wt 291 lb 6 oz (132.2 kg)   LMP 01/16/2015   SpO2 98%   BMI 48.49 kg/m   General:   Well developed, well nourished . NAD.  Neck: No  thyromegaly  HEENT:  Normocephalic . Face symmetric, atraumatic. Nose congested, unable to see right TM due to wax, left TM normal. Throat: No red, symmetric. Lungs:  CTA B, very few rhonchi with cough, no wheezing or prolonged expiratory time. Normal respiratory effort, no intercostal retractions, no accessory muscle use. Heart: RRR,  no murmur.  No pretibial edema bilaterally  Abdomen:  Not distended, soft, non-tender. No rebound or rigidity.   Skin: Exposed areas without rash. Not pale. Not jaundice Neurologic:  alert & oriented X3.  Speech normal, gait appropriate for age and unassisted Strength symmetric and appropriate for age.  Psych: Cognition and judgment appear intact.  Cooperative with normal attention span and concentration.  Behavior appropriate. No anxious or depressed appearing.    Assessment & Plan:   Assessment  DM, no neuropathy Dyslipidemia Elevated BP GERD Asthma-- associated with URIs Anxiety MSK: --DJD. Multiple surgeries.  ---Chronic back pain, local injections back Dr.  Ella Bodo --on tramadol, robaxin, Valium pre-procedures ---Neck pain-- Dr Rolena Infante, Dr Nelva Bush Migraine headaches -- on topamax, baclofen, per Dr Delice Lesch Sickle cell trait CP: cardiac cath 2014 -- normal  PLAN: DM: Continue metformin, invokana;  check A1c. Asthma: Well controlled except for recent URI. Conservative treatment. See instructions. Chronic pain:  Follow-up by Dr. Dianna Limbo, recently complaining of right shoulder pain, patient request a x-ray. Will do. She also sees the orthopedic doctors at [facility] about the loss of her sister. Knows to call if she feels medication is needed. Recommend formal counseling RTC 4-5 months.

## 2016-06-26 NOTE — Patient Instructions (Addendum)
GO TO THE LAB : Get the blood work     GO TO THE FRONT DESK Schedule your next appointment for a   checkup in 4-5 months     ===================  Rest, fluids , tylenol  For cough:  Take Mucinex DM twice a day as needed until better  For nasal congestion: Use OTC Nasocort or Flonase : 2 nasal sprays on each side of the nose in the morning until you feel better   Avoid decongestants such as  Pseudoephedrine or phenylephrine    Call if not gradually better over the next  10 days  Call anytime if the symptoms are severe  ==================== Get your flu shot once better

## 2016-06-28 MED ORDER — METFORMIN HCL 1000 MG PO TABS: ORAL_TABLET | ORAL | 5 refills | Status: DC

## 2016-06-28 MED ORDER — ATORVASTATIN CALCIUM 20 MG PO TABS: 20.0000 mg | ORAL_TABLET | Freq: Every day | ORAL | 3 refills | Status: DC

## 2016-06-28 NOTE — Addendum Note (Signed)
Addended by: Damita Dunnings D on: 06/28/2016 10:30 AM   Modules accepted: Orders

## 2016-07-11 ENCOUNTER — Ambulatory Visit: Payer: BLUE CROSS/BLUE SHIELD | Admitting: Physical Medicine & Rehabilitation

## 2016-07-11 ENCOUNTER — Encounter: Payer: BLUE CROSS/BLUE SHIELD | Attending: Physical Medicine & Rehabilitation

## 2016-07-11 DIAGNOSIS — M25569 Pain in unspecified knee: Secondary | ICD-10-CM | POA: Insufficient documentation

## 2016-07-11 DIAGNOSIS — Z8489 Family history of other specified conditions: Secondary | ICD-10-CM | POA: Insufficient documentation

## 2016-07-11 DIAGNOSIS — E119 Type 2 diabetes mellitus without complications: Secondary | ICD-10-CM | POA: Insufficient documentation

## 2016-07-11 DIAGNOSIS — M5127 Other intervertebral disc displacement, lumbosacral region: Secondary | ICD-10-CM | POA: Insufficient documentation

## 2016-07-11 DIAGNOSIS — K219 Gastro-esophageal reflux disease without esophagitis: Secondary | ICD-10-CM | POA: Insufficient documentation

## 2016-07-11 DIAGNOSIS — Z803 Family history of malignant neoplasm of breast: Secondary | ICD-10-CM | POA: Insufficient documentation

## 2016-07-11 DIAGNOSIS — I10 Essential (primary) hypertension: Secondary | ICD-10-CM | POA: Insufficient documentation

## 2016-07-11 DIAGNOSIS — E669 Obesity, unspecified: Secondary | ICD-10-CM | POA: Insufficient documentation

## 2016-07-11 DIAGNOSIS — Z800 Family history of malignant neoplasm of digestive organs: Secondary | ICD-10-CM | POA: Insufficient documentation

## 2016-07-11 DIAGNOSIS — Z8249 Family history of ischemic heart disease and other diseases of the circulatory system: Secondary | ICD-10-CM | POA: Insufficient documentation

## 2016-07-11 DIAGNOSIS — M961 Postlaminectomy syndrome, not elsewhere classified: Secondary | ICD-10-CM | POA: Insufficient documentation

## 2016-07-11 DIAGNOSIS — D571 Sickle-cell disease without crisis: Secondary | ICD-10-CM | POA: Insufficient documentation

## 2016-07-11 DIAGNOSIS — G894 Chronic pain syndrome: Secondary | ICD-10-CM | POA: Insufficient documentation

## 2016-08-01 ENCOUNTER — Ambulatory Visit (HOSPITAL_BASED_OUTPATIENT_CLINIC_OR_DEPARTMENT_OTHER)
Admission: RE | Admit: 2016-08-01 | Discharge: 2016-08-01 | Disposition: A | Discharge status: Home / Self Care | Payer: BLUE CROSS/BLUE SHIELD | Source: Ambulatory Visit | Attending: Internal Medicine | Admitting: Internal Medicine

## 2016-08-01 ENCOUNTER — Ambulatory Visit (INDEPENDENT_AMBULATORY_CARE_PROVIDER_SITE_OTHER): Payer: BLUE CROSS/BLUE SHIELD | Admitting: Internal Medicine

## 2016-08-01 ENCOUNTER — Encounter: Payer: Self-pay | Admitting: Internal Medicine

## 2016-08-01 VITALS — BP 132/78 | HR 78 | Temp 98.2°F | Resp 14 | Ht 65.0 in | Wt 296.1 lb

## 2016-08-01 DIAGNOSIS — R079 Chest pain, unspecified: Secondary | ICD-10-CM

## 2016-08-01 DIAGNOSIS — R197 Diarrhea, unspecified: Secondary | ICD-10-CM

## 2016-08-01 NOTE — Patient Instructions (Signed)
Please get your chest x-ray downstairs  IBUPROFEN (Advil or Motrin) 200 mg 2 tablets every 6 hours as needed for pain.  Always take it with food because may cause gastritis and ulcers.  If you notice nausea, stomach pain, change in the color of stools --->  Stop the medicine and let us know   For diarrhea, take OTC Imodium. If the diarrhea gets worse or is not improving in the next 2 or 3 days let us know

## 2016-08-01 NOTE — Progress Notes (Signed)
Subjective:    Patient ID: [name], female    DOB: [birth year], 51 y.o.   MRN: [id number]  DOS:  08/01/2016 Type of visit - description : acute, here w/ her husband Interval history: The patient made appointment today for chest pain, she also had diarrhea for the last 12 hours. Pain is located at the anterior  right chest, mild but increases with a deep breading and cough. She does not recall any injury, has not noted any rash. pain is not worse by eating. Reports that she had similar symptoms back in December, they self resolved.  Diarrhea started last night, watery, no blood in the stools, no associated with stomach pain.   Review of Systems Denies any URI type of symptoms, she does have occasional allergies at baseline. Fever a few days ago?Marland Kitchen No difficulty breathing or lower extremity edema No calf pain or swelling No recent airplane trip  or prolonged car trip No breast tenderness per se.  Past Medical History:  Diagnosis Date  . Anxiety state, unspecified 05/16/2013  . Arthritis    "right knee" (08/27/2013)  . Asthma    "only when I get a sinus infection which is 1-2 X/yr" (08/27/2013)  . Chronic pain syndrome   . Diabetes ([place])    "borderline" (08/27/2013)  . Failed back syndrome, cervical   . GERD (gastroesophageal reflux disease)   . Hypertension    no meds  . Lumbar radiculopathy, chronic   . Migraine    "once or twice/month now" (08/27/2013)  . Neck pain    axial, s/p cervical fusion C5-6, C6-7   . Obesity   . Sickle cell trait ([place])   . Wears glasses     Past Surgical History:  Procedure Laterality Date  . ANTERIOR CERVICAL DECOMP/DISCECTOMY FUSION  08/27/2013  . ANTERIOR CERVICAL DECOMP/DISCECTOMY FUSION N/A 08/27/2013   Procedure: ANTERIOR CERVICAL /DISCECTOMY FUSION (ACDF) C5-C7  2 LEVELS;  Surgeon: Melina Schools, MD;  Location: [place];  Service: Orthopedics;  Laterality: N/A;  . BREAST REDUCTION SURGERY Bilateral 09/28/2014   Procedure: MAMMARY  REDUCTION  (BREAST) BILATERAL;  Surgeon: Cristine Polio, MD;  Location: [facility];  Service: Plastics;  Laterality: Bilateral;  . BREAST SURGERY    . CHOLECYSTECTOMY  06/1989  . DILATION AND CURETTAGE OF UTERUS    . HERNIA REPAIR    . KNEE ARTHROSCOPY Left 08/2003  . LAPAROSCOPIC BILATERAL SALPINGECTOMY Bilateral 01/22/2015   Procedure: LAPAROSCOPIC BILATERAL SALPINGECTOMY;  Surgeon: Azucena Fallen, MD;  Location: [facility];  Service: Gynecology;  Laterality: Bilateral;  . LEFT HEART CATHETERIZATION WITH CORONARY ANGIOGRAM N/A 04/15/2013   Procedure: LEFT HEART CATHETERIZATION WITH CORONARY ANGIOGRAM;  Surgeon: Peter M Martinique, MD;  Location: [facility] C[facility];  Service: Cardiovascular;  Laterality: N/A;  . LIPOSUCTION Bilateral 09/28/2014   Procedure: LIPOSUCTION;  Surgeon: Cristine Polio, MD;  Location: [facility];  Service: Plastics;  Laterality: Bilateral;  . ROBOTIC ASSISTED TOTAL HYSTERECTOMY N/A 01/22/2015   has her ovaries---ROBOTIC ASSISTED TOTAL HYSTERECTOMY With Pelvic Washings, Lysis of Adhesions;  Surgeon: Azucena Fallen, MD;  Location: [facility];  Service: Gynecology;  Laterality: N/A;  . SHOULDER ARTHROSCOPY W/ ROTATOR CUFF REPAIR Right 2006, 2007  . TUBAL LIGATION  1991  . UMBILICAL HERNIA REPAIR  2001, 2003  . [place] EXTRACTION  1985    Social History   Social History  . Marital status: Divorced    Spouse name: N/A  . Number of children: 2  . Years of education:  N/A   Occupational History  . 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 Smoking status: Never Smoker  . Smokeless tobacco: Never Used  . Alcohol use No  . Drug use: No  . Sexual activity: Not Currently    Birth control/ protection: None   Other Topics Concern  . Not on file   Social History Narrative   Lives in [place] by herself           Allergies as of 08/01/2016      Reactions   Codeine    Facial rash , lip swelling    Vicodin  [hydrocodone-acetaminophen] Swelling, Rash   Pt is able to take percocet without problems      Medication List       Accurate as of 08/01/16  7:11 PM. Always use your most recent med list.          atorvastatin 20 MG tablet Commonly known as:  LIPITOR Take 1 tablet (20 mg total) by mouth at bedtime.   baclofen 10 MG tablet Commonly known as:  LIORESAL Take 1 tablet as needed at onset of migraine.   budesonide-formoterol 160-4.5 MCG/ACT inhaler Commonly known as:  SYMBICORT Inhale 2 puffs into the lungs 2 (two) times daily.   canagliflozin 300 MG Tabs tablet Commonly known as:  INVOKANA Take 300 mg by mouth daily before breakfast.   fexofenadine 180 MG tablet Commonly known as:  ALLEGRA Take 180 mg by mouth daily.   folic acid 1 MG tablet Commonly known as:  FOLVITE Take 1 mg by mouth daily.   gabapentin 300 MG capsule Commonly known as:  NEURONTIN Take 1 capsule at night   ibuprofen 200 MG tablet Commonly known as:  ADVIL Take 3 tablets (600 mg total) by mouth every 6 (six) hours as needed.   metFORMIN 1000 MG tablet Commonly known as:  GLUCOPHAGE Take 1.5 tablets by mouth every morning and 1 tablet by mouth every evening   topiramate 50 MG tablet Commonly known as:  TOPAMAX Take 1 tablet in AM, 2 tablets in PM   vitamin B-12 500 MCG tablet Commonly known as:  CYANOCOBALAMIN Take 1,000 mcg by mouth daily.   vitamin C 100 MG tablet Take 100 mg by mouth 2 (two) times daily.          Objective:   Physical Exam  Pulmonary/Chest:     BP 132/78 (BP Location: Left Arm, Patient Position: Sitting, Cuff Size: Normal)   Pulse 78   Temp 98.2 F (36.8 C) (Oral)   Resp 14   Ht 5\' 5"  (1.651 m)   Wt 296 lb 2 oz (134.3 kg)   LMP 01/16/2015   SpO2 98%   BMI 49.28 kg/m  General:   Well developed, well nourished . NAD.  HEENT:  Normocephalic . Face symmetric, atraumatic Lungs:  CTA B Normal respiratory effort, no intercostal retractions, no accessory  muscle use. Chest wall: See graphic Heart: RRR,  no murmur.  no pretibial edema bilaterally  Abdomen:  Not distended, soft, non-tender. No rebound or rigidity.   Skin: Not pale. Not jaundice Neurologic:  alert & oriented X3.  Speech normal, gait appropriate for age and unassisted Psych--  Cognition and judgment appear intact.  Cooperative with normal attention span and concentration.  Behavior appropriate. No anxious or depressed appearing.     Assessment & Plan:   Assessment  DM, no neuropathy Dyslipidemia Elevated BP GERD Asthma-- associated with URIs Anxiety MSK: --  DJD. Multiple surgeries.  ---Chronic back pain, local injections back Dr. Ella Bodo --on tramadol, robaxin, Valium pre-procedures ---Neck pain-- Dr Rolena Infante, Dr Nelva Bush Migraine headaches -- on topamax, baclofen, per Dr Delice Lesch Sickle cell trait CP: cardiac cath 2014 -- normal  PLAN: R sided chest pain: Sx started 2 weeks ago,  ROS (-) for red flags, exam points to a MSK etiology. Will get a chest x-ray, rec NSAIDs, if she is not better she will let me know, may need further eval.   Diarrhea: Started last night, abdominal exam benign, would recommend Imodium and let me know if no better.

## 2016-08-01 NOTE — Assessment & Plan Note (Signed)
R sided chest pain: Sx started 2 weeks ago,  ROS (-) for red flags, exam points to a MSK etiology. Will get a chest x-ray, rec NSAIDs, if she is not better she will let me know, may need further eval.   Diarrhea: Started last night, abdominal exam benign, would recommend Imodium and let me know if no better.

## 2016-08-01 NOTE — Progress Notes (Signed)
Pre visit review using our clinic review tool, if applicable. No additional management support is needed unless otherwise documented below in the visit note. 

## 2016-08-31 ENCOUNTER — Ambulatory Visit: Payer: BLUE CROSS/BLUE SHIELD | Admitting: Internal Medicine

## 2016-09-05 ENCOUNTER — Telehealth: Payer: Self-pay

## 2016-09-05 NOTE — Telephone Encounter (Signed)
-----   Message from Colon Branch, MD sent at 09/03/2016 12:46 PM EST ----- Regarding: send message Please send the patient a message through [place] reminding her that I recommend Lipitor, if she is taking it needs a FLP, AST, ALT

## 2016-09-05 NOTE — Telephone Encounter (Signed)
My Chart message sent

## 2016-09-20 ENCOUNTER — Ambulatory Visit (INDEPENDENT_AMBULATORY_CARE_PROVIDER_SITE_OTHER): Payer: Self-pay

## 2016-09-20 ENCOUNTER — Ambulatory Visit (HOSPITAL_COMMUNITY)
Admission: EM | Admit: 2016-09-20 | Discharge: 2016-09-20 | Disposition: A | Discharge status: Home / Self Care | Payer: Self-pay | Attending: Internal Medicine | Admitting: Internal Medicine

## 2016-09-20 ENCOUNTER — Encounter (HOSPITAL_COMMUNITY): Payer: Self-pay | Admitting: *Deleted

## 2016-09-20 DIAGNOSIS — M25561 Pain in right knee: Secondary | ICD-10-CM

## 2016-09-20 MED ORDER — IBUPROFEN 800 MG PO TABS: 800.0000 mg | ORAL_TABLET | Freq: Three times a day (TID) | ORAL | 0 refills | Status: DC

## 2016-09-20 NOTE — ED Notes (Signed)
Patient discharged out of department

## 2016-09-20 NOTE — ED Notes (Signed)
While reviewing discharge instructions, patient reported knee sleeve rolling down on leg.

## 2016-09-20 NOTE — Discharge Instructions (Signed)
I do not see any fractures or dislocations on x-ray of your knee. I did see evidence of arthritis within your knee. We have placed your knee in a knee sleeve, and a prescription for ibuprofen 800 mg has been sent to your pharmacy. Should the radiologist have any abnormal findings beyond what I saw on the film, you will be notified by telephone and given instructions as what to do. I advise rest, elevation, apply compression to the knee with either a knee sleeve or Ace bandages, ibuprofen for pain, and you may also take Tylenol over-the-counter as well. Should your pain persist, follow-up with an orthopedist or your primary care provider

## 2016-09-20 NOTE — ED Provider Notes (Signed)
CSN: [id number]     Arrival date & time 09/20/16  1000 History   First MD Initiated Contact with Patient 09/20/16 1024     Chief Complaint  Patient presents with  . Fall   (Consider location/radiation/quality/duration/timing/severity/associated sxs/prior Treatment) 51 year old female presents to the clinic for evaluation following a fall. She states she was walking to a class, when she slipped and fell on some ice landing on her right side. She did not hit her head, no loss of consciousness, no weakness, no dizziness, no nausea, no vomiting, she does not take any blood thinners. She has pain in her hip, and she has pain in her right knee. She has no loss of function, she was able to bear weight shortly afterwards, however she is has pain with walking. Pulse motor and sensory function remained intact distal to the injury. She has no known history of any bone disorders, and no recent steroid use.   The history is provided by the patient.    Past Medical History:  Diagnosis Date  . Anxiety state, unspecified 05/16/2013  . Arthritis    "right knee" (08/27/2013)  . Asthma    "only when I get a sinus infection which is 1-2 X/yr" (08/27/2013)  . Chronic pain syndrome   . Diabetes ([place])    "borderline" (08/27/2013)  . Failed back syndrome, cervical   . GERD (gastroesophageal reflux disease)   . Hypertension    no meds  . Lumbar radiculopathy, chronic   . Migraine    "once or twice/month now" (08/27/2013)  . Neck pain    axial, s/p cervical fusion C5-6, C6-7   . Obesity   . Sickle cell trait ([place])   . Wears glasses    Past Surgical History:  Procedure Laterality Date  . ANTERIOR CERVICAL DECOMP/DISCECTOMY FUSION  08/27/2013  . ANTERIOR CERVICAL DECOMP/DISCECTOMY FUSION N/A 08/27/2013   Procedure: ANTERIOR CERVICAL /DISCECTOMY FUSION (ACDF) C5-C7  2 LEVELS;  Surgeon: Melina Schools, MD;  Location: [facility];  Service: Orthopedics;  Laterality: N/A;  . BREAST REDUCTION SURGERY Bilateral 09/28/2014    Procedure: MAMMARY REDUCTION  (BREAST) BILATERAL;  Surgeon: Cristine Polio, MD;  Location: [facility];  Service: Plastics;  Laterality: Bilateral;  . BREAST SURGERY    . CHOLECYSTECTOMY  06/1989  . DILATION AND CURETTAGE OF UTERUS    . HERNIA REPAIR    . KNEE ARTHROSCOPY Left 08/2003  . LAPAROSCOPIC BILATERAL SALPINGECTOMY Bilateral 01/22/2015   Procedure: LAPAROSCOPIC BILATERAL SALPINGECTOMY;  Surgeon: Azucena Fallen, MD;  Location: [facility];  Service: Gynecology;  Laterality: Bilateral;  . LEFT HEART CATHETERIZATION WITH CORONARY ANGIOGRAM N/A 04/15/2013   Procedure: LEFT HEART CATHETERIZATION WITH CORONARY ANGIOGRAM;  Surgeon: Peter M Martinique, MD;  Location: [facility];  Service: Cardiovascular;  Laterality: N/A;  . LIPOSUCTION Bilateral 09/28/2014   Procedure: LIPOSUCTION;  Surgeon: Cristine Polio, MD;  Location: [place] [facility];  Service: Plastics;  Laterality: Bilateral;  . ROBOTIC ASSISTED TOTAL HYSTERECTOMY N/A 01/22/2015   has her ovaries---ROBOTIC ASSISTED TOTAL HYSTERECTOMY With Pelvic Washings, Lysis of Adhesions;  Surgeon: Azucena Fallen, MD;  Location: [facility];  Service: Gynecology;  Laterality: N/A;  . SHOULDER ARTHROSCOPY W/ ROTATOR CUFF REPAIR Right 2006, 2007  . TUBAL LIGATION  1991  . UMBILICAL HERNIA REPAIR  2001, 2003  . WISDOM TOOTH EXTRACTION  1985   Family History  Problem Relation Age of Onset  . Breast cancer Other      GM  . Diabetes Father  F, B, S  . CAD Father     died of MI @ 2  . Kidney disease Father   . Pancreatic cancer Other     GM  . CAD Sister     MI at age 86  . CAD Brother     CHF, died at age 29  . Heart attack Brother     died suddenly @ 28  . Leukemia Sister   . Colon cancer Neg Hx    Social History  Substance Use Topics  . Smoking status: Never Smoker  . Smokeless tobacco: Never Used  . Alcohol use No   OB History    No data available     Review of Systems  Reason unable to perform ROS: as covered  in HPI.  All other systems reviewed and are negative.   Allergies  Codeine and Vicodin [hydrocodone-acetaminophen]  Home Medications   Prior to Admission medications   Medication Sig Start Date End Date Taking? Authorizing Provider  Ascorbic Acid (VITAMIN C) 100 MG tablet Take 100 mg by mouth 2 (two) times daily.     Historical Provider, MD  atorvastatin (LIPITOR) 20 MG tablet Take 1 tablet (20 mg total) by mouth at bedtime. 06/28/16   Colon Branch, MD  baclofen (LIORESAL) 10 MG tablet Take 1 tablet as needed at onset of migraine. 08/23/15   Cameron Sprang, MD  budesonide-formoterol [facility]) 160-4.5 MCG/ACT inhaler Inhale 2 puffs into the lungs 2 (two) times daily. Patient taking differently: Inhale 2 puffs into the lungs 2 (two) times daily as needed (shortness of breath).  04/16/15   Colon Branch, MD  canagliflozin [facility]) 300 MG TABS tablet Take 300 mg by mouth daily before breakfast. 11/18/14   Colon Branch, MD  fexofenadine (ALLEGRA) 180 MG tablet Take 180 mg by mouth daily.    Historical Provider, MD  folic acid (FOLVITE) 1 MG tablet Take 1 mg by mouth daily.    Historical Provider, MD  gabapentin (NEURONTIN) 300 MG capsule Take 1 capsule at night Patient not taking: Reported on 06/26/2016 08/23/15   Cameron Sprang, MD  ibuprofen (ADVIL,MOTRIN) 800 MG tablet Take 1 tablet (800 mg total) by mouth 3 (three) times daily. 09/20/16   Barnet Glasgow, NP  metFORMIN (GLUCOPHAGE) 1000 MG tablet Take 1.5 tablets by mouth every morning and 1 tablet by mouth every evening 06/28/16   Colon Branch, MD  topiramate (TOPAMAX) 50 MG tablet Take 1 tablet in AM, 2 tablets in PM 08/23/15   Cameron Sprang, MD  vitamin B-12 (CYANOCOBALAMIN) 500 MCG tablet Take 1,000 mcg by mouth daily.     Historical Provider, MD   Meds Ordered and Administered this Visit  Medications - No data to display  BP 170/100 (BP Location: Right Arm)   Pulse 72   Temp 98.6 F (37 C) (Oral)   Resp 18   LMP 01/16/2015   SpO2 100%   No data found.   Physical Exam  Constitutional: She is oriented to person, place, and time. She appears well-developed and well-nourished. No distress.  HENT:  Head: Normocephalic and atraumatic.  Right Ear: External ear normal.  Left Ear: External ear normal.  Neck: Normal range of motion. No JVD present.  Musculoskeletal:       Right hip: She exhibits tenderness. She exhibits normal range of motion, normal strength, no swelling, no crepitus, no deformity and no laceration.       Right knee: She exhibits swelling. She  exhibits normal range of motion, no deformity, no LCL laxity, normal patellar mobility, no bony tenderness and no MCL laxity. Tenderness found. Lateral joint line and LCL tenderness noted. No patellar tendon tenderness noted.  Lymphadenopathy:    She has no cervical adenopathy.  Neurological: She is alert and oriented to person, place, and time.  Skin: Skin is warm and dry. Capillary refill takes less than 2 seconds. She is not diaphoretic.  Psychiatric: She has a normal mood and affect. Her behavior is normal.  Nursing note and vitals reviewed.   Urgent Care Course     Procedures (including critical care time)  Labs Review Labs Reviewed - No data to display  Imaging Review Dg Knee Complete 4 Views Right  Result Date: 09/20/2016 CLINICAL DATA:  Slipped and fell on ice.  Pain. EXAM: RIGHT KNEE - COMPLETE 4+ VIEW COMPARISON:  None. FINDINGS: No acute fracture dislocation. Mild medial and lateral femorotibial compartment osteoarthritis of the right knee. Severe patellofemoral compartment osteoarthritis of the right knee. No significant joint effusion. Small loose body in the superior medial right knee joint. IMPRESSION: No acute osseous injury of the right knee. Electronically Signed   By: Kathreen Devoid   On: 09/20/2016 11:41        MDM   1. Acute pain of right knee    X-ray no significant evidence of fracture or dislocation. Knee was wrapped in clinic, patient  was given a prescription for ibuprofen for pain, also advised to take over-the-counter Tylenol if she needed additional pain control. Follow-up with orthopedics if pain persists.     Barnet Glasgow, NP 09/20/16 1154

## 2016-09-20 NOTE — ED Notes (Signed)
Was notified this patient could now be removed from chart rack

## 2016-09-20 NOTE — ED Triage Notes (Signed)
Pt  Reports   She  Fell  toay  inj  Injured her r  Side     From  Hip down   She reports  As   Well  That  The  Pain is   Worse  When  She  Bears  Weight on the  Affected area       She  Ambulated  With a  Slow  Gait   She is  Awake  And  Alert  As  Well as  Oriented      Her  Skin is  Warm  And  Dry

## 2016-09-20 NOTE — ED Notes (Signed)
Verified discharge instructions

## 2016-11-06 ENCOUNTER — Ambulatory Visit (INDEPENDENT_AMBULATORY_CARE_PROVIDER_SITE_OTHER): Payer: Self-pay | Admitting: Internal Medicine

## 2016-11-06 ENCOUNTER — Encounter: Payer: Self-pay | Admitting: Internal Medicine

## 2016-11-06 VITALS — BP 134/88 | HR 84 | Temp 97.4°F | Resp 14 | Ht 65.0 in | Wt 305.1 lb

## 2016-11-06 DIAGNOSIS — J45901 Unspecified asthma with (acute) exacerbation: Secondary | ICD-10-CM

## 2016-11-06 MED ORDER — PREDNISONE 10 MG PO TABS: ORAL_TABLET | ORAL | 0 refills | Status: DC

## 2016-11-06 MED ORDER — AZITHROMYCIN 250 MG PO TABS: ORAL_TABLET | ORAL | 0 refills | Status: DC

## 2016-11-06 MED ORDER — ALBUTEROL SULFATE HFA 108 (90 BASE) MCG/ACT IN AERS: 2.0000 | INHALATION_SPRAY | Freq: Four times a day (QID) | RESPIRATORY_TRACT | 1 refills | Status: DC | PRN

## 2016-11-06 NOTE — Progress Notes (Signed)
Subjective:    Patient ID: [name], female    DOB: 1966/01/06, 51 y.o.   MRN: [id number]  DOS:  11/06/2016 Type of visit - description : acute Interval history: Symptoms started 2 weeks ago with "allergy" sx  including runny nose, itchy eyes, itchy nose. She later on developed hoarseness, cough and wheezing. Cough is sometimes quite persistent and she can't stop it. She is developing yellow greenish nasal discharge and sputum. restarted Symbicort which she was not taking regularly.   Review of Systems Denies fever or chills + Chest pain with cough only Some difficulty breathing with cough only. No nausea, vomiting, diarrhea CBGs 128 the last time she checked 2 days ago.  Past Medical History:  Diagnosis Date  . Anxiety state, unspecified 05/16/2013  . Arthritis    "right knee" (08/27/2013)  . Asthma    "only when I get a sinus infection which is 1-2 X/yr" (08/27/2013)  . Chronic pain syndrome   . Diabetes ([place])    "borderline" (08/27/2013)  . Failed back syndrome, cervical   . GERD (gastroesophageal reflux disease)   . Hypertension    no meds  . Lumbar radiculopathy, chronic   . Migraine    "once or twice/month now" (08/27/2013)  . Neck pain    axial, s/p cervical fusion C5-6, C6-7   . Obesity   . Sickle cell trait ([place])   . Wears glasses     Past Surgical History:  Procedure Laterality Date  . ANTERIOR CERVICAL DECOMP/DISCECTOMY FUSION  08/27/2013  . ANTERIOR CERVICAL DECOMP/DISCECTOMY FUSION N/A 08/27/2013   Procedure: ANTERIOR CERVICAL /DISCECTOMY FUSION (ACDF) C5-C7  2 LEVELS;  Surgeon: Melina Schools, MD;  Location: [place];  Service: Orthopedics;  Laterality: N/A;  . BREAST REDUCTION SURGERY Bilateral 09/28/2014   Procedure: MAMMARY REDUCTION  (BREAST) BILATERAL;  Surgeon: Cristine Polio, MD;  Location: [place];  Service: Plastics;  Laterality: Bilateral;  . BREAST SURGERY    . CHOLECYSTECTOMY  06/1989  . DILATION AND CURETTAGE OF UTERUS      . HERNIA REPAIR    . KNEE ARTHROSCOPY Left 08/2003  . LAPAROSCOPIC BILATERAL SALPINGECTOMY Bilateral 01/22/2015   Procedure: LAPAROSCOPIC BILATERAL SALPINGECTOMY;  Surgeon: Azucena Fallen, MD;  Location: [facility];  Service: Gynecology;  Laterality: Bilateral;  . LEFT HEART CATHETERIZATION WITH CORONARY ANGIOGRAM N/A 04/15/2013   Procedure: LEFT HEART CATHETERIZATION WITH CORONARY ANGIOGRAM;  Surgeon: Peter M Martinique, MD;  Location: [facility] CATH LAB;  Service: Cardiovascular;  Laterality: N/A;  . LIPOSUCTION Bilateral 09/28/2014   Procedure: LIPOSUCTION;  Surgeon: Cristine Polio, MD;  Location: [place];  Service: Plastics;  Laterality: Bilateral;  . ROBOTIC ASSISTED TOTAL HYSTERECTOMY N/A 01/22/2015   has her ovaries---ROBOTIC ASSISTED TOTAL HYSTERECTOMY With Pelvic Washings, Lysis of Adhesions;  Surgeon: Azucena Fallen, MD;  Location: [facility];  Service: Gynecology;  Laterality: N/A;  . SHOULDER ARTHROSCOPY W/ ROTATOR CUFF REPAIR Right 2006, 2007  . TUBAL LIGATION  1991  . UMBILICAL HERNIA REPAIR  2001, 2003  . [place] EXTRACTION  1985    Social History   Social History  . Marital status: Divorced    Spouse name: N/A  . Number of children: 2  . Years of education: N/A   Occupational History  . 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 Smoking status: Never Smoker  . Smokeless tobacco: Never Used  . Alcohol use No  . Drug use: No  . Sexual activity: Not  Currently    Birth control/ protection: None   Other Topics Concern  . Not on file   Social History Narrative   Lives in [place] by herself           Allergies as of 11/06/2016      Reactions   Codeine    Facial rash , lip swelling    Vicodin [hydrocodone-acetaminophen] Swelling, Rash   Pt is able to take percocet without problems      Medication List       Accurate as of 11/06/16 11:59 PM. Always use your most recent med list.          albuterol 108 (90 Base) MCG/ACT  inhaler Commonly known as:  VENTOLIN HFA Inhale 2 puffs into the lungs every 6 (six) hours as needed for wheezing or shortness of breath.   atorvastatin 20 MG tablet Commonly known as:  LIPITOR Take 1 tablet (20 mg total) by mouth at bedtime.   azithromycin 250 MG tablet Commonly known as:  ZITHROMAX Z-PAK 2 tabs a day the first day, then 1 tab a day x 4 days   baclofen 10 MG tablet Commonly known as:  LIORESAL Take 1 tablet as needed at onset of migraine.   budesonide-formoterol 160-4.5 MCG/ACT inhaler Commonly known as:  SYMBICORT Inhale 2 puffs into the lungs 2 (two) times daily.   canagliflozin 300 MG Tabs tablet Commonly known as:  INVOKANA Take 300 mg by mouth daily before breakfast.   fexofenadine 180 MG tablet Commonly known as:  ALLEGRA Take 180 mg by mouth daily.   folic acid 1 MG tablet Commonly known as:  FOLVITE Take 1 mg by mouth daily.   ibuprofen 800 MG tablet Commonly known as:  ADVIL,MOTRIN Take 1 tablet (800 mg total) by mouth 3 (three) times daily.   metFORMIN 1000 MG tablet Commonly known as:  GLUCOPHAGE Take 1.5 tablets by mouth every morning and 1 tablet by mouth every evening   predniSONE 10 MG tablet Commonly known as:  DELTASONE 4 tablets x 2 days, 3 tabs x 2 days, 2 tabs x 2 days, 1 tab x 2 days   topiramate 50 MG tablet Commonly known as:  TOPAMAX Take 1 tablet in AM, 2 tablets in PM   vitamin B-12 500 MCG tablet Commonly known as:  CYANOCOBALAMIN Take 1,000 mcg by mouth daily.   vitamin C 100 MG tablet Take 100 mg by mouth 2 (two) times daily.          Objective:   Physical Exam BP 134/88 (BP Location: Left Arm, Patient Position: Sitting, Cuff Size: Normal)   Pulse 84   Temp 97.4 F (36.3 C) (Oral)   Resp 14   Ht 5\' 5"  (1.651 m)   Wt (!) 305 lb 2 oz (138.4 kg)   LMP 01/16/2015   SpO2 98%   BMI 50.78 kg/m  General:   Well developed, well nourished . NAD.  HEENT:  Normocephalic . Face symmetric, atraumatic. Nose  congested, sinuses: TTP bilaterally and the maxillary and frontal area. TMs normal, so symmetric and normal rate Lungs:  Very few rhonchi, + expiratory wheezing, mild, bilateral. No increased work of breathing Normal respiratory effort, no intercostal retractions, no accessory muscle use. Heart: RRR,  no murmur.  No pretibial edema bilaterally  Skin: Not pale. Not jaundice Neurologic:  alert & oriented X3.  Speech normal, gait appropriate for age and unassisted Psych--  Cognition and judgment appear intact.  Cooperative with normal attention span and  concentration.  Behavior appropriate. No anxious or depressed appearing.      Assessment & Plan:   Assessment  DM, no neuropathy Dyslipidemia Elevated BP GERD Asthma-- associated with URIs Anxiety MSK: --DJD. Multiple surgeries.  ---Chronic back pain, local injections back Dr. Ella Bodo --on tramadol, robaxin, Valium pre-procedures ---Neck pain-- Dr Rolena Infante, Dr Nelva Bush Migraine headaches -- on topamax, baclofen, per Dr Delice Lesch Sickle cell trait CP: cardiac cath 2014 -- normal  PLAN: Asthma exacerbation due to bronchitis and/or allergies. Recommend prednisone, watch CBGs closely. Also Symbicort consistently twice a day and albuterol as needed, prescription sent. Start a  zpack   D/c decongestant, call if not better, see instruction.  f/u few days as already scheduled

## 2016-11-06 NOTE — Patient Instructions (Addendum)
Rest, fluids , tylenol  For cough:  Take Mucinex DM twice a day as needed until better  For nasal congestion: Use OTC Nasocort or Flonase : 2 nasal sprays on each side of the nose in the morning until you feel better   For asthma: Symbicort twice a day every day Albuterol 2 puffs every 6 hours only as a rescue inhaler  Avoid decongestants such as  Pseudoephedrine or phenylephrine . Change Allegra-D to plain Allegra   Take prednisone as prescribed for a few days. It will raise her blood sugar. Check your blood sugar daily, be sure they are now going over 200.  Take the antibiotic as prescribed  (Zithromax)  Call if not gradually better over the next  10 days  Call anytime if the symptoms are severe

## 2016-11-06 NOTE — Progress Notes (Signed)
Pre visit review using our clinic review tool, if applicable. No additional management support is needed unless otherwise documented below in the visit note. 

## 2016-11-08 NOTE — Assessment & Plan Note (Addendum)
Asthma exacerbation due to bronchitis and/or allergies. Recommend prednisone, watch CBGs closely. Also Symbicort consistently twice a day and albuterol as needed, prescription sent. Start a  zpack   D/c decongestant, call if not better, see instruction.  f/u few days as already scheduled

## 2016-11-16 ENCOUNTER — Ambulatory Visit (INDEPENDENT_AMBULATORY_CARE_PROVIDER_SITE_OTHER): Payer: Self-pay | Admitting: Internal Medicine

## 2016-11-16 VITALS — BP 126/76 | HR 80 | Temp 98.4°F | Resp 14 | Ht 65.0 in | Wt 305.2 lb

## 2016-11-16 DIAGNOSIS — E785 Hyperlipidemia, unspecified: Secondary | ICD-10-CM

## 2016-11-16 DIAGNOSIS — J45909 Unspecified asthma, uncomplicated: Secondary | ICD-10-CM

## 2016-11-16 DIAGNOSIS — E119 Type 2 diabetes mellitus without complications: Secondary | ICD-10-CM

## 2016-11-16 LAB — HEMOGLOBIN A1C: Hgb A1c MFr Bld: 7.6 % — ABNORMAL HIGH (ref 4.6–6.5)

## 2016-11-16 LAB — MICROALBUMIN / CREATININE URINE RATIO
Creatinine,U: 84.6 mg/dL
Microalb Creat Ratio: 13.2 mg/g (ref 0.0–30.0)
Microalb, Ur: 11.2 mg/dL — ABNORMAL HIGH (ref 0.0–1.9)

## 2016-11-16 LAB — BASIC METABOLIC PANEL
BUN: 9 mg/dL (ref 6–23)
CO2: 27 mEq/L (ref 19–32)
Calcium: 9.3 mg/dL (ref 8.4–10.5)
Chloride: 104 mEq/L (ref 96–112)
Creatinine, Ser: 0.77 mg/dL (ref 0.40–1.20)
GFR: 101.96 mL/min (ref >60.00)
Glucose, Bld: 146 mg/dL — ABNORMAL HIGH (ref 70–99)
Potassium: 3.9 mEq/L (ref 3.5–5.1)
Sodium: 138 mEq/L (ref 135–145)

## 2016-11-16 LAB — LIPID PANEL
Cholesterol: 197 mg/dL (ref 0–200)
HDL: 61.3 mg/dL (ref >39.00)
LDL Cholesterol: 112 mg/dL — ABNORMAL HIGH (ref 0–99)
NonHDL: 136.06
Total CHOL/HDL Ratio: 3
Triglycerides: 118 mg/dL (ref 0.0–149.0)
VLDL: 23.6 mg/dL (ref 0.0–40.0)

## 2016-11-16 LAB — AST: AST: 13 U/L (ref 0–37)

## 2016-11-16 LAB — ALT: ALT: 11 U/L (ref 0–35)

## 2016-11-16 MED ORDER — NYSTATIN 100000 UNIT/ML MT SUSP: 5.0000 mL | Freq: Four times a day (QID) | OROMUCOSAL | 0 refills | Status: DC

## 2016-11-16 NOTE — Progress Notes (Signed)
Subjective:    Patient ID: [name], female    DOB: [birth year], 51 y.o.   MRN: [id number]  DOS:  11/16/2016 Type of visit - description : rov Interval history: DM: Metformin dose increased since the last visit, good compliance. She felt it helped. CBGs usually 127, 135 except when she took prednisone, they went up to the 180s Cholesterol: Started Lipitor, no apparent side effects Asthma: Previous visit, was treated with a Z-Pak and prednisone. Better but not completely well. Still having some chest congestion. After she finished Z-Pak she developed white patches in the mouth, she believed it was thrush, using coconut oil, a home remedy, seems to help except the tonge. Denies lip swelling.   Review of Systems Denies chest pain. Occasional peri-ankle edema  No nausea, vomiting, diarrhea. No blood in the stools  Past Medical History:  Diagnosis Date  . Anxiety state, unspecified 05/16/2013  . Arthritis    "right knee" (08/27/2013)  . Asthma    "only when I get a sinus infection which is 1-2 X/yr" (08/27/2013)  . Chronic pain syndrome   . Diabetes ([place])    "borderline" (08/27/2013)  . Failed back syndrome, cervical   . GERD (gastroesophageal reflux disease)   . Hypertension    no meds  . Lumbar radiculopathy, chronic   . Migraine    "once or twice/month now" (08/27/2013)  . Neck pain    axial, s/p cervical fusion C5-6, C6-7   . Obesity   . Sickle cell trait ([place])   . Wears glasses     Past Surgical History:  Procedure Laterality Date  . ANTERIOR CERVICAL DECOMP/DISCECTOMY FUSION N/A 08/27/2013   Procedure: ANTERIOR CERVICAL /DISCECTOMY FUSION (ACDF) C5-C7  2 LEVELS;  Surgeon: Melina Schools, MD;  Location: [place];  Service: Orthopedics;  Laterality: N/A;  . BREAST REDUCTION SURGERY Bilateral 09/28/2014   Procedure: MAMMARY REDUCTION  (BREAST) BILATERAL;  Surgeon: Cristine Polio, MD;  Location: [place] [facility];  Service: Plastics;  Laterality: Bilateral;  .  CHOLECYSTECTOMY  06/1989  . DILATION AND CURETTAGE OF UTERUS    . HERNIA REPAIR    . KNEE ARTHROSCOPY Left 08/2003  . LAPAROSCOPIC BILATERAL SALPINGECTOMY Bilateral 01/22/2015   Procedure: LAPAROSCOPIC BILATERAL SALPINGECTOMY;  Surgeon: Azucena Fallen, MD;  Location: [facility];  Service: Gynecology;  Laterality: Bilateral;  . LEFT HEART CATHETERIZATION WITH CORONARY ANGIOGRAM N/A 04/15/2013   Procedure: LEFT HEART CATHETERIZATION WITH CORONARY ANGIOGRAM;  Surgeon: Peter M Martinique, MD;  Location[facility] C[facility] LAB;  Service: Cardiovascular;  Laterality: N/A;  . LIPOSUCTION Bilateral 09/28/2014   Procedure: LIPOSUCTION;  Surgeon: Cristine Polio, MD;  Location: [place];  Service: Plastics;  Laterality: Bilateral;  . ROBOTIC ASSISTED TOTAL HYSTERECTOMY N/A 01/22/2015   has her ovaries---ROBOTIC ASSISTED TOTAL HYSTERECTOMY With Pelvic Washings, Lysis of Adhesions;  Surgeon: Azucena Fallen, MD;  Location: [facility];  Service: Gynecology;  Laterality: N/A;  . SHOULDER ARTHROSCOPY W/ ROTATOR CUFF REPAIR Right 2006, 2007  . TUBAL LIGATION  1991  . UMBILICAL HERNIA REPAIR  2001, 2003  . [place] EXTRACTION  1985    Social History   Social History  . Marital status: Divorced    Spouse name: N/A  . Number of children: 2  . Years of education: N/A   Occupational History  . 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 Smoking status: Never Smoker  . Smokeless tobacco: Never Used  . Alcohol use No  . Drug  use: No  . Sexual activity: Not Currently    Birth control/ protection: None   Other Topics Concern  . Not on file   Social History Narrative   Lives in [place] by herself           Allergies as of 11/16/2016      Reactions   Codeine    Facial rash , lip swelling    Vicodin [hydrocodone-acetaminophen] Swelling, Rash   Pt is able to take percocet without problems      Medication List       Accurate as of 11/16/16 11:59 PM. Always use your most recent  med list.          albuterol 108 (90 Base) MCG/ACT inhaler Commonly known as:  VENTOLIN HFA Inhale 2 puffs into the lungs every 6 (six) hours as needed for wheezing or shortness of breath.   atorvastatin 20 MG tablet Commonly known as:  LIPITOR Take 1 tablet (20 mg total) by mouth at bedtime.   baclofen 10 MG tablet Commonly known as:  LIORESAL Take 1 tablet as needed at onset of migraine.   budesonide-formoterol 160-4.5 MCG/ACT inhaler Commonly known as:  SYMBICORT Inhale 2 puffs into the lungs 2 (two) times daily.   canagliflozin 300 MG Tabs tablet Commonly known as:  INVOKANA Take 300 mg by mouth daily before breakfast.   fexofenadine 180 MG tablet Commonly known as:  ALLEGRA Take 180 mg by mouth daily.   folic acid 1 MG tablet Commonly known as:  FOLVITE Take 1 mg by mouth daily.   ibuprofen 800 MG tablet Commonly known as:  ADVIL,MOTRIN Take 1 tablet (800 mg total) by mouth 3 (three) times daily.   metFORMIN 1000 MG tablet Commonly known as:  GLUCOPHAGE Take 1.5 tablets by mouth every morning and 1 tablet by mouth every evening   nystatin 100000 UNIT/ML suspension Commonly known as:  MYCOSTATIN Take 5 mLs (500,000 Units total) by mouth 4 (four) times daily.   topiramate 50 MG tablet Commonly known as:  TOPAMAX Take 1 tablet in AM, 2 tablets in PM   vitamin B-12 500 MCG tablet Commonly known as:  CYANOCOBALAMIN Take 1,000 mcg by mouth daily.   vitamin C 100 MG tablet Take 100 mg by mouth 2 (two) times daily.          Objective:   Physical Exam BP 126/76 (BP Location: Left Wrist, Patient Position: Sitting, Cuff Size: Small)   Pulse 80   Temp 98.4 F (36.9 C) (Oral)   Resp 14   Ht 5\' 5"  (1.651 m)   Wt (!) 305 lb 4 oz (138.5 kg)   LMP 01/16/2015   SpO2 96%   BMI 50.80 kg/m  General:   Well developed, well nourished . NAD.  HEENT:  Normocephalic . Face symmetric, atraumatic. And the tongue he has a very small amount of white plaques. No  ulcers. Rest of today membranes are normal. Lips and tongue with no swelling Lungs:  Minimal rhonchi with cough. Currently with no wheezing. Normal respiratory effort, no intercostal retractions, no accessory muscle use. Heart: RRR,  no murmur.  No pretibial edema bilaterally  Skin: Not pale. Not jaundice Neurologic:  alert & oriented X3.  Speech normal, gait appropriate for age and unassisted Psych--  Cognition and judgment appear intact.  Cooperative with normal attention span and concentration.  Behavior appropriate. No anxious or depressed appearing.      Assessment & Plan:   Assessment  DM, no neuropathy Dyslipidemia  Elevated BP GERD Asthma-- associated with URIs-allergies  Anxiety MSK: --DJD. Multiple surgeries.  ---Chronic back pain, local injections back (  sees  Dr. Ella Bodo) ---Neck pain-- used to see  Dr Rolena Infante, Dr Nelva Bush now seen at [facility] Migraine headaches -- on topamax, baclofen, per Dr Delice Lesch Sickle cell trait CP: cardiac cath 2014 -- normal  PLAN: DM: Continue invokana, metformin dose based on the last A1c was a slightly increased. Check BMP and A1c. No recent formal dietary education, recommend to talk to the bariatric office, also self education. See instructions. High cholesterol: Started Lipitor few months ago, good compliance and tolerance, check a FLP, AST, ALT Asthma: Status post a Z-Pak and prednisone, improved but not completely well. Recommend to continue with Symbicort (rinse mouth very well after each use) and albuterol as needed. Also Flonase Thrush: Nystatin. RTC 3 months

## 2016-11-16 NOTE — Progress Notes (Signed)
Pre visit review using our clinic review tool, if applicable. No additional management support is needed unless otherwise documented below in the visit note. 

## 2016-11-16 NOTE — Patient Instructions (Addendum)
GO TO THE LAB : Get the blood work     GO TO THE FRONT DESK Schedule your next appointment for a  checkup in 3 months    If you need more information about a healthy diet visit: The American Heart Association, http://www.heart.[facility] website it is a great resource http://www.[place].org   For asthma allergies:  Symbicort twice a day every day until completely well, rinse your mouth after use. Albuterol as needed Flonase Call if no better in the next 2 or 3 weeks  Nystatin 4 times a day for 5 days for thrush

## 2016-11-17 NOTE — Assessment & Plan Note (Signed)
DM: Continue invokana, metformin dose based on the last A1c was a slightly increased. Check BMP and A1c. No recent formal dietary education, recommend to talk to the bariatric office, also self education. See instructions. High cholesterol: Started Lipitor few months ago, good compliance and tolerance, check a FLP, AST, ALT Asthma: Status post a Z-Pak and prednisone, improved but not completely well. Recommend to continue with Symbicort (rinse mouth very well after each use) and albuterol as needed. Also Flonase Thrush: Nystatin. RTC 3 months

## 2016-12-15 ENCOUNTER — Other Ambulatory Visit: Payer: Self-pay | Admitting: Internal Medicine

## 2016-12-15 ENCOUNTER — Other Ambulatory Visit: Payer: Self-pay

## 2016-12-15 MED ORDER — METFORMIN HCL 1000 MG PO TABS: ORAL_TABLET | ORAL | 5 refills | Status: DC

## 2017-02-13 ENCOUNTER — Encounter (HOSPITAL_COMMUNITY): Payer: Self-pay

## 2017-02-13 ENCOUNTER — Emergency Department (HOSPITAL_COMMUNITY)
Admission: EM | Admit: 2017-02-13 | Discharge: 2017-02-13 | Disposition: A | Discharge status: Home / Self Care | Payer: Self-pay | Attending: Emergency Medicine | Admitting: Emergency Medicine

## 2017-02-13 ENCOUNTER — Emergency Department (HOSPITAL_COMMUNITY): Payer: Self-pay

## 2017-02-13 DIAGNOSIS — J45909 Unspecified asthma, uncomplicated: Secondary | ICD-10-CM | POA: Insufficient documentation

## 2017-02-13 DIAGNOSIS — Z79899 Other long term (current) drug therapy: Secondary | ICD-10-CM | POA: Insufficient documentation

## 2017-02-13 DIAGNOSIS — D573 Sickle-cell trait: Secondary | ICD-10-CM | POA: Insufficient documentation

## 2017-02-13 DIAGNOSIS — I10 Essential (primary) hypertension: Secondary | ICD-10-CM | POA: Insufficient documentation

## 2017-02-13 DIAGNOSIS — E119 Type 2 diabetes mellitus without complications: Secondary | ICD-10-CM | POA: Insufficient documentation

## 2017-02-13 DIAGNOSIS — G43909 Migraine, unspecified, not intractable, without status migrainosus: Secondary | ICD-10-CM | POA: Insufficient documentation

## 2017-02-13 DIAGNOSIS — G43809 Other migraine, not intractable, without status migrainosus: Secondary | ICD-10-CM

## 2017-02-13 DIAGNOSIS — M25561 Pain in right knee: Secondary | ICD-10-CM | POA: Insufficient documentation

## 2017-02-13 LAB — CBG MONITORING, ED: Glucose-Capillary: 173 mg/dL — ABNORMAL HIGH (ref 65–99)

## 2017-02-13 MED ORDER — PROCHLORPERAZINE EDISYLATE 5 MG/ML IJ SOLN
10.0000 mg | Freq: Once | INTRAMUSCULAR | Status: AC
  Administered 2017-02-13: 10 mg via INTRAMUSCULAR
  Filled 2017-02-13: qty 2

## 2017-02-13 MED ORDER — KETOROLAC TROMETHAMINE 60 MG/2ML IM SOLN
60.0000 mg | Freq: Once | INTRAMUSCULAR | Status: AC
  Administered 2017-02-13: 60 mg via INTRAMUSCULAR
  Filled 2017-02-13: qty 2

## 2017-02-13 NOTE — ED Provider Notes (Signed)
[place] DEPT Provider Note   CSN: [id number] Arrival date & time: 02/13/17  0750     History   Chief Complaint No chief complaint on file.   HPI [name] is a 51 y.o. female.  The history is provided by the patient and medical records.    51 year old female with history of anxiety, arthritis, asthma, chronic pain, diabetes, GERD, hypertension, migraine headaches, obesity, presenting to the ED with headache and right knee pain.  1.  Headache-- states it began 3 days ago. Feels like her typical migraine. Reports it is mostly localized to the back of her head, throbbing and pulsatile in nature. She reports associated photophobia, nausea, and vomiting last night. Has been able to eat and drink small amounts since then. She denies any dizziness, confusion, tinnitus, changes in speech, numbness, weakness, or gait disturbance. Reports she takes gabapentin and Topamax for her migraine headaches and is followed by neurology. States initially her headaches controlled her symptoms that are, but have seemed to lose their efficacy recently.  2.  Right knee pain-- has been an intermittent issue since March of this year. She denies any new injury, trauma, or falls. States when she walks she feels that her knee pops and clicks and it buckles sometimes. States she is able to catch herself before she falls. She has not had any numbness or weakness in the right leg. She did have x-rays performed in March of this year for similar symptoms, but reports symptoms are worse now. States in the evening she feels that her legs swell up, this resolves when she elevates her legs to sleep at night.  States she does sit at a desk for long hours during the day but tries to get up and walk when she can.  She denies any calf pain. No history of DVT.  Past Medical History:  Diagnosis Date  . Anxiety state, unspecified 05/16/2013  . Arthritis    "right knee" (08/27/2013)  . Asthma    "only when I get a sinus  infection which is 1-2 X/yr" (08/27/2013)  . Chronic pain syndrome   . Diabetes ([place])    "borderline" (08/27/2013)  . Failed back syndrome, cervical   . GERD (gastroesophageal reflux disease)   . Hypertension    no meds  . Lumbar radiculopathy, chronic   . Migraine    "once or twice/month now" (08/27/2013)  . Neck pain    axial, s/p cervical fusion C5-6, C6-7   . Obesity   . Sickle cell trait ([place])   . Wears glasses     Patient Active Problem List   Diagnosis Date Noted  . Impingement syndrome of left shoulder 06/13/2016  . Sacroiliac dysfunction 03/28/2016  . Dyslipidemia 10/18/2015  . PCP NOTES >>>>>>>>>>> 04/01/2015  . S/P laparoscopic hysterectomy 01/22/2015  . Migraine with aura and without status migrainosus, not intractable 01/20/2015  . Chronic back pain 01/20/2015  . Chronic low back pain 01/18/2015  . Postlaminectomy syndrome, cervical region 01/18/2015  . Lumbar facet arthropathy ([place]) 01/18/2015  . Lumbar degenerative disc disease 01/18/2015  . Neck pain 08/27/2013  . Anxiety state 05/16/2013  . Cervical disc disorder with radiculopathy of cervical region 04/18/2013  . Morbid obesity ([place]) 04/15/2013  . Diabetes ([place]) 02/14/2013  . Mild anemia 02/14/2013  . HTN (hypertension) 12/12/2012  . Annual physical exam 12/12/2012  . 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 Migraines 12/12/2012  . GERD (gastroesophageal reflux disease)     Past Surgical History:  Procedure Laterality Date  .  ANTERIOR CERVICAL DECOMP/DISCECTOMY FUSION N/A 08/27/2013   Procedure: ANTERIOR CERVICAL /DISCECTOMY FUSION (ACDF) C5-C7  2 LEVELS;  Surgeon: Melina Schools, MD;  Location: [facility];  Service: Orthopedics;  Laterality: N/A;  . BREAST REDUCTION SURGERY Bilateral 09/28/2014   Procedure: MAMMARY REDUCTION  (BREAST) BILATERAL;  Surgeon: Cristine Polio, MD;  Location: [place];  Service: Plastics;  Laterality: Bilateral;  . CHOLECYSTECTOMY  06/1989  . DILATION AND CURETTAGE OF  UTERUS    . HERNIA REPAIR    . KNEE ARTHROSCOPY Left 08/2003  . LAPAROSCOPIC BILATERAL SALPINGECTOMY Bilateral 01/22/2015   Procedure: LAPAROSCOPIC BILATERAL SALPINGECTOMY;  Surgeon: Azucena Fallen, MD;  Location: [facility];  Service: Gynecology;  Laterality: Bilateral;  . LEFT HEART CATHETERIZATION WITH CORONARY ANGIOGRAM N/A 04/15/2013   Procedure: LEFT HEART CATHETERIZATION WITH CORONARY ANGIOGRAM;  Surgeon: Peter M Martinique, MD;  Location: [facility] CATH LAB;  Service: Cardiovascular;  Laterality: N/A;  . LIPOSUCTION Bilateral 09/28/2014   Procedure: LIPOSUCTION;  Surgeon: Cristine Polio, MD;  Location: [place];  Service: Plastics;  Laterality: Bilateral;  . ROBOTIC ASSISTED TOTAL HYSTERECTOMY N/A 01/22/2015   has her ovaries---ROBOTIC ASSISTED TOTAL HYSTERECTOMY With Pelvic Washings, Lysis of Adhesions;  Surgeon: Azucena Fallen, MD;  Location: [facility];  Service: Gynecology;  Laterality: N/A;  . SHOULDER ARTHROSCOPY W/ ROTATOR CUFF REPAIR Right 2006, 2007  . TUBAL LIGATION  1991  . UMBILICAL HERNIA REPAIR  2001, 2003  . [place]    OB History    No data available       Home Medications    Prior to Admission medications   Medication Sig Start Date End Date Taking? Authorizing Provider  albuterol (VENTOLIN HFA) 108 (90 Base) MCG/ACT inhaler Inhale 2 puffs into the lungs every 6 (six) hours as needed for wheezing or shortness of breath. 11/06/16   Colon Branch, MD  Ascorbic Acid (VITAMIN C) 100 MG tablet Take 100 mg by mouth 2 (two) times daily.     [provider]  atorvastatin (LIPITOR) 20 MG tablet Take 1 tablet (20 mg total) by mouth at bedtime. 06/28/16   Colon Branch, MD  baclofen (LIORESAL) 10 MG tablet Take 1 tablet as needed at onset of migraine. 08/23/15   Cameron Sprang, MD  budesonide-formoterol [facility]) 160-4.5 MCG/ACT inhaler Inhale 2 puffs into the lungs 2 (two) times daily. Patient taking differently: Inhale 2 puffs into the lungs 2 (two)  times daily as needed (shortness of breath).  04/16/15   Colon Branch, MD  canagliflozin [facility]) 300 MG TABS tablet Take 300 mg by mouth daily before breakfast. 11/18/14   Colon Branch, MD  fexofenadine (ALLEGRA) 180 MG tablet Take 180 mg by mouth daily.    [provider]  folic acid (FOLVITE) 1 MG tablet Take 1 mg by mouth daily.    [provider]  ibuprofen (ADVIL,MOTRIN) 800 MG tablet Take 1 tablet (800 mg total) by mouth 3 (three) times daily. 09/20/16   Barnet Glasgow, NP  metFORMIN (GLUCOPHAGE) 1000 MG tablet Take 1.5 tablets by mouth every morning and 1 tablet by mouth every evening 12/15/16   Colon Branch, MD  nystatin (MYCOSTATIN) 100000 UNIT/ML suspension Take 5 mLs (500,000 Units total) by mouth 4 (four) times daily. 11/16/16   Colon Branch, MD  topiramate (TOPAMAX) 50 MG tablet Take 1 tablet in AM, 2 tablets in PM 08/23/15   Cameron Sprang, MD  vitamin B-12 (CYANOCOBALAMIN) 500 MCG tablet Take 1,000  mcg by mouth daily.     [provider]    Family History Family History  Problem Relation Age of Onset  . Breast cancer Other         GM  . Diabetes Father        F, B, S  . CAD Father        died of MI @ 6  . Kidney disease Father   . Pancreatic cancer Other        GM  . CAD Sister        MI at age 80  . CAD Brother        CHF, died at age 36  . Heart attack Brother        died suddenly @ 68  . Leukemia Sister   . Colon cancer Neg Hx     Social History Social History  Substance Use Topics  . Smoking status: Never Smoker  . Smokeless tobacco: Never Used  . Alcohol use No     Allergies   Codeine and Vicodin [hydrocodone-acetaminophen]   Review of Systems Review of Systems  Gastrointestinal: Positive for nausea and vomiting.  Musculoskeletal: Positive for arthralgias.  Neurological: Positive for headaches.  All other systems reviewed and are negative.    Physical Exam Updated Vital Signs BP (!) 162/103 (BP Location: Left Arm)    Pulse 79   Temp 98.2 F (36.8 C)   Resp 18   LMP 01/16/2015   SpO2 97%   Physical Exam  Constitutional: She is oriented to person, place, and time. She appears well-developed and well-nourished. No distress.  HENT:  Head: Normocephalic and atraumatic.  Right Ear: External ear normal.  Left Ear: External ear normal.  Eyes: Pupils are equal, round, and reactive to light. Conjunctivae and EOM are normal.  Neck: Normal range of motion and full passive range of motion without pain. Neck supple. No neck rigidity.  No rigidity, no meningismus  Cardiovascular: Normal rate, regular rhythm and normal heart sounds.   No murmur heard. Pulmonary/Chest: Effort normal and breath sounds normal. No respiratory distress. She has no wheezes. She has no rhonchi.  Abdominal: Soft. Bowel sounds are normal. There is no tenderness. There is no guarding.  Musculoskeletal: Normal range of motion. She exhibits no edema.  Trace edema at the ankles, swelling, tenderness, or palpable cords; right knee overall normal in appearance, there is some tenderness over the medial and lateral joint line, no bony deformity, normal flexion and extension, some crepitus noted, DP pulses intact bilaterally  Neurological: She is alert and oriented to person, place, and time. She has normal strength. She displays no tremor. No cranial nerve deficit or sensory deficit. She displays no seizure activity.  AAOx3, answering questions and following commands appropriately; equal strength UE and LE bilaterally; CN grossly intact; moves all extremities appropriately without ataxia; no focal neuro deficits or facial asymmetry appreciated  Skin: Skin is warm and dry. No rash noted. She is not diaphoretic.  Psychiatric: She has a normal mood and affect. Her behavior is normal. Thought content normal.  Nursing note and vitals reviewed.    ED Treatments / Results  Labs (all labs ordered are listed, but only abnormal results are displayed) Labs  Reviewed  CBG MONITORING, ED - Abnormal; Notable for the following:       Result Value   Glucose-Capillary 173 (*)    All other components within normal limits    EKG  EKG Interpretation None  Radiology Dg Knee Complete 4 Views Right  Result Date: 02/13/2017 CLINICAL DATA:  Right knee pain, no known injury, initial encounter EXAM: RIGHT KNEE - COMPLETE 4+ VIEW COMPARISON:  09/20/2016 FINDINGS: Tricompartmental degenerative change is again identified. No joint effusion or acute soft tissue abnormality is noted. No acute fracture or dislocation is seen. Superior medial loose body is again identified and stable. IMPRESSION: Degenerative change without acute abnormality Electronically Signed   By: Inez Catalina M.D.   On: 02/13/2017 10:03    Procedures Procedures (including critical care time)  Medications Ordered in ED Medications  ketorolac (TORADOL) injection 60 mg (60 mg Intramuscular Given 02/13/17 1007)  prochlorperazine (COMPAZINE) injection 10 mg (10 mg Intramuscular Given 02/13/17 1006)     Initial Impression / Assessment and Plan / ED Course  I have reviewed the triage vital signs and the nursing notes.  Pertinent labs & imaging results that were available during my care of the patient were reviewed by me and considered in my medical decision making (see chart for details).  51 year old female here with migraine headache and right knee pain. She is afebrile and nontoxic. Neurologic exam is nonfocal.  No signs or symptoms concerning for meningitis. Reports it feels consistent with her migraine headaches. Will treat with migraine cocktail. Patient also complaining of right knee pain with "popping and clicking" as well as knee giving way. She's had this issue before. We'll plan for plain films of the knee.  No signs/symptoms concerning for DVT or septic joint.  11:12 AM Patient feeling better after treatment here with IM Toradol and Compazine. She is resting comfortably.  States she feels much better. Discussed x-ray results which just revealed degenerative changes, chronic loose body seen. She does see an orthopedist about her back, will plan to follow-up with them about her knee as well. She was given knee sleeve to help with stability. Encouraged to continue elevating her legs in the evening to help with dependent edema.  Can also try compression stockings.  Patient does follow with neurology as well about her migraine headaches. Recommended close follow-up if migraines continue. She understands to return here for any new or worsening symptoms. Patient discharged home in stable condition.  Final Clinical Impressions(s) / ED Diagnoses   Final diagnoses:  Other migraine without status migrainosus, not intractable  Acute pain of right knee    New Prescriptions Discharge Medication List as of 02/13/2017 11:38 AM       Larene Pickett, PA-C 02/13/17 1223    Margette Fast, MD 02/13/17 [phone]

## 2017-02-13 NOTE — ED Notes (Signed)
Patient c/o headache onset 3 days ago States the medication she is RX. Isn't working. C/o right knee pain for several months was suppose to follow up with ortho however they never got back to her.

## 2017-02-13 NOTE — Discharge Instructions (Signed)
As we discussed, right knee films are normal. There were some arthritic changes. You may follow-up with your primary care doctor for this. If you continue having frequent migraines I recommend to follow-up with your neurologist. You can return here for any new or worsening symptoms.

## 2017-02-13 NOTE — ED Triage Notes (Signed)
Patient complains of recurrent migraine headache x 3 days with nausea. Also reports lower extremity swelling in the evenings after standing at work, NAD. Alert and oriented

## 2017-02-19 ENCOUNTER — Ambulatory Visit (INDEPENDENT_AMBULATORY_CARE_PROVIDER_SITE_OTHER): Payer: PRIVATE HEALTH INSURANCE | Admitting: Internal Medicine

## 2017-02-19 ENCOUNTER — Encounter: Payer: Self-pay | Admitting: Internal Medicine

## 2017-02-19 VITALS — BP 124/78 | HR 79 | Temp 98.0°F | Resp 14 | Ht 65.0 in | Wt 303.4 lb

## 2017-02-19 DIAGNOSIS — G8929 Other chronic pain: Secondary | ICD-10-CM | POA: Diagnosis not present

## 2017-02-19 DIAGNOSIS — M25561 Pain in right knee: Secondary | ICD-10-CM

## 2017-02-19 DIAGNOSIS — G43109 Migraine with aura, not intractable, without status migrainosus: Secondary | ICD-10-CM | POA: Diagnosis not present

## 2017-02-19 DIAGNOSIS — E119 Type 2 diabetes mellitus without complications: Secondary | ICD-10-CM

## 2017-02-19 LAB — HEMOGLOBIN A1C: Hgb A1c MFr Bld: 7.7 % — ABNORMAL HIGH (ref 4.6–6.5)

## 2017-02-19 NOTE — Progress Notes (Signed)
Subjective:    Patient ID: [name], female    DOB: May 10, 1966, 51 y.o.   MRN: [id number]  DOS:  02/19/2017 Type of visit - description : Routine checkup Interval history:  DM, since the last visit she is doing a little better with diet, eating more greens  and vegetables. Trying to take a walk some days. Migraines: Long history of such, about a month ago they were more noticeable, self re-started gabapentin as previously prescribed. Went to the  ER 02/13/2017 d/t increasing HAs, was treated with a migraine cocktail, feeling better now, not 100%. Has right knee pain for a while, + swelling, + locking, occasional it buckles. Reports that was without the insurance for a while however reports good med compliance  Review of Systems No chest pain or difficulty breathing Occasional nausea with headache only. No vomiting. No dysuria or gross hematuria  Past Medical History:  Diagnosis Date  . Anxiety state, unspecified 05/16/2013  . Arthritis    "right knee" (08/27/2013)  . Asthma    "only when I get a sinus infection which is 1-2 X/yr" (08/27/2013)  . Chronic pain syndrome   . Diabetes ([place])    "borderline" (08/27/2013)  . Failed back syndrome, cervical   . GERD (gastroesophageal reflux disease)   . Hypertension    no meds  . Lumbar radiculopathy, chronic   . Migraine    "once or twice/month now" (08/27/2013)  . Neck pain    axial, s/p cervical fusion C5-6, C6-7   . Obesity   . Sickle cell trait ([place])   . Wears glasses     Past Surgical History:  Procedure Laterality Date  . ANTERIOR CERVICAL DECOMP/DISCECTOMY FUSION N/A 08/27/2013   Procedure: ANTERIOR CERVICAL /DISCECTOMY FUSION (ACDF) C5-C7  2 LEVELS;  Surgeon: Melina Schools, MD;  Location: [place];  Service: Orthopedics;  Laterality: N/A;  . BREAST REDUCTION SURGERY Bilateral 09/28/2014   Procedure: MAMMARY REDUCTION  (BREAST) BILATERAL;  Surgeon: Cristine Polio, MD;  Location: [place];  Service:  Plastics;  Laterality: Bilateral;  . CHOLECYSTECTOMY  06/1989  . DILATION AND CURETTAGE OF UTERUS    . HERNIA REPAIR    . KNEE ARTHROSCOPY Left 08/2003  . LAPAROSCOPIC BILATERAL SALPINGECTOMY Bilateral 01/22/2015   Procedure: LAPAROSCOPIC BILATERAL SALPINGECTOMY;  Surgeon: Azucena Fallen, MD;  Location: [facility];  Service: Gynecology;  Laterality: Bilateral;  . LEFT HEART CATHETERIZATION WITH CORONARY ANGIOGRAM N/A 04/15/2013   Procedure: LEFT HEART CATHETERIZATION WITH CORONARY ANGIOGRAM;  Surgeon: Peter M Martinique, MD;  Location: [facility];  Service: Cardiovascular;  Laterality: N/A;  . LIPOSUCTION Bilateral 09/28/2014   Procedure: LIPOSUCTION;  Surgeon: Cristine Polio, MD;  Location: [place];  Service: Plastics;  Laterality: Bilateral;  . ROBOTIC ASSISTED TOTAL HYSTERECTOMY N/A 01/22/2015   has her ovaries---ROBOTIC ASSISTED TOTAL HYSTERECTOMY With Pelvic Washings, Lysis of Adhesions;  Surgeon: Azucena Fallen, MD;  Location: [facility];  Service: Gynecology;  Laterality: N/A;  . SHOULDER ARTHROSCOPY W/ ROTATOR CUFF REPAIR Right 2006, 2007  . TUBAL LIGATION  1991  . UMBILICAL HERNIA REPAIR  2001, 2003  . [place] EXTRACTION  1985    Social History   Social History  . Marital status: Divorced    Spouse name: N/A  . Number of children: 2  . Years of education: N/A   Occupational History  . 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 Smoking status: Never Smoker  . Smokeless tobacco: Never  Used  . Alcohol use No  . Drug use: No  . Sexual activity: Not Currently    Birth control/ protection: None   Other Topics Concern  . Not on file   Social History Narrative   Lives in [place] by herself           Allergies as of 02/19/2017      Reactions   Codeine    Facial rash , lip swelling    Vicodin [hydrocodone-acetaminophen] Swelling, Rash   Pt is able to take percocet without problems      Medication List       Accurate as of 02/19/17  11:59 PM. Always use your most recent med list.          albuterol 108 (90 Base) MCG/ACT inhaler Commonly known as:  VENTOLIN HFA Inhale 2 puffs into the lungs every 6 (six) hours as needed for wheezing or shortness of breath.   atorvastatin 20 MG tablet Commonly known as:  LIPITOR Take 1 tablet (20 mg total) by mouth at bedtime.   baclofen 10 MG tablet Commonly known as:  LIORESAL Take 1 tablet as needed at onset of migraine.   budesonide-formoterol 160-4.5 MCG/ACT inhaler Commonly known as:  SYMBICORT Inhale 2 puffs into the lungs 2 (two) times daily.   canagliflozin 300 MG Tabs tablet Commonly known as:  INVOKANA Take 300 mg by mouth daily before breakfast.   fexofenadine 180 MG tablet Commonly known as:  ALLEGRA Take 180 mg by mouth daily.   folic acid 1 MG tablet Commonly known as:  FOLVITE Take 1 mg by mouth daily.   gabapentin 300 MG capsule Commonly known as:  NEURONTIN Take 1 capsule (300 mg total) by mouth at bedtime.   ibuprofen 800 MG tablet Commonly known as:  ADVIL,MOTRIN Take 1 tablet (800 mg total) by mouth 3 (three) times daily.   metFORMIN 1000 MG tablet Commonly known as:  GLUCOPHAGE Take 1.5 tablets by mouth every morning and 1 tablet by mouth every evening   nystatin 100000 UNIT/ML suspension Commonly known as:  MYCOSTATIN Take 5 mLs (500,000 Units total) by mouth 4 (four) times daily.   topiramate 50 MG tablet Commonly known as:  TOPAMAX Take 1 tablet in AM, 2 tablets in PM   vitamin B-12 500 MCG tablet Commonly known as:  CYANOCOBALAMIN Take 1,000 mcg by mouth daily.   vitamin C 100 MG tablet Take 100 mg by mouth 2 (two) times daily.          Objective:   Physical Exam BP 124/78 (BP Location: Left Arm, Patient Position: Sitting, Cuff Size: Normal)   Pulse 79   Temp 98 F (36.7 C) (Oral)   Resp 14   Ht 5\' 5"  (1.651 m)   Wt (!) 303 lb 6 oz (137.6 kg)   LMP 01/16/2015   SpO2 97%   BMI 50.48 kg/m  General:   Well  developed, well nourished . NAD.  HEENT:  Normocephalic . Face symmetric, atraumatic Lungs:  CTA B Normal respiratory effort, no intercostal retractions, no accessory muscle use. Heart: RRR,  no murmur.  No pretibial edema bilaterally  Skin: Not pale. Not jaundice Neurologic:  alert & oriented X3.  Speech normal, gait  unassisted, mild limping due to right knee pain MSK: Right knee: No warm or red, slightly TTP on the lateral aspects. Range of motion is normal. Psych--  Cognition and judgment appear intact.  Cooperative with normal attention span and concentration.  Behavior appropriate. No  anxious or depressed appearing.      Assessment & Plan:   Assessment  DM, no neuropathy Dyslipidemia Elevated BP GERD Asthma-- associated with URIs-allergies  Anxiety MSK: --DJD. Multiple surgeries.  ---Chronic back pain, local injections back (  sees  Dr. Ella Bodo) ---Neck pain-- used to see  Dr Rolena Infante, Dr Nelva Bush now seen at [facility] Migraine headaches -- on topamax, baclofen, per Dr Delice Lesch Sickle cell trait CP: cardiac cath 2014 -- normal  PLAN: DM: On metformin - invokana;  last A1c 7.6, recheck a1c today. Info regards the bariatric office provided, also rec self learning regards healthy lifestyle>>> see AVS. Exercise may be limited by knee pain. Migraines: Currently on Topamax 150 mg daily, self restart gabapentin 300 mg at bedtime 2 weeks ago. Also prn baclofen. Sxs were very good until 4 weeks ago then started to having headaches again. Plans to see neurology. Right knee pain: X-ray showed DJD, may also have a internal derangement. Refer to orthopedic. Morbid obesity: Discussed diet and exercise RTC CPX December 2018  Today, I spent more than  25  min with the patient: >50% of the time counseling regards diet, exercise, also reviewing the chart and labs ordered by other providers

## 2017-02-19 NOTE — Progress Notes (Signed)
Pre visit review using our clinic review tool, if applicable. No additional management support is needed unless otherwise documented below in the visit note. 

## 2017-02-19 NOTE — Patient Instructions (Signed)
GO TO THE LAB : Get the blood work     GO TO THE FRONT DESK Schedule your next appointment for a  physical exam by December 2018    If you need more information about a healthy diet,  Diabetes visit: The American diabetes Association  Http://[email or web address] website it is a great resource http://www.[place].org

## 2017-02-20 MED ORDER — SITAGLIPTIN PHOSPHATE 100 MG PO TABS: 100.0000 mg | ORAL_TABLET | Freq: Every day | ORAL | 6 refills | Status: DC

## 2017-02-20 NOTE — Addendum Note (Signed)
Addended byDamita Dunnings D on: 02/20/2017 03:30 PM   Modules accepted: Orders

## 2017-02-20 NOTE — Assessment & Plan Note (Addendum)
DM: On metformin - invokana;  last A1c 7.6, recheck a1c today. Info regards the bariatric office provided, also rec self learning regards healthy lifestyle>>> see AVS. Exercise may be limited by knee pain. Migraines: Currently on Topamax 150 mg daily, self restart gabapentin 300 mg at bedtime 2 weeks ago. Also prn baclofen. Sxs were very good until 4 weeks ago then started to having headaches again. Plans to see neurology. Right knee pain: X-ray showed DJD, may also have a internal derangement. Refer to orthopedic. Morbid obesity: Discussed diet and exercise RTC CPX December 2018

## 2017-02-22 ENCOUNTER — Ambulatory Visit (INDEPENDENT_AMBULATORY_CARE_PROVIDER_SITE_OTHER): Payer: PRIVATE HEALTH INSURANCE | Admitting: Neurology

## 2017-02-22 ENCOUNTER — Encounter: Payer: Self-pay | Admitting: Neurology

## 2017-02-22 DIAGNOSIS — G43109 Migraine with aura, not intractable, without status migrainosus: Secondary | ICD-10-CM

## 2017-02-22 MED ORDER — TOPIRAMATE 50 MG PO TABS: ORAL_TABLET | ORAL | 3 refills | Status: DC

## 2017-02-22 MED ORDER — BACLOFEN 10 MG PO TABS: ORAL_TABLET | ORAL | 11 refills | Status: DC

## 2017-02-22 MED ORDER — GABAPENTIN 300 MG PO CAPS: ORAL_CAPSULE | ORAL | 3 refills | Status: DC

## 2017-02-22 NOTE — Patient Instructions (Signed)
1. Increase gabapentin 300mg : Take 2 caps every night for 2 weeks, then increase to 3 caps every night 2. Continue Topamax 50mg  in AM, 100mg  in PM 3. Take Baclofen as needed for severe headaches and neck pain 4. Follow-up with eye doctor and Ortho as scheduled 5. Follow-up in 6 months, call for any changes

## 2017-02-22 NOTE — Progress Notes (Signed)
NEUROLOGY FOLLOW UP OFFICE NOTE  CHELISE HANGER [id number]  HISTORY OF PRESENT ILLNESS: I had the pleasure of seeing Martena Emanuele in follow-up in the neurology clinic on 02/22/2017.  The patient was last seen in February 2017 for worsening migraines. On her last visit, she was started on low dose gabapentin 300mg  qhs in addition to Topamax 50mg  in AM, 100mg  in PM. She was lost to follow-up, reporting that she did very well, very rarely getting migraines in 2017. 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 For the past two weeks, she has had only a few good days in between headaches, with headaches lasting 3-4 days. She was in the ER on 02/13/17 and felt better after a migraine cocktail. She has not been getting good rest for the past 2 months, with only 1-2 hours of sleep at night. She has always had sleep issues due to neck pain with on average 3-4 hours of sleep, but this has been worse. She has been dealing with neck pain and right knee pain, unable to see her orthopedic surgeon due to insurance issues. She has an appointment scheduled soon. She has chronic numbness and tingling in both hands, and reports swelling in her knees down to her toes. She feels her vision is blurred and her eyes are burning from looking at the screen all day. She is looking into getting accommodations with a screen cover at work and getting 15 minute breaks when she has more severe headaches. She denies any diplopia, dysarthria/dysphagia, no falls.   HPI: This is a pleasant 51 yo RH woman with a history of morbid obesity, hypertension, diabetes, chronic back pain, and migraines since age 14, who presented for worsening migraines. Migraines usually start in the back of her head then radiate to the frontal regions, with 10/10 sharp, stabbing, and throbbing pain. There is associated nausea, vomiting, photo and phonophobia, neck pain, as well as occasional dizziness and  blurred vision. At times, migraines are preceded by flashing lights. She was having them once a month, however since May 2016, headaches increased in frequency to twice a week. She had found that they would occur more a day or two prior to her cycle. Since April, she has noticed a change in her menstrual period, she had a hysterectomy last July 2016. Other triggers include stress and weather changes. She has been taking Topamax for the past 10 years, increased to 50mg  daily last year. She had been seeing neurologist Dr. Domingo Cocking [facility] until they stopped taking her insurance. She had been receiving trigger point injections in the past, which did help with headaches, but after one session where she had severe neck pain, she stopped this. She does not recall other preventatives tried, but recalls they made her sleep (she mentions ?Valium). Imitrex helped little. Dr. Domingo Cocking had prescribed prn Baclofen for migraine rescue, which takes the edge off. She takes Tramadol for her left sciatica. She tries to get 8 hours of sleep. There is no family history of migraines. She denies any significant head injuries.   I personally reviewed head CT done 09/2011 which was normal.  She had an MRI C-spine done in December with Ortho which showed post-surgical changes with mild cord atrophy at that level. 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   PAST MEDICAL HISTORY: Past Medical History:  Diagnosis Date  . Anxiety state, unspecified 05/16/2013  . Arthritis    "right knee" (08/27/2013)  . Asthma    "only when I get a sinus infection which is 1-2 X/yr" (08/27/2013)  . Chronic pain syndrome   . Diabetes ([place])    "borderline" (08/27/2013)  . Failed back syndrome, cervical   . GERD  (gastroesophageal reflux disease)   . Hypertension    no meds  . Lumbar radiculopathy, chronic   . Migraine    "once or twice/month now" (08/27/2013)  . Neck pain    axial, s/p cervical fusion C5-6, C6-7   . Obesity   . Sickle cell trait ([place])   . Wears glasses     MEDICATIONS: Current Outpatient Prescriptions on File Prior to Visit  Medication Sig Dispense Refill  . albuterol (VENTOLIN HFA) 108 (90 Base) MCG/ACT inhaler Inhale 2 puffs into the lungs every 6 (six) hours as needed for wheezing or shortness of breath. 1 Inhaler 1  . Ascorbic Acid (VITAMIN C) 100 MG tablet Take 100 mg by mouth 2 (two) times daily.     Marland Kitchen atorvastatin (LIPITOR) 20 MG tablet Take 1 tablet (20 mg total) by mouth at bedtime. 30 tablet 3  . baclofen (LIORESAL) 10 MG tablet Take 1 tablet as needed at onset of migraine. 20 each 11  . budesonide-formoterol (SYMBICORT) 160-4.5 MCG/ACT inhaler Inhale 2 puffs into the lungs 2 (two) times daily. (Patient taking differently: Inhale 2 puffs into the lungs 2 (two) times daily as needed (shortness of breath). ) 1 Inhaler 3  . canagliflozin (INVOKANA) 300 MG TABS tablet Take 300 mg by mouth daily before breakfast. 30 tablet 3  . fexofenadine (ALLEGRA) 180 MG tablet Take 180 mg by mouth daily.    . folic acid (FOLVITE) 1 MG tablet Take 1 mg by mouth daily.    Marland Kitchen gabapentin (NEURONTIN) 300 MG capsule Take 1 capsule (300 mg total) by mouth at bedtime.    Marland Kitchen ibuprofen (ADVIL,MOTRIN) 800 MG tablet Take 1 tablet (800 mg total) by mouth 3 (three) times daily. 21 tablet 0  . metFORMIN (GLUCOPHAGE) 1000 MG tablet Take 1.5 tablets by mouth every morning and 1 tablet by mouth every evening 75 tablet 5  . nystatin (MYCOSTATIN) 100000 UNIT/ML suspension Take 5 mLs (500,000 Units total) by mouth 4 (four) times daily. 120 mL 0  . sitaGLIPtin (JANUVIA) 100 MG tablet Take 1 tablet (100 mg total) by mouth daily. 30 tablet 6  . topiramate (TOPAMAX) 50 MG tablet Take 1 tablet in AM, 2 tablets in  PM 270 tablet 3  . vitamin B-12 (CYANOCOBALAMIN) 500 MCG tablet Take 1,000 mcg by mouth daily.      No current facility-administered medications on file prior to visit.     ALLERGIES: Allergies  Allergen Reactions  . Codeine     Facial rash , lip swelling   . Vicodin [Hydrocodone-Acetaminophen] Swelling and Rash    Pt is able to take percocet without problems    FAMILY HISTORY: Family History  Problem Relation Age of Onset  . Breast cancer Other         GM  . Diabetes Father        F, B, S  . CAD Father        died of MI @ 52  . Kidney disease Father   . Pancreatic cancer Other  GM  . CAD Sister        MI at age 19  . CAD Brother        CHF, died at age 58  . Heart attack Brother        died suddenly @ 3  . Leukemia Sister   . Colon cancer Neg Hx     SOCIAL HISTORY: Social History   Social History  . Marital status: Divorced    Spouse name: N/A  . Number of children: 2  . Years of education: N/A   Occupational History  . 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 Smoking status: Never Smoker  . Smokeless tobacco: Never Used  . Alcohol use No  . Drug use: No  . Sexual activity: Not Currently    Birth control/ protection: None   Other Topics Concern  . Not on file   Social History Narrative   Lives in [place] by herself         REVIEW OF SYSTEMS: Constitutional: No fevers, chills, or sweats, no generalized fatigue, change in appetite Eyes: No visual changes, double vision, eye pain Ear, nose and throat: No hearing loss, ear pain, nasal congestion, sore throat Cardiovascular: No chest pain, palpitations Respiratory:  No shortness of breath at rest or with exertion, wheezes GastrointestinaI: No nausea, vomiting, diarrhea, abdominal pain, fecal incontinence Genitourinary:  No dysuria, urinary retention or frequency Musculoskeletal:  + neck pain, back pain Integumentary: No rash, pruritus, skin lesions Neurological:  as above Psychiatric: No depression, +insomnia, anxiety Endocrine: No palpitations, fatigue, diaphoresis, mood swings, change in appetite, change in weight, increased thirst Hematologic/Lymphatic:  No anemia, purpura, petechiae. Allergic/Immunologic: no itchy/runny eyes, nasal congestion, recent allergic reactions, rashes  PHYSICAL EXAM: Vitals:   02/22/17 0958  BP: (!) 146/104  Pulse: 84  SpO2: 98%   General: No acute distress, became tearful during the visit Head:  Normocephalic/atraumatic Neck: supple, + paraspinal tenderness, full range of motion Heart:  Regular rate and rhythm Lungs:  Clear to auscultation bilaterally Back: No paraspinal tenderness Skin/Extremities: No rash, no edema Neurological Exam: alert and oriented to person, place, and time. No aphasia or dysarthria. Fund of knowledge is appropriate.  Recent and remote memory are intact.Attention and concentration are normal.    Able to name objects and repeat phrases. Cranial nerves: Pupils equal, round, reactive to light.  Fundoscopic exam unremarkable, no papilledema. Extraocular movements intact with no nystagmus. Visual fields full. Facial sensation intact. No facial asymmetry. Tongue, uvula, palate midline.  Motor: Bulk and tone normal, muscle strength 5/5 throughout with no pronator drift.  Sensation to light touch intact.  No extinction to double simultaneous stimulation.  Deep tendon reflexes 2+ throughout, toes downgoing.  Finger to nose testing intact.  Gait slow and cautious due to right knee pain. No ataxia.  IMPRESSION: This is a pleasant 51 yo RH woman with a history of morbid obesity, hypertension, diabetes, chronic back pain, and migraines since age 67, who presented for increased migraines with initial good response to addition of low dose gabapentin. She had been near-migraine-free for a year until she started back working last May. Migraines have increased, she will increase gabapentin to 600mg  qhs x 2 weeks,  then 900mg  qhs. Continue Topamax to 50mg  in AM, 100mg  in PM. She takes prn baclofen for headache rescue and neck pain. She knows to minimize rescue medications to 2-3 a week to avoid rebound headaches. She will follow-up with her eye doctor  and Ortho as scheduled. Her BP today is elevated, she is reporting headache and neck pain, continue to follow-up with PCP. She will continue to keep a headache calendar and follow-up in 6 months.   Thank you for allowing me to participate in her care.  Please do not hesitate to call for any questions or concerns.  The duration of this appointment visit was 25 minutes of face-to-face time with the patient.  Greater than 50% of this time was spent in counseling, explanation of diagnosis, planning of further management, and coordination of care.   Ellouise Newer, M.D.   CC: Dr. Larose Kells

## 2017-03-02 ENCOUNTER — Ambulatory Visit (INDEPENDENT_AMBULATORY_CARE_PROVIDER_SITE_OTHER): Payer: PRIVATE HEALTH INSURANCE | Admitting: Orthopaedic Surgery

## 2017-03-02 ENCOUNTER — Encounter (INDEPENDENT_AMBULATORY_CARE_PROVIDER_SITE_OTHER): Payer: Self-pay | Admitting: Orthopaedic Surgery

## 2017-03-02 VITALS — Ht 66.0 in | Wt 305.0 lb

## 2017-03-02 DIAGNOSIS — M1711 Unilateral primary osteoarthritis, right knee: Secondary | ICD-10-CM | POA: Diagnosis not present

## 2017-03-02 NOTE — Addendum Note (Signed)
Addended by: Precious Bard on: 03/02/2017 09:01 AM   Modules accepted: Orders

## 2017-03-02 NOTE — Progress Notes (Signed)
Office Visit Note   Patient: [name]           Date of Birth: 05/31/1966           MRN: [id number] Visit Date: 03/02/2017              Requested by: Colon Branch, [address] [place], [place] [address] PCP: Colon Branch, MD   Assessment & Plan: Visit Diagnoses:  1. Unilateral primary osteoarthritis, right knee     Plan: Tricompartmental right knee degenerative joint disease worst in the patellofemoral compartment.  We discussed at length about weight loss. Her BMI is 49. Her last hemoglobin A1c was 7.7. Physical therapy referral for quad strengthening. Referral to Dr. Leafy Ro for weight loss. Follow-up as needed. Total face to face encounter time was greater than 45 minutes and over half of this time was spent in counseling and/or coordination of care.  Follow-Up Instructions: Return if symptoms worsen or fail to improve.   Orders:  No orders of the defined types were placed in this encounter.  No orders of the defined types were placed in this encounter.     Procedures: Large Joint Inj Date/Time: 03/02/2017 8:30 AM Performed by: Leandrew Koyanagi Authorized by: Leandrew Koyanagi   Consent Given by:  Patient Timeout: prior to procedure the correct patient, procedure, and site was verified   Indications:  Pain Location:  Knee Site:  R knee Prep: patient was prepped and draped in usual sterile fashion   Needle Size:  22 G Ultrasound Guidance: No   Fluoroscopic Guidance: No   Arthrogram: No   Patient tolerance:  Patient tolerated the procedure well with no immediate complications     Clinical Data: No additional findings.   Subjective: Chief Complaint  Patient presents with  . Right Knee - Pain    Patient is a 51 year old very pleasant female who comes in with right knee pain is mainly anterior and on the sides is worse with using stairs and squatting. She denies any injuries or surgeries to the right knee. She denies any numbness and  tingling.    Review of Systems  Constitutional: Negative.   HENT: Negative.   Eyes: Negative.   Respiratory: Negative.   Cardiovascular: Negative.   Endocrine: Negative.   Musculoskeletal: Negative.   Neurological: Negative.   Hematological: Negative.   Psychiatric/Behavioral: Negative.   All other systems reviewed and are negative.    Objective: Vital Signs: Ht 5\' 6"  (1.676 m)   Wt (!) 305 lb (138.3 kg)   LMP 01/16/2015   BMI 49.23 kg/m   Physical Exam  Constitutional: She is oriented to person, place, and time. She appears well-developed and well-nourished.  HENT:  Head: Normocephalic and atraumatic.  Eyes: EOM are normal.  Neck: Neck supple.  Pulmonary/Chest: Effort normal.  Abdominal: Soft.  Neurological: She is alert and oriented to person, place, and time.  Skin: Skin is warm. Capillary refill takes less than 2 seconds.  Psychiatric: She has a normal mood and affect. Her behavior is normal. Judgment and thought content normal.  Nursing note and vitals reviewed.   Ortho Exam Right knee exam shows no joint effusion. She has positive patellar crepitus. Collaterals and cruciates are stable. Specialty Comments:  No specialty comments available.  Imaging: No results found.   PMFS History: Patient Active Problem List   Diagnosis Date Noted  . Unilateral primary osteoarthritis, right knee 03/02/2017  . Impingement syndrome of left  shoulder 06/13/2016  . Sacroiliac dysfunction 03/28/2016  . Dyslipidemia 10/18/2015  . PCP NOTES >>>>>>>>>>> 04/01/2015  . S/P laparoscopic hysterectomy 01/22/2015  . Migraine with aura and without status migrainosus, not intractable 01/20/2015  . Chronic back pain 01/20/2015  . Chronic low back pain 01/18/2015  . Postlaminectomy syndrome, cervical region 01/18/2015  . Lumbar facet arthropathy ([place]) 01/18/2015  . Lumbar degenerative disc disease 01/18/2015  . Neck pain 08/27/2013  . Anxiety state 05/16/2013  . Cervical disc  disorder with radiculopathy of cervical region 04/18/2013  . Morbid obesity ([place]) 04/15/2013  . Diabetes ([place]) 02/14/2013  . Mild anemia 02/14/2013  . HTN (hypertension) 12/12/2012  . Annual physical exam 12/12/2012  . 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 Migraines 12/12/2012  . GERD (gastroesophageal reflux disease)    Past Medical History:  Diagnosis Date  . Anxiety state, unspecified 05/16/2013  . Arthritis    "right knee" (08/27/2013)  . Asthma    "only when I get a sinus infection which is 1-2 X/yr" (08/27/2013)  . Chronic pain syndrome   . Diabetes ([place])    "borderline" (08/27/2013)  . Failed back syndrome, cervical   . GERD (gastroesophageal reflux disease)   . Hypertension    no meds  . Lumbar radiculopathy, chronic   . Migraine    "once or twice/month now" (08/27/2013)  . Neck pain    axial, s/p cervical fusion C5-6, C6-7   . Obesity   . Sickle cell trait ([place])   . Wears glasses     Family History  Problem Relation Age of Onset  . Breast cancer Other         GM  . Diabetes Father        F, B, S  . CAD Father        died of MI @ 22  . Kidney disease Father   . Pancreatic cancer Other        GM  . CAD Sister        MI at age 56  . CAD Brother        CHF, died at age 15  . Heart attack Brother        died suddenly @ 67  . Leukemia Sister   . Colon cancer Neg Hx     Past Surgical History:  Procedure Laterality Date  . ANTERIOR CERVICAL DECOMP/DISCECTOMY FUSION N/A 08/27/2013   Procedure: ANTERIOR CERVICAL /DISCECTOMY FUSION (ACDF) C5-C7  2 LEVELS;  Surgeon: Melina Schools, MD;  Location: [facility];  Service: Orthopedics;  Laterality: N/A;  . BREAST REDUCTION SURGERY Bilateral 09/28/2014   Procedure: MAMMARY REDUCTION  (BREAST) BILATERAL;  Surgeon: Cristine Polio, MD;  Location: [facility];  Service: Plastics;  Laterality: Bilateral;  . CHOLECYSTECTOMY  06/1989  . DILATION AND CURETTAGE OF UTERUS    . HERNIA REPAIR    . KNEE ARTHROSCOPY Left 08/2003  .  LAPAROSCOPIC BILATERAL SALPINGECTOMY Bilateral 01/22/2015   Procedure: LAPAROSCOPIC BILATERAL SALPINGECTOMY;  Surgeon: Azucena Fallen, MD;  Location: [facility];  Service: Gynecology;  Laterality: Bilateral;  . LEFT HEART CATHETERIZATION WITH CORONARY ANGIOGRAM N/A 04/15/2013   Procedure: LEFT HEART CATHETERIZATION WITH CORONARY ANGIOGRAM;  Surgeon: Peter M Martinique, MD;  Location: [facility] C[facility];  Service: Cardiovascular;  Laterality: N/A;  . LIPOSUCTION Bilateral 09/28/2014   Procedure: LIPOSUCTION;  Surgeon: Cristine Polio, MD;  Location: [facility];  Service: Plastics;  Laterality: Bilateral;  . ROBOTIC ASSISTED TOTAL HYSTERECTOMY N/A 01/22/2015   has her ovaries---ROBOTIC ASSISTED  TOTAL HYSTERECTOMY With Pelvic Washings, Lysis of Adhesions;  Surgeon: Azucena Fallen, MD;  Location: [facility];  Service: Gynecology;  Laterality: N/A;  . SHOULDER ARTHROSCOPY W/ ROTATOR CUFF REPAIR Right 2006, 2007  . TUBAL LIGATION  1991  . UMBILICAL HERNIA REPAIR  2001, 2003  . [place] EXTRACTION  1985   Social History   Occupational History  . 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 Smoking status: Never Smoker  . Smokeless tobacco: Never Used  . Alcohol use No  . Drug use: No  . Sexual activity: Not Currently    Birth control/ protection: None

## 2017-03-27 ENCOUNTER — Ambulatory Visit (HOSPITAL_COMMUNITY)
Admission: EM | Admit: 2017-03-27 | Discharge: 2017-03-27 | Disposition: A | Discharge status: Home / Self Care | Payer: PRIVATE HEALTH INSURANCE | Attending: Family Medicine | Admitting: Family Medicine

## 2017-03-27 ENCOUNTER — Encounter (HOSPITAL_COMMUNITY): Payer: Self-pay | Admitting: *Deleted

## 2017-03-27 ENCOUNTER — Telehealth: Payer: Self-pay | Admitting: Internal Medicine

## 2017-03-27 DIAGNOSIS — K0889 Other specified disorders of teeth and supporting structures: Secondary | ICD-10-CM

## 2017-03-27 MED ORDER — DICLOFENAC SODIUM 75 MG PO TBEC: 75.0000 mg | DELAYED_RELEASE_TABLET | Freq: Two times a day (BID) | ORAL | 0 refills | Status: DC

## 2017-03-27 MED ORDER — AMOXICILLIN-POT CLAVULANATE 875-125 MG PO TABS: 1.0000 | ORAL_TABLET | Freq: Two times a day (BID) | ORAL | 0 refills | Status: DC

## 2017-03-27 NOTE — Telephone Encounter (Signed)
Pt says that she have a abcess on her tooth and would like to have a antibiotic sent in to pharmacy. Pt says that she have a dental apt but its not until next month.    Pharmacy: [id number] - [place], [place]

## 2017-03-27 NOTE — Telephone Encounter (Signed)
Pt will need appt here as well to access.

## 2017-03-27 NOTE — Telephone Encounter (Signed)
lvm for pt to return call to schedule apt  °

## 2017-03-27 NOTE — ED Triage Notes (Signed)
Patient reports lower dental pain with swelling. Dentist appointment is in 2 weeks for dentist. States she has had metal taste in mouth.

## 2017-03-28 NOTE — ED Provider Notes (Signed)
[facility]   [id number] 03/27/17 Arrival Time: 1906  ASSESSMENT & PLAN:  1. Pain, dental     Meds ordered this encounter  Medications  . amoxicillin-clavulanate (AUGMENTIN) 875-125 MG tablet    Sig: Take 1 tablet by mouth every 12 (twelve) hours.    Dispense:  14 tablet    Refill:  0  . diclofenac (VOLTAREN) 75 MG EC tablet    Sig: Take 1 tablet (75 mg total) by mouth 2 (two) times daily.    Dispense:  14 tablet    Refill:  0   Keep appt with dentist. May f/u here as needed. Reviewed expectations re: course of current medical issues. Questions answered. Outlined signs and symptoms indicating need for more acute intervention. Patient verbalized understanding. After Visit Summary given.   SUBJECTIVE:  [name] is a 51 y.o. female who reports gradual onset of left lower dental pain. Present for several days. Has appt to see dentist in a few weeks. Pain slightly worsening today. No fever. Tolerating PO intake. No n/v. Questions abscess.  ROS: As per HPI.  OBJECTIVE:  Vitals:   03/27/17 1952  BP: (!) 167/100  Pulse: 89  Resp: 18  Temp: 99.4 F (37.4 C)  SpO2: 98%    General appearance: alert; no distress HENT: normocephalic; atraumatic; dentition: fair; mild tenderness over L rear lower gum; no fluctuance; mild erythema Neck: supple without LAD Lungs: normal respirations Skin: warm and dry Psychological: alert and cooperative; normal mood and affect   Allergies  Allergen Reactions  . Codeine     Facial rash , lip swelling   . Vicodin [Hydrocodone-Acetaminophen] Swelling and Rash    Pt is able to take percocet without problems    Past Medical History:  Diagnosis Date  . Anxiety state, unspecified 05/16/2013  . Arthritis    "right knee" (08/27/2013)  . Asthma    "only when I get a sinus infection which is 1-2 X/yr" (08/27/2013)  . Chronic pain syndrome   . Diabetes ([place])    "borderline" (08/27/2013)  . Failed back syndrome, cervical   .  GERD (gastroesophageal reflux disease)   . Hypertension    no meds  . Lumbar radiculopathy, chronic   . Migraine    "once or twice/month now" (08/27/2013)  . Neck pain    axial, s/p cervical fusion C5-6, C6-7   . Obesity   . Sickle cell trait ([place])   . Wears glasses    Social History   Social History  . Marital status: Divorced    Spouse name: N/A  . Number of children: 2  . Years of education: N/A   Occupational History  . 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 Smoking status: Never Smoker  . Smokeless tobacco: Never Used  . Alcohol use No  . Drug use: No  . Sexual activity: Not Currently    Birth control/ protection: None   Other Topics Concern  . Not on file   Social History Narrative   Lives in [place] by herself        Family History  Problem Relation Age of Onset  . Breast cancer Other         GM  . Diabetes Father        F, B, S  . CAD Father        died of MI @ 77  . Kidney disease Father   . Pancreatic cancer Other  GM  . CAD Sister        MI at age 24  . CAD Brother        CHF, died at age 64  . Heart attack Brother        died suddenly @ 72  . Leukemia Sister   . Colon cancer Neg Hx    Past Surgical History:  Procedure Laterality Date  . ANTERIOR CERVICAL DECOMP/DISCECTOMY FUSION N/A 08/27/2013   Procedure: ANTERIOR CERVICAL /DISCECTOMY FUSION (ACDF) C5-C7  2 LEVELS;  Surgeon: Melina Schools, MD;  Location: [facility];  Service: Orthopedics;  Laterality: N/A;  . BREAST REDUCTION SURGERY Bilateral 09/28/2014   Procedure: MAMMARY REDUCTION  (BREAST) BILATERAL;  Surgeon: Cristine Polio, MD;  Location: [facility];  Service: Plastics;  Laterality: Bilateral;  . CHOLECYSTECTOMY  06/1989  . DILATION AND CURETTAGE OF UTERUS    . HERNIA REPAIR    . KNEE ARTHROSCOPY Left 08/2003  . LAPAROSCOPIC BILATERAL SALPINGECTOMY Bilateral 01/22/2015   Procedure: LAPAROSCOPIC BILATERAL SALPINGECTOMY;  Surgeon: Azucena Fallen, MD;  Location: [facility];  Service: Gynecology;  Laterality: Bilateral;  . LEFT HEART CATHETERIZATION WITH CORONARY ANGIOGRAM N/A 04/15/2013   Procedure: LEFT HEART CATHETERIZATION WITH CORONARY ANGIOGRAM;  Surgeon: Peter M Martinique, MD;  Location: [facility] C[facility];  Service: Cardiovascular;  Laterality: N/A;  . LIPOSUCTION Bilateral 09/28/2014   Procedure: LIPOSUCTION;  Surgeon: Cristine Polio, MD;  Location: [facility];  Service: Plastics;  Laterality: Bilateral;  . ROBOTIC ASSISTED TOTAL HYSTERECTOMY N/A 01/22/2015   has her ovaries---ROBOTIC ASSISTED TOTAL HYSTERECTOMY With Pelvic Washings, Lysis of Adhesions;  Surgeon: Azucena Fallen, MD;  Location: [facility];  Service: Gynecology;  Laterality: N/A;  . SHOULDER ARTHROSCOPY W/ ROTATOR CUFF REPAIR Right 2006, 2007  . TUBAL LIGATION  1991  . UMBILICAL HERNIA REPAIR  2001, 2003  . [place]     Vanessa Kick, MD 03/28/17 [phone]

## 2017-04-07 ENCOUNTER — Emergency Department (HOSPITAL_COMMUNITY)
Admission: EM | Admit: 2017-04-07 | Discharge: 2017-04-08 | Disposition: A | Discharge status: Home / Self Care | Payer: No Typology Code available for payment source | Attending: Emergency Medicine | Admitting: Emergency Medicine

## 2017-04-07 ENCOUNTER — Encounter (HOSPITAL_COMMUNITY): Payer: Self-pay | Admitting: Emergency Medicine

## 2017-04-07 DIAGNOSIS — M79642 Pain in left hand: Secondary | ICD-10-CM | POA: Diagnosis not present

## 2017-04-07 DIAGNOSIS — M549 Dorsalgia, unspecified: Secondary | ICD-10-CM | POA: Diagnosis not present

## 2017-04-07 DIAGNOSIS — Z5321 Procedure and treatment not carried out due to patient leaving prior to being seen by health care provider: Secondary | ICD-10-CM | POA: Insufficient documentation

## 2017-04-07 DIAGNOSIS — M79641 Pain in right hand: Secondary | ICD-10-CM | POA: Insufficient documentation

## 2017-04-07 DIAGNOSIS — M542 Cervicalgia: Secondary | ICD-10-CM | POA: Diagnosis present
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
## 2017-04-08 ENCOUNTER — Encounter (HOSPITAL_COMMUNITY): Payer: Self-pay | Admitting: Emergency Medicine

## 2017-04-08 ENCOUNTER — Ambulatory Visit (HOSPITAL_COMMUNITY)
Admission: EM | Admit: 2017-04-08 | Discharge: 2017-04-08 | Disposition: A | Discharge status: Home / Self Care | Payer: PRIVATE HEALTH INSURANCE | Attending: Emergency Medicine | Admitting: Emergency Medicine

## 2017-04-08 DIAGNOSIS — M791 Myalgia: Secondary | ICD-10-CM

## 2017-04-08 DIAGNOSIS — M7918 Myalgia, other site: Secondary | ICD-10-CM

## 2017-04-08 DIAGNOSIS — S20211A Contusion of right front wall of thorax, initial encounter: Secondary | ICD-10-CM

## 2017-04-08 DIAGNOSIS — S161XXA Strain of muscle, fascia and tendon at neck level, initial encounter: Secondary | ICD-10-CM

## 2017-04-08 MED ORDER — NAPROXEN 375 MG PO TABS: 375.0000 mg | ORAL_TABLET | Freq: Two times a day (BID) | ORAL | 0 refills | Status: DC
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
## 2017-04-08 NOTE — Discharge Instructions (Signed)
Apply ice to the chest and hands. Apply heat to the muscles of the neck and back. Perform gentle stretches after a day or 2. Take the Naprosyn for pain and inflammation. Do not take diclofenac with this medication if you have it. Follow-up with primary care doctor as needed. Realized that she will be sore for a few more days.
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
## 2017-04-08 NOTE — ED Notes (Signed)
Bed: [facility]ALB Expected date:  Expected time:  Means of arrival:  Comments: 

## 2017-04-08 NOTE — ED Notes (Signed)
Patient requested to leave due to wait time. Patient was offered a hall bed and refused it. She stated she wanted to leave and come back tomorrow.

## 2017-04-10 ENCOUNTER — Encounter (HOSPITAL_BASED_OUTPATIENT_CLINIC_OR_DEPARTMENT_OTHER): Payer: Self-pay

## 2017-04-10 ENCOUNTER — Emergency Department (HOSPITAL_BASED_OUTPATIENT_CLINIC_OR_DEPARTMENT_OTHER)
Admission: EM | Admit: 2017-04-10 | Discharge: 2017-04-10 | Disposition: A | Discharge status: Home / Self Care | Payer: No Typology Code available for payment source | Attending: Emergency Medicine | Admitting: Emergency Medicine

## 2017-04-10 ENCOUNTER — Emergency Department (HOSPITAL_BASED_OUTPATIENT_CLINIC_OR_DEPARTMENT_OTHER): Payer: No Typology Code available for payment source

## 2017-04-10 DIAGNOSIS — R079 Chest pain, unspecified: Secondary | ICD-10-CM | POA: Diagnosis present

## 2017-04-10 DIAGNOSIS — Y998 Other external cause status: Secondary | ICD-10-CM | POA: Insufficient documentation

## 2017-04-10 DIAGNOSIS — S298XXA Other specified injuries of thorax, initial encounter: Secondary | ICD-10-CM | POA: Insufficient documentation

## 2017-04-10 DIAGNOSIS — S60221A Contusion of right hand, initial encounter: Secondary | ICD-10-CM | POA: Diagnosis not present

## 2017-04-10 DIAGNOSIS — E119 Type 2 diabetes mellitus without complications: Secondary | ICD-10-CM | POA: Insufficient documentation

## 2017-04-10 DIAGNOSIS — Y92410 Unspecified street and highway as the place of occurrence of the external cause: Secondary | ICD-10-CM | POA: Insufficient documentation

## 2017-04-10 DIAGNOSIS — Z7984 Long term (current) use of oral hypoglycemic drugs: Secondary | ICD-10-CM | POA: Diagnosis not present

## 2017-04-10 DIAGNOSIS — S60222A Contusion of left hand, initial encounter: Secondary | ICD-10-CM | POA: Insufficient documentation

## 2017-04-10 DIAGNOSIS — Y9389 Activity, other specified: Secondary | ICD-10-CM | POA: Diagnosis not present

## 2017-04-10 DIAGNOSIS — I10 Essential (primary) hypertension: Secondary | ICD-10-CM | POA: Diagnosis not present

## 2017-04-10 DIAGNOSIS — Z79899 Other long term (current) drug therapy: Secondary | ICD-10-CM | POA: Insufficient documentation

## 2017-04-10 DIAGNOSIS — J45909 Unspecified asthma, uncomplicated: Secondary | ICD-10-CM | POA: Insufficient documentation

## 2017-04-10 DIAGNOSIS — W2210XA Striking against or struck by unspecified automobile airbag, initial encounter: Secondary | ICD-10-CM | POA: Diagnosis not present

## 2017-04-10 MED ORDER — OXYCODONE-ACETAMINOPHEN 5-325 MG PO TABS
1.0000 | ORAL_TABLET | Freq: Once | ORAL | Status: AC
  Administered 2017-04-10: 1 via ORAL
  Filled 2017-04-10: qty 1

## 2017-04-10 MED ORDER — OXYCODONE-ACETAMINOPHEN 5-325 MG PO TABS: 1.0000 | ORAL_TABLET | Freq: Four times a day (QID) | ORAL | 0 refills | Status: DC | PRN

## 2017-04-10 NOTE — ED Notes (Signed)
Patient transported to X-ray 
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
## 2017-04-10 NOTE — ED Notes (Signed)
Pt verbalizes understanding of d/c instructions and denies any further needs at this time. 

## 2017-04-10 NOTE — ED Notes (Signed)
Pt returned from xray
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
## 2017-04-19 ENCOUNTER — Telehealth: Payer: Self-pay | Admitting: Internal Medicine

## 2017-04-19 NOTE — Telephone Encounter (Signed)
°  Relation to pt: self  Call back [place]:  Reason for call:  [facility], [place], [place] faxed over clearance form regarding tooth extraction. Patient BP was high therefore specialist requiring PCP to sign off on form and would like patient to follow up with PCP prior to procedure, pease confirm when form is received. Patient schedule with Dr. Larose Kells 04/23/17

## 2017-04-20 ENCOUNTER — Ambulatory Visit (INDEPENDENT_AMBULATORY_CARE_PROVIDER_SITE_OTHER): Payer: PRIVATE HEALTH INSURANCE | Admitting: Internal Medicine

## 2017-04-20 ENCOUNTER — Encounter: Payer: Self-pay | Admitting: Internal Medicine

## 2017-04-20 VITALS — BP 144/80 | HR 84 | Temp 98.2°F | Resp 14 | Ht 66.0 in | Wt 304.2 lb

## 2017-04-20 DIAGNOSIS — G43109 Migraine with aura, not intractable, without status migrainosus: Secondary | ICD-10-CM | POA: Diagnosis not present

## 2017-04-20 DIAGNOSIS — I10 Essential (primary) hypertension: Secondary | ICD-10-CM

## 2017-04-20 MED ORDER — LOSARTAN POTASSIUM 50 MG PO TABS: 50.0000 mg | ORAL_TABLET | Freq: Every day | ORAL | 1 refills | Status: DC

## 2017-04-20 NOTE — Progress Notes (Signed)
Subjective:    Patient ID: [name], female    DOB: September 26, 1965, 51 y.o.   MRN: [id number]  DOS:  04/20/2017 Type of visit - description : acute Interval history: Went to her dentist for a dental extraction yesterday, BP was elevated ~ 164/97, was requested to come here for further eval. Good compliance of medication she does not take BP meds. She takes NSAIDs sporadically. DM: Good compliance with Januvia Headaches: Since the last visit saw neurology, note reviewed  Wt Readings from Last 3 Encounters:  04/20/17 (!) 304 lb 4 oz (138 kg)  04/10/17 300 lb (136.1 kg)  03/02/17 (!) 305 lb (138.3 kg)   BP Readings from Last 3 Encounters:  04/20/17 (!) 144/80  04/10/17 (!) 196/111  04/08/17 (!) 135/91     Review of Systems   Past Medical History:  Diagnosis Date  . Anxiety state, unspecified 05/16/2013  . Arthritis    "right knee" (08/27/2013)  . Asthma    "only when I get a sinus infection which is 1-2 X/yr" (08/27/2013)  . Chronic pain syndrome   . Diabetes ([place])    "borderline" (08/27/2013)  . Failed back syndrome, cervical   . GERD (gastroesophageal reflux disease)   . Hypertension    no meds  . Lumbar radiculopathy, chronic   . Migraine    "once or twice/month now" (08/27/2013)  . Neck pain    axial, s/p cervical fusion C5-6, C6-7   . Obesity   . Sickle cell trait ([place])   . Wears glasses     Past Surgical History:  Procedure Laterality Date  . ANTERIOR CERVICAL DECOMP/DISCECTOMY FUSION N/A 08/27/2013   Procedure: ANTERIOR CERVICAL /DISCECTOMY FUSION (ACDF) C5-C7  2 LEVELS;  Surgeon: Melina Schools, MD;  Location: [facility];  Service: Orthopedics;  Laterality: N/A;  . BREAST REDUCTION SURGERY Bilateral 09/28/2014   Procedure: MAMMARY REDUCTION  (BREAST) BILATERAL;  Surgeon: Cristine Polio, MD;  Location: [facility];  Service: Plastics;  Laterality: Bilateral;  . CHOLECYSTECTOMY  06/1989  . DILATION AND CURETTAGE OF UTERUS    . HERNIA REPAIR    .  KNEE ARTHROSCOPY Left 08/2003  . LAPAROSCOPIC BILATERAL SALPINGECTOMY Bilateral 01/22/2015   Procedure: LAPAROSCOPIC BILATERAL SALPINGECTOMY;  Surgeon: Azucena Fallen, MD;  Location: [facility];  Service: Gynecology;  Laterality: Bilateral;  . LEFT HEART CATHETERIZATION WITH CORONARY ANGIOGRAM N/A 04/15/2013   Procedure: LEFT HEART CATHETERIZATION WITH CORONARY ANGIOGRAM;  Surgeon: Peter M Martinique, MD;  Location: [facility];  Service: Cardiovascular;  Laterality: N/A;  . LIPOSUCTION Bilateral 09/28/2014   Procedure: LIPOSUCTION;  Surgeon: Cristine Polio, MD;  Location: [facility];  Service: Plastics;  Laterality: Bilateral;  . ROBOTIC ASSISTED TOTAL HYSTERECTOMY N/A 01/22/2015   has her ovaries---ROBOTIC ASSISTED TOTAL HYSTERECTOMY With Pelvic Washings, Lysis of Adhesions;  Surgeon: Azucena Fallen, MD;  Location: [facility];  Service: Gynecology;  Laterality: N/A;  . SHOULDER ARTHROSCOPY W/ ROTATOR CUFF REPAIR Right 2006, 2007  . TUBAL LIGATION  1991  . UMBILICAL HERNIA REPAIR  2001, 2003  . [place] EXTRACTION  1985    Social History   Social History  . Marital status: Divorced    Spouse name: N/A  . Number of children: 2  . Years of education: N/A   Occupational History  . 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 Smoking status: Never Smoker  . Smokeless tobacco: Never Used  . Alcohol use No  . Drug use:  No  . Sexual activity: Not Currently    Birth control/ protection: None   Other Topics Concern  . Not on file   Social History Narrative   Lives in [place] by herself           Allergies as of 04/20/2017      Reactions   Codeine    Facial rash , lip swelling    Vicodin [hydrocodone-acetaminophen] Swelling, Rash   Pt is able to take percocet without problems      Medication List       Accurate as of 04/20/17 11:59 PM. Always use your most recent med list.          albuterol 108 (90 Base) MCG/ACT inhaler Commonly known as:   VENTOLIN HFA Inhale 2 puffs into the lungs every 6 (six) hours as needed for wheezing or shortness of breath.   atorvastatin 20 MG tablet Commonly known as:  LIPITOR Take 1 tablet (20 mg total) by mouth at bedtime.   baclofen 10 MG tablet Commonly known as:  LIORESAL Take 1 tablet as needed at onset of migraine.   budesonide-formoterol 160-4.5 MCG/ACT inhaler Commonly known as:  SYMBICORT Inhale 2 puffs into the lungs 2 (two) times daily.   fexofenadine 180 MG tablet Commonly known as:  ALLEGRA Take 180 mg by mouth daily.   folic acid 1 MG tablet Commonly known as:  FOLVITE Take 1 mg by mouth daily.   gabapentin 300 MG capsule Commonly known as:  NEURONTIN Take 2 capsules (600 mg total) by mouth at bedtime.   losartan 50 MG tablet Commonly known as:  COZAAR Take 1 tablet (50 mg total) by mouth daily.   metFORMIN 1000 MG tablet Commonly known as:  GLUCOPHAGE Take 1.5 tablets by mouth every morning and 1 tablet by mouth every evening   naproxen 375 MG tablet Commonly known as:  NAPROSYN Take 1 tablet (375 mg total) by mouth 2 (two) times daily.   oxyCODONE-acetaminophen 5-325 MG tablet Commonly known as:  PERCOCET Take 1 tablet by mouth every 6 (six) hours as needed (for pain).   sitaGLIPtin 100 MG tablet Commonly known as:  JANUVIA Take 1 tablet (100 mg total) by mouth daily.   topiramate 50 MG tablet Commonly known as:  TOPAMAX Take 1 tablet in AM, 2 tablets in PM   vitamin B-12 500 MCG tablet Commonly known as:  CYANOCOBALAMIN Take 1,000 mcg by mouth daily.   vitamin C 100 MG tablet Take 100 mg by mouth 2 (two) times daily.          Objective:   Physical Exam BP (!) 144/80 (BP Location: Right Wrist, Patient Position: Sitting, Cuff Size: Small)   Pulse 84   Temp 98.2 F (36.8 C) (Oral)   Resp 14   Ht 5\' 6"  (1.676 m)   Wt (!) 304 lb 4 oz (138 kg)   LMP 01/16/2015   BMI 49.11 kg/m  General:   Well developed, morbidly obese lady, NAD.  HEENT:    Normocephalic . Face symmetric, atraumatic Lungs:  CTA B Normal respiratory effort, no intercostal retractions, no accessory muscle use. Heart: RRR,  no murmur.  Trace pretibial edema bilaterally  Skin: Not pale. Not jaundice Neurologic:  alert & oriented X3.  Speech normal, gait appropriate for age and unassisted Psych--  Cognition and judgment appear intact.  Cooperative with normal attention span and concentration.  Behavior appropriate. No anxious or depressed appearing.      Assessment & Plan:  Assessment  HTN dx 04-2017 DM, no neuropathy Dyslipidemia Elevated BP GERD Asthma-- associated with URIs-allergies  Anxiety MSK: --DJD. Multiple surgeries.  ---Chronic back pain, local injections back (  sees  Dr. Ella Bodo) ---Neck pain-- used to see  Dr Rolena Infante, Dr Nelva Bush now seen at [facility] Migraine headaches -- on topamax, baclofen, per Dr Delice Lesch Sickle cell trait CP: cardiac cath 2014 -- normal  PLAN: HTN: no previous h/o. BP was elevated yesterday at the dentist, on chart review it has been elevated few times in the last few weeks, today is 144/80. Discussed  low-salt diet which she said she follows, start losartan 50 mg, call with BP readings in a week, BMP in 2 weeks. Dental extraction: If BP is better next week she will be able to proceed. Migraines: Saw neurology 02/22/2017, they increase gabapentin and continue with Topamax and baclofen. Follow-up: 2 weeks for labs. CPX in December already scheduled

## 2017-04-20 NOTE — Patient Instructions (Addendum)
GO TO THE FRONT DESK Schedule labs to be done 2 weeks from today  Start losartan 50 mg one tablet daily for blood pressure  Check the  blood pressure  daily, call next week with readings. Blood pressure GOAL  is between 110/65 and  135/85. If it is consistently higher or lower, let me know

## 2017-04-20 NOTE — Telephone Encounter (Signed)
Form not received today as of yet/SLS 10/12

## 2017-04-20 NOTE — Progress Notes (Signed)
Pre visit review using our clinic review tool, if applicable. No additional management support is needed unless otherwise documented below in the visit note. 

## 2017-04-21 NOTE — Assessment & Plan Note (Signed)
HTN: no previous h/o. BP was elevated yesterday at the dentist, on chart review it has been elevated few times in the last few weeks, today is 144/80. Discussed  low-salt diet which she said she follows, start losartan 50 mg, call with BP readings in a week, BMP in 2 weeks. Dental extraction: If BP is better next week she will be able to proceed. Migraines: Saw neurology 02/22/2017, they increase gabapentin and continue with Topamax and baclofen. Follow-up: 2 weeks for labs. CPX in December already scheduled

## 2017-04-23 ENCOUNTER — Ambulatory Visit: Payer: PRIVATE HEALTH INSURANCE | Admitting: Internal Medicine

## 2017-04-25 NOTE — Telephone Encounter (Signed)
Received paperwork via fax for Medical Clearance for Dental Treatment; forwarded to provider with attached 04/20/17 OV note/SLS 10/17

## 2017-05-01 ENCOUNTER — Other Ambulatory Visit (INDEPENDENT_AMBULATORY_CARE_PROVIDER_SITE_OTHER): Payer: Self-pay

## 2017-05-01 DIAGNOSIS — I10 Essential (primary) hypertension: Secondary | ICD-10-CM

## 2017-05-02 LAB — BASIC METABOLIC PANEL
BUN: 8 mg/dL (ref 6–23)
CO2: 27 mEq/L (ref 19–32)
Calcium: 9.6 mg/dL (ref 8.4–10.5)
Chloride: 103 mEq/L (ref 96–112)
Creatinine, Ser: 0.7 mg/dL (ref 0.40–1.20)
GFR: 113.6 mL/min (ref >60.00)
Glucose, Bld: 134 mg/dL — ABNORMAL HIGH (ref 70–99)
Potassium: 3.7 mEq/L (ref 3.5–5.1)
Sodium: 140 mEq/L (ref 135–145)

## 2017-05-03 ENCOUNTER — Telehealth: Payer: Self-pay | Admitting: Internal Medicine

## 2017-05-03 NOTE — Telephone Encounter (Signed)
Patient sent me her BP readings, they are definitely improving in the 133/80, 125/ 81 She is clear to have her dental procedures

## 2017-05-03 NOTE — Telephone Encounter (Signed)
Forms completed and faxed to [place] at [phone]. Forms sent for scanning. Received fax confirmation.

## 2017-05-03 NOTE — Telephone Encounter (Signed)
LMOM informing Pt that she has been cleared for procedure.

## 2017-05-04 ENCOUNTER — Other Ambulatory Visit: Payer: Self-pay | Admitting: Emergency Medicine

## 2017-05-04 ENCOUNTER — Other Ambulatory Visit: Payer: Self-pay

## 2017-05-04 ENCOUNTER — Other Ambulatory Visit: Payer: PRIVATE HEALTH INSURANCE

## 2017-05-04 DIAGNOSIS — I10 Essential (primary) hypertension: Secondary | ICD-10-CM

## 2017-05-04 MED ORDER — LOSARTAN POTASSIUM 50 MG PO TABS: 50.0000 mg | ORAL_TABLET | Freq: Every day | ORAL | 6 refills | Status: DC

## 2017-05-05 IMAGING — DX DG LUMBAR SPINE COMPLETE 4+V
5 series · 5 of 5 positions shown · non-contrast
Comparison: Lumbar spine MRI 05/06/2014, no prior lumbar spine
radiographs

CLINICAL DATA: Fall down steps and lower back, with low
back/lumbosacral pain.

EXAM:
LUMBAR SPINE - COMPLETE 4+ VIEW

[l-spine ap]
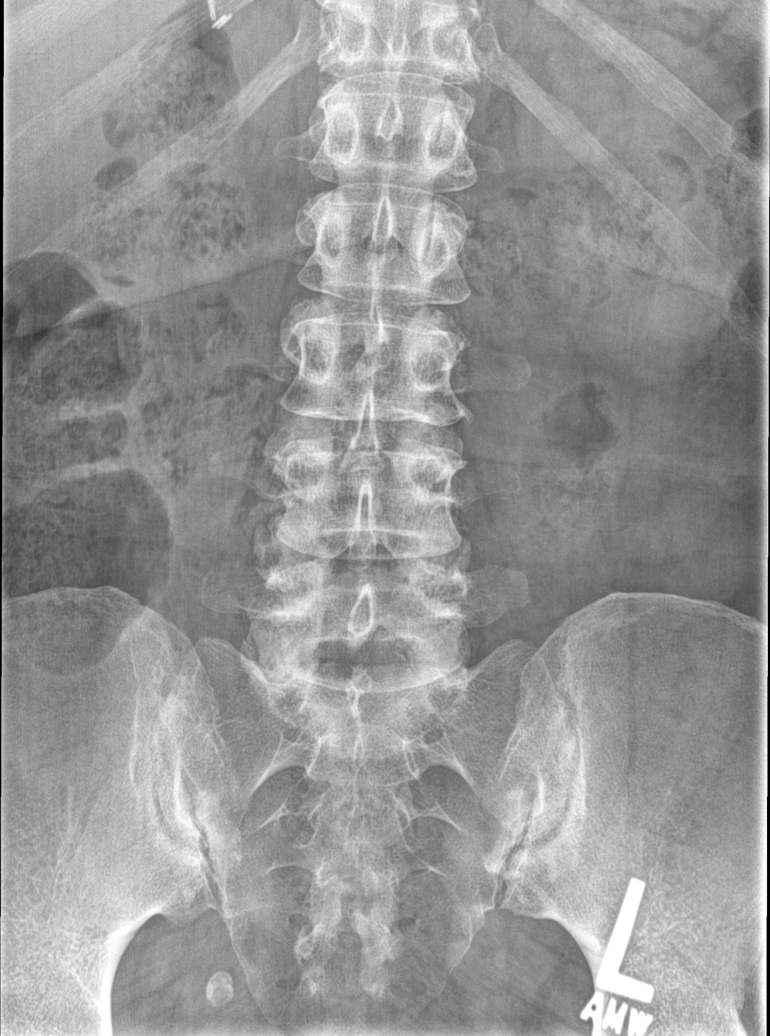

[l-spine obl (1 of 2)]
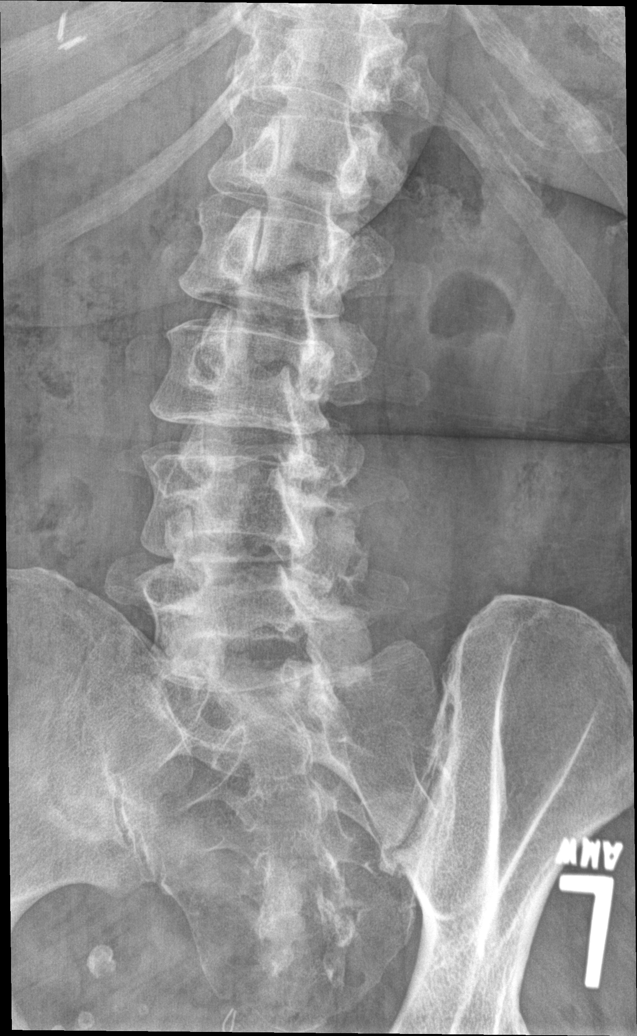

[l-spine obl (2 of 2)]
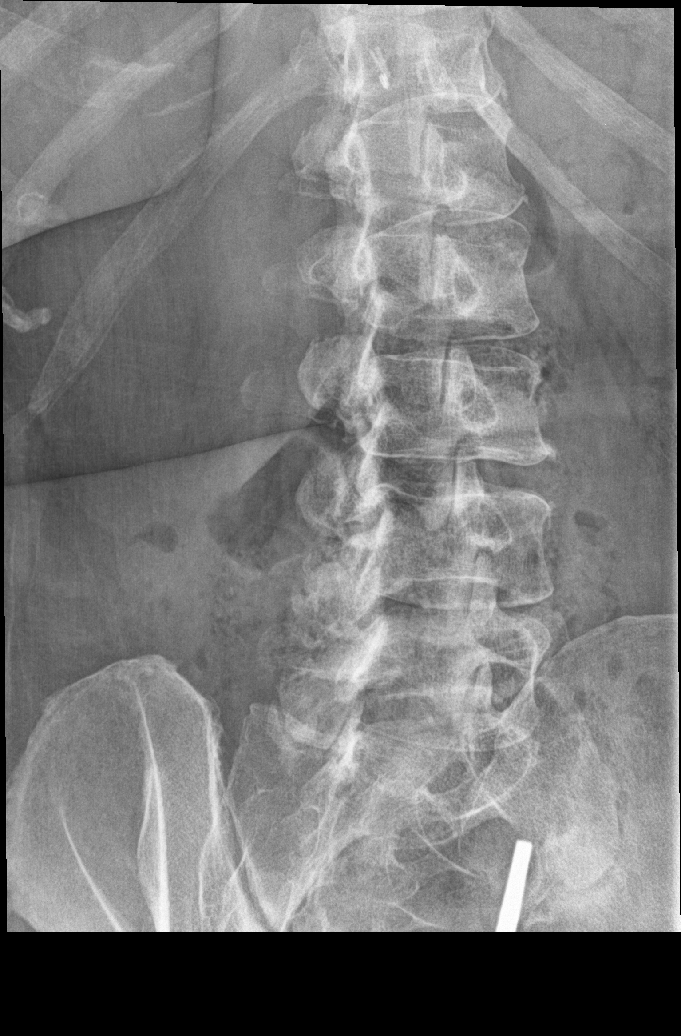

[l-spine lat]
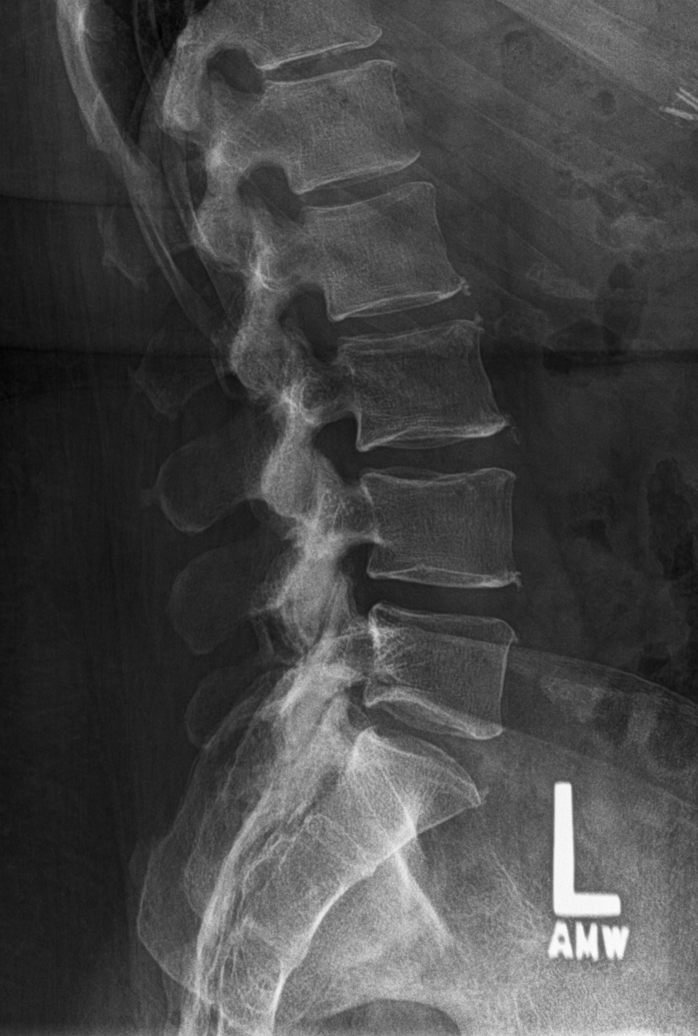

[l-spine spot]
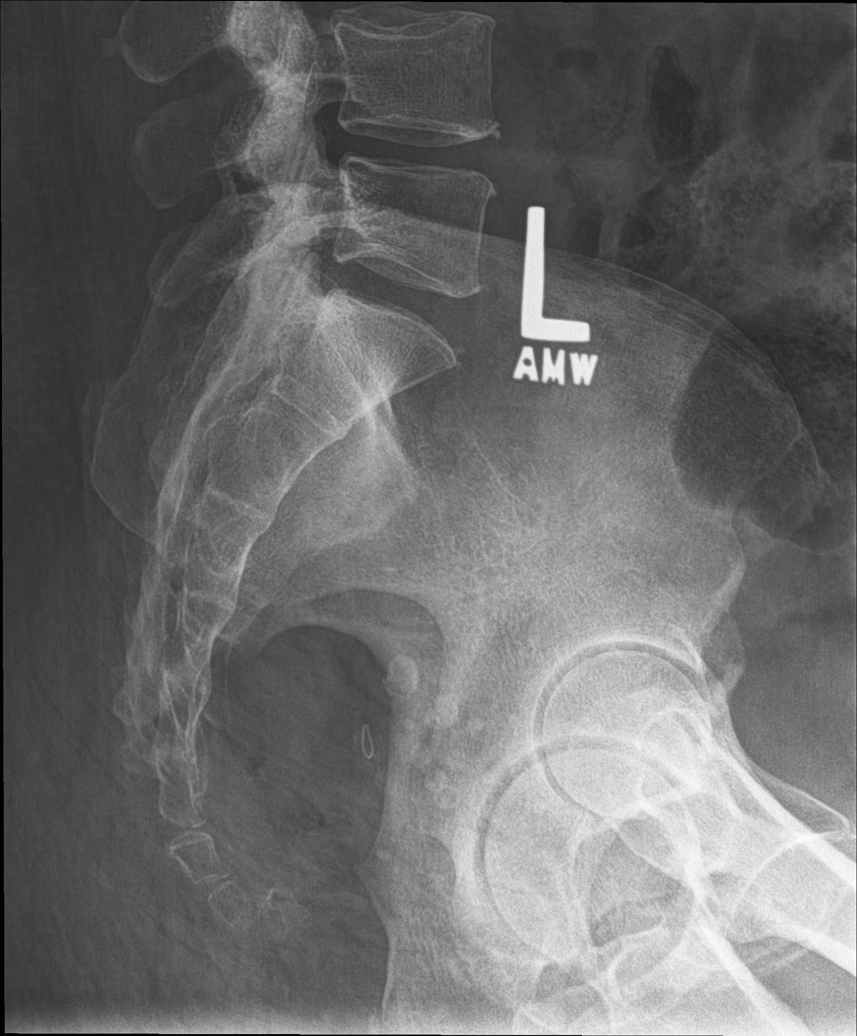

[5 of 5 positions shown; findings below may reference images not displayed]

FINDINGS: The alignment is maintained. Vertebral body heights are normal.
There is no listhesis. The posterior elements are intact. Disc
spaces are preserved. Trace endplate spurring in the mid lumbar
spine. No fracture. Sacroiliac joints are symmetric, mild
degenerative change, left greater than right.
IMPRESSION: No fracture or subluxation of the lumbar spine.

## 2017-06-21 ENCOUNTER — Ambulatory Visit (HOSPITAL_COMMUNITY)
Admission: EM | Admit: 2017-06-21 | Discharge: 2017-06-21 | Disposition: A | Discharge status: Home / Self Care | Payer: BLUE CROSS/BLUE SHIELD | Attending: Urgent Care | Admitting: Urgent Care

## 2017-06-21 ENCOUNTER — Encounter (HOSPITAL_COMMUNITY): Payer: Self-pay | Admitting: Urgent Care

## 2017-06-21 DIAGNOSIS — J45909 Unspecified asthma, uncomplicated: Secondary | ICD-10-CM | POA: Diagnosis not present

## 2017-06-21 DIAGNOSIS — I10 Essential (primary) hypertension: Secondary | ICD-10-CM | POA: Diagnosis not present

## 2017-06-21 DIAGNOSIS — J069 Acute upper respiratory infection, unspecified: Secondary | ICD-10-CM | POA: Diagnosis not present

## 2017-06-21 DIAGNOSIS — B9789 Other viral agents as the cause of diseases classified elsewhere: Secondary | ICD-10-CM | POA: Diagnosis not present

## 2017-06-21 DIAGNOSIS — E119 Type 2 diabetes mellitus without complications: Secondary | ICD-10-CM | POA: Diagnosis not present

## 2017-06-21 DIAGNOSIS — R05 Cough: Secondary | ICD-10-CM

## 2017-06-21 DIAGNOSIS — R030 Elevated blood-pressure reading, without diagnosis of hypertension: Secondary | ICD-10-CM

## 2017-06-21 MED ORDER — PREDNISONE 20 MG PO TABS: ORAL_TABLET | ORAL | 0 refills | Status: DC

## 2017-06-21 MED ORDER — BENZONATATE 100 MG PO CAPS: 100.0000 mg | ORAL_CAPSULE | Freq: Three times a day (TID) | ORAL | 0 refills | Status: DC | PRN

## 2017-06-21 NOTE — Discharge Instructions (Signed)
For sore throat try using a honey-based tea. Use 3 teaspoons of honey with juice squeezed from half lemon. Place shaved pieces of ginger into 1/2-1 cup of water and warm over stove top. Then mix the ingredients and repeat every 4 hours as needed. 

## 2017-06-21 NOTE — ED Provider Notes (Signed)
MRN: [id number] DOB: 03-25-1966  Subjective:   [name] is a 51 y.o. female presenting for 4 day history of productive cough that elicits coughing fits, chest pain. Also has some wheezing, post-nasal drainage. Has tried otc antihistamine, warm salt water gargle, nasal saline, humidifier. Has been using albuterol inhaler 4 times daily, using Symbicort twice daily. Denies fever, dyspnea, sinus pain, ear pain, throat pain, n/v, abdominal pain, hematuria. Denies smoking cigarettes. Denies history of heart failure. She reports compliance with BP medications. A1c was 7.7% 02/19/2017. Has an appointment with her internist next week for an annual physical.  No current facility-administered medications for this encounter.   Current Outpatient Medications:  .  albuterol (VENTOLIN HFA) 108 (90 Base) MCG/ACT inhaler, Inhale 2 puffs into the lungs every 6 (six) hours as needed for wheezing or shortness of breath., Disp: 1 Inhaler, Rfl: 1 .  Ascorbic Acid (VITAMIN C) 100 MG tablet, Take 100 mg by mouth 2 (two) times daily. , Disp: , Rfl:  .  atorvastatin (LIPITOR) 20 MG tablet, Take 1 tablet (20 mg total) by mouth at bedtime., Disp: 30 tablet, Rfl: 3 .  baclofen (LIORESAL) 10 MG tablet, Take 1 tablet as needed at onset of migraine., Disp: 20 each, Rfl: 11 .  budesonide-formoterol (SYMBICORT) 160-4.5 MCG/ACT inhaler, Inhale 2 puffs into the lungs 2 (two) times daily. (Patient taking differently: Inhale 2 puffs into the lungs 2 (two) times daily as needed (shortness of breath). ), Disp: 1 Inhaler, Rfl: 3 .  fexofenadine (ALLEGRA) 180 MG tablet, Take 180 mg by mouth daily., Disp: , Rfl:  .  folic acid (FOLVITE) 1 MG tablet, Take 1 mg by mouth daily., Disp: , Rfl:  .  gabapentin (NEURONTIN) 300 MG capsule, Take 2 capsules (600 mg total) by mouth at bedtime., Disp: , Rfl:  .  losartan (COZAAR) 50 MG tablet, Take 1 tablet (50 mg total) by mouth daily., Disp: 30 tablet, Rfl: 6 .  metFORMIN (GLUCOPHAGE) 1000 MG  tablet, Take 1.5 tablets by mouth every morning and 1 tablet by mouth every evening, Disp: 75 tablet, Rfl: 5 .  naproxen (NAPROSYN) 375 MG tablet, Take 1 tablet (375 mg total) by mouth 2 (two) times daily., Disp: 20 tablet, Rfl: 0 .  oxyCODONE-acetaminophen (PERCOCET) 5-325 MG tablet, Take 1 tablet by mouth every 6 (six) hours as needed (for pain)., Disp: 20 tablet, Rfl: 0 .  sitaGLIPtin (JANUVIA) 100 MG tablet, Take 1 tablet (100 mg total) by mouth daily., Disp: 30 tablet, Rfl: 6 .  topiramate (TOPAMAX) 50 MG tablet, Take 1 tablet in AM, 2 tablets in PM, Disp: 270 tablet, Rfl: 3 .  vitamin B-12 (CYANOCOBALAMIN) 500 MCG tablet, Take 1,000 mcg by mouth daily. , Disp: , Rfl:    Tequia is allergic to codeine and vicodin [hydrocodone-acetaminophen].  Reyanne  has a past medical history of Anxiety state, unspecified (05/16/2013), Arthritis, Asthma, Chronic pain syndrome, Diabetes ([place]), Failed back syndrome, cervical, GERD (gastroesophageal reflux disease), Hypertension, Lumbar radiculopathy, chronic, Migraine, Neck pain, Obesity, Sickle cell trait ([place]), and Wears glasses. Also  has a past surgical history that includes Shoulder arthroscopy w/ rotator cuff repair (Right, 2006, 2007); Umbilical hernia repair (2001, 2003); Wisdom tooth extraction (1985); Cholecystectomy (06/1989); Hernia repair; Knee arthroscopy (Left, 08/2003); Tubal ligation (1991); Anterior cervical decomp/discectomy fusion (N/A, 08/27/2013); left heart catheterization with coronary angiogram (N/A, 04/15/2013); Breast reduction surgery (Bilateral, 09/28/2014); Liposuction (Bilateral, 09/28/2014); Dilation and curettage of uterus; Robotic assisted total hysterectomy (N/A, 01/22/2015); and Laparoscopic bilateral salpingectomy (Bilateral, 01/22/2015).  Objective:   Vitals: BP (!) 188/110 (BP Location: Right Arm)   Pulse (!) 103   Temp 98.2 F (36.8 C) (Oral)   Resp 20   LMP 01/16/2015   SpO2 95%   BP Readings from Last 3 Encounters:  06/21/17  (!) 188/110  04/20/17 (!) 144/80  04/10/17 (!) 196/111    Physical Exam  Constitutional: She is oriented to person, place, and time. She appears well-developed and well-nourished.  HENT:  TM's intact bilaterally, no effusions or erythema. Nasal turbinates pink and moist, nasal passages patent. No sinus tenderness. Oropharynx clear, mucous membranes moist.   Eyes: Right eye exhibits no discharge. Left eye exhibits no discharge.  Neck: Normal range of motion. Neck supple.  Cardiovascular: Normal rate, regular rhythm and intact distal pulses. Exam reveals no gallop and no friction rub.  No murmur heard. Pulmonary/Chest: No respiratory distress. She has no wheezes. She has no rales.  Lymphadenopathy:    She has no cervical adenopathy.  Neurological: She is alert and oriented to person, place, and time.  Skin: Skin is warm and dry.  Psychiatric: She has a normal mood and affect.   Assessment and Plan :   Viral URI with cough  Uncomplicated asthma, unspecified asthma severity, unspecified whether persistent  Essential hypertension  Elevated blood pressure reading  Controlled type 2 diabetes mellitus without complication, without long-term current use of insulin ([place])  Physical exam findings reassuring. Will manage supportively, patient given parameters for using prednisone. Schedule albuterol in the meantime. She is to f/u with her internist, counseled on differential which includes heart failure given persistently elevated BP. Return-to-clinic precautions discussed, patient verbalized understanding.   Jaynee Eagles, PA-C [facility]  06/21/2017  10:25 AM    Jaynee Eagles, PA-C 06/21/17 1605

## 2017-06-21 NOTE — ED Triage Notes (Signed)
Pt here for cough and wheezing x 1 week; pt sts hx of asthma

## 2017-06-28 ENCOUNTER — Encounter: Payer: PRIVATE HEALTH INSURANCE | Admitting: Internal Medicine

## 2017-07-05 ENCOUNTER — Emergency Department: Payer: BLUE CROSS/BLUE SHIELD

## 2017-07-05 ENCOUNTER — Encounter: Payer: Self-pay | Admitting: Emergency Medicine

## 2017-07-05 ENCOUNTER — Emergency Department
Admission: EM | Admit: 2017-07-05 | Discharge: 2017-07-05 | Disposition: A | Discharge status: Home / Self Care | Payer: BLUE CROSS/BLUE SHIELD | Attending: Emergency Medicine | Admitting: Emergency Medicine

## 2017-07-05 ENCOUNTER — Other Ambulatory Visit: Payer: Self-pay

## 2017-07-05 DIAGNOSIS — J45909 Unspecified asthma, uncomplicated: Secondary | ICD-10-CM | POA: Diagnosis not present

## 2017-07-05 DIAGNOSIS — R079 Chest pain, unspecified: Secondary | ICD-10-CM | POA: Insufficient documentation

## 2017-07-05 DIAGNOSIS — E119 Type 2 diabetes mellitus without complications: Secondary | ICD-10-CM | POA: Diagnosis not present

## 2017-07-05 DIAGNOSIS — I10 Essential (primary) hypertension: Secondary | ICD-10-CM | POA: Diagnosis not present

## 2017-07-05 DIAGNOSIS — Z79899 Other long term (current) drug therapy: Secondary | ICD-10-CM | POA: Diagnosis not present

## 2017-07-05 LAB — BASIC METABOLIC PANEL
Anion gap: 7 (ref 5–15)
BUN: 13 mg/dL (ref 6–20)
CO2: 24 mmol/L (ref 22–32)
Calcium: 8.9 mg/dL (ref 8.9–10.3)
Chloride: 104 mmol/L (ref 101–111)
Creatinine, Ser: 0.81 mg/dL (ref 0.44–1.00)
GFR calc Af Amer: >60 mL/min (ref >60)
GFR calc non Af Amer: >60 mL/min (ref >60)
Glucose, Bld: 218 mg/dL — ABNORMAL HIGH (ref 65–99)
Potassium: 3.8 mmol/L (ref 3.5–5.1)
Sodium: 135 mmol/L (ref 135–145)

## 2017-07-05 LAB — CBC
HCT: 38.6 % (ref 35.0–47.0)
Hemoglobin: 12.7 g/dL (ref 12.0–16.0)
MCH: 27.3 pg (ref 26.0–34.0)
MCHC: 32.9 g/dL (ref 32.0–36.0)
MCV: 83 fL (ref 80.0–100.0)
Platelets: 299 10*3/uL (ref 150–440)
RBC: 4.65 MIL/uL (ref 3.80–5.20)
RDW: 14.5 % (ref 11.5–14.5)
WBC: 8.6 10*3/uL (ref 3.6–11.0)

## 2017-07-05 LAB — TROPONIN I
Troponin I: <0.03 ng/mL (ref <0.03)
Troponin I: <0.03 ng/mL (ref <0.03)

## 2017-07-05 LAB — POCT PREGNANCY, URINE: Preg Test, Ur: NEGATIVE

## 2017-07-05 NOTE — ED Triage Notes (Signed)
Pt arrived via EMS for reports of central chest pain that began this morning. Pt reports initially radiated to right arm but denies radiation now. EMS reports VSS, CBG 266. Pt self administered ibuprofen and tylenol without relief. EMS administered ASA 324 mg and 1" NTG paste without relief.

## 2017-07-05 NOTE — ED Notes (Signed)
Chest pain after awakening this AM. Similar episode approx 4 years ago when patient was "under a lot of stress". Pt reports nausea that has gotten better at this time. Pt alert and oriented X4, active, cooperative, pt in NAD. RR even and unlabored, color WNL.

## 2017-07-05 NOTE — ED Notes (Signed)
Report to [place], [place]

## 2017-07-05 NOTE — ED Notes (Signed)
Pt given cup of water 

## 2017-07-05 NOTE — Discharge Instructions (Signed)
You have been seen in the emergency department today for chest pain. Your workup has shown normal results. As we discussed please follow-up with your primary care physician in the next 1-2 days for recheck. Return to the emergency department for any further chest pain, trouble breathing, or any other symptom personally concerning to yourself. °

## 2017-07-05 NOTE — ED Provider Notes (Signed)
[facility] Emergency Department Provider Note  Time seen: 4:14 PM  I have reviewed the triage vital signs and the nursing notes.   HISTORY  Chief Complaint Chest Pain    HPI [name] is a 51 y.o. female with a past medical history of anxiety, diabetes, hypertension, obesity, presents to the emergency department for chest pain.  According to the patient she awoke around 630 this morning with central chest pain which has remained somewhat constant and progressively worsened today.  Patient states however over the last 2 or 3 hours that has eased off a bit.  Currently states the chest pain is a 6/10 in severity.  Denies any shortness of breath nausea or diaphoresis.  Denies any leg pain or swelling.  Largely negative review of systems.  Past Medical History:  Diagnosis Date  . Anxiety state, unspecified 05/16/2013  . Arthritis    "right knee" (08/27/2013)  . Asthma    "only when I get a sinus infection which is 1-2 X/yr" (08/27/2013)  . Chronic pain syndrome   . Diabetes ([place])    "borderline" (08/27/2013)  . Failed back syndrome, cervical   . GERD (gastroesophageal reflux disease)   . Hypertension    no meds  . Lumbar radiculopathy, chronic   . Migraine    "once or twice/month now" (08/27/2013)  . Neck pain    axial, s/p cervical fusion C5-6, C6-7   . Obesity   . Sickle cell trait ([place])   . Wears glasses     Patient Active Problem List   Diagnosis Date Noted  . Unilateral primary osteoarthritis, right knee 03/02/2017  . Impingement syndrome of left shoulder 06/13/2016  . Sacroiliac dysfunction 03/28/2016  . Dyslipidemia 10/18/2015  . PCP NOTES >>>>>>>>>>> 04/01/2015  . S/P laparoscopic hysterectomy 01/22/2015  . Migraine with aura and without status migrainosus, not intractable 01/20/2015  . Chronic back pain 01/20/2015  . Chronic low back pain 01/18/2015  . Postlaminectomy syndrome, cervical region 01/18/2015  . Lumbar facet arthropathy  01/18/2015  . Lumbar degenerative disc disease 01/18/2015  . Neck pain 08/27/2013  . Anxiety state 05/16/2013  . Cervical disc disorder with radiculopathy of cervical region 04/18/2013  . Morbid obesity ([place]) 04/15/2013  . Diabetes ([place]) 02/14/2013  . Mild anemia 02/14/2013  . HTN (hypertension) 12/12/2012  . Annual physical exam 12/12/2012  . 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 Migraines 12/12/2012  . GERD (gastroesophageal reflux disease)     Past Surgical History:  Procedure Laterality Date  . ANTERIOR CERVICAL DECOMP/DISCECTOMY FUSION N/A 08/27/2013   Procedure: ANTERIOR CERVICAL /DISCECTOMY FUSION (ACDF) C5-C7  2 LEVELS;  Surgeon: Melina Schools, MD;  Location: [place];  Service: Orthopedics;  Laterality: N/A;  . BREAST REDUCTION SURGERY Bilateral 09/28/2014   Procedure: MAMMARY REDUCTION  (BREAST) BILATERAL;  Surgeon: Cristine Polio, MD;  Location: [place];  Service: Plastics;  Laterality: Bilateral;  . CHOLECYSTECTOMY  06/1989  . DILATION AND CURETTAGE OF UTERUS    . HERNIA REPAIR    . KNEE ARTHROSCOPY Left 08/2003  . LAPAROSCOPIC BILATERAL SALPINGECTOMY Bilateral 01/22/2015   Procedure: LAPAROSCOPIC BILATERAL SALPINGECTOMY;  Surgeon: Azucena Fallen, MD;  Location: [facility];  Service: Gynecology;  Laterality: Bilateral;  . LEFT HEART CATHETERIZATION WITH CORONARY ANGIOGRAM N/A 04/15/2013   Procedure: LEFT HEART CATHETERIZATION WITH CORONARY ANGIOGRAM;  Surgeon: Peter M Martinique, MD;  Location: [facility] CATH LAB;  Service: Cardiovascular;  Laterality: N/A;  . LIPOSUCTION Bilateral 09/28/2014   Procedure: LIPOSUCTION;  Surgeon: Cristine Polio,  MD;  Location: [place];  Service: Plastics;  Laterality: Bilateral;  . ROBOTIC ASSISTED TOTAL HYSTERECTOMY N/A 01/22/2015   has her ovaries---ROBOTIC ASSISTED TOTAL HYSTERECTOMY With Pelvic Washings, Lysis of Adhesions;  Surgeon: Azucena Fallen, MD;  Location: [facility];  Service: Gynecology;  Laterality: N/A;  . SHOULDER  ARTHROSCOPY W/ ROTATOR CUFF REPAIR Right 2006, 2007  . TUBAL LIGATION  1991  . UMBILICAL HERNIA REPAIR  2001, 2003  . [place]    Prior to Admission medications   Medication Sig Start Date End Date Taking? Authorizing Provider  albuterol (VENTOLIN HFA) 108 (90 Base) MCG/ACT inhaler Inhale 2 puffs into the lungs every 6 (six) hours as needed for wheezing or shortness of breath. 11/06/16   Colon Branch, MD  Ascorbic Acid (VITAMIN C) 100 MG tablet Take 100 mg by mouth 2 (two) times daily.     [provider]  atorvastatin (LIPITOR) 20 MG tablet Take 1 tablet (20 mg total) by mouth at bedtime. 06/28/16   Colon Branch, MD  baclofen (LIORESAL) 10 MG tablet Take 1 tablet as needed at onset of migraine. 02/22/17   Cameron Sprang, MD  benzonatate (TESSALON) 100 MG capsule Take 1-2 capsules (100-200 mg total) by mouth 3 (three) times daily as needed for cough. 06/21/17   Jaynee Eagles, PA-C  budesonide-formoterol [facility]) 160-4.5 MCG/ACT inhaler Inhale 2 puffs into the lungs 2 (two) times daily. Patient taking differently: Inhale 2 puffs into the lungs 2 (two) times daily as needed (shortness of breath).  04/16/15   Colon Branch, MD  fexofenadine (ALLEGRA) 180 MG tablet Take 180 mg by mouth daily.    [provider]  folic acid (FOLVITE) 1 MG tablet Take 1 mg by mouth daily.    [provider]  gabapentin (NEURONTIN) 300 MG capsule Take 2 capsules (600 mg total) by mouth at bedtime. 04/20/17   Colon Branch, MD  losartan (COZAAR) 50 MG tablet Take 1 tablet (50 mg total) by mouth daily. 05/04/17   Colon Branch, MD  metFORMIN (GLUCOPHAGE) 1000 MG tablet Take 1.5 tablets by mouth every morning and 1 tablet by mouth every evening 12/15/16   Colon Branch, MD  naproxen (NAPROSYN) 375 MG tablet Take 1 tablet (375 mg total) by mouth 2 (two) times daily. 04/08/17   Janne Napoleon, NP  oxyCODONE-acetaminophen (PERCOCET) 5-325 MG tablet Take 1 tablet by mouth every 6 (six) hours as  needed (for pain). 04/10/17   Molpus, John, MD  predniSONE (DELTASONE) 20 MG tablet Take 2 tablets daily with breakfast. 06/21/17   Jaynee Eagles, PA-C  sitaGLIPtin (JANUVIA) 100 MG tablet Take 1 tablet (100 mg total) by mouth daily. 02/20/17   Colon Branch, MD  topiramate (TOPAMAX) 50 MG tablet Take 1 tablet in AM, 2 tablets in PM 02/22/17   Cameron Sprang, MD  vitamin B-12 (CYANOCOBALAMIN) 500 MCG tablet Take 1,000 mcg by mouth daily.     [provider]    Allergies  Allergen Reactions  . Codeine     Facial rash , lip swelling   . Vicodin [Hydrocodone-Acetaminophen] Swelling and Rash    Pt is able to take percocet without problems    Family History  Problem Relation Age of Onset  . Breast cancer Other         GM  . Diabetes Father        F, B, S  . CAD Father  died of MI @ 79  . Kidney disease Father   . Pancreatic cancer Other        GM  . CAD Sister        MI at age 46  . CAD Brother        CHF, died at age 65  . Heart attack Brother        died suddenly @ 54  . Leukemia Sister   . Colon cancer Neg Hx     Social History Social History   Tobacco Use  . Smoking status: Never Smoker  . Smokeless tobacco: Never Used  Substance Use Topics  . Alcohol use: No    Alcohol/week: 0.0 oz  . Drug use: No    Review of Systems Constitutional: Negative for fever. Eyes: Negative for visual changes. ENT: Negative for congestion Cardiovascular: Positive for central chest pain/pressure Respiratory: Negative for shortness of breath.  Patient does state recent upper respiratory infection which she is just getting over.   Gastrointestinal: Negative for abdominal pain Genitourinary: Negative for dysuria. Musculoskeletal: Negative for back pain. Skin: Negative for rash. Neurological: Intermittent headache for the past 2-3 days. All other ROS negative  ____________________________________________   PHYSICAL EXAM:  VITAL SIGNS: ED Triage Vitals  Enc Vitals  Group     BP 07/05/17 1558 127/71     Pulse Rate 07/05/17 1558 87     Resp 07/05/17 1558 17     Temp 07/05/17 1558 98.2 F (36.8 C)     Temp Source 07/05/17 1558 Oral     SpO2 07/05/17 1558 94 %     Weight 07/05/17 1556 (!) 330 lb (149.7 kg)     Height 07/05/17 1556 5\' 6"  (1.676 m)     Head Circumference --      Peak Flow --      Pain Score 07/05/17 1555 6     Pain Loc --      Pain Edu? --      Excl. in [place]? --     Constitutional: Alert and oriented. Well appearing and in no distress. Eyes: Normal exam ENT   Head: Normocephalic and atraumatic   Mouth/Throat: Mucous membranes are moist. Cardiovascular: Normal rate, regular rhythm. No murmurs, rubs, or gallops. Respiratory: Normal respiratory effort without tachypnea nor retractions. Breath sounds are clear and equal bilaterally. No wheezes/rales/rhonchi. Gastrointestinal: Soft and nontender. No distention.  Musculoskeletal: Nontender with normal range of motion in all extremities. No lower extremity tenderness or edema. Neurologic:  Normal speech and language. No gross focal neurologic deficits Skin:  Skin is warm, dry and intact.  Psychiatric: Mood and affect are normal.  ____________________________________________    EKG  EKG reviewed and interpreted by myself shows normal sinus rhythm 87 bpm with a narrow QRS, normal axis, normal intervals, nonspecific but no concerning ST changes.  ____________________________________________    RADIOLOGY  Chest x-ray clear  ____________________________________________   INITIAL IMPRESSION / ASSESSMENT AND PLAN / ED COURSE  Pertinent labs & imaging results that were available during my care of the patient were reviewed by me and considered in my medical decision making (see chart for details).  Patient presents to the emergency department for central chest pain beginning around 630 this morning.  Differential would include chest wall pain, anxiety, ACS, pneumonia,  pneumothorax.  We will check labs, chest x-ray and continue to closely monitor in the emergency department.  Overall the patient appears well, no distress with an overall normal physical examination.  I reviewed  the patient's records including recent ER visits.  Patient has had 7 ER visits in the past 6 months with a largely noncontributory to today's ER visit.  Repeat troponin is negative.  Labs are normal.  Patient states her chest pain has resolved.  I discussed my normal chest pain return precautions as well as cardiology follow-up for a stress test.  Patient agreeable to this plan of care.  ____________________________________________   FINAL CLINICAL IMPRESSION(S) / ED DIAGNOSES  Chest pain    Harvest Dark, MD 07/05/17 Kathyrn Drown

## 2017-07-05 NOTE — ED Notes (Signed)

## 2017-07-18 ENCOUNTER — Encounter: Payer: PRIVATE HEALTH INSURANCE | Admitting: Internal Medicine

## 2017-08-01 ENCOUNTER — Ambulatory Visit (INDEPENDENT_AMBULATORY_CARE_PROVIDER_SITE_OTHER): Payer: BLUE CROSS/BLUE SHIELD | Admitting: Internal Medicine

## 2017-08-01 ENCOUNTER — Encounter: Payer: Self-pay | Admitting: Internal Medicine

## 2017-08-01 VITALS — BP 136/82 | HR 76 | Temp 98.4°F | Resp 14 | Ht 66.0 in | Wt 305.4 lb

## 2017-08-01 DIAGNOSIS — R079 Chest pain, unspecified: Secondary | ICD-10-CM

## 2017-08-01 DIAGNOSIS — F411 Generalized anxiety disorder: Secondary | ICD-10-CM | POA: Diagnosis not present

## 2017-08-01 DIAGNOSIS — E785 Hyperlipidemia, unspecified: Secondary | ICD-10-CM

## 2017-08-01 DIAGNOSIS — Z23 Encounter for immunization: Secondary | ICD-10-CM | POA: Diagnosis not present

## 2017-08-01 DIAGNOSIS — E119 Type 2 diabetes mellitus without complications: Secondary | ICD-10-CM

## 2017-08-01 DIAGNOSIS — Z0000 Encounter for general adult medical examination without abnormal findings: Secondary | ICD-10-CM | POA: Diagnosis not present

## 2017-08-01 MED ORDER — ZOLPIDEM TARTRATE 10 MG PO TABS: 5.0000 mg | ORAL_TABLET | Freq: Every evening | ORAL | 0 refills | Status: DC | PRN

## 2017-08-01 NOTE — Patient Instructions (Signed)
GO TO THE LAB : Get the blood work     GO TO THE FRONT DESK Schedule your next appointment for a checkup in 3 months   DIABETES self learn tools:  Get the book "Diabetes for Dummies" Get the book: "The [facility] Diabetes diet"   Online resources: The American diabetes Association     diabetes.[facility] website it is a Microbiologist  [place].org  The [facility] web site has a diabetes section  [employer].se    If severe or persistent chest pain, go to the ER or call 911   Ambien, either half or 1 tablet at bedtime if you cannot sleep

## 2017-08-01 NOTE — Progress Notes (Signed)
Pre visit review using our clinic review tool, if applicable. No additional management support is needed unless otherwise documented below in the visit note. 

## 2017-08-01 NOTE — Assessment & Plan Note (Signed)
-  Tdap 2014; pnm shot-2016; prevnar 2017 . Flu shot today   . -CCS: Never had a cscope, 3 modalities discussed, provided an IFOB,  will call  if interested on a cscope or cologuard --Female care per gyn,Dr Benjie Karvonen plans to call them; has  MMG @ Gyn, no report in [place];  h/o abn PAPs, s/p hysterectomy, no oophorectomy - (+) FH: DM,  breast cancer and CAD. - Labs: Needs AST, ALT, FLP, A1c, TSH, IFOB  -Diet-exercise discussed

## 2017-08-01 NOTE — Progress Notes (Signed)
Subjective:    Patient ID: [name], female    DOB: August 09, 1965, 52 y.o.   MRN: [id number]  DOS:  08/01/2017 Type of visit - description : CPX Interval history: In addition to CPX, we also address other issues.  Since the last visit 04/20/2017, had 2 ED visits. ED 07/05/2017, was seen with chest pain, chest x-ray, CBC, BMP, troponins blood sugar was in the 200s.  Pregnancy test negative.  EKG with nonspecific ST changes. Was discharged home, was recommended a stress test and cardiology eval.  Review of Systems Since she left the ER, she still has episodes of chest pain, approximately 3 times a week, usually at work when she is stressed out.  Located in the middle of the chest,  like a punch or a squeeze.  It may last few hours, radiates to the right arm.  No associated with left arm pain, nausea, neck pain, sweats or syncope.  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 Not sleeping well.  Other than above, a 14 point review of systems is negative     Past Medical History:  Diagnosis Date  . Anxiety state, unspecified 05/16/2013  . Arthritis    "right knee" (08/27/2013)  . Asthma    "only when I get a sinus infection which is 1-2 X/yr" (08/27/2013)  . Chronic pain syndrome   . Diabetes ([place])    "borderline" (08/27/2013)  . Failed back syndrome, cervical   . GERD (gastroesophageal reflux disease)   . Hypertension    no meds  . Lumbar radiculopathy, chronic   . Migraine    "once or twice/month now" (08/27/2013)  . Neck pain    axial, s/p cervical fusion C5-6, C6-7   . Obesity   . Sickle cell trait ([place])   . Wears glasses     Past Surgical History:  Procedure Laterality Date  . ANTERIOR CERVICAL DECOMP/DISCECTOMY FUSION N/A 08/27/2013   Procedure: ANTERIOR CERVICAL /DISCECTOMY FUSION (ACDF) C5-C7  2 LEVELS;  Surgeon: Melina Schools, MD;  Location: [place];  Service: Orthopedics;  Laterality: N/A;  . BREAST REDUCTION SURGERY Bilateral  09/28/2014   Procedure: MAMMARY REDUCTION  (BREAST) BILATERAL;  Surgeon: Cristine Polio, MD;  Location: [place];  Service: Plastics;  Laterality: Bilateral;  . CHOLECYSTECTOMY  06/1989  . DILATION AND CURETTAGE OF UTERUS    . HERNIA REPAIR    . KNEE ARTHROSCOPY Left 08/2003  . LAPAROSCOPIC BILATERAL SALPINGECTOMY Bilateral 01/22/2015   Procedure: LAPAROSCOPIC BILATERAL SALPINGECTOMY;  Surgeon: Azucena Fallen, MD;  Location: [facility];  Service: Gynecology;  Laterality: Bilateral;  . LEFT HEART CATHETERIZATION WITH CORONARY ANGIOGRAM N/A 04/15/2013   Procedure: LEFT HEART CATHETERIZATION WITH CORONARY ANGIOGRAM;  Surgeon: Peter M Martinique, MD;  Location: [facility] CATH LAB;  Service: Cardiovascular;  Laterality: N/A;  . LIPOSUCTION Bilateral 09/28/2014   Procedure: LIPOSUCTION;  Surgeon: Cristine Polio, MD;  Location: [place];  Service: Plastics;  Laterality: Bilateral;  . ROBOTIC ASSISTED TOTAL HYSTERECTOMY N/A 01/22/2015   has her ovaries---ROBOTIC ASSISTED TOTAL HYSTERECTOMY With Pelvic Washings, Lysis of Adhesions;  Surgeon: Azucena Fallen, MD;  Location: [facility];  Service: Gynecology;  Laterality: N/A;  . SHOULDER ARTHROSCOPY W/ ROTATOR CUFF REPAIR Right 2006, 2007  . TUBAL LIGATION  1991  . UMBILICAL HERNIA REPAIR  2001, 2003  . [place] EXTRACTION  1985    Social History   Socioeconomic History  . Marital status: Married    Spouse name: Not  on file  . Number of children: 2  . Years of education: Not on file  . Highest education level: Not on file  Social Needs  . Financial resource strain: Not on file  . Food insecurity - worry: Not on file  . Food insecurity - inability: Not on file  . Transportation needs - medical: Not on file  . Transportation needs - non-medical: Not on file  Occupational History  . Occupation: works , Insurance underwriter: [employer]  Tobacco Use  . Smoking status: Never Smoker  . Smokeless tobacco: Never Used  Substance and  Sexual Activity  . Alcohol use: No    Alcohol/week: 0.0 oz  . Drug use: No  . Sexual activity: Not Currently    Birth control/protection: None  Other Topics Concern  . Not on file  Social History Narrative   Lives in [place] , married 05-2017   2 adult children         Family History  Problem Relation Age of Onset  . Breast cancer Other         GM  . Diabetes Father        F, B, S  . CAD Father        died of MI @ 73  . Kidney disease Father   . Pancreatic cancer Other        GM  . CAD Sister        MI at age 65  . CAD Brother        CHF, died at age 3  . Heart attack Brother        died suddenly @ 98  . Leukemia Sister   . Colon cancer Neg Hx      Allergies as of 08/01/2017      Reactions   Codeine    Facial rash , lip swelling    Vicodin [hydrocodone-acetaminophen] Swelling, Rash   Pt is able to take percocet without problems      Medication List        Accurate as of 08/01/17 11:59 PM. Always use your most recent med list.          albuterol 108 (90 Base) MCG/ACT inhaler Commonly known as:  VENTOLIN HFA Inhale 2 puffs into the lungs every 6 (six) hours as needed for wheezing or shortness of breath.   aspirin EC 81 MG tablet Take 81 mg by mouth daily.   atorvastatin 20 MG tablet Commonly known as:  LIPITOR Take 1 tablet (20 mg total) by mouth at bedtime.   baclofen 10 MG tablet Commonly known as:  LIORESAL Take 1 tablet as needed at onset of migraine.   budesonide-formoterol 160-4.5 MCG/ACT inhaler Commonly known as:  SYMBICORT Inhale 2 puffs into the lungs 2 (two) times daily.   fexofenadine 180 MG tablet Commonly known as:  ALLEGRA Take 180 mg by mouth daily.   folic acid 1 MG tablet Commonly known as:  FOLVITE Take 1 mg by mouth daily.   gabapentin 300 MG capsule Commonly known as:  NEURONTIN Take 2 capsules (600 mg total) by mouth at bedtime.   losartan 50 MG tablet Commonly known as:  COZAAR Take 1 tablet (50 mg total) by mouth  daily.   metFORMIN 1000 MG tablet Commonly known as:  GLUCOPHAGE Take 1.5 tablets by mouth every morning and 1 tablet by mouth every evening   naproxen 375 MG tablet Commonly known as:  NAPROSYN Take 1 tablet (375 mg  total) by mouth 2 (two) times daily.   sitaGLIPtin 100 MG tablet Commonly known as:  JANUVIA Take 1 tablet (100 mg total) by mouth daily.   topiramate 50 MG tablet Commonly known as:  TOPAMAX Take 1 tablet in AM, 2 tablets in PM   vitamin B-12 500 MCG tablet Commonly known as:  CYANOCOBALAMIN Take 1,000 mcg by mouth daily.   vitamin C 100 MG tablet Take 100 mg by mouth 2 (two) times daily.   zolpidem 10 MG tablet Commonly known as:  AMBIEN Take 0.5-1 tablets (5-10 mg total) by mouth at bedtime as needed for sleep.          Objective:   Physical Exam BP 136/82 (BP Location: Right Arm, Patient Position: Sitting, Cuff Size: Normal)   Pulse 76   Temp 98.4 F (36.9 C) (Oral)   Resp 14   Ht 5\' 6"  (1.676 m)   Wt (!) 305 lb 6 oz (138.5 kg)   LMP 01/16/2015   SpO2 97%   BMI 49.29 kg/m   General:   Well developed, morbidly obese appearing . NAD.  Neck: No  thyromegaly  HEENT:  Normocephalic . Face symmetric, atraumatic Lungs:  CTA B Normal respiratory effort, no intercostal retractions, no accessory muscle use. Heart: RRR,  no murmur.  No pretibial edema bilaterally  Abdomen:  Not distended, soft, non-tender. No rebound or rigidity.   Skin: Exposed areas without rash. Not pale. Not jaundice Neurologic:  alert & oriented X3.  Speech normal, gait appropriate for age and unassisted Strength symmetric and appropriate for age.  Psych: Cognition and judgment appear intact.  Cooperative with normal attention span and concentration.  Behavior appropriate. No anxious or depressed appearing.     Assessment & Plan:   Assessment  HTN dx 04-2017 DM, no neuropathy Dyslipidemia GERD Asthma-- associated with URIs-allergies  Anxiety [facility]: Dr Erlinda Hong, Dr  Letta Pate --DJD. Multiple surgeries.  --Chronic back pain, local injections back (  sees  Dr. Ella Bodo) --Neck pain-- used to see  Dr Rolena Infante, Dr Nelva Bush now seen at [facility] Migraine headaches -- on topamax, baclofen, per Dr Delice Lesch Sickle cell trait CP: cardiac cath 2014 -- normal  PLAN: DM: Not well controlled per last A1c.  On metformin, Januvia.  Will check A1c. HTN: Started losartan recently, BPs seem very good, last BMP satisfactory Dyslipidemia: Last LDL not at goal (goal: 100), on Lipitor 20 mg, recheck a FLP. Chest pain: As described above, she has DM, HTN, high cholesterol, + family history thus high risk.  CP is  probably anxiety related.  Had a normal cath 5 years ago.  EKG today: Unchanged from previous.  Refer to cards to be sure, further eval?.  If severe pain to go to the ER. Anxiety, insomnia: Due to increased stress at work, we talked about SSRIs and/or sleep meds, elected sleep medication, Rx Ambien 10 mg 1 or  1/2 tablet   at night.  Call if no better RTC 3 months   Today, in addition to her physical exam I spent more than 25   min with the patient: >50% of the time counseling regards chest pain, DM, HTN.  I reviewed the chart, I explained to her the rationale behind sending her to cardiology (despite atypical presentation of chest pain she is high risk thus  may need further eval.)

## 2017-08-02 LAB — ALT: ALT: 9 U/L (ref 0–35)

## 2017-08-02 LAB — LIPID PANEL
Cholesterol: 239 mg/dL — ABNORMAL HIGH (ref 0–200)
HDL: 59.8 mg/dL (ref >39.00)
LDL Cholesterol: 157 mg/dL — ABNORMAL HIGH (ref 0–99)
NonHDL: 179.45
Total CHOL/HDL Ratio: 4
Triglycerides: 110 mg/dL (ref 0.0–149.0)
VLDL: 22 mg/dL (ref 0.0–40.0)

## 2017-08-02 LAB — AST: AST: 13 U/L (ref 0–37)

## 2017-08-02 LAB — TSH: TSH: 0.97 u[IU]/mL (ref 0.35–4.50)

## 2017-08-02 LAB — HEMOGLOBIN A1C: Hgb A1c MFr Bld: 8.6 % — ABNORMAL HIGH (ref 4.6–6.5)

## 2017-08-02 NOTE — Assessment & Plan Note (Signed)
DM: Not well controlled per last A1c.  On metformin, Januvia.  Will check A1c. HTN: Started losartan recently, BPs seem very good, last BMP satisfactory Dyslipidemia: Last LDL not at goal (goal: 100), on Lipitor 20 mg, recheck a FLP. Chest pain: As described above, she has DM, HTN, high cholesterol, + family history thus high risk.  CP is  probably anxiety related.  Had a normal cath 5 years ago.  EKG today: Unchanged from previous.  Refer to cards to be sure, further eval?.  If severe pain to go to the ER. Anxiety, insomnia: Due to increased stress at work, we talked about SSRIs and/or sleep meds, elected sleep medication, Rx Ambien 10 mg 1 or  1/2 tablet   at night.  Call if no better RTC 3 months

## 2017-08-06 ENCOUNTER — Telehealth: Payer: Self-pay

## 2017-08-06 MED ORDER — CANAGLIFLOZIN 300 MG PO TABS: 300.0000 mg | ORAL_TABLET | Freq: Every day | ORAL | 3 refills | Status: DC

## 2017-08-06 MED ORDER — ATORVASTATIN CALCIUM 40 MG PO TABS: 40.0000 mg | ORAL_TABLET | Freq: Every day | ORAL | 3 refills | Status: DC

## 2017-08-06 MED ORDER — CANAGLIFLOZIN 100 MG PO TABS: 100.0000 mg | ORAL_TABLET | Freq: Every day | ORAL | 3 refills | Status: DC

## 2017-08-06 NOTE — Addendum Note (Signed)
Addended byDamita Dunnings D on: 08/06/2017 11:10 AM   Modules accepted: Orders

## 2017-08-06 NOTE — Telephone Encounter (Signed)
PA initiated via Covermymeds; KEY: [id number]. Received real-time PA approval.   HYIFOY:[id number];MVEHMC:[id number];Review Type:Prior Auth;Coverage Start Date:07/07/2017;Coverage End Date:08/06/2018;

## 2017-08-06 NOTE — Telephone Encounter (Signed)
Received PA for Invokana 300mg - realized that Pt was supposed to be on Invokana 100mg - spoke w/ Marden Noble at [facility] informed him of mistake and re-sent Rx for Invokana 100mg  1 tab daily.

## 2017-08-27 ENCOUNTER — Other Ambulatory Visit: Payer: Self-pay

## 2017-08-27 ENCOUNTER — Encounter: Payer: Self-pay | Admitting: Neurology

## 2017-08-27 ENCOUNTER — Ambulatory Visit: Payer: PRIVATE HEALTH INSURANCE | Admitting: Neurology

## 2017-08-27 VITALS — BP 188/110 | HR 90 | Ht 66.0 in | Wt 309.0 lb

## 2017-08-27 DIAGNOSIS — G43109 Migraine with aura, not intractable, without status migrainosus: Secondary | ICD-10-CM | POA: Diagnosis not present

## 2017-08-27 MED ORDER — TOPIRAMATE 50 MG PO TABS: ORAL_TABLET | ORAL | 3 refills | Status: DC

## 2017-08-27 MED ORDER — BACLOFEN 10 MG PO TABS: ORAL_TABLET | ORAL | 11 refills | Status: DC

## 2017-08-27 MED ORDER — GABAPENTIN 300 MG PO CAPS: ORAL_CAPSULE | ORAL | 11 refills | Status: DC

## 2017-08-27 NOTE — Patient Instructions (Signed)
1. Continue all your medications 2. Refills for baclofen were sent. Do not use more than 2-3 times a week 3. Discuss switching to a different BP medication with Dr. Larose Kells to medication that also can potentially help with migraines 4. Keep headache calendar 5. Follow-up in 4-5 months, call for any changes

## 2017-08-27 NOTE — Progress Notes (Signed)
NEUROLOGY FOLLOW UP OFFICE NOTE  [name] [id number]  DOB: 10-Oct-1965  HISTORY OF PRESENT ILLNESS: I had the pleasure of seeing [name] in follow-up in the neurology clinic on 08/27/2017.  The patient was last seen 6 months ago for worsening migraines. 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 On her last visit, she was continued on Topamax 50mg  in AM, 100mg  in PM, and gabapentin dose was increased to 900mg  qhs. She reports migraines occurring around twice a week. The gabapentin is helping her sleep. She ran out of baclofen, which she was taking as needed when migraines were worse. She has neck and back pain, and is having more muscle spasms. PT did not help with neck pain. She reports stress at work and offensive odors are migraine triggers. She has a mild migraine today with sinus pressure. Her BP is again elevated today, 188/110. At home her highest reading is 147/111, she usually runs at 129/97. She has dizziness when standing up quickly. No vision changes, no falls. She has some left leg numbness/tingling from her sciatica.   HPI: This is a pleasant 52 yo RH woman with a history of morbid obesity, hypertension, diabetes, chronic back pain, and migraines since age 11, who presented for worsening migraines. Migraines usually start in the back of her head then radiate to the frontal regions, with 10/10 sharp, stabbing, and throbbing pain. There is associated nausea, vomiting, photo and phonophobia, neck pain, as well as occasional dizziness and blurred vision. At times, migraines are preceded by flashing lights. She was having them once a month, however since May 2016, headaches increased in frequency to twice a week. She had found that they would occur more a day or two prior to her cycle. Since April, she  has noticed a change in her menstrual period, she had a hysterectomy last July 2016. Other triggers include stress and weather changes. She has been taking Topamax for the past 10 years, increased to 50mg  daily last year. She had been seeing neurologist Dr. Domingo Cocking at [facility] until they stopped taking her insurance. She had been receiving trigger point injections in the past, which did help with headaches, but after one session where she had severe neck pain, she stopped this. She does not recall other preventatives tried, but recalls they made her sleep (she mentions ?Valium). Imitrex helped little. Dr. Domingo Cocking had prescribed prn Baclofen for migraine rescue, which takes the edge off. She takes Tramadol for her left sciatica. She tries to get 8 hours of sleep. There is no family history of migraines. She denies any significant head injuries.   I personally reviewed head CT done 09/2011 which was normal.  She had an MRI C-spine done in December with Ortho which showed post-surgical changes with mild cord atrophy at that level. 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   PAST MEDICAL HISTORY: Past Medical History:  Diagnosis Date  . Anxiety state, unspecified 05/16/2013  . Arthritis    "right knee" (08/27/2013)  . Asthma    "only when I get a sinus infection which is 1-2 X/yr" (08/27/2013)  .  Chronic pain syndrome   . Diabetes ([place])    "borderline" (08/27/2013)  . Failed back syndrome, cervical   . GERD (gastroesophageal reflux disease)   . Hypertension    no meds  . Lumbar radiculopathy, chronic   . Migraine    "once or twice/month now" (08/27/2013)  . Neck pain    axial, s/p cervical fusion C5-6, C6-7   . Obesity   . Sickle cell trait ([place])   . Wears glasses      MEDICATIONS: Current Outpatient Medications on File Prior to Visit  Medication Sig Dispense Refill  . albuterol (VENTOLIN HFA) 108 (90 Base) MCG/ACT inhaler Inhale 2 puffs into the lungs every 6 (six) hours as needed for wheezing or shortness of breath. 1 Inhaler 1  . Ascorbic Acid (VITAMIN C) 100 MG tablet Take 100 mg by mouth 2 (two) times daily.     Marland Kitchen aspirin EC 81 MG tablet Take 81 mg by mouth daily.    Marland Kitchen atorvastatin (LIPITOR) 40 MG tablet Take 1 tablet (40 mg total) by mouth at bedtime. 30 tablet 3  . baclofen (LIORESAL) 10 MG tablet Take 1 tablet as needed at onset of migraine. 20 each 11  . budesonide-formoterol (SYMBICORT) 160-4.5 MCG/ACT inhaler Inhale 2 puffs into the lungs 2 (two) times daily. (Patient taking differently: Inhale 2 puffs into the lungs 2 (two) times daily as needed (shortness of breath). ) 1 Inhaler 3  . canagliflozin (INVOKANA) 100 MG TABS tablet Take 1 tablet (100 mg total) by mouth daily before breakfast. 30 tablet 3  . fexofenadine (ALLEGRA) 180 MG tablet Take 180 mg by mouth daily.    . folic acid (FOLVITE) 1 MG tablet Take 1 mg by mouth daily.    Marland Kitchen gabapentin (NEURONTIN) 300 MG capsule Take 2 capsules (600 mg total) by mouth at bedtime.    Marland Kitchen losartan (COZAAR) 50 MG tablet Take 1 tablet (50 mg total) by mouth daily. 30 tablet 6  . metFORMIN (GLUCOPHAGE) 1000 MG tablet Take 1.5 tablets by mouth every morning and 1 tablet by mouth every evening 75 tablet 5  . naproxen (NAPROSYN) 375 MG tablet Take 1 tablet (375 mg total) by mouth 2 (two) times daily. 20 tablet 0  . sitaGLIPtin (JANUVIA) 100 MG tablet Take 1 tablet (100 mg total) by mouth daily. 30 tablet 6  . topiramate (TOPAMAX) 50 MG tablet Take 1 tablet in AM, 2 tablets in PM 270 tablet 3  . vitamin B-12 (CYANOCOBALAMIN) 500 MCG tablet Take 1,000 mcg by mouth daily.     Marland Kitchen zolpidem (AMBIEN) 10 MG tablet Take 0.5-1 tablets (5-10 mg total) by mouth at bedtime as needed for sleep. 30 tablet 0   No current  facility-administered medications on file prior to visit.     ALLERGIES: Allergies  Allergen Reactions  . Codeine     Facial rash , lip swelling   . Vicodin [Hydrocodone-Acetaminophen] Swelling and Rash    Pt is able to take percocet without problems    FAMILY HISTORY: Family History  Problem Relation Age of Onset  . Breast cancer Other         GM  . Diabetes Father        F, B, S  . CAD Father        died of MI @ 65  . Kidney disease Father   . Pancreatic cancer Other        GM  . CAD Sister  MI at age 12  . CAD Brother        CHF, died at age 58  . Heart attack Brother        died suddenly @ 40  . Leukemia Sister   . Colon cancer Neg Hx     SOCIAL HISTORY: Social History   Socioeconomic History  . Marital status: Married    Spouse name: Not on file  . Number of children: 2  . Years of education: Not on file  . Highest education level: Not on file  Social Needs  . Financial resource strain: Not on file  . Food insecurity - worry: Not on file  . Food insecurity - inability: Not on file  . Transportation needs - medical: Not on file  . Transportation needs - non-medical: Not on file  Occupational History  . Occupation: works , Insurance underwriter: [employer]  Tobacco Use  . Smoking status: Never Smoker  . Smokeless tobacco: Never Used  Substance and Sexual Activity  . Alcohol use: No    Alcohol/week: 0.0 oz  . Drug use: No  . Sexual activity: Not Currently    Birth control/protection: None  Other Topics Concern  . Not on file  Social History Narrative   Lives in [place] , married 05-2017   2 adult children        REVIEW OF SYSTEMS: Constitutional: No fevers, chills, or sweats, no generalized fatigue, change in appetite Eyes: No visual changes, double vision, eye pain Ear, nose and throat: No hearing loss, ear pain, nasal congestion, sore throat Cardiovascular: No chest pain, palpitations Respiratory:  No shortness of breath at rest or with  exertion, wheezes GastrointestinaI: No nausea, vomiting, diarrhea, abdominal pain, fecal incontinence Genitourinary:  No dysuria, urinary retention or frequency Musculoskeletal:  + neck pain, back pain Integumentary: No rash, pruritus, skin lesions Neurological: as above Psychiatric: No depression, +insomnia, anxiety Endocrine: No palpitations, fatigue, diaphoresis, mood swings, change in appetite, change in weight, increased thirst Hematologic/Lymphatic:  No anemia, purpura, petechiae. Allergic/Immunologic: no itchy/runny eyes, nasal congestion, recent allergic reactions, rashes  PHYSICAL EXAM: Vitals:   08/27/17 1146  BP: (!) 188/110  Pulse: 90  SpO2: 95%   General: No acute distress Head:  Normocephalic/atraumatic Neck: supple, + paraspinal tenderness, full range of motion Heart:  Regular rate and rhythm Lungs:  Clear to auscultation bilaterally Back: No paraspinal tenderness Skin/Extremities: No rash, no edema Neurological Exam: alert and oriented to person, place, and time. No aphasia or dysarthria. Fund of knowledge is appropriate.  Recent and remote memory are intact.Attention and concentration are normal.    Able to name objects and repeat phrases. Cranial nerves: Pupils equal, round, reactive to light.  Fundoscopic exam unremarkable, no papilledema. Extraocular movements intact with no nystagmus. Visual fields full. Facial sensation intact. No facial asymmetry. Tongue, uvula, palate midline.  Motor: Bulk and tone normal, muscle strength 5/5 throughout with no pronator drift.  Sensation to light touch intact.  No extinction to double simultaneous stimulation.  Deep tendon reflexes 2+ throughout, toes downgoing.  Finger to nose testing intact.  Gait narrow-based and steady, mild difficulty with tandem walk. No ataxia.  IMPRESSION: This is a pleasant 52 yo RH woman with a history of morbid obesity, hypertension, diabetes, chronic back pain, and migraines since age 15. She had an  initial good response to low dose gabapentin, was lost to follow-up as she was near-migraine-free for a year until she started back working last May 2018. She  is taking Topamax 50mg  in AM, 100mg  in PM and Gabapentin 900mg  qhs. Sleep is getting better. She reports around 2 migraines a week. Her BP is again elevated today, similar to prior visit. We discussed that some BP medications can help with migraines, such as beta blockers. She will discuss with her PCP if switching to a different BP medication that also helps with migraines can be considered. Refills for prn baclofen sent. She will continue to keep a headache calendar and follow-up in 4-5 months.   Thank you for allowing me to participate in her care.  Please do not hesitate to call for any questions or concerns.  The duration of this appointment visit was 25 minutes of face-to-face time with the patient.  Greater than 50% of this time was spent in counseling, explanation of diagnosis, planning of further management, and coordination of care.   Ellouise Newer, M.D.   CC: Dr. Larose Kells

## 2017-10-30 ENCOUNTER — Ambulatory Visit: Payer: BLUE CROSS/BLUE SHIELD | Admitting: Internal Medicine

## 2017-11-02 ENCOUNTER — Ambulatory Visit (INDEPENDENT_AMBULATORY_CARE_PROVIDER_SITE_OTHER): Payer: Self-pay | Admitting: Internal Medicine

## 2017-11-02 ENCOUNTER — Encounter: Payer: Self-pay | Admitting: Internal Medicine

## 2017-11-02 VITALS — BP 116/76 | HR 91 | Temp 97.6°F | Resp 16 | Ht 66.0 in | Wt 297.2 lb

## 2017-11-02 DIAGNOSIS — E785 Hyperlipidemia, unspecified: Secondary | ICD-10-CM

## 2017-11-02 DIAGNOSIS — I10 Essential (primary) hypertension: Secondary | ICD-10-CM

## 2017-11-02 DIAGNOSIS — E119 Type 2 diabetes mellitus without complications: Secondary | ICD-10-CM

## 2017-11-02 NOTE — Patient Instructions (Signed)
  GO TO THE FRONT DESK Schedule your next appointment for a  Physical exam in 6 months   Schedule labs to be done next week , fasting

## 2017-11-02 NOTE — Progress Notes (Signed)
Subjective:    Patient ID: [name], female    DOB: 08-07-1965, 52 y.o.   MRN: [id number]  DOS:  11/02/2017 Type of visit - description : rov Interval history: DM: Good compliance to medication, ambulatory CBGs in the 140s when checked HTN: Good compliance with medication, ambulatory BPs in the 120s Obesity, morbid: Doing great, following low-carb diet, has lost several pounds. 1 week history of feet and ankle edema, worse at the end of the day.  Denies taking excessive salt, not taking naproxen frequently. Headaches: Note from neurology reviewed Chest pain, see last visit did not see cardiology, symptoms resolved  Wt Readings from Last 3 Encounters:  11/02/17 297 lb 4 oz (134.8 kg)  08/27/17 (!) 309 lb (140.2 kg)  08/01/17 (!) 305 lb 6 oz (138.5 kg)     Review of Systems No chest pain, difficulty breathing or palpitations No nausea, vomiting, diarrhea. No genital rash since she started Invokana  Past Medical History:  Diagnosis Date  . Anxiety state, unspecified 05/16/2013  . Arthritis    "right knee" (08/27/2013)  . Asthma    "only when I get a sinus infection which is 1-2 X/yr" (08/27/2013)  . Chronic pain syndrome   . Diabetes ([place])    "borderline" (08/27/2013)  . Failed back syndrome, cervical   . GERD (gastroesophageal reflux disease)   . Hypertension    no meds  . Lumbar radiculopathy, chronic   . Migraine    "once or twice/month now" (08/27/2013)  . Neck pain    axial, s/p cervical fusion C5-6, C6-7   . Obesity   . Sickle cell trait ([place])   . Wears glasses     Past Surgical History:  Procedure Laterality Date  . ANTERIOR CERVICAL DECOMP/DISCECTOMY FUSION N/A 08/27/2013   Procedure: ANTERIOR CERVICAL /DISCECTOMY FUSION (ACDF) C5-C7  2 LEVELS;  Surgeon: Melina Schools, MD;  Location: [facility];  Service: Orthopedics;  Laterality: N/A;  . BREAST REDUCTION SURGERY Bilateral 09/28/2014   Procedure: MAMMARY REDUCTION  (BREAST) BILATERAL;  Surgeon: Cristine Polio,  MD;  Location: [facility];  Service: Plastics;  Laterality: Bilateral;  . CHOLECYSTECTOMY  06/1989  . DILATION AND CURETTAGE OF UTERUS    . HERNIA REPAIR    . KNEE ARTHROSCOPY Left 08/2003  . LAPAROSCOPIC BILATERAL SALPINGECTOMY Bilateral 01/22/2015   Procedure: LAPAROSCOPIC BILATERAL SALPINGECTOMY;  Surgeon: Azucena Fallen, MD;  Location: [facility];  Service: Gynecology;  Laterality: Bilateral;  . LEFT HEART CATHETERIZATION WITH CORONARY ANGIOGRAM N/A 04/15/2013   Procedure: LEFT HEART CATHETERIZATION WITH CORONARY ANGIOGRAM;  Surgeon: Peter M Martinique, MD;  Location: [facility] CATH [facility];  Service: Cardiovascular;  Laterality: N/A;  . LIPOSUCTION Bilateral 09/28/2014   Procedure: LIPOSUCTION;  Surgeon: Cristine Polio, MD;  Location: [facility];  Service: Plastics;  Laterality: Bilateral;  . ROBOTIC ASSISTED TOTAL HYSTERECTOMY N/A 01/22/2015   has her ovaries---ROBOTIC ASSISTED TOTAL HYSTERECTOMY With Pelvic Washings, Lysis of Adhesions;  Surgeon: Azucena Fallen, MD;  Location: [facility];  Service: Gynecology;  Laterality: N/A;  . SHOULDER ARTHROSCOPY W/ ROTATOR CUFF REPAIR Right 2006, 2007  . TUBAL LIGATION  1991  . UMBILICAL HERNIA REPAIR  2001, 2003  . [place] EXTRACTION  1985    Social History   Socioeconomic History  . Marital status: Married    Spouse name: Not on file  . Number of children: 2  . Years of education: Not on file  . Highest education level: Not on file  Occupational History  .  Occupation: works , Insurance underwriter: [employer]  Social Needs  . Financial resource strain: Not on file  . Food insecurity:    Worry: Not on file    Inability: Not on file  . Transportation needs:    Medical: Not on file    Non-medical: Not on file  Tobacco Use  . Smoking status: Never Smoker  . Smokeless tobacco: Never Used  Substance and Sexual Activity  . Alcohol use: No    Alcohol/week: 0.0 oz  . Drug use: No  . Sexual activity: Not Currently    Birth  control/protection: None  Lifestyle  . Physical activity:    Days per week: Not on file    Minutes per session: Not on file  . Stress: Not on file  Relationships  . Social connections:    Talks on phone: Not on file    Gets together: Not on file    Attends religious service: Not on file    Active member of club or organization: Not on file    Attends meetings of clubs or organizations: Not on file    Relationship status: Not on file  . Intimate partner violence:    Fear of current or ex partner: Not on file    Emotionally abused: Not on file    Physically abused: Not on file    Forced sexual activity: Not on file  Other Topics Concern  . Not on file  Social History Narrative   Lives in [place] , married 05-2017   2 adult children          Allergies as of 11/02/2017      Reactions   Codeine    Facial rash , lip swelling    Vicodin [hydrocodone-acetaminophen] Swelling, Rash   Pt is able to take percocet without problems      Medication List        Accurate as of 11/02/17 11:59 PM. Always use your most recent med list.          albuterol 108 (90 Base) MCG/ACT inhaler Commonly known as:  VENTOLIN HFA Inhale 2 puffs into the lungs every 6 (six) hours as needed for wheezing or shortness of breath.   aspirin EC 81 MG tablet Take 81 mg by mouth daily.   atorvastatin 40 MG tablet Commonly known as:  LIPITOR Take 1 tablet (40 mg total) by mouth at bedtime.   baclofen 10 MG tablet Commonly known as:  LIORESAL Take 1 tablet as needed at onset of migraine.   budesonide-formoterol 160-4.5 MCG/ACT inhaler Commonly known as:  SYMBICORT Inhale 2 puffs into the lungs 2 (two) times daily.   canagliflozin 100 MG Tabs tablet Commonly known as:  INVOKANA Take 1 tablet (100 mg total) by mouth daily before breakfast.   fexofenadine 180 MG tablet Commonly known as:  ALLEGRA Take 180 mg by mouth daily.   folic acid 1 MG tablet Commonly known as:  FOLVITE Take 1 mg by mouth  daily.   gabapentin 300 MG capsule Commonly known as:  NEURONTIN Take 3 caps at bedtime   losartan 50 MG tablet Commonly known as:  COZAAR Take 1 tablet (50 mg total) by mouth daily.   metFORMIN 1000 MG tablet Commonly known as:  GLUCOPHAGE Take 1.5 tablets by mouth every morning and 1 tablet by mouth every evening   naproxen 375 MG tablet Commonly known as:  NAPROSYN Take 1 tablet (375 mg total) by mouth 2 (two) times daily.  sitaGLIPtin 100 MG tablet Commonly known as:  JANUVIA Take 1 tablet (100 mg total) by mouth daily.   topiramate 50 MG tablet Commonly known as:  TOPAMAX Take 1 tablet in AM, 2 tablets in PM   vitamin B-12 500 MCG tablet Commonly known as:  CYANOCOBALAMIN Take 1,000 mcg by mouth daily.   vitamin C 100 MG tablet Take 100 mg by mouth 2 (two) times daily.   zolpidem 10 MG tablet Commonly known as:  AMBIEN Take 0.5-1 tablets (5-10 mg total) by mouth at bedtime as needed for sleep.          Objective:   Physical Exam BP 116/76 (BP Location: Left Wrist, Patient Position: Sitting, Cuff Size: Small)   Pulse 91   Temp 97.6 F (36.4 C) (Oral)   Resp 16   Ht 5\' 6"  (1.676 m)   Wt 297 lb 4 oz (134.8 kg)   LMP 01/16/2015   SpO2 97%   BMI 47.98 kg/m  General:   Well developed, orbitally obese appearing. NAD.  HEENT:  Normocephalic . Face symmetric, atraumatic Lungs:  CTA B Normal respiratory effort, no intercostal retractions, no accessory muscle use. Heart: RRR,  no murmur.  Trace pretibial edema bilaterally  Skin: Not pale. Not jaundice Neurologic:  alert & oriented X3.  Speech normal, gait appropriate for age and unassisted Psych--  Cognition and judgment appear intact.  Cooperative with normal attention span and concentration.  Behavior appropriate. No anxious or depressed appearing.      Assessment & Plan:  Assessment  HTN dx 04-2017 DM, no neuropathy Dyslipidemia GERD Asthma-- associated with URIs-allergies   Anxiety Morbid Obesity [facility]: Dr Erlinda Hong, Dr Letta Pate --DJD. Multiple surgeries.  --Chronic back pain, local injections back (  sees  Dr. Ella Bodo) --Neck pain-- used to see  Dr Rolena Infante, Dr Nelva Bush now seen at [facility] Migraine headaches -- on topamax, baclofen, per Dr Delice Lesch Sickle cell trait CP: cardiac cath 2014 -- normal  PLAN: HTN: Currently well controlled with losartan, ambulatory BPs 120s.  Check a CMP DM: Not well controlled, Invokana was added to her regimen 07-2017, she is also on metformin and Januvia.  She is doing great with her diet, has lost more than 10 pounds.  Check a A1c. High cholesterol: Lipitor dose increased based on last cholesterol panel 07-2017, checking labs Morbid obesity: Improving, has lost 10 pounds. Praised! Chest pain: See last visit, no further symptoms, did not see cardiology Headaches: Saw Dr. Delice Lesch 2 months ago, note reviewed, was rec possibly to start beta-blockers for hypertension as a way to help the headaches however headaches are better.  For now will hold-off beta-blockers.   Edema: New issue, mild, not taking excessive salt or NSAIDs.  Recommend leg elevation and observation.  Call if not improving. RTC for labs  next week RTC CPX 6 months

## 2017-11-02 NOTE — Progress Notes (Signed)
Pre visit review using our clinic review tool, if applicable. No additional management support is needed unless otherwise documented below in the visit note. 

## 2017-11-04 NOTE — Assessment & Plan Note (Signed)
HTN: Currently well controlled with losartan, ambulatory BPs 120s.  Check a CMP DM: Not well controlled, Invokana was added to her regimen 07-2017, she is also on metformin and Januvia.  She is doing great with her diet, has lost more than 10 pounds.  Check a A1c. High cholesterol: Lipitor dose increased based on last cholesterol panel 07-2017, checking labs Morbid obesity: Improving, has lost 10 pounds. Praised! Chest pain: See last visit, no further symptoms, did not see cardiology Headaches: Saw Dr. Delice Lesch 2 months ago, note reviewed, was rec possibly to start beta-blockers for hypertension as a way to help the headaches however headaches are better.  For now will hold-off beta-blockers.   Edema: New issue, mild, not taking excessive salt or NSAIDs.  Recommend leg elevation and observation.  Call if not improving. RTC for labs  next week RTC CPX 6 months

## 2017-11-05 ENCOUNTER — Other Ambulatory Visit (INDEPENDENT_AMBULATORY_CARE_PROVIDER_SITE_OTHER): Payer: Self-pay

## 2017-11-05 DIAGNOSIS — E785 Hyperlipidemia, unspecified: Secondary | ICD-10-CM

## 2017-11-05 DIAGNOSIS — I10 Essential (primary) hypertension: Secondary | ICD-10-CM

## 2017-11-05 DIAGNOSIS — E119 Type 2 diabetes mellitus without complications: Secondary | ICD-10-CM

## 2017-11-05 LAB — COMPREHENSIVE METABOLIC PANEL
ALT: 8 U/L (ref 0–35)
AST: 11 U/L (ref 0–37)
Albumin: 3.7 g/dL (ref 3.5–5.2)
Alkaline Phosphatase: 65 U/L (ref 39–117)
BUN: 10 mg/dL (ref 6–23)
CO2: 28 mEq/L (ref 19–32)
Calcium: 9.1 mg/dL (ref 8.4–10.5)
Chloride: 104 mEq/L (ref 96–112)
Creatinine, Ser: 0.69 mg/dL (ref 0.40–1.20)
GFR: 115.27 mL/min (ref >60.00)
Glucose, Bld: 140 mg/dL — ABNORMAL HIGH (ref 70–99)
Potassium: 3.5 mEq/L (ref 3.5–5.1)
Sodium: 140 mEq/L (ref 135–145)
Total Bilirubin: 0.4 mg/dL (ref 0.2–1.2)
Total Protein: 7.4 g/dL (ref 6.0–8.3)

## 2017-11-05 LAB — LIPID PANEL
Cholesterol: 212 mg/dL — ABNORMAL HIGH (ref 0–200)
HDL: 54 mg/dL (ref >39.00)
LDL Cholesterol: 141 mg/dL — ABNORMAL HIGH (ref 0–99)
NonHDL: 157.78
Total CHOL/HDL Ratio: 4
Triglycerides: 85 mg/dL (ref 0.0–149.0)
VLDL: 17 mg/dL (ref 0.0–40.0)

## 2017-11-05 LAB — HEMOGLOBIN A1C: Hgb A1c MFr Bld: 7.4 % — ABNORMAL HIGH (ref 4.6–6.5)

## 2017-11-09 MED ORDER — ATORVASTATIN CALCIUM 80 MG PO TABS: 80.0000 mg | ORAL_TABLET | Freq: Every day | ORAL | 6 refills | Status: DC

## 2017-11-09 NOTE — Addendum Note (Signed)
Addended byDamita Dunnings D on: 11/09/2017 03:38 PM   Modules accepted: Orders

## 2017-12-16 IMAGING — DX DG CHEST 2V
2 series · 2 of 2 positions shown · non-contrast
Comparison: 04/22/2013

CLINICAL DATA: Chest tightness and right-sided paresthesias, onset
tonight

EXAM:
CHEST  2 VIEW

[chest pa]
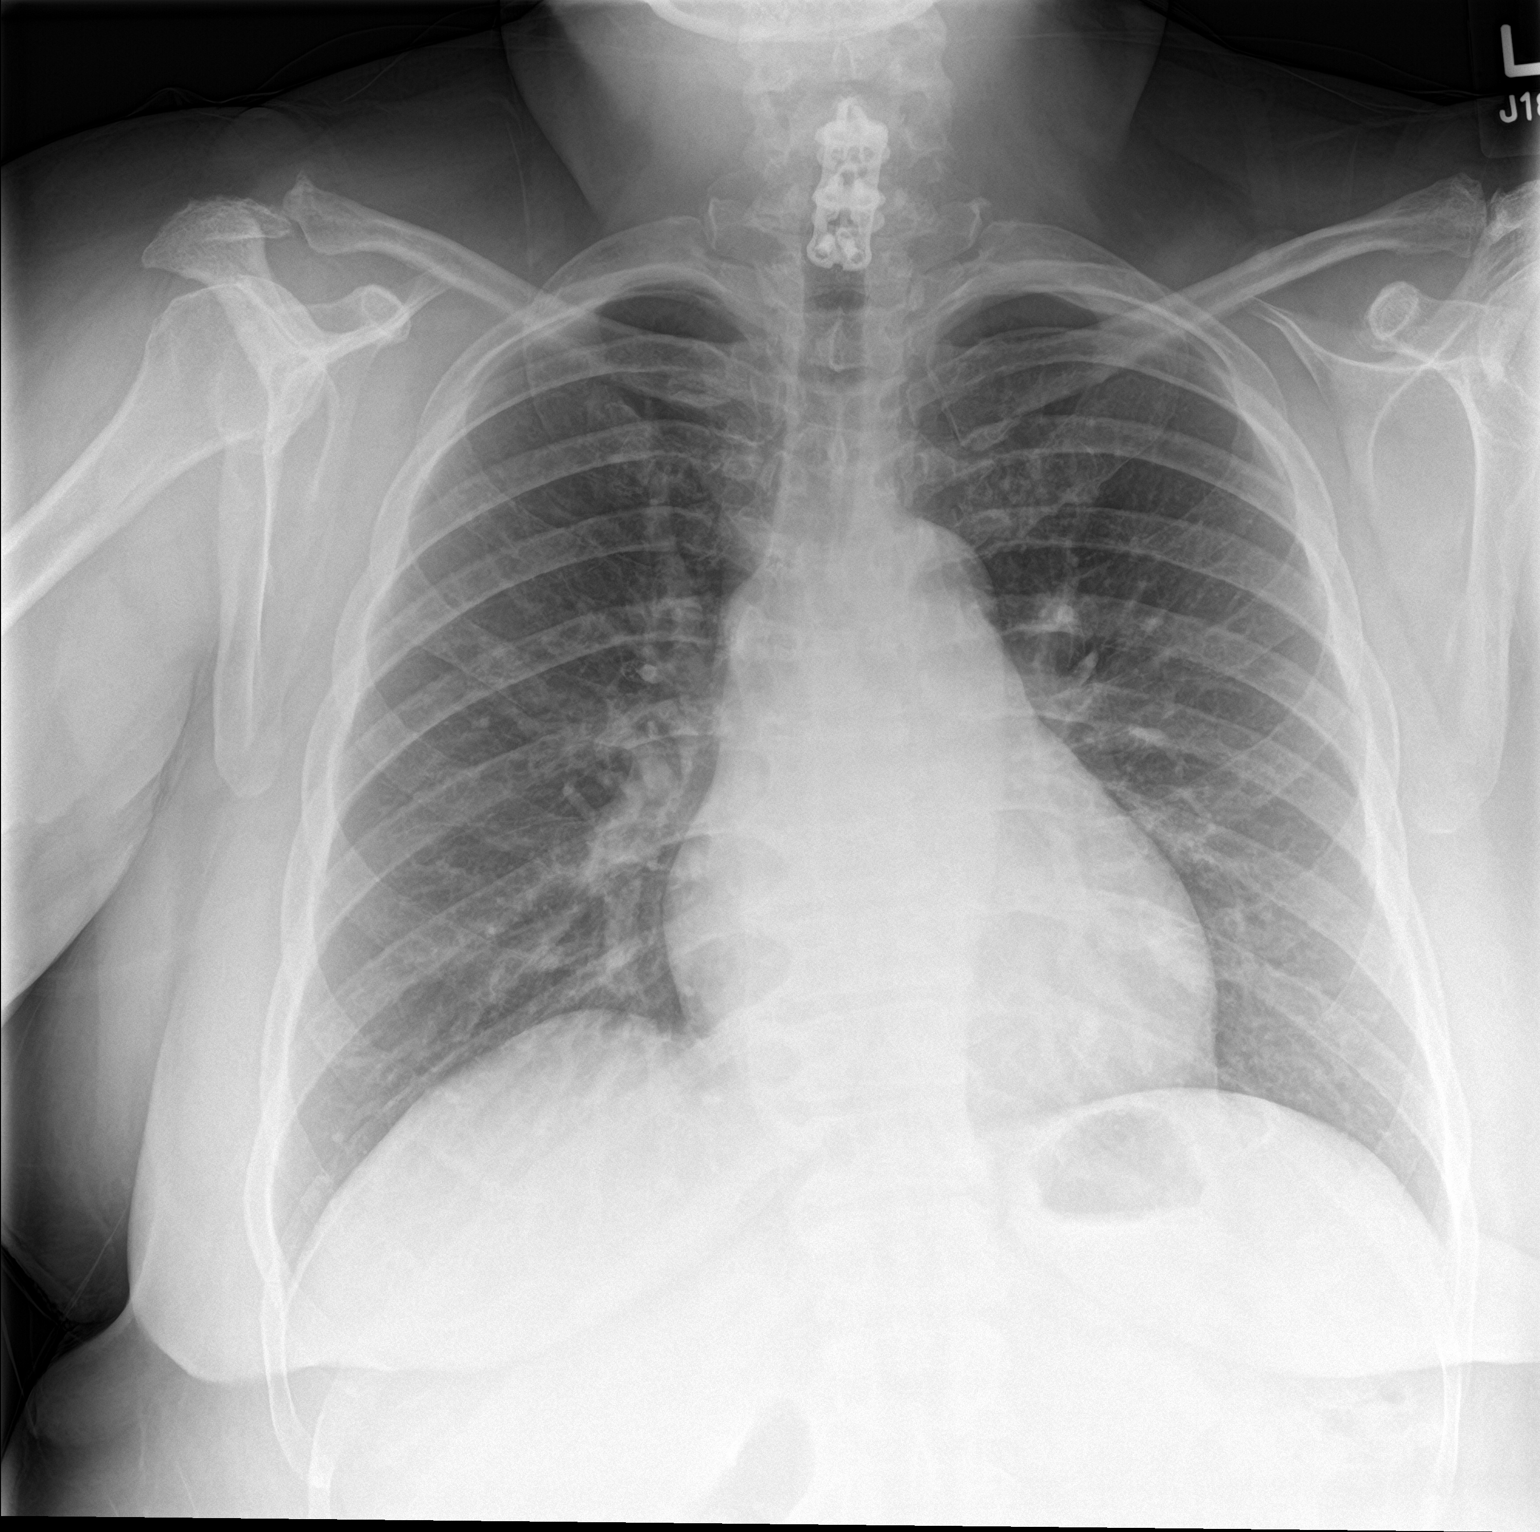

[chest lat]
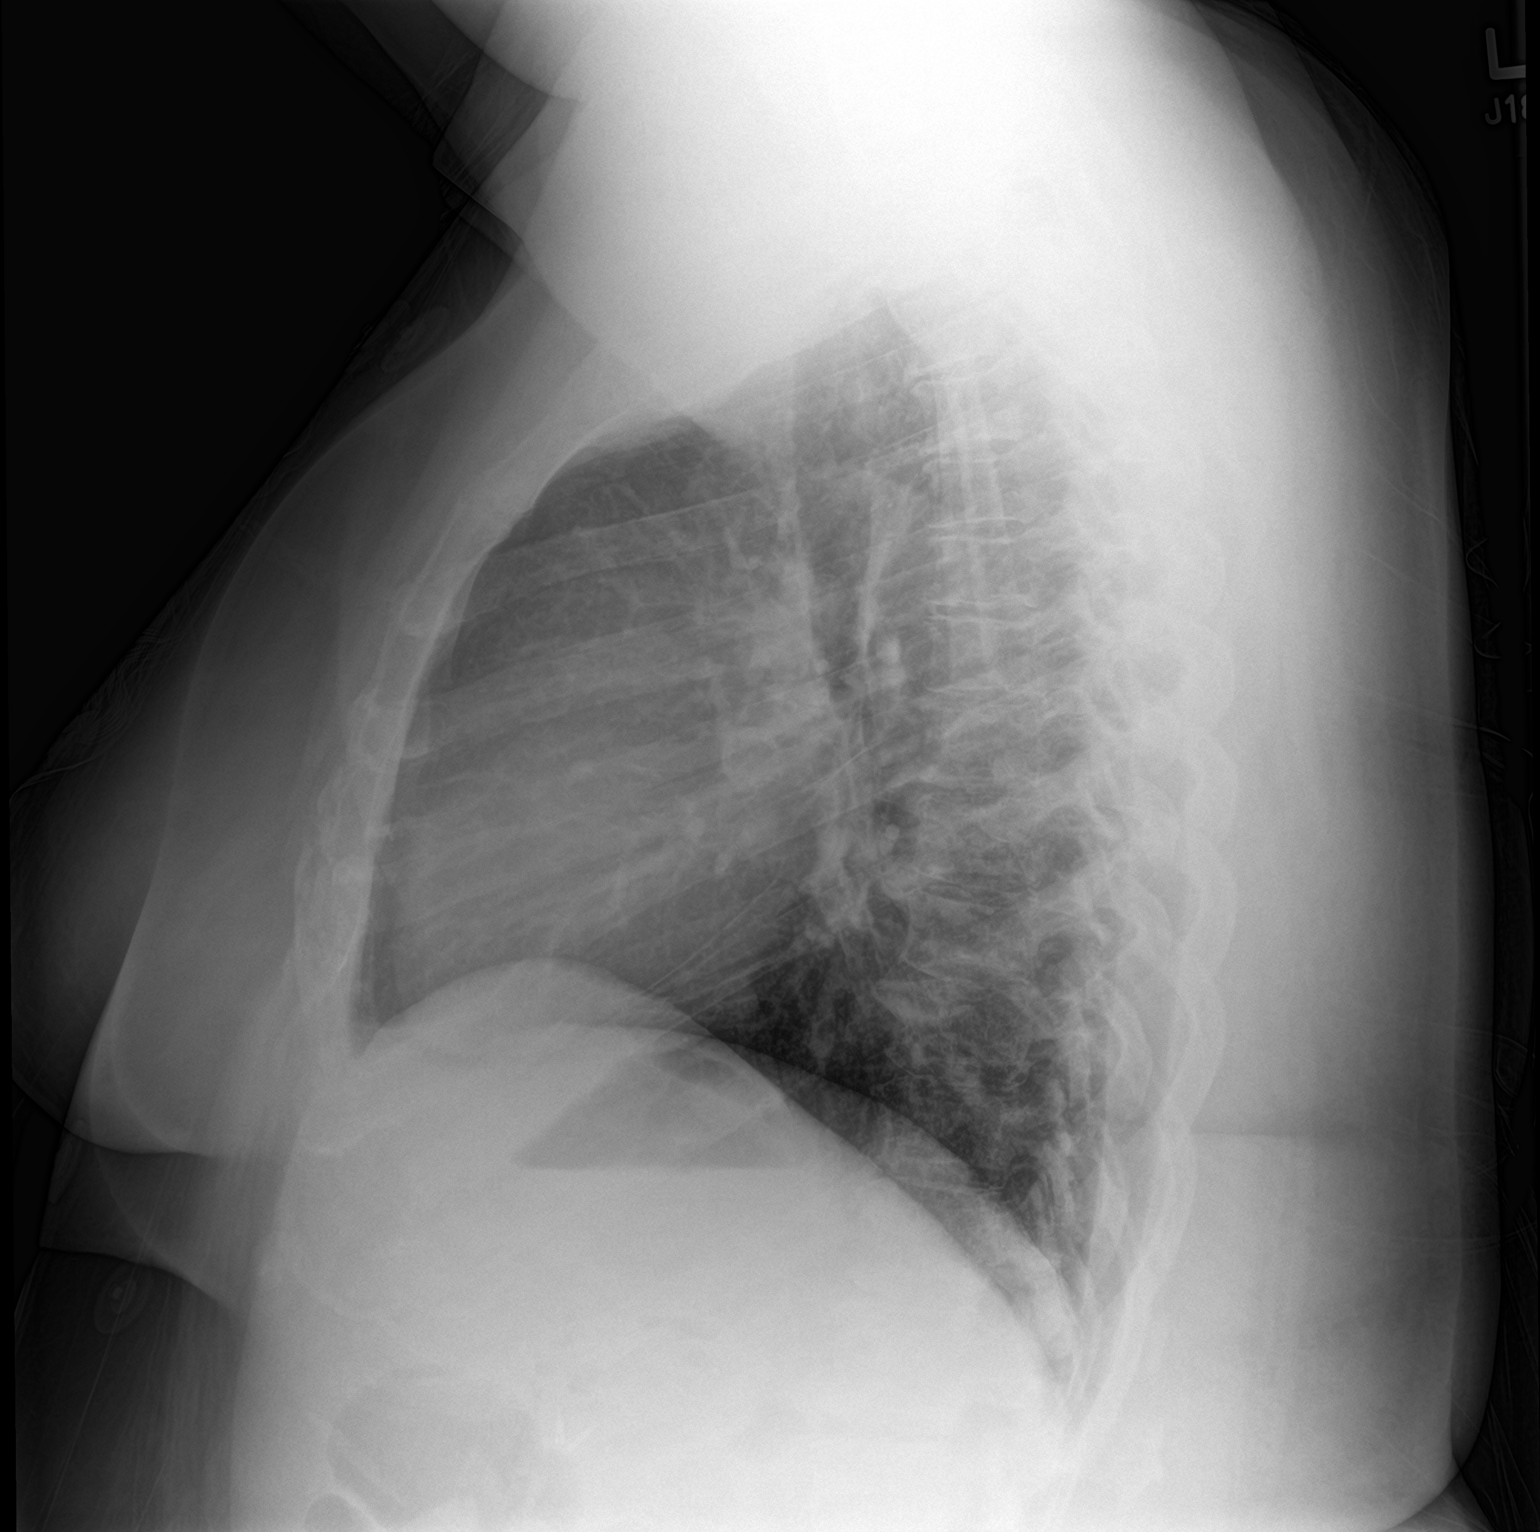

[2 of 2 positions shown; findings below may reference images not displayed]

FINDINGS: Unchanged borderline heart size. The lungs are clear. The pulmonary
vasculature is normal. There is no pleural effusion. Hilar and
mediastinal contours are unremarkable and unchanged.
IMPRESSION: No active cardiopulmonary disease.

## 2018-01-02 ENCOUNTER — Ambulatory Visit: Payer: BLUE CROSS/BLUE SHIELD | Admitting: Neurology

## 2018-01-04 ENCOUNTER — Ambulatory Visit (INDEPENDENT_AMBULATORY_CARE_PROVIDER_SITE_OTHER): Payer: BLUE CROSS/BLUE SHIELD | Admitting: Neurology

## 2018-01-04 ENCOUNTER — Other Ambulatory Visit: Payer: Self-pay

## 2018-01-04 ENCOUNTER — Encounter: Payer: Self-pay | Admitting: Neurology

## 2018-01-04 VITALS — BP 162/102 | HR 77 | Ht 66.5 in | Wt 289.0 lb

## 2018-01-04 DIAGNOSIS — G43109 Migraine with aura, not intractable, without status migrainosus: Secondary | ICD-10-CM | POA: Diagnosis not present

## 2018-01-04 MED ORDER — GABAPENTIN 300 MG PO CAPS: ORAL_CAPSULE | ORAL | 3 refills | Status: DC

## 2018-01-04 MED ORDER — BACLOFEN 10 MG PO TABS: ORAL_TABLET | ORAL | 11 refills | Status: DC

## 2018-01-04 MED ORDER — TOPIRAMATE 50 MG PO TABS: ORAL_TABLET | ORAL | 3 refills | Status: DC

## 2018-01-04 NOTE — Progress Notes (Signed)
NEUROLOGY FOLLOW UP OFFICE NOTE  [name] [id number]  DOB: 10/18/1965  HISTORY OF PRESENT ILLNESS: I had the pleasure of seeing [name] in follow-up in the neurology clinic on 01/04/2018.  The patient was last seen 4 months ago for worsening migraines. On her last visit, she reported doing very well until she started working and migraines increased. She had a significant improvement on Topamax 50mg  in AM, 100mg  in PM and Gabapentin 900mg  qhs. She was having migraines less frequently until the weather started warming up. Hot weather is a clear trigger for headaches, she gets into her hot car at the end of work and the heat hits her hard, making her eyes water, then headaches start. They can last 1-2 days. Her house is kept at 67 degrees. She had a bad headache with dizziness the other day, lost her balance and hit her head on the right temporal region on her car. No loss of consciousness. She uses prn baclofen for migraines. She has neck and back pain. BP is again elevated today 162/102, but was normal with PCP visit recently. Continue to monitor.   HPI: This is a pleasant 52 yo RH woman with a history of morbid obesity, hypertension, diabetes, chronic back pain, and migraines since age 52, who presented for worsening migraines. Migraines usually start in the back of her head then radiate to the frontal regions, with 10/10 sharp, stabbing, and throbbing pain. There is associated nausea, vomiting, photo and phonophobia, neck pain, as well as occasional dizziness and blurred vision. At times, migraines are preceded by flashing lights. She was having them once a month, however since May 2016, headaches increased in frequency to twice a week. She had found that they would occur more a day or two prior to her cycle. Since April, she has noticed a change in her menstrual period, she had a hysterectomy last July 2016. Other triggers include stress and weather changes. She has been taking Topamax for  the past 10 years, increased to 50mg  daily last year. She had been seeing neurologist Dr. Domingo Cocking [facility] until they stopped taking her insurance. She had been receiving trigger point injections in the past, which did help with headaches, but after one session where she had severe neck pain, she stopped this. She does not recall other preventatives tried, but recalls they made her sleep (she mentions ?Valium). Imitrex helped little. Dr. Domingo Cocking had prescribed prn Baclofen for migraine rescue, which takes the edge off. She takes Tramadol for her left sciatica. She tries to get 8 hours of sleep. There is no family history of migraines. She denies any significant head injuries.   I personally reviewed head CT done 09/2011 which was normal.  She had an MRI C-spine done in December with Ortho which showed post-surgical changes with mild cord atrophy at that level. 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   PAST MEDICAL HISTORY: Past Medical History:  Diagnosis Date  . Anxiety state, unspecified 05/16/2013  . Arthritis    "right knee" (08/27/2013)  . Asthma    "only when I get a sinus infection which is 1-2 X/yr" (08/27/2013)  . Chronic pain syndrome   . Diabetes ([name])    "borderline" (08/27/2013)  . Failed back syndrome, cervical   . GERD (gastroesophageal reflux disease)   .  Hypertension    no meds  . Lumbar radiculopathy, chronic   . Migraine    "once or twice/month now" (08/27/2013)  . Neck pain    axial, s/p cervical fusion C5-6, C6-7   . Obesity   . Sickle cell trait ([place])   . Wears glasses     MEDICATIONS: Current Outpatient Medications on File Prior to Visit  Medication Sig Dispense Refill  . albuterol (VENTOLIN HFA) 108 (90 Base) MCG/ACT inhaler  Inhale 2 puffs into the lungs every 6 (six) hours as needed for wheezing or shortness of breath. 1 Inhaler 1  . Ascorbic Acid (VITAMIN C) 100 MG tablet Take 100 mg by mouth 2 (two) times daily.     Marland Kitchen aspirin EC 81 MG tablet Take 81 mg by mouth daily.    Marland Kitchen atorvastatin (LIPITOR) 80 MG tablet Take 1 tablet (80 mg total) by mouth at bedtime. 30 tablet 6  . baclofen (LIORESAL) 10 MG tablet Take 1 tablet as needed at onset of migraine. 20 each 11  . budesonide-formoterol (SYMBICORT) 160-4.5 MCG/ACT inhaler Inhale 2 puffs into the lungs 2 (two) times daily. (Patient taking differently: Inhale 2 puffs into the lungs 2 (two) times daily as needed (shortness of breath). ) 1 Inhaler 3  . canagliflozin (INVOKANA) 100 MG TABS tablet Take 1 tablet (100 mg total) by mouth daily before breakfast. 30 tablet 3  . fexofenadine (ALLEGRA) 180 MG tablet Take 180 mg by mouth daily.    . folic acid (FOLVITE) 1 MG tablet Take 1 mg by mouth daily.    Marland Kitchen gabapentin (NEURONTIN) 300 MG capsule Take 3 caps at bedtime 90 capsule 11  . losartan (COZAAR) 50 MG tablet Take 1 tablet (50 mg total) by mouth daily. 30 tablet 6  . metFORMIN (GLUCOPHAGE) 1000 MG tablet Take 1.5 tablets by mouth every morning and 1 tablet by mouth every evening 75 tablet 5  . naproxen (NAPROSYN) 375 MG tablet Take 1 tablet (375 mg total) by mouth 2 (two) times daily. 20 tablet 0  . sitaGLIPtin (JANUVIA) 100 MG tablet Take 1 tablet (100 mg total) by mouth daily. 30 tablet 6  . topiramate (TOPAMAX) 50 MG tablet Take 1 tablet in AM, 2 tablets in PM 270 tablet 3  . vitamin B-12 (CYANOCOBALAMIN) 500 MCG tablet Take 1,000 mcg by mouth daily.     Marland Kitchen zolpidem (AMBIEN) 10 MG tablet Take 0.5-1 tablets (5-10 mg total) by mouth at bedtime as needed for sleep. 30 tablet 0   No current facility-administered medications on file prior to visit.     ALLERGIES: Allergies  Allergen Reactions  . Codeine     Facial rash , lip swelling   . Vicodin  [Hydrocodone-Acetaminophen] Swelling and Rash    Pt is able to take percocet without problems    FAMILY HISTORY: Family History  Problem Relation Age of Onset  . Breast cancer Other         GM  . Diabetes Father        F, B, S  . CAD Father        died of MI @ 37  . Kidney disease Father   . Pancreatic cancer Other        GM  . CAD Sister        MI at age 54  . CAD Brother        CHF, died at age 2  . Heart attack Brother        died  suddenly @ 37  . Leukemia Sister   . Colon cancer Neg Hx     SOCIAL HISTORY: Social History   Socioeconomic History  . Marital status: Married    Spouse name: Not on file  . Number of children: 2  . Years of education: Not on file  . Highest education level: Not on file  Occupational History  . Occupation: works , Insurance underwriter: [employer]  Social Needs  . Financial resource strain: Not on file  . Food insecurity:    Worry: Not on file    Inability: Not on file  . Transportation needs:    Medical: Not on file    Non-medical: Not on file  Tobacco Use  . Smoking status: Never Smoker  . Smokeless tobacco: Never Used  Substance and Sexual Activity  . Alcohol use: No    Alcohol/week: 0.0 oz  . Drug use: No  . Sexual activity: Not Currently    Birth control/protection: None  Lifestyle  . Physical activity:    Days per week: Not on file    Minutes per session: Not on file  . Stress: Not on file  Relationships  . Social connections:    Talks on phone: Not on file    Gets together: Not on file    Attends religious service: Not on file    Active member of club or organization: Not on file    Attends meetings of clubs or organizations: Not on file    Relationship status: Not on file  . Intimate partner violence:    Fear of current or ex partner: Not on file    Emotionally abused: Not on file    Physically abused: Not on file    Forced sexual activity: Not on file  Other Topics Concern  . Not on file  Social History  Narrative   Lives in [place] , married 05-2017   2 adult children        REVIEW OF SYSTEMS: Constitutional: No fevers, chills, or sweats, no generalized fatigue, change in appetite Eyes: No visual changes, double vision, eye pain Ear, nose and throat: No hearing loss, ear pain, nasal congestion, sore throat Cardiovascular: No chest pain, palpitations Respiratory:  No shortness of breath at rest or with exertion, wheezes GastrointestinaI: No nausea, vomiting, diarrhea, abdominal pain, fecal incontinence Genitourinary:  No dysuria, urinary retention or frequency Musculoskeletal:  + neck pain, back pain Integumentary: No rash, pruritus, skin lesions Neurological: as above Psychiatric: No depression, +insomnia, anxiety Endocrine: No palpitations, fatigue, diaphoresis, mood swings, change in appetite, change in weight, increased thirst Hematologic/Lymphatic:  No anemia, purpura, petechiae. Allergic/Immunologic: no itchy/runny eyes, nasal congestion, recent allergic reactions, rashes  PHYSICAL EXAM: Vitals:   01/04/18 0948  BP: (!) 162/102  Pulse: 77  SpO2: 95%   General: No acute distress Head:  Normocephalic/atraumatic Neck: supple, + paraspinal tenderness, full range of motion Heart:  Regular rate and rhythm Lungs:  Clear to auscultation bilaterally Back: No paraspinal tenderness Skin/Extremities: No rash, no edema Neurological Exam: alert and oriented to person, place, and time. No aphasia or dysarthria. Fund of knowledge is appropriate.  Recent and remote memory are intact.Attention and concentration are normal.    Able to name objects and repeat phrases. Cranial nerves: Pupils equal, round, reactive to light.  Fundoscopic exam unremarkable, no papilledema. Extraocular movements intact with no nystagmus. Visual fields full. Facial sensation intact. No facial asymmetry. Tongue, uvula, palate midline.  Motor: Bulk and tone normal, muscle  strength 5/5 throughout with no pronator drift.   Sensation to light touch intact.  No extinction to double simultaneous stimulation.  Deep tendon reflexes 2+ throughout, toes downgoing.  Finger to nose testing intact.  Gait narrow-based and steady, mild difficulty with tandem walk. No ataxia.  IMPRESSION: This is a pleasant 52 yo RH woman with a history of morbid obesity, hypertension, diabetes, chronic back pain, and migraines since age 18. She had a good response to Topamax 50mg  in AM, 100mg  in PM and Gabapentin 900mg  qhs, until recent weather changes, heat triggers migraines. She would like to continue on current medications for now and see how she does once weather cools down. BP is again elevated today, continue to monitor with PCP. Refills for prn baclofen, Topamax, and Gabapentin sent. She will continue to keep a headache calendar and follow-up in 6 months.   Thank you for allowing me to participate in her care.  Please do not hesitate to call for any questions or concerns.  The duration of this appointment visit was 26 minutes of face-to-face time with the patient.  Greater than 50% of this time was spent in counseling, explanation of diagnosis, planning of further management, and coordination of care.   Ellouise Newer, M.D.   CC: Dr. Larose Kells

## 2018-01-04 NOTE — Patient Instructions (Signed)
Great seeing you. Stay cool! Continue all your medications. Follow-up in 6 months, call for any changes.

## 2018-02-04 ENCOUNTER — Encounter: Payer: Self-pay | Admitting: Internal Medicine

## 2018-05-06 ENCOUNTER — Ambulatory Visit: Payer: Medicaid Other | Admitting: Internal Medicine

## 2018-05-24 ENCOUNTER — Ambulatory Visit (INDEPENDENT_AMBULATORY_CARE_PROVIDER_SITE_OTHER): Payer: Self-pay | Admitting: Internal Medicine

## 2018-05-24 ENCOUNTER — Encounter: Payer: Self-pay | Admitting: Internal Medicine

## 2018-05-24 VITALS — BP 152/100 | HR 92 | Temp 98.1°F | Resp 16 | Ht 67.0 in | Wt 278.2 lb

## 2018-05-24 DIAGNOSIS — E119 Type 2 diabetes mellitus without complications: Secondary | ICD-10-CM

## 2018-05-24 DIAGNOSIS — I10 Essential (primary) hypertension: Secondary | ICD-10-CM

## 2018-05-24 DIAGNOSIS — E785 Hyperlipidemia, unspecified: Secondary | ICD-10-CM

## 2018-05-24 DIAGNOSIS — Z23 Encounter for immunization: Secondary | ICD-10-CM

## 2018-05-24 LAB — LIPID PANEL
Cholesterol: 222 mg/dL — ABNORMAL HIGH (ref 0–200)
HDL: 58.3 mg/dL (ref >39.00)
LDL Cholesterol: 148 mg/dL — ABNORMAL HIGH (ref 0–99)
NonHDL: 163.52
Total CHOL/HDL Ratio: 4
Triglycerides: 79 mg/dL (ref 0.0–149.0)
VLDL: 15.8 mg/dL (ref 0.0–40.0)

## 2018-05-24 LAB — HEMOGLOBIN A1C: Hgb A1c MFr Bld: 7.3 % — ABNORMAL HIGH (ref 4.6–6.5)

## 2018-05-24 LAB — COMPREHENSIVE METABOLIC PANEL
ALT: 9 U/L (ref 0–35)
AST: 10 U/L (ref 0–37)
Albumin: 3.9 g/dL (ref 3.5–5.2)
Alkaline Phosphatase: 75 U/L (ref 39–117)
BUN: 11 mg/dL (ref 6–23)
CO2: 28 mEq/L (ref 19–32)
Calcium: 9.6 mg/dL (ref 8.4–10.5)
Chloride: 104 mEq/L (ref 96–112)
Creatinine, Ser: 0.77 mg/dL (ref 0.40–1.20)
GFR: 101.34 mL/min (ref >60.00)
Glucose, Bld: 132 mg/dL — ABNORMAL HIGH (ref 70–99)
Potassium: 3.9 mEq/L (ref 3.5–5.1)
Sodium: 140 mEq/L (ref 135–145)
Total Bilirubin: 0.5 mg/dL (ref 0.2–1.2)
Total Protein: 7.4 g/dL (ref 6.0–8.3)

## 2018-05-24 MED ORDER — LOSARTAN POTASSIUM 100 MG PO TABS: 100.0000 mg | ORAL_TABLET | Freq: Every day | ORAL | 6 refills | Status: DC

## 2018-05-24 NOTE — Progress Notes (Signed)
Subjective:    Patient ID: [name], female    DOB: 03/12/1966, 52 y.o.   MRN: [id number]  DOS:  05/24/2018 Type of visit - description : rov Interval history: DM: Good med compliance, ambulatory CBGs 130, 140 Morbid obesity: Doing great, walking more, has changed her diet even further. Neck pain: On and off issue, lately exacerbated.  Some radiation to the arms on and off with occasional numbness of the fingers. No lower extremity paresthesia, no gait disturbances. Asthma: Controlled, only uses inhalers as needed. HTN: Apparently not well controlled.  On losartan 50 mg daily.  BP Readings from Last 3 Encounters:  05/24/18 (!) 152/100  01/04/18 (!) 162/102  11/02/17 116/76    Wt Readings from Last 3 Encounters:  05/24/18 278 lb 4 oz (126.2 kg)  01/04/18 289 lb (131.1 kg)  11/02/17 297 lb 4 oz (134.8 kg)    Review of Systems  Denies chest pain no difficulty breathing No nausea, vomiting, diarrhea Past Medical History:  Diagnosis Date  . Anxiety state, unspecified 05/16/2013  . Arthritis    "right knee" (08/27/2013)  . Asthma    "only when I get a sinus infection which is 1-2 X/yr" (08/27/2013)  . Chronic pain syndrome   . Diabetes ([place])    "borderline" (08/27/2013)  . Failed back syndrome, cervical   . GERD (gastroesophageal reflux disease)   . Hypertension    no meds  . Lumbar radiculopathy, chronic   . Migraine    "once or twice/month now" (08/27/2013)  . Neck pain    axial, s/p cervical fusion C5-6, C6-7   . Obesity   . Sickle cell trait ([place])   . Wears glasses     Past Surgical History:  Procedure Laterality Date  . ANTERIOR CERVICAL DECOMP/DISCECTOMY FUSION N/A 08/27/2013   Procedure: ANTERIOR CERVICAL /DISCECTOMY FUSION (ACDF) C5-C7  2 LEVELS;  Surgeon: Melina Schools, MD;  Location: [facility];  Service: Orthopedics;  Laterality: N/A;  . BREAST REDUCTION SURGERY Bilateral 09/28/2014   Procedure: MAMMARY REDUCTION  (BREAST) BILATERAL;  Surgeon: Cristine Polio, MD;  Location: [facility];  Service: Plastics;  Laterality: Bilateral;  . CHOLECYSTECTOMY  06/1989  . DILATION AND CURETTAGE OF UTERUS    . HERNIA REPAIR    . KNEE ARTHROSCOPY Left 08/2003  . LAPAROSCOPIC BILATERAL SALPINGECTOMY Bilateral 01/22/2015   Procedure: LAPAROSCOPIC BILATERAL SALPINGECTOMY;  Surgeon: Azucena Fallen, MD;  Location: [facility];  Service: Gynecology;  Laterality: Bilateral;  . LEFT HEART CATHETERIZATION WITH CORONARY ANGIOGRAM N/A 04/15/2013   Procedure: LEFT HEART CATHETERIZATION WITH CORONARY ANGIOGRAM;  Surgeon: Peter M Martinique, MD;  Location: [facility] CATH LAB;  Service: Cardiovascular;  Laterality: N/A;  . LIPOSUCTION Bilateral 09/28/2014   Procedure: LIPOSUCTION;  Surgeon: Cristine Polio, MD;  Location: [facility];  Service: Plastics;  Laterality: Bilateral;  . ROBOTIC ASSISTED TOTAL HYSTERECTOMY N/A 01/22/2015   has her ovaries---ROBOTIC ASSISTED TOTAL HYSTERECTOMY With Pelvic Washings, Lysis of Adhesions;  Surgeon: Azucena Fallen, MD;  Location: [facility];  Service: Gynecology;  Laterality: N/A;  . SHOULDER ARTHROSCOPY W/ ROTATOR CUFF REPAIR Right 2006, 2007  . TUBAL LIGATION  1991  . UMBILICAL HERNIA REPAIR  2001, 2003  . [place] EXTRACTION  1985    Social History   Socioeconomic History  . Marital status: Married    Spouse name: Not on file  . Number of children: 2  . Years of education: Not on file  . Highest education level: Not on file  Occupational History  . Occupation: works , Insurance underwriter: [employer]  Social Needs  . Financial resource strain: Not on file  . Food insecurity:    Worry: Not on file    Inability: Not on file  . Transportation needs:    Medical: Not on file    Non-medical: Not on file  Tobacco Use  . Smoking status: Never Smoker  . Smokeless tobacco: Never Used  Substance and Sexual Activity  . Alcohol use: No    Alcohol/week: 0.0 standard drinks  . Drug use: No  . Sexual activity: Not  Currently    Birth control/protection: None  Lifestyle  . Physical activity:    Days per week: Not on file    Minutes per session: Not on file  . Stress: Not on file  Relationships  . Social connections:    Talks on phone: Not on file    Gets together: Not on file    Attends religious service: Not on file    Active member of club or organization: Not on file    Attends meetings of clubs or organizations: Not on file    Relationship status: Not on file  . Intimate partner violence:    Fear of current or ex partner: Not on file    Emotionally abused: Not on file    Physically abused: Not on file    Forced sexual activity: Not on file  Other Topics Concern  . Not on file  Social History Narrative   Lives in [place] , married 05-2017   2 adult children          Allergies as of 05/24/2018      Reactions   Codeine    Facial rash , lip swelling    Vicodin [hydrocodone-acetaminophen] Swelling, Rash   Pt is able to take percocet without problems      Medication List        Accurate as of 05/24/18  9:47 AM. Always use your most recent med list.          albuterol 108 (90 Base) MCG/ACT inhaler Commonly known as:  PROVENTIL HFA;VENTOLIN HFA Inhale 2 puffs into the lungs every 6 (six) hours as needed for wheezing or shortness of breath.   aspirin EC 81 MG tablet Take 81 mg by mouth daily.   atorvastatin 80 MG tablet Commonly known as:  LIPITOR Take 1 tablet (80 mg total) by mouth at bedtime.   baclofen 10 MG tablet Commonly known as:  LIORESAL Take 1 tablet as needed at onset of migraine.   budesonide-formoterol 160-4.5 MCG/ACT inhaler Commonly known as:  SYMBICORT Inhale 2 puffs into the lungs 2 (two) times daily.   canagliflozin 100 MG Tabs tablet Commonly known as:  INVOKANA Take 1 tablet (100 mg total) by mouth daily before breakfast.   fexofenadine 180 MG tablet Commonly known as:  ALLEGRA Take 180 mg by mouth daily.   folic acid 1 MG tablet Commonly known  as:  FOLVITE Take 1 mg by mouth daily.   gabapentin 300 MG capsule Commonly known as:  NEURONTIN Take 3 caps at bedtime   losartan 100 MG tablet Commonly known as:  COZAAR Take 1 tablet (100 mg total) by mouth daily.   metFORMIN 1000 MG tablet Commonly known as:  GLUCOPHAGE Take 1.5 tablets by mouth every morning and 1 tablet by mouth every evening   naproxen 375 MG tablet Commonly known as:  NAPROSYN Take 1 tablet (375 mg total)  by mouth 2 (two) times daily.   sitaGLIPtin 100 MG tablet Commonly known as:  JANUVIA Take 1 tablet (100 mg total) by mouth daily.   topiramate 50 MG tablet Commonly known as:  TOPAMAX Take 1 tablet in AM, 2 tablets in PM   vitamin B-12 500 MCG tablet Commonly known as:  CYANOCOBALAMIN Take 1,000 mcg by mouth daily.   vitamin C 100 MG tablet Take 100 mg by mouth 2 (two) times daily.   zolpidem 10 MG tablet Commonly known as:  AMBIEN Take 0.5-1 tablets (5-10 mg total) by mouth at bedtime as needed for sleep.          Objective:   Physical Exam BP (!) 152/100 (BP Location: Left Wrist, Patient Position: Sitting, Cuff Size: Small)   Pulse 92   Temp 98.1 F (36.7 C) (Oral)   Resp 16   Ht 5\' 7"  (1.702 m)   Wt 278 lb 4 oz (126.2 kg)   LMP 01/16/2015   SpO2 97%   BMI 43.58 kg/m   General:   Well developed, NAD, BMI noted. HEENT:  Normocephalic . Face symmetric, atraumatic Lungs:  CTA B Normal respiratory effort, no intercostal retractions, no accessory muscle use. Heart: RRR,  no murmur.  No pretibial edema bilaterally  Skin: Not pale. Not jaundice DIABETIC FEET EXAM: No lower extremity edema Normal pedal pulses bilaterally Skin normal, nails normal, no calluses Pinprick examination of the feet normal. Neurologic:  alert & oriented X3.  Speech normal, gait appropriate for age and unassisted Psych--  Cognition and judgment appear intact.  Cooperative with normal attention span and concentration.  Behavior appropriate. No  anxious or depressed appearing.      Assessment & Plan:    Assessment  HTN dx 04-2017 DM, no neuropathy Dyslipidemia GERD Asthma-- associated with URIs-allergies  Anxiety Morbid Obesity MS[facility]: Dr Erlinda Hong, Dr Letta Pate --DJD. Multiple surgeries.  --Chronic back pain, local injections back (  sees  Dr. Ella Bodo) --Neck pain-- used to see  Dr Rolena Infante, Dr Nelva Bush now seen at [facility] Migraine headaches -- on topamax, baclofen, per Dr Delice Lesch Sickle cell trait CP: cardiac cath 2014 -- normal  PLAN: DM: Currently on Invokana, Januvia, metformin, check a A1c. HTN: On losartan 50 mg, BP today elevated, BP was elevated at neurology few months ago.  Last ambulatory BP 142/100.  Reports she follows a low-salt diet. Plan: Increase losartan to 100 mg, CMP in 2 weeks.  Monitor BPs. High cholesterol: Based on last reading, Lipitor was increased to 80 mg, good compliance.  Check a FLP Migraines: Saw neurology 01/04/2018, she was recommended to continue Topamax, gabapentin.  Also uses baclofen prn Neck pain: On and off/chronic issue, she already called [facility] for visit. Morbid obesity: Doing great, continue with better diet, more active. praised  Preventive care flu shot today RTC  CPX next 3 to 4 months

## 2018-05-24 NOTE — Patient Instructions (Signed)
GO TO THE LAB : Get the blood work    [facility] --Get blood work again in 2 weeks, please make an appointment --Schedule your next appointment for a physical exam in 3 to 4 months  You are doing great with your diet  Needs better BP control, increase losartan to 100 mg Check the  blood pressure   Weekly Be sure your blood pressure is between 110/65 and  135/85. If it is consistently higher or lower, let me know

## 2018-05-24 NOTE — Progress Notes (Signed)
Pre visit review using our clinic review tool, if applicable. No additional management support is needed unless otherwise documented below in the visit note. 

## 2018-05-26 NOTE — Assessment & Plan Note (Signed)
DM: Currently on Invokana, Januvia, metformin, check a A1c. HTN: On losartan 50 mg, BP today elevated, BP was elevated at neurology few months ago.  Last ambulatory BP 142/100.  Reports she follows a low-salt diet. Plan: Increase losartan to 100 mg, CMP in 2 weeks.  Monitor BPs. High cholesterol: Based on last reading, Lipitor was increased to 80 mg, good compliance.  Check a FLP Migraines: Saw neurology 01/04/2018, she was recommended to continue Topamax, gabapentin.  Also uses baclofen prn Neck pain: On and off/chronic issue, she already called [facility] for visit. Morbid obesity: Doing great, continue with better diet, more active. praised  Preventive care flu shot today RTC  CPX next 3 to 4 months

## 2018-05-27 MED ORDER — CANAGLIFLOZIN 300 MG PO TABS: 300.0000 mg | ORAL_TABLET | Freq: Every day | ORAL | 5 refills | Status: DC

## 2018-05-27 MED ORDER — ROSUVASTATIN CALCIUM 40 MG PO TABS: 40.0000 mg | ORAL_TABLET | Freq: Every day | ORAL | 3 refills | Status: DC

## 2018-05-27 NOTE — Addendum Note (Signed)
Addended byDamita Dunnings D on: 05/27/2018 02:56 PM   Modules accepted: Orders

## 2018-06-10 ENCOUNTER — Ambulatory Visit: Payer: Medicaid Other | Admitting: Internal Medicine

## 2018-06-10 DIAGNOSIS — Z0289 Encounter for other administrative examinations: Secondary | ICD-10-CM

## 2018-06-11 ENCOUNTER — Encounter: Payer: Self-pay | Admitting: Internal Medicine

## 2018-08-05 ENCOUNTER — Encounter: Payer: Self-pay | Admitting: Emergency Medicine

## 2018-08-05 ENCOUNTER — Emergency Department: Payer: No Typology Code available for payment source

## 2018-08-05 ENCOUNTER — Other Ambulatory Visit: Payer: Self-pay

## 2018-08-05 ENCOUNTER — Emergency Department
Admission: EM | Admit: 2018-08-05 | Discharge: 2018-08-05 | Disposition: A | Discharge status: Home / Self Care | Payer: No Typology Code available for payment source | Attending: Student in an Organized Health Care Education/Training Program | Admitting: Student in an Organized Health Care Education/Training Program

## 2018-08-05 DIAGNOSIS — M542 Cervicalgia: Secondary | ICD-10-CM | POA: Insufficient documentation

## 2018-08-05 DIAGNOSIS — J45909 Unspecified asthma, uncomplicated: Secondary | ICD-10-CM | POA: Diagnosis not present

## 2018-08-05 DIAGNOSIS — I10 Essential (primary) hypertension: Secondary | ICD-10-CM | POA: Diagnosis not present

## 2018-08-05 DIAGNOSIS — M791 Myalgia, unspecified site: Secondary | ICD-10-CM | POA: Diagnosis not present

## 2018-08-05 DIAGNOSIS — M545 Low back pain: Secondary | ICD-10-CM | POA: Diagnosis not present

## 2018-08-05 DIAGNOSIS — M7918 Myalgia, other site: Secondary | ICD-10-CM

## 2018-08-05 DIAGNOSIS — E119 Type 2 diabetes mellitus without complications: Secondary | ICD-10-CM | POA: Diagnosis not present

## 2018-08-05 DIAGNOSIS — M25552 Pain in left hip: Secondary | ICD-10-CM | POA: Diagnosis not present

## 2018-08-05 DIAGNOSIS — Z7982 Long term (current) use of aspirin: Secondary | ICD-10-CM | POA: Insufficient documentation

## 2018-08-05 DIAGNOSIS — Z7984 Long term (current) use of oral hypoglycemic drugs: Secondary | ICD-10-CM | POA: Diagnosis not present

## 2018-08-05 DIAGNOSIS — Z79899 Other long term (current) drug therapy: Secondary | ICD-10-CM | POA: Diagnosis not present

## 2018-08-05 MED ORDER — IBUPROFEN 600 MG PO TABS
600.0000 mg | ORAL_TABLET | Freq: Once | ORAL | Status: AC
  Administered 2018-08-05: 600 mg via ORAL
  Filled 2018-08-05: qty 1

## 2018-08-05 MED ORDER — CYCLOBENZAPRINE HCL 10 MG PO TABS
10.0000 mg | ORAL_TABLET | Freq: Once | ORAL | Status: AC
  Administered 2018-08-05: 10 mg via ORAL
  Filled 2018-08-05: qty 1

## 2018-08-05 MED ORDER — TRAMADOL HCL 50 MG PO TABS: 50.0000 mg | ORAL_TABLET | Freq: Two times a day (BID) | ORAL | 0 refills | Status: DC | PRN

## 2018-08-05 MED ORDER — IBUPROFEN 600 MG PO TABS: 600.0000 mg | ORAL_TABLET | Freq: Three times a day (TID) | ORAL | 0 refills | Status: DC | PRN

## 2018-08-05 MED ORDER — TRAMADOL HCL 50 MG PO TABS
50.0000 mg | ORAL_TABLET | Freq: Once | ORAL | Status: AC
  Administered 2018-08-05: 50 mg via ORAL
  Filled 2018-08-05: qty 1

## 2018-08-05 MED ORDER — CYCLOBENZAPRINE HCL 10 MG PO TABS: 10.0000 mg | ORAL_TABLET | Freq: Three times a day (TID) | ORAL | 0 refills | Status: DC | PRN
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
## 2018-08-08 ENCOUNTER — Encounter: Payer: Self-pay | Admitting: Internal Medicine

## 2018-08-08 ENCOUNTER — Ambulatory Visit (INDEPENDENT_AMBULATORY_CARE_PROVIDER_SITE_OTHER): Payer: Self-pay | Admitting: Internal Medicine

## 2018-08-08 VITALS — BP 148/88 | HR 88 | Temp 98.0°F | Resp 16 | Ht 67.0 in | Wt 286.0 lb

## 2018-08-08 DIAGNOSIS — E785 Hyperlipidemia, unspecified: Secondary | ICD-10-CM

## 2018-08-08 DIAGNOSIS — I10 Essential (primary) hypertension: Secondary | ICD-10-CM

## 2018-08-08 DIAGNOSIS — E119 Type 2 diabetes mellitus without complications: Secondary | ICD-10-CM

## 2018-08-08 DIAGNOSIS — Z0000 Encounter for general adult medical examination without abnormal findings: Secondary | ICD-10-CM

## 2018-08-08 LAB — CBC WITH DIFFERENTIAL/PLATELET
Basophils Absolute: 0 10*3/uL (ref 0.0–0.1)
Basophils Relative: 0.3 % (ref 0.0–3.0)
Eosinophils Absolute: 0.1 10*3/uL (ref 0.0–0.7)
Eosinophils Relative: 1.5 % (ref 0.0–5.0)
HCT: 39.6 % (ref 36.0–46.0)
Hemoglobin: 12.8 g/dL (ref 12.0–15.0)
Lymphocytes Relative: 30.5 % (ref 12.0–46.0)
Lymphs Abs: 2.1 10*3/uL (ref 0.7–4.0)
MCHC: 32.3 g/dL (ref 30.0–36.0)
MCV: 85 fl (ref 78.0–100.0)
Monocytes Absolute: 0.4 10*3/uL (ref 0.1–1.0)
Monocytes Relative: 5.9 % (ref 3.0–12.0)
Neutro Abs: 4.2 10*3/uL (ref 1.4–7.7)
Neutrophils Relative %: 61.8 % (ref 43.0–77.0)
Platelets: 273 10*3/uL (ref 150.0–400.0)
RBC: 4.66 Mil/uL (ref 3.87–5.11)
RDW: 14.6 % (ref 11.5–15.5)
WBC: 6.8 10*3/uL (ref 4.0–10.5)

## 2018-08-08 LAB — COMPREHENSIVE METABOLIC PANEL
ALT: 11 U/L (ref 0–35)
AST: 11 U/L (ref 0–37)
Albumin: 3.7 g/dL (ref 3.5–5.2)
Alkaline Phosphatase: 71 U/L (ref 39–117)
BUN: 14 mg/dL (ref 6–23)
CO2: 27 mEq/L (ref 19–32)
Calcium: 9.4 mg/dL (ref 8.4–10.5)
Chloride: 107 mEq/L (ref 96–112)
Creatinine, Ser: 0.87 mg/dL (ref 0.40–1.20)
GFR: 82.75 mL/min (ref >60.00)
Glucose, Bld: 150 mg/dL — ABNORMAL HIGH (ref 70–99)
Potassium: 4 mEq/L (ref 3.5–5.1)
Sodium: 141 mEq/L (ref 135–145)
Total Bilirubin: 0.4 mg/dL (ref 0.2–1.2)
Total Protein: 6.9 g/dL (ref 6.0–8.3)

## 2018-08-08 LAB — LIPID PANEL
Cholesterol: 215 mg/dL — ABNORMAL HIGH (ref 0–200)
HDL: 51 mg/dL (ref >39.00)
LDL Cholesterol: 140 mg/dL — ABNORMAL HIGH (ref 0–99)
NonHDL: 163.69
Total CHOL/HDL Ratio: 4
Triglycerides: 118 mg/dL (ref 0.0–149.0)
VLDL: 23.6 mg/dL (ref 0.0–40.0)

## 2018-08-08 LAB — HEMOGLOBIN A1C: Hgb A1c MFr Bld: 8 % — ABNORMAL HIGH (ref 4.6–6.5)

## 2018-08-08 LAB — VITAMIN B12: Vitamin B-12: 342 pg/mL (ref 211–911)

## 2018-08-08 NOTE — Progress Notes (Signed)
Pre visit review using our clinic review tool, if applicable. No additional management support is needed unless otherwise documented below in the visit note. 
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
## 2018-08-08 NOTE — Assessment & Plan Note (Signed)
-  Tdap 2014; pnm shot-2016; prevnar 2017 . Had a Flu shot   . -CCS: Never had a cscope, 3 modalities discussed again today, elected IFOB  --Female care per gyn, just called Dr Benjie Karvonen to get a f/u Last  MMG  Per  [facility] 2017 h/o abn PAPs, s/p hysterectomy, no oophorectomy - (+) FH: DM,  breast cancer and CAD. - Labs: CMP, FLP, CBC, A1c, B12 -Diet-exercise discussed
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
## 2018-08-27 ENCOUNTER — Ambulatory Visit: Payer: BLUE CROSS/BLUE SHIELD | Admitting: Neurology

## 2018-09-17 ENCOUNTER — Telehealth: Payer: Self-pay | Admitting: Internal Medicine

## 2018-09-17 MED ORDER — METFORMIN HCL 1000 MG PO TABS: ORAL_TABLET | ORAL | 5 refills | Status: DC

## 2018-09-17 NOTE — Telephone Encounter (Signed)
Pt requesting refill on metformin. Rx sent.

## 2018-09-18 ENCOUNTER — Ambulatory Visit: Payer: Medicaid Other | Admitting: Internal Medicine

## 2018-09-24 ENCOUNTER — Ambulatory Visit: Payer: Self-pay | Admitting: Internal Medicine

## 2018-09-24 ENCOUNTER — Other Ambulatory Visit: Payer: Self-pay

## 2018-10-23 ENCOUNTER — Telehealth: Payer: Self-pay

## 2018-10-23 NOTE — Telephone Encounter (Signed)
LMOM informing she is due for 6 week f/u. Instructed to call office to schedule virtual visit.

## 2018-12-30 ENCOUNTER — Emergency Department
Admission: EM | Admit: 2018-12-30 | Discharge: 2018-12-30 | Disposition: A | Discharge status: Home / Self Care | Payer: Medicaid Other | Attending: Emergency Medicine | Admitting: Emergency Medicine

## 2018-12-30 ENCOUNTER — Other Ambulatory Visit: Payer: Self-pay

## 2018-12-30 ENCOUNTER — Encounter: Payer: Self-pay | Admitting: Emergency Medicine

## 2018-12-30 ENCOUNTER — Emergency Department: Payer: Medicaid Other

## 2018-12-30 DIAGNOSIS — J45909 Unspecified asthma, uncomplicated: Secondary | ICD-10-CM | POA: Insufficient documentation

## 2018-12-30 DIAGNOSIS — Y998 Other external cause status: Secondary | ICD-10-CM | POA: Insufficient documentation

## 2018-12-30 DIAGNOSIS — Z79899 Other long term (current) drug therapy: Secondary | ICD-10-CM | POA: Insufficient documentation

## 2018-12-30 DIAGNOSIS — Y929 Unspecified place or not applicable: Secondary | ICD-10-CM | POA: Insufficient documentation

## 2018-12-30 DIAGNOSIS — S99912A Unspecified injury of left ankle, initial encounter: Secondary | ICD-10-CM

## 2018-12-30 DIAGNOSIS — Z7984 Long term (current) use of oral hypoglycemic drugs: Secondary | ICD-10-CM | POA: Insufficient documentation

## 2018-12-30 DIAGNOSIS — E119 Type 2 diabetes mellitus without complications: Secondary | ICD-10-CM | POA: Insufficient documentation

## 2018-12-30 DIAGNOSIS — X509XXA Other and unspecified overexertion or strenuous movements or postures, initial encounter: Secondary | ICD-10-CM | POA: Insufficient documentation

## 2018-12-30 DIAGNOSIS — Y9301 Activity, walking, marching and hiking: Secondary | ICD-10-CM | POA: Insufficient documentation

## 2018-12-30 DIAGNOSIS — I10 Essential (primary) hypertension: Secondary | ICD-10-CM | POA: Insufficient documentation

## 2018-12-30 MED ORDER — TRAMADOL HCL 50 MG PO TABS: 50.0000 mg | ORAL_TABLET | Freq: Four times a day (QID) | ORAL | 0 refills | Status: AC | PRN

## 2018-12-30 MED ORDER — KETOROLAC TROMETHAMINE 30 MG/ML IJ SOLN
30.0000 mg | Freq: Once | INTRAMUSCULAR | Status: AC
  Administered 2018-12-30: 30 mg via INTRAMUSCULAR
  Filled 2018-12-30: qty 1

## 2018-12-30 MED ORDER — IBUPROFEN 600 MG PO TABS: 600.0000 mg | ORAL_TABLET | Freq: Four times a day (QID) | ORAL | 0 refills | Status: DC | PRN

## 2018-12-30 NOTE — ED Provider Notes (Signed)
[facility] Emergency Department Provider Note  ____________________________________________  Time seen: Approximately 9:36 PM  I have reviewed the triage vital signs and the nursing notes.   HISTORY  Chief Complaint Ankle Pain    HPI [name] is a 53 y.o. female that presents to the emergency department for evaluation of posterior ankle pain after injury tonight.  Patient was walking down her steps when she missed stepped and her foot twisted.  She is having pain to the back of her ankle.  She feels a pulling sensation up into her low calf.  She did not fall.  No additional injuries.  She drove herself to the emergency department.  Past Medical History:  Diagnosis Date  . Anxiety state, unspecified 05/16/2013  . Arthritis    "right knee" (08/27/2013)  . Asthma    "only when I get a sinus infection which is 1-2 X/yr" (08/27/2013)  . Chronic pain syndrome   . Diabetes ([place])    "borderline" (08/27/2013)  . Failed back syndrome, cervical   . GERD (gastroesophageal reflux disease)   . Hypertension    no meds  . Lumbar radiculopathy, chronic   . Migraine    "once or twice/month now" (08/27/2013)  . Neck pain    axial, s/p cervical fusion C5-6, C6-7   . Obesity   . Sickle cell trait ([place])   . Wears glasses     Patient Active Problem List   Diagnosis Date Noted  . Unilateral primary osteoarthritis, right knee 03/02/2017  . Impingement syndrome of left shoulder 06/13/2016  . Sacroiliac dysfunction 03/28/2016  . Dyslipidemia 10/18/2015  . PCP NOTES >>>>>>>>>>> 04/01/2015  . S/P laparoscopic hysterectomy 01/22/2015  . Migraine with aura and without status migrainosus, not intractable 01/20/2015  . Chronic back pain 01/20/2015  . Chronic low back pain 01/18/2015  . Postlaminectomy syndrome, cervical region 01/18/2015  . Lumbar facet arthropathy 01/18/2015  . Lumbar degenerative disc disease 01/18/2015  . Anxiety state 05/16/2013  . Morbid obesity  ([place]) 04/15/2013  . Diabetes ([place]) 02/14/2013  . Mild anemia 02/14/2013  . HTN (hypertension) 12/12/2012  . Annual physical exam 12/12/2012  . 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 GERD (gastroesophageal reflux disease)     Past Surgical History:  Procedure Laterality Date  . ANTERIOR CERVICAL DECOMP/DISCECTOMY FUSION N/A 08/27/2013   Procedure: ANTERIOR CERVICAL /DISCECTOMY FUSION (ACDF) C5-C7  2 LEVELS;  Surgeon: Melina Schools, MD;  Location: [facility];  Service: Orthopedics;  Laterality: N/A;  . BREAST REDUCTION SURGERY Bilateral 09/28/2014   Procedure: MAMMARY REDUCTION  (BREAST) BILATERAL;  Surgeon: Cristine Polio, MD;  Location: [facility];  Service: Plastics;  Laterality: Bilateral;  . CHOLECYSTECTOMY  06/1989  . DILATION AND CURETTAGE OF UTERUS    . HERNIA REPAIR    . KNEE ARTHROSCOPY Left 08/2003  . LAPAROSCOPIC BILATERAL SALPINGECTOMY Bilateral 01/22/2015   Procedure: LAPAROSCOPIC BILATERAL SALPINGECTOMY;  Surgeon: Azucena Fallen, MD;  Location: [facility];  Service: Gynecology;  Laterality: Bilateral;  . LEFT HEART CATHETERIZATION WITH CORONARY ANGIOGRAM N/A 04/15/2013   Procedure: LEFT HEART CATHETERIZATION WITH CORONARY ANGIOGRAM;  Surgeon: Peter M Martinique, MD;  Location: [facility] CATH LAB;  Service: Cardiovascular;  Laterality: N/A;  . LIPOSUCTION Bilateral 09/28/2014   Procedure: LIPOSUCTION;  Surgeon: Cristine Polio, MD;  Location: [facility];  Service: Plastics;  Laterality: Bilateral;  . ROBOTIC ASSISTED TOTAL HYSTERECTOMY N/A 01/22/2015   has her ovaries---ROBOTIC ASSISTED TOTAL HYSTERECTOMY With Pelvic Washings, Lysis of Adhesions;  Surgeon: Azucena Fallen, MD;  Location: [facility];  Service: Gynecology;  Laterality: N/A;  . SHOULDER ARTHROSCOPY W/ ROTATOR CUFF REPAIR Right 2006, 2007  . TUBAL LIGATION  1991  . UMBILICAL HERNIA REPAIR  2001, 2003  . [place]    Prior to Admission medications   Medication Sig Start Date End Date Taking?  Authorizing Provider  albuterol (VENTOLIN HFA) 108 (90 Base) MCG/ACT inhaler Inhale 2 puffs into the lungs every 6 (six) hours as needed for wheezing or shortness of breath. 11/06/16   Colon Branch, MD  Ascorbic Acid (VITAMIN C) 100 MG tablet Take 100 mg by mouth 2 (two) times daily.     [provider]  aspirin EC 81 MG tablet Take 81 mg by mouth daily.    [provider]  baclofen (LIORESAL) 10 MG tablet Take 1 tablet as needed at onset of migraine. 01/04/18   Cameron Sprang, MD  budesonide-formoterol [facility]) 160-4.5 MCG/ACT inhaler Inhale 2 puffs into the lungs 2 (two) times daily. Patient taking differently: Inhale 2 puffs into the lungs 2 (two) times daily as needed (shortness of breath).  04/16/15   Colon Branch, MD  canagliflozin [facility]) 300 MG TABS tablet Take 1 tablet (300 mg total) by mouth daily before breakfast. 05/27/18   Colon Branch, MD  cyclobenzaprine (FLEXERIL) 10 MG tablet Take 1 tablet (10 mg total) by mouth 3 (three) times daily as needed. 08/05/18   Sable Feil, PA-C  fexofenadine (ALLEGRA) 180 MG tablet Take 180 mg by mouth daily.    [provider]  folic acid (FOLVITE) 1 MG tablet Take 1 mg by mouth daily.    [provider]  gabapentin (NEURONTIN) 300 MG capsule Take 3 caps at bedtime 01/04/18   Cameron Sprang, MD  ibuprofen (ADVIL) 600 MG tablet Take 1 tablet (600 mg total) by mouth every 6 (six) hours as needed. 12/30/18   Laban Emperor, PA-C  losartan (COZAAR) 100 MG tablet Take 1 tablet (100 mg total) by mouth daily. 05/24/18   Colon Branch, MD  metFORMIN (GLUCOPHAGE) 1000 MG tablet Take 1.5 tablets by mouth every morning and 1 tablet by mouth every evening 09/17/18   Colon Branch, MD  naproxen (NAPROSYN) 375 MG tablet Take 1 tablet (375 mg total) by mouth 2 (two) times daily. 04/08/17   Janne Napoleon, NP  rosuvastatin (CRESTOR) 40 MG tablet Take 1 tablet (40 mg total) by mouth at bedtime. 05/27/18   Colon Branch, MD  sitaGLIPtin  (JANUVIA) 100 MG tablet Take 1 tablet (100 mg total) by mouth daily. 02/20/17   Colon Branch, MD  topiramate (TOPAMAX) 50 MG tablet Take 1 tablet in AM, 2 tablets in PM 01/04/18   Cameron Sprang, MD  traMADol (ULTRAM) 50 MG tablet Take 1 tablet (50 mg total) by mouth every 6 (six) hours as needed. 12/30/18 12/30/19  Laban Emperor, PA-C  vitamin B-12 (CYANOCOBALAMIN) 500 MCG tablet Take 1,000 mcg by mouth daily.     [provider]  zolpidem (AMBIEN) 10 MG tablet Take 0.5-1 tablets (5-10 mg total) by mouth at bedtime as needed for sleep. 08/01/17 08/08/18  Colon Branch, MD    Allergies Codeine and Vicodin [hydrocodone-acetaminophen]  Family History  Problem Relation Age of Onset  . Breast cancer Other         GM  . Diabetes Father        F, B, S  . CAD Father  died of MI @ 31  . Kidney disease Father   . Pancreatic cancer Other        GM  . CAD Sister        MI at age 23  . CAD Brother        CHF, died at age 26  . Heart attack Brother        died suddenly @ 9  . Leukemia Sister   . Colon cancer Neg Hx     Social History Social History   Tobacco Use  . Smoking status: Never Smoker  . Smokeless tobacco: Never Used  Substance Use Topics  . Alcohol use: No    Alcohol/week: 0.0 standard drinks  . Drug use: No     Review of Systems  Gastrointestinal:  No nausea, no vomiting.  Musculoskeletal: Positive for ankle pain. Skin: Negative for rash, abrasions, lacerations, ecchymosis. Neurological: Negative for numbness or tingling   ____________________________________________   PHYSICAL EXAM:  VITAL SIGNS: ED Triage Vitals  Enc Vitals Group     BP 12/30/18 2020 (!) 163/101     Pulse Rate 12/30/18 2020 99     Resp 12/30/18 2020 20     Temp 12/30/18 2020 99.3 F (37.4 C)     Temp Source 12/30/18 2020 Oral     SpO2 12/30/18 2020 96 %     Weight --      Height --      Head Circumference --      Peak Flow --      Pain Score 12/30/18 2016 7     Pain Loc  --      Pain Edu? --      Excl. in [place]? --      Constitutional: Alert and oriented. Well appearing and in no acute distress. Eyes: Conjunctivae are normal. PERRL. EOMI. Head: Atraumatic. ENT:      Ears:      Nose: No congestion/rhinnorhea.      Mouth/Throat: Mucous membranes are moist.  Neck: No stridor.  Cardiovascular: Normal rate, regular rhythm.  Good peripheral circulation. Respiratory: Normal respiratory effort without tachypnea or retractions. Lungs CTAB. Good air entry to the bases with no decreased or absent breath sounds. Musculoskeletal: No gross deformities appreciated.  Tenderness to palpation to posterior ankle.  Limited range of motion of ankle due to pain.  Negative Thompson test.  Palpable dorsalis pedis pulses. Neurologic:  Normal speech and language. No gross focal neurologic deficits are appreciated.  Skin:  Skin is warm, dry and intact. No rash noted. Psychiatric: Mood and affect are normal. Speech and behavior are normal. Patient exhibits appropriate insight and judgement.   ____________________________________________   LABS (all labs ordered are listed, but only abnormal results are displayed)  Labs Reviewed - No data to display ____________________________________________  EKG   ____________________________________________  RADIOLOGY Robinette Haines, personally viewed and evaluated these images (plain radiographs) as part of my medical decision making, as well as reviewing the written report by the radiologist.  Dg Ankle Complete Right  Result Date: 12/30/2018 CLINICAL DATA:  Acute right ankle pain after twisting injury. EXAM: RIGHT ANKLE - COMPLETE 3+ VIEW COMPARISON:  Right ankle x-rays dated May 16, 2012. FINDINGS: No acute fracture or dislocation. Unchanged well corticated ossific density adjacent to the lateral malleolus, likely due to old trauma. The ankle mortise is symmetric. The talar dome is intact. No tibiotalar joint effusion. Joint  spaces are preserved. Mild dorsal midfoot degenerative spurring. Large Achilles enthesophyte. Bone  mineralization is normal. Mild diffuse soft tissue swelling. IMPRESSION: No acute osseous abnormality. Electronically Signed   By: Titus Dubin M.D.   On: 12/30/2018 20:53    ____________________________________________    PROCEDURES  Procedure(s) performed:    Procedures    Medications  ketorolac (TORADOL) 30 MG/ML injection 30 mg (30 mg Intramuscular Given 12/30/18 2152)     ____________________________________________   INITIAL IMPRESSION / ASSESSMENT AND PLAN / ED COURSE  Pertinent labs & imaging results that were available during my care of the patient were reviewed by me and considered in my medical decision making (see chart for details).  Review of the [place] CSRS was performed in accordance of the [place] prior to dispensing any controlled drugs.     Patient presents emergency department for evaluation of ankle pain after injury tonight.  Vital signs and exam are reassuring.  X-ray negative for acute bony abnormalities.  Patient is having pain to her posterior ankle.  I do not suspect Achilles rupture but patient will follow-up with Ortho for possible achilles injury.   OCL splint was placed.  Crutches were given.Patient will be discharged home with prescriptions for Motrin and a short course of tramadol. Patient is to follow up with ortho as directed.  Referral was given to Dr. Posey Pronto.  Patient is given ED precautions to return to the ED for any worsening or new symptoms.  MARICSA SAMMONS was evaluated in Emergency Department on 12/30/2018 for the symptoms described in the history of present illness. She was evaluated in the context of the global COVID-19 pandemic, which necessitated consideration that the patient might be at risk for infection with the SARS-CoV-2 virus that causes COVID-19. Institutional protocols and algorithms that pertain to the evaluation of patients at risk  for COVID-19 are in a state of rapid change based on information released by regulatory bodies including the CDC and federal and state organizations. These policies and algorithms were followed during the patient's care in the ED.     ____________________________________________  FINAL CLINICAL IMPRESSION(S) / ED DIAGNOSES  Final diagnoses:  Injury of left ankle, initial encounter      NEW MEDICATIONS STARTED DURING THIS VISIT:  ED Discharge Orders         Ordered    ibuprofen (ADVIL) 600 MG tablet  Every 6 hours PRN     12/30/18 2220    traMADol (ULTRAM) 50 MG tablet  Every 6 hours PRN     12/30/18 2220              This chart was dictated using voice recognition software/Dragon. Despite best efforts to proofread, errors can occur which can change the meaning. Any change was purely unintentional.    Laban Emperor, PA-C 12/30/18 2341    Carrie Mew, MD 01/01/19 [phone]

## 2018-12-30 NOTE — ED Triage Notes (Signed)
Patient to ER for c/o right ankle pain after stepping down on a step wrong and turning ankle.

## 2018-12-30 NOTE — Discharge Instructions (Signed)
Your x-ray does not show any broken bones.  I suspect that you have injured your Achilles tendon.  Please wear splint and use crutches until seen by orthopedics.  Ice and elevate foot tonight.  You can take ibuprofen for pain and inflammation and Percocet for extreme pain.

## 2019-02-22 ENCOUNTER — Other Ambulatory Visit: Payer: Self-pay | Admitting: Radiology

## 2019-02-22 DIAGNOSIS — Z20822 Contact with and (suspected) exposure to covid-19: Secondary | ICD-10-CM

## 2019-02-23 ENCOUNTER — Telehealth: Payer: Self-pay

## 2019-02-23 LAB — NOVEL CORONAVIRUS, NAA: SARS-CoV-2, NAA: NOT DETECTED

## 2019-02-23 NOTE — Telephone Encounter (Signed)
Received call from patient to check Covid results.  Advised patient negative.

## 2019-04-02 ENCOUNTER — Other Ambulatory Visit: Payer: Self-pay

## 2019-04-02 ENCOUNTER — Emergency Department (HOSPITAL_COMMUNITY)
Admission: EM | Admit: 2019-04-02 | Discharge: 2019-04-02 | Discharge status: Absent without leave | Payer: Medicaid Other

## 2019-04-02 ENCOUNTER — Ambulatory Visit (HOSPITAL_COMMUNITY)
Admission: EM | Admit: 2019-04-02 | Discharge: 2019-04-02 | Disposition: A | Discharge status: Home / Self Care | Payer: Self-pay | Attending: Family Medicine | Admitting: Family Medicine

## 2019-04-02 ENCOUNTER — Telehealth (HOSPITAL_COMMUNITY): Payer: Self-pay | Admitting: Emergency Medicine

## 2019-04-02 ENCOUNTER — Encounter (HOSPITAL_COMMUNITY): Payer: Self-pay | Admitting: Emergency Medicine

## 2019-04-02 DIAGNOSIS — L03115 Cellulitis of right lower limb: Secondary | ICD-10-CM

## 2019-04-02 MED ORDER — CEPHALEXIN 500 MG PO CAPS: 500.0000 mg | ORAL_CAPSULE | Freq: Four times a day (QID) | ORAL | 0 refills | Status: DC

## 2019-04-02 NOTE — Telephone Encounter (Signed)
Patient called and states unable to get in for doppler, as they are closed already (after 1900).  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  This RN explained that Dr. Sabra Heck had ordered her antibiotics to treat her leg, and she should be using cold compresses to the area until she can be seen.  This RN unsure of what other option to offer patient, but told her to call at 8am tomorrow morning to speak to [facility] staff, since there is no one to call for a doppler or to schedule radiology tonight.  Patient very upset, wanted a phone number to complain.  This RN encouraged her again to call [facility] staff in the morning, and that leadership may be here at that time for her to speak to them.  Patient then states her medication was sent to the wrong pharmacy.  This RN corrected and resent to [place]

## 2019-04-02 NOTE — Discharge Instructions (Addendum)
Probable cellulitis of right lower leg but cannot rule out deep venous thrombosis.  Needs to be evaluated in the ER with Doppler

## 2019-04-02 NOTE — ED Triage Notes (Signed)
Patient reports something crawling on right lower leg on Monday.    Today there is redness to back of right lower leg, swelling in right ankle and foot.    Patient reports recently suffering an achilles injury and and was relieved of wearing boot last Friday 03/28/2019.  Patient has 1 = palpable pulse in right foot.

## 2019-04-02 NOTE — ED Provider Notes (Signed)
[place]    CSN: [id number] Arrival date & time: 04/02/19  1727      History   Chief Complaint Chief Complaint  Patient presents with  . Leg Pain    HPI [name] is a 53 y.o. female.   Patient presents with swollen red right lower leg.  Patient had just come out of a boot that she has worn since June for ruptured Achilles tendon.  States that she felt something crawling on her leg prior to onset of the symptoms and thinks she may have been bitten by some insect, perhaps a spider..  She has no particular risk factors for DVT except obesity.  HPI  Past Medical History:  Diagnosis Date  . Anxiety state, unspecified 05/16/2013  . Arthritis    "right knee" (08/27/2013)  . Asthma    "only when I get a sinus infection which is 1-2 X/yr" (08/27/2013)  . Chronic pain syndrome   . Diabetes ([place])    "borderline" (08/27/2013)  . Failed back syndrome, cervical   . GERD (gastroesophageal reflux disease)   . Hypertension    no meds  . Lumbar radiculopathy, chronic   . Migraine    "once or twice/month now" (08/27/2013)  . Neck pain    axial, s/p cervical fusion C5-6, C6-7   . Obesity   . Sickle cell trait ([place])   . Wears glasses     Patient Active Problem List   Diagnosis Date Noted  . Unilateral primary osteoarthritis, right knee 03/02/2017  . Impingement syndrome of left shoulder 06/13/2016  . Sacroiliac dysfunction 03/28/2016  . Dyslipidemia 10/18/2015  . PCP NOTES >>>>>>>>>>> 04/01/2015  . S/P laparoscopic hysterectomy 01/22/2015  . Migraine with aura and without status migrainosus, not intractable 01/20/2015  . Chronic back pain 01/20/2015  . Chronic low back pain 01/18/2015  . Postlaminectomy syndrome, cervical region 01/18/2015  . Lumbar facet arthropathy 01/18/2015  . Lumbar degenerative disc disease 01/18/2015  . Anxiety state 05/16/2013  . Morbid obesity ([place]) 04/15/2013  . Diabetes ([place]) 02/14/2013  . Mild anemia 02/14/2013  . HTN  (hypertension) 12/12/2012  . Annual physical exam 12/12/2012  . 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 GERD (gastroesophageal reflux disease)     Past Surgical History:  Procedure Laterality Date  . ANTERIOR CERVICAL DECOMP/DISCECTOMY FUSION N/A 08/27/2013   Procedure: ANTERIOR CERVICAL /DISCECTOMY FUSION (ACDF) C5-C7  2 LEVELS;  Surgeon: Melina Schools, MD;  Location: [place];  Service: Orthopedics;  Laterality: N/A;  . BREAST REDUCTION SURGERY Bilateral 09/28/2014   Procedure: MAMMARY REDUCTION  (BREAST) BILATERAL;  Surgeon: Cristine Polio, MD;  Location: [place];  Service: Plastics;  Laterality: Bilateral;  . CHOLECYSTECTOMY  06/1989  . DILATION AND CURETTAGE OF UTERUS    . HERNIA REPAIR    . KNEE ARTHROSCOPY Left 08/2003  . LAPAROSCOPIC BILATERAL SALPINGECTOMY Bilateral 01/22/2015   Procedure: LAPAROSCOPIC BILATERAL SALPINGECTOMY;  Surgeon: Azucena Fallen, MD;  Location: [facility];  Service: Gynecology;  Laterality: Bilateral;  . LEFT HEART CATHETERIZATION WITH CORONARY ANGIOGRAM N/A 04/15/2013   Procedure: LEFT HEART CATHETERIZATION WITH CORONARY ANGIOGRAM;  Surgeon: Peter M Martinique, MD;  Location: [facility] C[facility];  Service: Cardiovascular;  Laterality: N/A;  . LIPOSUCTION Bilateral 09/28/2014   Procedure: LIPOSUCTION;  Surgeon: Cristine Polio, MD;  Location: [place];  Service: Plastics;  Laterality: Bilateral;  . ROBOTIC ASSISTED TOTAL HYSTERECTOMY N/A 01/22/2015   has her ovaries---ROBOTIC ASSISTED TOTAL HYSTERECTOMY With Pelvic Washings, Lysis of Adhesions;  Surgeon: Azucena Fallen,  MD;  Location: [facility];  Service: Gynecology;  Laterality: N/A;  . SHOULDER ARTHROSCOPY W/ ROTATOR CUFF REPAIR Right 2006, 2007  . TUBAL LIGATION  1991  . UMBILICAL HERNIA REPAIR  2001, 2003  . [place]    OB History   No obstetric history on file.      Home Medications    Prior to Admission medications   Medication Sig Start Date End Date Taking? Authorizing  Provider  Ascorbic Acid (VITAMIN C) 100 MG tablet Take 100 mg by mouth 2 (two) times daily.    Yes [provider]  canagliflozin (INVOKANA) 300 MG TABS tablet Take 1 tablet (300 mg total) by mouth daily before breakfast. 05/27/18  Yes Paz, Alda Berthold, MD  fexofenadine (ALLEGRA) 180 MG tablet Take 180 mg by mouth daily.   Yes [provider]  folic acid (FOLVITE) 1 MG tablet Take 1 mg by mouth daily.   Yes [provider]  losartan (COZAAR) 100 MG tablet Take 1 tablet (100 mg total) by mouth daily. 05/24/18  Yes Colon Branch, MD  metFORMIN (GLUCOPHAGE) 1000 MG tablet Take 1.5 tablets by mouth every morning and 1 tablet by mouth every evening 09/17/18  Yes Paz, Alda Berthold, MD  rosuvastatin (CRESTOR) 40 MG tablet Take 1 tablet (40 mg total) by mouth at bedtime. 05/27/18  Yes Paz, Alda Berthold, MD  sitaGLIPtin (JANUVIA) 100 MG tablet Take 1 tablet (100 mg total) by mouth daily. 02/20/17  Yes Paz, Alda Berthold, MD  topiramate (TOPAMAX) 50 MG tablet Take 1 tablet in AM, 2 tablets in PM 01/04/18  Yes Cameron Sprang, MD  albuterol (VENTOLIN HFA) 108 (90 Base) MCG/ACT inhaler Inhale 2 puffs into the lungs every 6 (six) hours as needed for wheezing or shortness of breath. 11/06/16   Colon Branch, MD  aspirin EC 81 MG tablet Take 81 mg by mouth daily.    [provider]  baclofen (LIORESAL) 10 MG tablet Take 1 tablet as needed at onset of migraine. 01/04/18   Cameron Sprang, MD  budesonide-formoterol [facility]) 160-4.5 MCG/ACT inhaler Inhale 2 puffs into the lungs 2 (two) times daily. Patient taking differently: Inhale 2 puffs into the lungs 2 (two) times daily as needed (shortness of breath).  04/16/15   Colon Branch, MD  cyclobenzaprine (FLEXERIL) 10 MG tablet Take 1 tablet (10 mg total) by mouth 3 (three) times daily as needed. 08/05/18   Sable Feil, PA-C  gabapentin (NEURONTIN) 300 MG capsule Take 3 caps at bedtime 01/04/18   Cameron Sprang, MD  ibuprofen (ADVIL) 600 MG tablet Take 1 tablet  (600 mg total) by mouth every 6 (six) hours as needed. 12/30/18   Laban Emperor, PA-C  naproxen (NAPROSYN) 375 MG tablet Take 1 tablet (375 mg total) by mouth 2 (two) times daily. 04/08/17   Janne Napoleon, NP  traMADol (ULTRAM) 50 MG tablet Take 1 tablet (50 mg total) by mouth every 6 (six) hours as needed. 12/30/18 12/30/19  Laban Emperor, PA-C  vitamin B-12 (CYANOCOBALAMIN) 500 MCG tablet Take 1,000 mcg by mouth daily.     [provider]  zolpidem (AMBIEN) 10 MG tablet Take 0.5-1 tablets (5-10 mg total) by mouth at bedtime as needed for sleep. 08/01/17 08/08/18  Colon Branch, MD    Family History Family History  Problem Relation Age of Onset  . Breast cancer Other         GM  . Diabetes Father  F, B, S  . CAD Father        died of MI @ 72  . Kidney disease Father   . Pancreatic cancer Other        GM  . CAD Sister        MI at age 56  . CAD Brother        CHF, died at age 69  . Heart attack Brother        died suddenly @ 6  . Leukemia Sister   . Colon cancer Neg Hx     Social History Social History   Tobacco Use  . Smoking status: Never Smoker  . Smokeless tobacco: Never Used  Substance Use Topics  . Alcohol use: No    Alcohol/week: 0.0 standard drinks  . Drug use: No     Allergies   Codeine and Vicodin [hydrocodone-acetaminophen]   Review of Systems Review of Systems  Constitutional: Negative.   HENT: Negative.   Skin: Positive for color change.       Erythema and swelling right lower leg  Neurological: Negative.   Hematological: Negative.   Psychiatric/Behavioral: Negative.   All other systems reviewed and are negative.    Physical Exam Triage Vital Signs ED Triage Vitals  Enc Vitals Group     BP 04/02/19 1822 (!) 165/98     Pulse Rate 04/02/19 1822 87     Resp 04/02/19 1822 18     Temp 04/02/19 1822 98.6 F (37 C)     Temp Source 04/02/19 1822 Oral     SpO2 04/02/19 1822 97 %     Weight --      Height --      Head Circumference --       Peak Flow --      Pain Score 04/02/19 1818 5     Pain Loc --      Pain Edu? --      Excl. in [place]? --    No data found.  Updated Vital Signs BP (!) 165/98 (BP Location: Right Arm) Comment: has not taken blood pressure medicine Comment (BP Location): large cuff  Pulse 87   Temp 98.6 F (37 C) (Oral)   Resp 18   LMP 01/16/2015   SpO2 97%   Visual Acuity Right Eye Distance:   Left Eye Distance:   Bilateral Distance:    Right Eye Near:   Left Eye Near:    Bilateral Near:     Physical Exam Vitals signs and nursing note reviewed.  Constitutional:      Appearance: Normal appearance. She is obese.  Cardiovascular:     Rate and Rhythm: Normal rate and regular rhythm.  Pulmonary:     Effort: Pulmonary effort is normal.     Breath sounds: Normal breath sounds.  Musculoskeletal:     Comments: Right leg.  4+ swollen and tight.  There is generalized erythema posteriorly but also to other areas consistent with insect bite superiorly. Pulses are 2+.    Neurological:     General: No focal deficit present.     Mental Status: She is alert.      UC Treatments / Results  Labs (all labs ordered are listed, but only abnormal results are displayed) Labs Reviewed - No data to display  EKG   Radiology No results found.  Procedures Procedures (including critical care time)  Medications Ordered in UC Medications - No data to display  Initial Impression / Assessment and Plan /  UC Course  I have reviewed the triage vital signs and the nursing notes.  Pertinent labs & imaging results that were available during my care of the patient were reviewed by me and considered in my medical decision making (see chart for details).     Swollen right lower leg.  This likely represents reaction to a spider bite but I cannot rule out DVT.  I believe she needs Doppler ultrasound to rule out DVT before she can be treated for localized reaction to bite. Final Clinical Impressions(s) / UC  Diagnoses   Final diagnoses:  None   Discharge Instructions   None    ED Prescriptions    None     PDMP not reviewed this encounter.   Wardell Honour, MD 04/02/19 [phone]

## 2019-04-03 ENCOUNTER — Emergency Department (HOSPITAL_BASED_OUTPATIENT_CLINIC_OR_DEPARTMENT_OTHER): Payer: Self-pay

## 2019-04-03 ENCOUNTER — Encounter (HOSPITAL_COMMUNITY): Payer: Medicaid Other

## 2019-04-03 ENCOUNTER — Other Ambulatory Visit: Payer: Self-pay

## 2019-04-03 ENCOUNTER — Emergency Department (HOSPITAL_COMMUNITY)
Admission: EM | Admit: 2019-04-03 | Discharge: 2019-04-03 | Disposition: A | Discharge status: Home / Self Care | Payer: Self-pay | Attending: Emergency Medicine | Admitting: Emergency Medicine

## 2019-04-03 ENCOUNTER — Encounter (HOSPITAL_COMMUNITY): Payer: Self-pay

## 2019-04-03 DIAGNOSIS — I10 Essential (primary) hypertension: Secondary | ICD-10-CM | POA: Insufficient documentation

## 2019-04-03 DIAGNOSIS — E119 Type 2 diabetes mellitus without complications: Secondary | ICD-10-CM | POA: Insufficient documentation

## 2019-04-03 DIAGNOSIS — L03115 Cellulitis of right lower limb: Secondary | ICD-10-CM | POA: Insufficient documentation

## 2019-04-03 DIAGNOSIS — M7989 Other specified soft tissue disorders: Secondary | ICD-10-CM

## 2019-04-03 DIAGNOSIS — Z79899 Other long term (current) drug therapy: Secondary | ICD-10-CM | POA: Insufficient documentation

## 2019-04-03 DIAGNOSIS — Z7982 Long term (current) use of aspirin: Secondary | ICD-10-CM | POA: Insufficient documentation

## 2019-04-03 DIAGNOSIS — Z7984 Long term (current) use of oral hypoglycemic drugs: Secondary | ICD-10-CM | POA: Insufficient documentation

## 2019-04-03 NOTE — Progress Notes (Signed)
Lower extremity venous has been completed.   Preliminary results in CV Proc.   Abram Sander 04/03/2019 10:15 AM

## 2019-04-03 NOTE — ED Provider Notes (Signed)
[place] DEPT Provider Note   CSN: [id number] Arrival date & time: 04/03/19  0756     History   Chief Complaint Chief Complaint  Patient presents with  . Leg Swelling    HPI BAELYNN JEPPESEN is a 53 y.o. female.     53yo female with history of diabetes, hypertension, obesity presents with complaint of right lower leg pain, redness, swelling x 3-4 days. Patient was immobilized in a boot since June for an achilles injury, recently came out of the boot and felt something crawling on her leg, unsure if she was bitten by an insect. Patient went to urgent care yesterday, was given Keflex and advised to go to the ER for a doppler study. Patient is a non smoker, no history of prior DVT/PE, not on OCPs. Patient reports some improvement since starting the Keflex, reports increase in swelling today possibly due to being on her feet more and extended wait in the ER. Denies SHOb or CP, no other complaints or concerns.      Past Medical History:  Diagnosis Date  . Anxiety state, unspecified 05/16/2013  . Arthritis    "right knee" (08/27/2013)  . Asthma    "only when I get a sinus infection which is 1-2 X/yr" (08/27/2013)  . Chronic pain syndrome   . Diabetes ([place])    "borderline" (08/27/2013)  . Failed back syndrome, cervical   . GERD (gastroesophageal reflux disease)   . Hypertension    no meds  . Lumbar radiculopathy, chronic   . Migraine    "once or twice/month now" (08/27/2013)  . Neck pain    axial, s/p cervical fusion C5-6, C6-7   . Obesity   . Sickle cell trait ([place])   . Wears glasses     Patient Active Problem List   Diagnosis Date Noted  . Unilateral primary osteoarthritis, right knee 03/02/2017  . Impingement syndrome of left shoulder 06/13/2016  . Sacroiliac dysfunction 03/28/2016  . Dyslipidemia 10/18/2015  . PCP NOTES >>>>>>>>>>> 04/01/2015  . S/P laparoscopic hysterectomy 01/22/2015  . Migraine with aura and without status  migrainosus, not intractable 01/20/2015  . Chronic back pain 01/20/2015  . Chronic low back pain 01/18/2015  . Postlaminectomy syndrome, cervical region 01/18/2015  . Lumbar facet arthropathy 01/18/2015  . Lumbar degenerative disc disease 01/18/2015  . Anxiety state 05/16/2013  . Morbid obesity ([place]) 04/15/2013  . Diabetes ([place]) 02/14/2013  . Mild anemia 02/14/2013  . HTN (hypertension) 12/12/2012  . Annual physical exam 12/12/2012  . 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 GERD (gastroesophageal reflux disease)     Past Surgical History:  Procedure Laterality Date  . ANTERIOR CERVICAL DECOMP/DISCECTOMY FUSION N/A 08/27/2013   Procedure: ANTERIOR CERVICAL /DISCECTOMY FUSION (ACDF) C5-C7  2 LEVELS;  Surgeon: Melina Schools, MD;  Location: [place];  Service: Orthopedics;  Laterality: N/A;  . BREAST REDUCTION SURGERY Bilateral 09/28/2014   Procedure: MAMMARY REDUCTION  (BREAST) BILATERAL;  Surgeon: Cristine Polio, MD;  Location: [place];  Service: Plastics;  Laterality: Bilateral;  . CHOLECYSTECTOMY  06/1989  . DILATION AND CURETTAGE OF UTERUS    . HERNIA REPAIR    . KNEE ARTHROSCOPY Left 08/2003  . LAPAROSCOPIC BILATERAL SALPINGECTOMY Bilateral 01/22/2015   Procedure: LAPAROSCOPIC BILATERAL SALPINGECTOMY;  Surgeon: Azucena Fallen, MD;  Location: [facility];  Service: Gynecology;  Laterality: Bilateral;  . LEFT HEART CATHETERIZATION WITH CORONARY ANGIOGRAM N/A 04/15/2013   Procedure: LEFT HEART CATHETERIZATION WITH CORONARY ANGIOGRAM;  Surgeon: Peter M Martinique, MD;  Location: [place] CATH [facility];  Service: Cardiovascular;  Laterality: N/A;  . LIPOSUCTION Bilateral 09/28/2014   Procedure: LIPOSUCTION;  Surgeon: Cristine Polio, MD;  Location: [place];  Service: Plastics;  Laterality: Bilateral;  . ROBOTIC ASSISTED TOTAL HYSTERECTOMY N/A 01/22/2015   has her ovaries---ROBOTIC ASSISTED TOTAL HYSTERECTOMY With Pelvic Washings, Lysis of Adhesions;  Surgeon: Azucena Fallen, MD;   Location: [facility];  Service: Gynecology;  Laterality: N/A;  . SHOULDER ARTHROSCOPY W/ ROTATOR CUFF REPAIR Right 2006, 2007  . TUBAL LIGATION  1991  . UMBILICAL HERNIA REPAIR  2001, 2003  . [place]     OB History   No obstetric history on file.      Home Medications    Prior to Admission medications   Medication Sig Start Date End Date Taking? Authorizing Provider  albuterol (VENTOLIN HFA) 108 (90 Base) MCG/ACT inhaler Inhale 2 puffs into the lungs every 6 (six) hours as needed for wheezing or shortness of breath. 11/06/16   Colon Branch, MD  Ascorbic Acid (VITAMIN C) 100 MG tablet Take 100 mg by mouth 2 (two) times daily.     [provider]  aspirin EC 81 MG tablet Take 81 mg by mouth daily.    [provider]  baclofen (LIORESAL) 10 MG tablet Take 1 tablet as needed at onset of migraine. 01/04/18   Cameron Sprang, MD  budesonide-formoterol [facility]) 160-4.5 MCG/ACT inhaler Inhale 2 puffs into the lungs 2 (two) times daily. Patient taking differently: Inhale 2 puffs into the lungs 2 (two) times daily as needed (shortness of breath).  04/16/15   Colon Branch, MD  canagliflozin [facility]) 300 MG TABS tablet Take 1 tablet (300 mg total) by mouth daily before breakfast. 05/27/18   Colon Branch, MD  cephALEXin (KEFLEX) 500 MG capsule Take 1 capsule (500 mg total) by mouth 4 (four) times daily. 04/02/19   Wardell Honour, MD  cyclobenzaprine (FLEXERIL) 10 MG tablet Take 1 tablet (10 mg total) by mouth 3 (three) times daily as needed. 08/05/18   Sable Feil, PA-C  fexofenadine (ALLEGRA) 180 MG tablet Take 180 mg by mouth daily.    [provider]  folic acid (FOLVITE) 1 MG tablet Take 1 mg by mouth daily.    [provider]  gabapentin (NEURONTIN) 300 MG capsule Take 3 caps at bedtime 01/04/18   Cameron Sprang, MD  ibuprofen (ADVIL) 600 MG tablet Take 1 tablet (600 mg total) by mouth every 6 (six) hours as needed. 12/30/18   Laban Emperor, PA-C  losartan (COZAAR) 100 MG tablet Take 1 tablet (100 mg total) by mouth daily. 05/24/18   Colon Branch, MD  metFORMIN (GLUCOPHAGE) 1000 MG tablet Take 1.5 tablets by mouth every morning and 1 tablet by mouth every evening 09/17/18   Colon Branch, MD  naproxen (NAPROSYN) 375 MG tablet Take 1 tablet (375 mg total) by mouth 2 (two) times daily. 04/08/17   Janne Napoleon, NP  rosuvastatin (CRESTOR) 40 MG tablet Take 1 tablet (40 mg total) by mouth at bedtime. 05/27/18   Colon Branch, MD  sitaGLIPtin (JANUVIA) 100 MG tablet Take 1 tablet (100 mg total) by mouth daily. 02/20/17   Colon Branch, MD  topiramate (TOPAMAX) 50 MG tablet Take 1 tablet in AM, 2 tablets in PM 01/04/18   Cameron Sprang, MD  traMADol (ULTRAM) 50 MG tablet Take 1 tablet (50 mg total) by mouth every 6 (six)  hours as needed. 12/30/18 12/30/19  Laban Emperor, PA-C  vitamin B-12 (CYANOCOBALAMIN) 500 MCG tablet Take 1,000 mcg by mouth daily.     [provider]  zolpidem (AMBIEN) 10 MG tablet Take 0.5-1 tablets (5-10 mg total) by mouth at bedtime as needed for sleep. 08/01/17 08/08/18  Colon Branch, MD    Family History Family History  Problem Relation Age of Onset  . Breast cancer Other         GM  . Diabetes Father        F, B, S  . CAD Father        died of MI @ 29  . Kidney disease Father   . Pancreatic cancer Other        GM  . CAD Sister        MI at age 48  . CAD Brother        CHF, died at age 95  . Heart attack Brother        died suddenly @ 48  . Leukemia Sister   . Colon cancer Neg Hx     Social History Social History   Tobacco Use  . Smoking status: Never Smoker  . Smokeless tobacco: Never Used  Substance Use Topics  . Alcohol use: No    Alcohol/week: 0.0 standard drinks  . Drug use: No     Allergies   Codeine and Vicodin [hydrocodone-acetaminophen]   Review of Systems Review of Systems  Constitutional: Negative for fever.  Respiratory: Negative for shortness of breath.    Cardiovascular: Negative for chest pain.  Musculoskeletal: Positive for myalgias. Negative for arthralgias and gait problem.  Skin: Positive for color change. Negative for wound.  Allergic/Immunologic: Positive for immunocompromised state.  Neurological: Negative for weakness and numbness.  Hematological: Negative for adenopathy. Does not bruise/bleed easily.  All other systems reviewed and are negative.    Physical Exam Updated Vital Signs BP (!) 155/102 (BP Location: Right Arm)   Pulse 81   Temp 98.5 F (36.9 C) (Oral)   Resp 16   Ht 5\' 7"  (1.702 m)   Wt 117.9 kg   LMP 01/16/2015   SpO2 98%   BMI 40.72 kg/m   Physical Exam Vitals signs and nursing note reviewed.  Constitutional:      General: She is not in acute distress.    Appearance: She is well-developed. She is not diaphoretic.  HENT:     Head: Normocephalic and atraumatic.  Cardiovascular:     Pulses: Normal pulses.  Pulmonary:     Effort: Pulmonary effort is normal.  Musculoskeletal:        General: Swelling and tenderness present. No deformity or signs of injury.       Legs:  Skin:    General: Skin is warm and dry.     Findings: Erythema present.  Neurological:     Mental Status: She is alert and oriented to person, place, and time.     Sensory: No sensory deficit.  Psychiatric:        Behavior: Behavior normal.      ED Treatments / Results  Labs (all labs ordered are listed, but only abnormal results are displayed) Labs Reviewed - No data to display  EKG None  Radiology Vas [place] Lower Extremity Venous (dvt) (only [facility])  Result Date: 04/03/2019  Lower Venous Study Indications: Swelling.  Comparison Study: no prior Performing Technologist: Abram Sander RVS  Examination Guidelines: A complete evaluation includes B-mode  imaging, spectral Doppler, color Doppler, and power Doppler as needed of all accessible portions of each vessel. Bilateral testing is considered an integral part of a complete  examination. Limited examinations for reoccurring indications may be performed as noted.  +---------+---------------+---------+-----------+----------+--------------+ RIGHT    CompressibilityPhasicitySpontaneityPropertiesThrombus Aging +---------+---------------+---------+-----------+----------+--------------+ CFV      Full           Yes      Yes                                 +---------+---------------+---------+-----------+----------+--------------+ SFJ      Full                                                        +---------+---------------+---------+-----------+----------+--------------+ FV Prox  Full                                                        +---------+---------------+---------+-----------+----------+--------------+ FV Mid   Full                                                        +---------+---------------+---------+-----------+----------+--------------+ FV DistalFull                                                        +---------+---------------+---------+-----------+----------+--------------+ PFV      Full                                                        +---------+---------------+---------+-----------+----------+--------------+ POP      Full           Yes      Yes                                 +---------+---------------+---------+-----------+----------+--------------+ PTV      Full                                                        +---------+---------------+---------+-----------+----------+--------------+ PERO                                                  Not visualized +---------+---------------+---------+-----------+----------+--------------+   +----+---------------+---------+-----------+----------+--------------+ LEFTCompressibilityPhasicitySpontaneityPropertiesThrombus Aging +----+---------------+---------+-----------+----------+--------------+ CFV Full  Yes      Yes                                  +----+---------------+---------+-----------+----------+--------------+     Summary: Right: There is no evidence of deep vein thrombosis in the lower extremity. No cystic structure found in the popliteal fossa. Left: No evidence of common femoral vein obstruction.  *See table(s) above for measurements and observations.    Preliminary     Procedures Procedures (including critical care time)  Medications Ordered in ED Medications - No data to display   Initial Impression / Assessment and Plan / ED Course  I have reviewed the triage vital signs and the nursing notes.  Pertinent labs & imaging results that were available during my care of the patient were reviewed by me and considered in my medical decision making (see chart for details).  Clinical Course as of Apr 03 1031  Thu Apr 03, 2019  1030 53yo female with complaint of right lower leg redness, pain, swelling. Seen at [facility] yesterday and started on keflex, sent for venous doppler. Venous doppler negative for DVT, suspect cellulitis. Recommend continue with Keflex, apply warm compresses, elevate the leg to help with pain/swelling. Recheck with PCP, return to ER for worsening pain/swelling/redness or any other concerns.    [LM]    Clinical Course User Index [LM] Tacy Learn, PA-C      Final Clinical Impressions(s) / ED Diagnoses   Final diagnoses:  Cellulitis of right lower extremity    ED Discharge Orders    None       Roque Lias 04/03/19 1032    Virgel Manifold, MD 04/03/19 1053

## 2019-04-03 NOTE — Discharge Instructions (Addendum)
Continue taking the Keflex as prescribed. Apply warm compresses to the leg for 30 minutes at a time at least three times daily. Elevate the leg to help with swelling.  Recheck with your doctor, return to the ER for new or worsening symptoms.

## 2019-04-03 NOTE — ED Triage Notes (Signed)
Patient states she was bit by something 4 days ago. Patient c/o redness and swelling since 3 days ago. Patient went to UC yesterday and was told to go to the Ed for an [place]. Patient states she was told that that dept closed at 7 PM. UC provider called in antibiotics for her leg and patient came today for an [place].

## 2019-08-16 LAB — HM DIABETES EYE EXAM

## 2020-01-15 LAB — HM MAMMOGRAPHY

## 2020-01-19 ENCOUNTER — Encounter (HOSPITAL_BASED_OUTPATIENT_CLINIC_OR_DEPARTMENT_OTHER): Payer: Self-pay | Admitting: *Deleted

## 2020-01-19 ENCOUNTER — Emergency Department (HOSPITAL_BASED_OUTPATIENT_CLINIC_OR_DEPARTMENT_OTHER)
Admission: EM | Admit: 2020-01-19 | Discharge: 2020-01-19 | Disposition: A | Discharge status: Home / Self Care | Payer: 59 | Attending: Emergency Medicine | Admitting: Emergency Medicine

## 2020-01-19 ENCOUNTER — Other Ambulatory Visit: Payer: Self-pay

## 2020-01-19 DIAGNOSIS — Z7982 Long term (current) use of aspirin: Secondary | ICD-10-CM | POA: Insufficient documentation

## 2020-01-19 DIAGNOSIS — I10 Essential (primary) hypertension: Secondary | ICD-10-CM | POA: Diagnosis not present

## 2020-01-19 DIAGNOSIS — E119 Type 2 diabetes mellitus without complications: Secondary | ICD-10-CM | POA: Insufficient documentation

## 2020-01-19 DIAGNOSIS — H70011 Subperiosteal abscess of mastoid, right ear: Secondary | ICD-10-CM | POA: Insufficient documentation

## 2020-01-19 DIAGNOSIS — J45909 Unspecified asthma, uncomplicated: Secondary | ICD-10-CM | POA: Diagnosis not present

## 2020-01-19 DIAGNOSIS — Z96652 Presence of left artificial knee joint: Secondary | ICD-10-CM | POA: Diagnosis not present

## 2020-01-19 DIAGNOSIS — H6001 Abscess of right external ear: Secondary | ICD-10-CM | POA: Diagnosis present

## 2020-01-19 DIAGNOSIS — Z79899 Other long term (current) drug therapy: Secondary | ICD-10-CM | POA: Diagnosis not present

## 2020-01-19 DIAGNOSIS — Z7984 Long term (current) use of oral hypoglycemic drugs: Secondary | ICD-10-CM | POA: Insufficient documentation

## 2020-01-19 MED ORDER — LIDOCAINE HCL (PF) 1 % IJ SOLN
5.0000 mL | Freq: Once | INTRAMUSCULAR | Status: AC
  Administered 2020-01-19: 5 mL
  Filled 2020-01-19: qty 5

## 2020-01-19 NOTE — Discharge Instructions (Addendum)
You have been seen here for a abscess of your mastoid, I and D was performed.  I want you to apply warm compresses to the area 3 times a day as this will promote continual drainage of your abscess.  You may apply a bandage please change it frequently.  Keep the area clean and dry.  You can use ibuprofen and Tylenol for pain management please follow dosing on back of bottle.  I want you to follow-up with your primary care doctor in 1 week's time for reevaluation of the wound to ensure it is healing properly.  I want to come back to the emergency department if you develop fever, chills, increased redness, severe pain, chest pain, shortness of breath, uncontrolled nausea, vomiting, diarrhea as the symptoms require further evaluation and management.

## 2020-01-19 NOTE — ED Provider Notes (Signed)
[facility] EMERGENCY DEPARTMENT Provider Note   CSN: [id number] Arrival date & time: 01/19/20  1745     History Chief Complaint  Patient presents with  . Cyst    [name] is a 54 y.o. female.  HPI   Patient presents to the emergency department with chief complaint of abscess behind her right ear.  Patient explains she has had these in the past and they generally form a head and go away on their own.  This abscess has not responded to warm compresses and Epson salt like the others have and is causeing her pain.  She admits that applying pressure to it increases her pain and nothing seems to make it better.  Patient denies headache, fever, chills, discharge, or drainage.  Describes the pain as a constant throbbing-like pain that is worse when she applies pressure to it.  Patient has significant medical history of diabetes, anxiety, GERD, migraines, sickle cell trait.  Patient denies headache, fever, chills, nausea, vomiting, chest pain, shortness of breath, dysuria.  Past Medical History:  Diagnosis Date  . Anxiety state, unspecified 05/16/2013  . Arthritis    "right knee" (08/27/2013)  . Asthma    "only when I get a sinus infection which is 1-2 X/yr" (08/27/2013)  . Chronic pain syndrome   . Diabetes ([place])    "borderline" (08/27/2013)  . Failed back syndrome, cervical   . GERD (gastroesophageal reflux disease)   . Hypertension    no meds  . Lumbar radiculopathy, chronic   . Migraine    "once or twice/month now" (08/27/2013)  . Neck pain    axial, s/p cervical fusion C5-6, C6-7   . Obesity   . Sickle cell trait ([place])   . Wears glasses     Patient Active Problem List   Diagnosis Date Noted  . Unilateral primary osteoarthritis, right knee 03/02/2017  . Impingement syndrome of left shoulder 06/13/2016  . Sacroiliac dysfunction 03/28/2016  . Dyslipidemia 10/18/2015  . PCP NOTES >>>>>>>>>>> 04/01/2015  . S/P laparoscopic hysterectomy 01/22/2015  . Migraine  with aura and without status migrainosus, not intractable 01/20/2015  . Chronic back pain 01/20/2015  . Chronic low back pain 01/18/2015  . Postlaminectomy syndrome, cervical region 01/18/2015  . Lumbar facet arthropathy 01/18/2015  . Lumbar degenerative disc disease 01/18/2015  . Anxiety state 05/16/2013  . Morbid obesity ([place]) 04/15/2013  . Diabetes ([place]) 02/14/2013  . Mild anemia 02/14/2013  . HTN (hypertension) 12/12/2012  . Annual physical exam 12/12/2012  . 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 GERD (gastroesophageal reflux disease)     Past Surgical History:  Procedure Laterality Date  . ANTERIOR CERVICAL DECOMP/DISCECTOMY FUSION N/A 08/27/2013   Procedure: ANTERIOR CERVICAL /DISCECTOMY FUSION (ACDF) C5-C7  2 LEVELS;  Surgeon: Melina Schools, MD;  Location: [facility];  Service: Orthopedics;  Laterality: N/A;  . BREAST REDUCTION SURGERY Bilateral 09/28/2014   Procedure: MAMMARY REDUCTION  (BREAST) BILATERAL;  Surgeon: Cristine Polio, MD;  Location: [facility];  Service: Plastics;  Laterality: Bilateral;  . CHOLECYSTECTOMY  06/1989  . DILATION AND CURETTAGE OF UTERUS    . HERNIA REPAIR    . KNEE ARTHROSCOPY Left 08/2003  . LAPAROSCOPIC BILATERAL SALPINGECTOMY Bilateral 01/22/2015   Procedure: LAPAROSCOPIC BILATERAL SALPINGECTOMY;  Surgeon: Azucena Fallen, MD;  Location: [facility];  Service: Gynecology;  Laterality: Bilateral;  . LEFT HEART CATHETERIZATION WITH CORONARY ANGIOGRAM N/A 04/15/2013   Procedure: LEFT HEART CATHETERIZATION WITH CORONARY ANGIOGRAM;  Surgeon: Peter M Martinique, MD;  Location: [facility]  CATH LAB;  Service: Cardiovascular;  Laterality: N/A;  . LIPOSUCTION Bilateral 09/28/2014   Procedure: LIPOSUCTION;  Surgeon: Cristine Polio, MD;  Location: [place];  Service: Plastics;  Laterality: Bilateral;  . ROBOTIC ASSISTED TOTAL HYSTERECTOMY N/A 01/22/2015   has her ovaries---ROBOTIC ASSISTED TOTAL HYSTERECTOMY With Pelvic Washings, Lysis of Adhesions;  Surgeon:  Azucena Fallen, MD;  Location: [facility];  Service: Gynecology;  Laterality: N/A;  . SHOULDER ARTHROSCOPY W/ ROTATOR CUFF REPAIR Right 2006, 2007  . TUBAL LIGATION  1991  . UMBILICAL HERNIA REPAIR  2001, 2003  . [place]     OB History   No obstetric history on file.     Family History  Problem Relation Age of Onset  . Breast cancer Other         GM  . Diabetes Father        F, B, S  . CAD Father        died of MI @ 52  . Kidney disease Father   . Pancreatic cancer Other        GM  . CAD Sister        MI at age 58  . CAD Brother        CHF, died at age 73  . Heart attack Brother        died suddenly @ 13  . Leukemia Sister   . Colon cancer Neg Hx     Social History   Tobacco Use  . Smoking status: Never Smoker  . Smokeless tobacco: Never Used  Vaping Use  . Vaping Use: Never used  Substance Use Topics  . Alcohol use: No    Alcohol/week: 0.0 standard drinks  . Drug use: No    Home Medications Prior to Admission medications   Medication Sig Start Date End Date Taking? Authorizing Provider  albuterol (VENTOLIN HFA) 108 (90 Base) MCG/ACT inhaler Inhale 2 puffs into the lungs every 6 (six) hours as needed for wheezing or shortness of breath. 11/06/16   Colon Branch, MD  Ascorbic Acid (VITAMIN C) 100 MG tablet Take 100 mg by mouth 2 (two) times daily.     [provider]  aspirin EC 81 MG tablet Take 81 mg by mouth daily.    [provider]  baclofen (LIORESAL) 10 MG tablet Take 1 tablet as needed at onset of migraine. 01/04/18   Cameron Sprang, MD  budesonide-formoterol [facility]) 160-4.5 MCG/ACT inhaler Inhale 2 puffs into the lungs 2 (two) times daily. Patient taking differently: Inhale 2 puffs into the lungs 2 (two) times daily as needed (shortness of breath).  04/16/15   Colon Branch, MD  canagliflozin [facility]) 300 MG TABS tablet Take 1 tablet (300 mg total) by mouth daily before breakfast. 05/27/18   Colon Branch, MD  cephALEXin  (KEFLEX) 500 MG capsule Take 1 capsule (500 mg total) by mouth 4 (four) times daily. 04/02/19   Wardell Honour, MD  cyclobenzaprine (FLEXERIL) 10 MG tablet Take 1 tablet (10 mg total) by mouth 3 (three) times daily as needed. 08/05/18   Sable Feil, PA-C  fexofenadine (ALLEGRA) 180 MG tablet Take 180 mg by mouth daily.    [provider]  folic acid (FOLVITE) 1 MG tablet Take 1 mg by mouth daily.    [provider]  gabapentin (NEURONTIN) 300 MG capsule Take 3 caps at bedtime 01/04/18   Cameron Sprang, MD  ibuprofen (ADVIL) 600 MG  tablet Take 1 tablet (600 mg total) by mouth every 6 (six) hours as needed. 12/30/18   Laban Emperor, PA-C  losartan (COZAAR) 100 MG tablet Take 1 tablet (100 mg total) by mouth daily. 05/24/18   Colon Branch, MD  metFORMIN (GLUCOPHAGE) 1000 MG tablet Take 1.5 tablets by mouth every morning and 1 tablet by mouth every evening 09/17/18   Colon Branch, MD  naproxen (NAPROSYN) 375 MG tablet Take 1 tablet (375 mg total) by mouth 2 (two) times daily. 04/08/17   Janne Napoleon, NP  rosuvastatin (CRESTOR) 40 MG tablet Take 1 tablet (40 mg total) by mouth at bedtime. 05/27/18   Colon Branch, MD  sitaGLIPtin (JANUVIA) 100 MG tablet Take 1 tablet (100 mg total) by mouth daily. 02/20/17   Colon Branch, MD  topiramate (TOPAMAX) 50 MG tablet Take 1 tablet in AM, 2 tablets in PM 01/04/18   Cameron Sprang, MD  vitamin B-12 (CYANOCOBALAMIN) 500 MCG tablet Take 1,000 mcg by mouth daily.     [provider]  zolpidem (AMBIEN) 10 MG tablet Take 0.5-1 tablets (5-10 mg total) by mouth at bedtime as needed for sleep. 08/01/17 08/08/18  Colon Branch, MD    Allergies    Codeine and Vicodin [hydrocodone-acetaminophen]  Review of Systems   Review of Systems  Constitutional: Negative for chills and fever.  HENT: Negative for congestion, ear discharge, ear pain, facial swelling and hearing loss.        Admits to having a abscess behind her right ear.  Respiratory: Negative  for shortness of breath.   Cardiovascular: Negative for chest pain.  Gastrointestinal: Negative for abdominal pain.  Genitourinary: Negative for enuresis.  Musculoskeletal: Negative for back pain.  Skin: Negative for rash.  Neurological: Negative for dizziness, facial asymmetry and headaches.  Hematological: Does not bruise/bleed easily.    Physical Exam Updated Vital Signs BP (!) 156/102 (BP Location: Right Arm)   Pulse 82   Temp 98.9 F (37.2 C) (Oral)   Resp 19   Ht 5' 6.5" (1.689 m)   Wt 117.5 kg   LMP 01/16/2015   SpO2 100%   BMI 41.18 kg/m   Physical Exam Vitals and nursing note reviewed.  Constitutional:      General: She is not in acute distress.    Appearance: Normal appearance. She is not ill-appearing or diaphoretic.  HENT:     Head: Normocephalic and atraumatic.     Comments: Patient's right ear was visualized there was abscess noted, no erythema or swelling noted.  It was tender to palpation, fluctuance was felt, it was warm to the touch, there is no drainage or discharge noted.    Nose: No congestion or rhinorrhea.  Eyes:     General: No scleral icterus.       Right eye: No discharge.        Left eye: No discharge.     Conjunctiva/sclera: Conjunctivae normal.  Pulmonary:     Effort: Pulmonary effort is normal. No respiratory distress.     Breath sounds: Normal breath sounds. No wheezing.  Musculoskeletal:     Cervical back: Neck supple.     Right lower leg: No edema.     Left lower leg: No edema.  Skin:    General: Skin is warm and dry.     Coloration: Skin is not jaundiced or pale.  Neurological:     Mental Status: She is alert and oriented to person, place, and time.  Psychiatric:  Mood and Affect: Mood normal.     ED Results / Procedures / Treatments   Labs (all labs ordered are listed, but only abnormal results are displayed) Labs Reviewed - No data to display  EKG None  Radiology No results found.  Procedures .Marland KitchenIncision and  Drainage  Date/Time: 01/19/2020 11:00 PM Performed by: Marcello Fennel, PA-C Authorized by: Marcello Fennel, PA-C   Consent:    Consent obtained:  Verbal   Consent given by:  Patient   Risks discussed:  Bleeding, incomplete drainage, pain, damage to other organs and infection   Alternatives discussed:  No treatment Location:    Type:  Abscess   Size:  3cm   Location:  Head   Head/neck location: Behind right ear. Pre-procedure details:    Skin preparation:  Betadine Anesthesia (see MAR for exact dosages):    Anesthesia method:  Local infiltration   Local anesthetic:  Lidocaine 1% w/o epi Procedure type:    Complexity:  Simple Procedure details:    Needle aspiration: no     Incision types:  Single straight   Incision depth:  Dermal   Wound management:  Probed and deloculated   Drainage:  Bloody and purulent   Drainage amount:  Moderate   Wound treatment:  Wound left open   Packing materials:  None Post-procedure details:    Patient tolerance of procedure:  Tolerated well, no immediate complications   (including critical care time)  Medications Ordered in ED Medications  lidocaine (PF) (XYLOCAINE) 1 % injection 5 mL (5 mLs Infiltration Given by Other 01/19/20 2146)    ED Course  I have reviewed the triage vital signs and the nursing notes.  Pertinent labs & imaging results that were available during my care of the patient were reviewed by me and considered in my medical decision making (see chart for details).    MDM Rules/Calculators/A&P                          I have personally reviewed all imaging, labs and have interpreted them.  Unlikely patient suffering from cellulitis as patient denies fever, chills, vital signs are reassuring, physical exam was benign there was no erythema, swelling, drainage, or discharge noted.  There was fluctuant noted during palpation, I&D was performed and moderate amount of blood and purulent discharge was expressed from the  excision.  Excision site was patent and draining, patient felt instant relief after the procedure.  Patient was nontoxic-appearing, vital signs reassuring there was no indication for further imaging or lab work.   Patient appears to be resting comfortably in bed showing no acute signs distress.  Vital signs have remained stable does not meet criteria to be admitted to the hospital.  Likely patient suffered from an abscess and I&D was performed for continual drainage.  Recommend patient follows up with her primary care doctor in 1 week's time for wound evaluation.  Patient discussed with attending who agrees assessment and plan.  Patient is given at home care as well as strict return precautions.  Patient verbalized that she understood and agrees the plan. Final Clinical Impression(s) / ED Diagnoses Final diagnoses:  Subperiosteal abscess of mastoid, right ear    Rx / DC Orders ED Discharge Orders    None       Aron Baba 01/19/20 2307    Drenda Freeze, MD 01/20/20 1505

## 2020-01-19 NOTE — ED Triage Notes (Signed)
Cyst behind rt ear since last Wednesday  w pain radiating down neck

## 2020-01-21 ENCOUNTER — Telehealth: Payer: Self-pay

## 2020-01-21 NOTE — Telephone Encounter (Signed)
Spoke w/ PCP- okay to schedule for next week please.

## 2020-01-21 NOTE — Telephone Encounter (Signed)
Needs ED f/u please.

## 2020-01-23 NOTE — Telephone Encounter (Signed)
Appt scheduled 01/29/20.

## 2020-01-29 ENCOUNTER — Encounter: Payer: Self-pay | Admitting: Internal Medicine

## 2020-01-29 ENCOUNTER — Telehealth: Payer: Self-pay | Admitting: Emergency Medicine

## 2020-01-29 ENCOUNTER — Ambulatory Visit: Payer: 59 | Admitting: Internal Medicine

## 2020-01-29 ENCOUNTER — Other Ambulatory Visit: Payer: Self-pay

## 2020-01-29 ENCOUNTER — Other Ambulatory Visit: Payer: 59

## 2020-01-29 VITALS — BP 151/93 | HR 83 | Temp 98.1°F | Resp 18 | Ht 67.0 in | Wt 271.2 lb

## 2020-01-29 DIAGNOSIS — E119 Type 2 diabetes mellitus without complications: Secondary | ICD-10-CM

## 2020-01-29 DIAGNOSIS — I10 Essential (primary) hypertension: Secondary | ICD-10-CM | POA: Diagnosis not present

## 2020-01-29 DIAGNOSIS — Z09 Encounter for follow-up examination after completed treatment for conditions other than malignant neoplasm: Secondary | ICD-10-CM

## 2020-01-29 DIAGNOSIS — E1165 Type 2 diabetes mellitus with hyperglycemia: Secondary | ICD-10-CM

## 2020-01-29 DIAGNOSIS — G43109 Migraine with aura, not intractable, without status migrainosus: Secondary | ICD-10-CM | POA: Diagnosis not present

## 2020-01-29 LAB — COMPREHENSIVE METABOLIC PANEL
ALT: 8 U/L (ref 0–35)
AST: 9 U/L (ref 0–37)
Albumin: 3.6 g/dL (ref 3.5–5.2)
Alkaline Phosphatase: 91 U/L (ref 39–117)
BUN: 14 mg/dL (ref 6–23)
CO2: 26 mEq/L (ref 19–32)
Calcium: 9.3 mg/dL (ref 8.4–10.5)
Chloride: 95 mEq/L — ABNORMAL LOW (ref 96–112)
Creatinine, Ser: 0.82 mg/dL (ref 0.40–1.20)
GFR: 88.1 mL/min (ref >60.00)
Glucose, Bld: 500 mg/dL (ref 70–99)
Potassium: 4.3 mEq/L (ref 3.5–5.1)
Sodium: 130 mEq/L — ABNORMAL LOW (ref 135–145)
Total Bilirubin: 0.5 mg/dL (ref 0.2–1.2)
Total Protein: 7 g/dL (ref 6.0–8.3)

## 2020-01-29 LAB — CBC WITH DIFFERENTIAL/PLATELET
Basophils Absolute: 0 10*3/uL (ref 0.0–0.1)
Basophils Relative: 0.4 % (ref 0.0–3.0)
Eosinophils Absolute: 0.1 10*3/uL (ref 0.0–0.7)
Eosinophils Relative: 0.8 % (ref 0.0–5.0)
HCT: 40 % (ref 36.0–46.0)
Hemoglobin: 13.3 g/dL (ref 12.0–15.0)
Lymphocytes Relative: 27.7 % (ref 12.0–46.0)
Lymphs Abs: 1.8 10*3/uL (ref 0.7–4.0)
MCHC: 33.3 g/dL (ref 30.0–36.0)
MCV: 84.7 fl (ref 78.0–100.0)
Monocytes Absolute: 0.4 10*3/uL (ref 0.1–1.0)
Monocytes Relative: 5.5 % (ref 3.0–12.0)
Neutro Abs: 4.3 10*3/uL (ref 1.4–7.7)
Neutrophils Relative %: 65.6 % (ref 43.0–77.0)
Platelets: 253 10*3/uL (ref 150.0–400.0)
RBC: 4.72 Mil/uL (ref 3.87–5.11)
RDW: 14 % (ref 11.5–15.5)
WBC: 6.5 10*3/uL (ref 4.0–10.5)

## 2020-01-29 MED ORDER — GABAPENTIN 300 MG PO CAPS: ORAL_CAPSULE | ORAL | 3 refills | Status: DC

## 2020-01-29 MED ORDER — TOPIRAMATE 50 MG PO TABS: ORAL_TABLET | ORAL | 0 refills | Status: DC

## 2020-01-29 MED ORDER — ALBUTEROL SULFATE HFA 108 (90 BASE) MCG/ACT IN AERS: 2.0000 | INHALATION_SPRAY | Freq: Four times a day (QID) | RESPIRATORY_TRACT | 5 refills | Status: DC | PRN

## 2020-01-29 MED ORDER — TRESIBA FLEXTOUCH 100 UNIT/ML ~~LOC~~ SOPN: 15.0000 [IU] | PEN_INJECTOR | Freq: Every day | SUBCUTANEOUS | 0 refills | Status: DC

## 2020-01-29 MED ORDER — GLIMEPIRIDE 4 MG PO TABS: 4.0000 mg | ORAL_TABLET | Freq: Every day | ORAL | 3 refills | Status: DC

## 2020-01-29 MED ORDER — BACLOFEN 10 MG PO TABS: ORAL_TABLET | ORAL | 1 refills | Status: DC

## 2020-01-29 MED ORDER — METFORMIN HCL 1000 MG PO TABS: ORAL_TABLET | ORAL | 3 refills | Status: DC

## 2020-01-29 NOTE — Progress Notes (Signed)
Subjective:    Patient ID: [name], female    DOB: Nov 23, 1965, 54 y.o.   MRN: [id number]  DOS:  01/29/2020 Type of visit - description: ER follow-up ER visit 01/19/2020, at the time  she had a abscess behind the right ear.  It was drained, reports she is doing well. She has not been seen here since 2019 consequently we proceeded with a routine checkup as well. She has been without insurance until recently, not taking medications.    Wt Readings from Last 3 Encounters:  01/29/20 271 lb 4 oz (123 kg)  01/19/20 259 lb (117.5 kg)  04/03/19 260 lb (117.9 kg)   BP Readings from Last 3 Encounters:  01/29/20 (!) 151/93  01/19/20 (!) 156/102  04/03/19 (!) 155/102    Review of Systems Denies fever chills No chest pain no difficulty breathing No nausea, vomiting, diarrhea No cough or sputum production  Past Medical History:  Diagnosis Date  . Anxiety state, unspecified 05/16/2013  . Arthritis    "right knee" (08/27/2013)  . Asthma    "only when I get a sinus infection which is 1-2 X/yr" (08/27/2013)  . Chronic pain syndrome   . Diabetes ([place])    "borderline" (08/27/2013)  . Failed back syndrome, cervical   . GERD (gastroesophageal reflux disease)   . Hypertension    no meds  . Lumbar radiculopathy, chronic   . Migraine    "once or twice/month now" (08/27/2013)  . Neck pain    axial, s/p cervical fusion C5-6, C6-7   . Obesity   . Sickle cell trait ([place])   . Wears glasses     Past Surgical History:  Procedure Laterality Date  . ANTERIOR CERVICAL DECOMP/DISCECTOMY FUSION N/A 08/27/2013   Procedure: ANTERIOR CERVICAL /DISCECTOMY FUSION (ACDF) C5-C7  2 LEVELS;  Surgeon: Melina Schools, MD;  Location: [facility];  Service: Orthopedics;  Laterality: N/A;  . BREAST REDUCTION SURGERY Bilateral 09/28/2014   Procedure: MAMMARY REDUCTION  (BREAST) BILATERAL;  Surgeon: Cristine Polio, MD;  Location: [facility];  Service: Plastics;  Laterality: Bilateral;  .  CHOLECYSTECTOMY  06/1989  . DILATION AND CURETTAGE OF UTERUS    . HERNIA REPAIR    . KNEE ARTHROSCOPY Left 08/2003  . LAPAROSCOPIC BILATERAL SALPINGECTOMY Bilateral 01/22/2015   Procedure: LAPAROSCOPIC BILATERAL SALPINGECTOMY;  Surgeon: Azucena Fallen, MD;  Location: [facility];  Service: Gynecology;  Laterality: Bilateral;  . LEFT HEART CATHETERIZATION WITH CORONARY ANGIOGRAM N/A 04/15/2013   Procedure: LEFT HEART CATHETERIZATION WITH CORONARY ANGIOGRAM;  Surgeon: Peter M Martinique, MD;  Location: [facility] CATH LAB;  Service: Cardiovascular;  Laterality: N/A;  . LIPOSUCTION Bilateral 09/28/2014   Procedure: LIPOSUCTION;  Surgeon: Cristine Polio, MD;  Location: [place] [facility];  Service: Plastics;  Laterality: Bilateral;  . ROBOTIC ASSISTED TOTAL HYSTERECTOMY N/A 01/22/2015   has her ovaries---ROBOTIC ASSISTED TOTAL HYSTERECTOMY With Pelvic Washings, Lysis of Adhesions;  Surgeon: Azucena Fallen, MD;  Location: [facility];  Service: Gynecology;  Laterality: N/A;  . SHOULDER ARTHROSCOPY W/ ROTATOR CUFF REPAIR Right 2006, 2007  . TUBAL LIGATION  1991  . UMBILICAL HERNIA REPAIR  2001, 2003  . [place] EXTRACTION  1985    Allergies as of 01/29/2020      Reactions   Codeine    Facial rash , lip swelling    Vicodin [hydrocodone-acetaminophen] Swelling, Rash   Pt is able to take percocet without problems      Medication List       Accurate as  of January 29, 2020 11:59 PM. If you have any questions, ask your nurse or doctor.        STOP taking these medications   budesonide-formoterol 160-4.5 MCG/ACT inhaler Commonly known as: SYMBICORT Stopped by: Kathlene November, MD   canagliflozin 300 MG Tabs tablet Commonly known as: Invokana Stopped by: Kathlene November, MD   cephALEXin 500 MG capsule Commonly known as: KEFLEX Stopped by: Kathlene November, MD   cyclobenzaprine 10 MG tablet Commonly known as: FLEXERIL Stopped by: Kathlene November, MD   ibuprofen 600 MG tablet Commonly known as: ADVIL Stopped by: Kathlene November, MD    naproxen 375 MG tablet Commonly known as: NAPROSYN Stopped by: Kathlene November, MD   sitaGLIPtin 100 MG tablet Commonly known as: Januvia Stopped by: Kathlene November, MD     TAKE these medications   albuterol 108 (90 Base) MCG/ACT inhaler Commonly known as: Ventolin HFA Inhale 2 puffs into the lungs every 6 (six) hours as needed for wheezing or shortness of breath.   aspirin EC 81 MG tablet Take 81 mg by mouth daily.   baclofen 10 MG tablet Commonly known as: LIORESAL Take 1 tablet as needed at onset of migraine.   fexofenadine 180 MG tablet Commonly known as: ALLEGRA Take 180 mg by mouth daily.   folic acid 1 MG tablet Commonly known as: FOLVITE Take 1 mg by mouth daily.   gabapentin 300 MG capsule Commonly known as: NEURONTIN Take 3 caps at bedtime   glimepiride 4 MG tablet Commonly known as: AMARYL Take 1 tablet (4 mg total) by mouth daily with breakfast. Started by: PRICE, KRISTY M   losartan 100 MG tablet Commonly known as: COZAAR Take 1 tablet (100 mg total) by mouth daily.   metFORMIN 1000 MG tablet Commonly known as: GLUCOPHAGE Take 1/2 tablet by mouth twice daily for 1 week, then increase to 1 tablet twice daily What changed: additional instructions Changed by: PRICE, KRISTY M   rosuvastatin 40 MG tablet Commonly known as: Crestor Take 1 tablet (40 mg total) by mouth at bedtime.   topiramate 50 MG tablet Commonly known as: TOPAMAX Take 1 tablet in AM, 2 tablets in PM   Tresiba FlexTouch 100 UNIT/ML FlexTouch Pen Generic drug: insulin degludec Inject 0.15 mLs (15 Units total) into the skin daily. Started by: Kathlene November, MD   vitamin B-12 500 MCG tablet Commonly known as: CYANOCOBALAMIN Take 1,000 mcg by mouth daily.   vitamin C 100 MG tablet Take 100 mg by mouth 2 (two) times daily.   zolpidem 10 MG tablet Commonly known as: Ambien Take 0.5-1 tablets (5-10 mg total) by mouth at bedtime as needed for sleep.          Objective:   Physical Exam BP  (!) 151/93 (BP Location: Left Arm, Patient Position: Sitting, Cuff Size: Normal)   Pulse 83   Temp 98.1 F (36.7 C) (Oral)   Resp 18   Ht 5\' 7"  (1.702 m)   Wt 271 lb 4 oz (123 kg)   LMP 01/16/2015   SpO2 100%   BMI 42.48 kg/m  General:   Well developed, NAD, BMI noted. HEENT:  Normocephalic . Face symmetric, atraumatic Lungs:  CTA B Normal respiratory effort, no intercostal retractions, no accessory muscle use. Heart: RRR,  no murmur.  Lower extremities: no pretibial edema bilaterally  Skin: Area of abscess behind the right ear: Minimal swelling, not red, no openings, no discharge, redness or warmness. Neurologic:  alert & oriented X3.  Speech normal,  gait appropriate for age and unassisted Psych--  Cognition and judgment appear intact.  Cooperative with normal attention span and concentration.  Behavior appropriate. No anxious or depressed appearing.      Assessment      Assessment  HTN dx 04-2017 DM, no neuropathy Dyslipidemia GERD Asthma-- associated with URIs-allergies  Anxiety Morbid Obesity MSK: Dr Erlinda Hong, Dr Letta Pate --DJD. Multiple surgeries.  --Chronic back pain, local injections back (  sees  Dr. Ella Bodo) --Neck pain-- used to see  Dr Rolena Infante, Dr Nelva Bush now seen at [facility] Migraine headaches -- on topamax, baclofen, per Dr Delice Lesch Sickle cell trait CP: cardiac cath 2014 -- normal + FH CAD  PLAN: Compliance: Last visit 07-2018 due to lack of insurance. The only medication she is taking is Topamax. Admits that   she might not be able to afford expensive medications. DM: Supposed to be on Invokana, Metformin, Januvia.  Not taking medications. Cost of medicines is an issue, check A1c, depending on results consider Metformin plus glimepiride. Addendum: CBG more than 500, patient contacted. Tresiba 15 units given today at the office, start Metformin, glimepiride, check CBGs every morning and let us know. HTN: BP elevated, Supposed to be on losartan 100 mg, check  a CMP, CBC, refill with results. High cholesterol: Not taking Crestor, refill with results Morbid obesity: Not making any progress, counseling. Asthma: Currently not using any inhalers, Symbicort cost might be an issue.  Will refill albuterol to be used as needed. Migraines: On Topamax, RF today. RF baclofen and gabapentin, takes prn for migraines RTC 3 weeks  Time is spent today: 45 minutes, reviewing the chart, assessing multiple problems, providing care for CBG > 500   This visit occurred during the SARS-CoV-2 public health emergency.  Safety protocols were in place, including screening questions prior to the visit, additional usage of staff PPE, and extensive cleaning of exam room while observing appropriate contact time as indicated for disinfecting solutions.

## 2020-01-29 NOTE — Telephone Encounter (Signed)
"  CRITICAL VALUE STICKER  CRITICAL VALUE:GLUCOSE 500  RECEIVER (on-site recipient of call):Kaiyah Eber  DATE & TIME NOTIFIED: 01-29-2020 1303  MESSENGER (representative from lab):[facility]  MD NOTIFIED: BLYTH(DOD)  TIME OF NOTIFICATION:1305 RESPONSE: WILL CHECK ON PATIENT

## 2020-01-29 NOTE — Telephone Encounter (Addendum)
Labs reviewed, blood sugar more than 500. She has not been taking her medications, she was essentially asymptomatic. Anion gap is within normal.  Advise patient: CBG is extremely high. -Recommend to come to the office and get  Tresiba 15 units to get her started. -If she is able to check her sugars at home, give her Tresiba samples, we will call her tomorrow to tell her what dose she will need.  If she is unable to check her sugars, I prefer not to put her on insulin -Restart Metformin 1000 mg: Half tablet B.I.D. for 1 week then 1 tablet B.I.D. 1 month supply and 3 refills -Rx glimepiride 4 mg: 1 tablet daily, #30 and 3 refills -Check CBGs twice a day, fasting in the morning and at different times during the day -ER if: Nausea, vomiting, stomach pain, confusion, headache.

## 2020-01-29 NOTE — Progress Notes (Signed)
Pre visit review using our clinic review tool, if applicable. No additional management support is needed unless otherwise documented below in the visit note. 

## 2020-01-29 NOTE — Patient Instructions (Addendum)
Per our records you are due for an eye exam. Please contact your eye doctor to schedule an appointment. Please have them send copies of your office visit notes to [place]. Our fax number is [phone].  Check the  blood pressure 2 or 3 times a week BP GOAL is between 110/65 and  135/85. If it is consistently higher or lower, let me know     GO TO THE LAB : Get the blood work     [place], [place] back for a checkup in 3 weeks   We will refill your diabetes, cholesterol and blood pressure medicines with results.    Regarding blood sugars: Restart Metformin 1000 mg: 1/2 tab twice daily for 1 week then 1 tablet twice daily Start Glimepirde 4mg - 1 tab daily  Check blood sugars twice daily Please call us in the morning with your blood sugar reading.

## 2020-01-29 NOTE — Progress Notes (Signed)
  Advise patient: CBG is extremely high. -Recommend to come to the office and get  Tresiba 15 units to get her started. -If she is able to check her sugars at home, give her Tresiba samples, we will call her tomorrow to tell her what dose she will need.  If she is unable to check her sugars, I prefer not to put her on insulin -Restart Metformin 1000 mg: Half tablet B.I.D. for 1 week then 1 tablet B.I.D. 1 month supply and 3 refills -Rx glimepiride 4 mg: 1 tablet daily, #30 and 3 refills -Check CBGs twice a day, fasting in the morning and at different times during the day -ER if: Nausea, vomiting, stomach pain, confusion, headache.     Pt here today for above. [facility] Tresiba 15 units injected into L arm. Pt tolerated well, instructed her on how to use medication and gave her sample in case she needs it in the morning. I informed her to start oral medications tonight and call us in AM with blood sugar readings. Pt verbalized understanding of all the above.

## 2020-01-29 NOTE — Telephone Encounter (Signed)
Spoke w/ Pt- informed of results, she is feeling fine. She does have a way to check blood sugars at home. Informed of recommendations- she will return to office for Tresiba injection for elevated blood sugars. See OV note from 01/29/2020 for further education/documentation.

## 2020-01-30 ENCOUNTER — Telehealth: Payer: Self-pay | Admitting: Internal Medicine

## 2020-01-30 LAB — HEMOGLOBIN A1C: Hgb A1c MFr Bld: >14 % of total Hgb — ABNORMAL HIGH (ref <5.7)

## 2020-01-30 NOTE — Telephone Encounter (Signed)
FYI. Please advise.

## 2020-01-30 NOTE — Telephone Encounter (Signed)
CBG today in the morning 373. Advise patient: Decrease insulin to 10 units every night. Continue oral medications Hold insulin if morning CBGs are less than 150 Call with readings in 3 days

## 2020-01-30 NOTE — Telephone Encounter (Signed)
Caller: [place] Call back phone number: [phone]  Patient is calling to report blood sugar testing It was taken this morning fasting, no meds   373

## 2020-01-30 NOTE — Telephone Encounter (Signed)
Spoke w/ Pt- informed of recommendations. Pt verbalized understanding.  

## 2020-01-30 NOTE — Assessment & Plan Note (Signed)
Compliance: Last visit 07-2018 due to lack of insurance. The only medication she is taking is Topamax. Admits that   she might not be able to afford expensive medications. DM: Supposed to be on Invokana, Metformin, Januvia.  Not taking medications. Cost of medicines is an issue, check A1c, depending on results consider Metformin plus glimepiride. Addendum: CBG more than 500, patient contacted. Tresiba 15 units given today at the office, start Metformin, glimepiride, check CBGs every morning and let us know. HTN: BP elevated, Supposed to be on losartan 100 mg, check a CMP, CBC, refill with results. High cholesterol: Not taking Crestor, refill with results Morbid obesity: Not making any progress, counseling. Asthma: Currently not using any inhalers, Symbicort cost might be an issue.  Will refill albuterol to be used as needed. Migraines: On Topamax, RF today. RF baclofen and gabapentin, takes prn for migraines RTC 3 weeks

## 2020-02-02 ENCOUNTER — Telehealth: Payer: Self-pay | Admitting: Internal Medicine

## 2020-02-02 NOTE — Telephone Encounter (Signed)
FYI. Please advise.

## 2020-02-02 NOTE — Telephone Encounter (Signed)
Spoke w/ Pt- informed of recommendations. Pt verbalized understanding.  

## 2020-02-02 NOTE — Telephone Encounter (Signed)
Currently on Tresiba 10 units, increase to 14  units every night and call with CBG readings in 3 days.  Other medications the same

## 2020-02-02 NOTE — Telephone Encounter (Signed)
Mariela   Glucose Reading  Saturday  337 before eating  387 after eating   This Morning   Before eat:  278

## 2020-02-05 ENCOUNTER — Telehealth: Payer: Self-pay | Admitting: Internal Medicine

## 2020-02-05 NOTE — Telephone Encounter (Signed)
New message:   Pt is calling in reference to some results from a glucose reading. Please advise.

## 2020-02-05 NOTE — Telephone Encounter (Signed)
Spoke w/ Pt-   Monday- 278 Tuesday- 231 Wednesday (after eating breakfast) 247 Thursday (this morning before breakfast) 226  Please advise.

## 2020-02-05 NOTE — Telephone Encounter (Signed)
Increase Tresiba from from 14 units to 20 units. Call with CBGs in 5 days

## 2020-02-06 MED ORDER — TRESIBA FLEXTOUCH 100 UNIT/ML ~~LOC~~ SOPN: 20.0000 [IU] | PEN_INJECTOR | Freq: Every day | SUBCUTANEOUS | 0 refills | Status: DC

## 2020-02-06 NOTE — Telephone Encounter (Signed)
Spoke w/ Pt- informed of recommendations. [facility].

## 2020-02-09 ENCOUNTER — Telehealth: Payer: Self-pay | Admitting: Internal Medicine

## 2020-02-09 MED ORDER — INSULIN PEN NEEDLE 31G X 8 MM MISC: 1 refills | Status: DC

## 2020-02-09 NOTE — Telephone Encounter (Signed)
Pen needles sent

## 2020-02-09 NOTE — Telephone Encounter (Signed)
Patient states she got insulin degludec (TRESIBA FLEXTOUCH) 100 UNIT/ML FlexTouch Pen However a prescription for the needles need to be send in  [id number] [address], [place], [place] [address]  Phone:  [phone] Fax:  [phone]

## 2020-02-12 ENCOUNTER — Telehealth: Payer: Self-pay | Admitting: Internal Medicine

## 2020-02-12 NOTE — Telephone Encounter (Signed)
Spoke w/ Pt- informed of recommendations. Med list updated. Pt verbalized understanding.

## 2020-02-12 NOTE — Telephone Encounter (Signed)
Increase Tresiba to 25 units nightly and continue the other medications medications, keep follow-up with me on 02/26/2020.  Watch for low sugar symptoms, let me know if that happens

## 2020-02-12 NOTE — Telephone Encounter (Signed)
Glucose Readings     Monday 197  Tuesday 221  Wednesday 221  Thursday 187

## 2020-02-12 NOTE — Telephone Encounter (Signed)
FYI. Please advise.

## 2020-02-13 ENCOUNTER — Emergency Department (HOSPITAL_BASED_OUTPATIENT_CLINIC_OR_DEPARTMENT_OTHER)
Admission: EM | Admit: 2020-02-13 | Discharge: 2020-02-13 | Disposition: A | Discharge status: Home / Self Care | Payer: 59 | Attending: Emergency Medicine | Admitting: Emergency Medicine

## 2020-02-13 ENCOUNTER — Encounter (HOSPITAL_BASED_OUTPATIENT_CLINIC_OR_DEPARTMENT_OTHER): Payer: Self-pay | Admitting: Emergency Medicine

## 2020-02-13 ENCOUNTER — Other Ambulatory Visit: Payer: Self-pay

## 2020-02-13 ENCOUNTER — Emergency Department (HOSPITAL_BASED_OUTPATIENT_CLINIC_OR_DEPARTMENT_OTHER): Payer: 59

## 2020-02-13 ENCOUNTER — Telehealth: Payer: Self-pay | Admitting: Internal Medicine

## 2020-02-13 DIAGNOSIS — R1013 Epigastric pain: Secondary | ICD-10-CM | POA: Diagnosis not present

## 2020-02-13 DIAGNOSIS — I10 Essential (primary) hypertension: Secondary | ICD-10-CM | POA: Insufficient documentation

## 2020-02-13 DIAGNOSIS — K219 Gastro-esophageal reflux disease without esophagitis: Secondary | ICD-10-CM | POA: Insufficient documentation

## 2020-02-13 DIAGNOSIS — J45909 Unspecified asthma, uncomplicated: Secondary | ICD-10-CM | POA: Insufficient documentation

## 2020-02-13 DIAGNOSIS — R072 Precordial pain: Secondary | ICD-10-CM

## 2020-02-13 DIAGNOSIS — Z7951 Long term (current) use of inhaled steroids: Secondary | ICD-10-CM | POA: Insufficient documentation

## 2020-02-13 DIAGNOSIS — Z794 Long term (current) use of insulin: Secondary | ICD-10-CM | POA: Insufficient documentation

## 2020-02-13 DIAGNOSIS — Z7982 Long term (current) use of aspirin: Secondary | ICD-10-CM | POA: Insufficient documentation

## 2020-02-13 DIAGNOSIS — E119 Type 2 diabetes mellitus without complications: Secondary | ICD-10-CM | POA: Insufficient documentation

## 2020-02-13 DIAGNOSIS — Z79899 Other long term (current) drug therapy: Secondary | ICD-10-CM | POA: Insufficient documentation

## 2020-02-13 DIAGNOSIS — R079 Chest pain, unspecified: Secondary | ICD-10-CM | POA: Diagnosis present

## 2020-02-13 LAB — CBC WITH DIFFERENTIAL/PLATELET
Abs Immature Granulocytes: 0.02 10*3/uL (ref 0.00–0.07)
Basophils Absolute: 0 10*3/uL (ref 0.0–0.1)
Basophils Relative: 0 %
Eosinophils Absolute: 0.1 10*3/uL (ref 0.0–0.5)
Eosinophils Relative: 1 %
HCT: 37.7 % (ref 36.0–46.0)
Hemoglobin: 12.6 g/dL (ref 12.0–15.0)
Immature Granulocytes: 0 %
Lymphocytes Relative: 32 %
Lymphs Abs: 2.3 10*3/uL (ref 0.7–4.0)
MCH: 27.7 pg (ref 26.0–34.0)
MCHC: 33.4 g/dL (ref 30.0–36.0)
MCV: 82.9 fL (ref 80.0–100.0)
Monocytes Absolute: 0.5 10*3/uL (ref 0.1–1.0)
Monocytes Relative: 6 %
Neutro Abs: 4.3 10*3/uL (ref 1.7–7.7)
Neutrophils Relative %: 61 %
Platelets: 261 10*3/uL (ref 150–400)
RBC: 4.55 MIL/uL (ref 3.87–5.11)
RDW: 13.4 % (ref 11.5–15.5)
WBC: 7.3 10*3/uL (ref 4.0–10.5)
nRBC: 0 % (ref 0.0–0.2)

## 2020-02-13 LAB — COMPREHENSIVE METABOLIC PANEL
ALT: 17 U/L (ref 0–44)
AST: 22 U/L (ref 15–41)
Albumin: 3.4 g/dL — ABNORMAL LOW (ref 3.5–5.0)
Alkaline Phosphatase: 65 U/L (ref 38–126)
Anion gap: 11 (ref 5–15)
BUN: 11 mg/dL (ref 6–20)
CO2: 25 mmol/L (ref 22–32)
Calcium: 9.6 mg/dL (ref 8.9–10.3)
Chloride: 100 mmol/L (ref 98–111)
Creatinine, Ser: 0.67 mg/dL (ref 0.44–1.00)
GFR calc Af Amer: >60 mL/min (ref >60)
GFR calc non Af Amer: >60 mL/min (ref >60)
Glucose, Bld: 180 mg/dL — ABNORMAL HIGH (ref 70–99)
Potassium: 3.9 mmol/L (ref 3.5–5.1)
Sodium: 136 mmol/L (ref 135–145)
Total Bilirubin: 0.5 mg/dL (ref 0.3–1.2)
Total Protein: 7 g/dL (ref 6.5–8.1)

## 2020-02-13 LAB — LIPASE, BLOOD: Lipase: 30 U/L (ref 11–51)

## 2020-02-13 LAB — TROPONIN I (HIGH SENSITIVITY): Troponin I (High Sensitivity): 4 ng/L (ref <18)

## 2020-02-13 MED ORDER — ALUM & MAG HYDROXIDE-SIMETH 200-200-20 MG/5ML PO SUSP
30.0000 mL | Freq: Once | ORAL | Status: AC
  Administered 2020-02-13: 30 mL via ORAL
  Filled 2020-02-13: qty 30

## 2020-02-13 MED ORDER — PANTOPRAZOLE SODIUM 40 MG IV SOLR
40.0000 mg | Freq: Once | INTRAVENOUS | Status: AC
  Administered 2020-02-13: 40 mg via INTRAVENOUS
  Filled 2020-02-13: qty 40

## 2020-02-13 MED ORDER — SODIUM CHLORIDE 0.9 % IV BOLUS
1000.0000 mL | Freq: Once | INTRAVENOUS | Status: AC
  Administered 2020-02-13: 1000 mL via INTRAVENOUS

## 2020-02-13 MED ORDER — LIDOCAINE VISCOUS HCL 2 % MT SOLN
15.0000 mL | Freq: Once | OROMUCOSAL | Status: AC
  Administered 2020-02-13: 15 mL via ORAL
  Filled 2020-02-13: qty 15

## 2020-02-13 MED ORDER — PANTOPRAZOLE SODIUM 20 MG PO TBEC
20.0000 mg | DELAYED_RELEASE_TABLET | Freq: Every day | ORAL | 1 refills | Status: DC
Start: 2020-02-13 — End: 2021-06-08

## 2020-02-13 MED FILL — PANTOPRAZOLE SOD DR 20 MG T: 20 | 30 days supply | Qty: 30 | Fill #0

## 2020-02-13 NOTE — ED Triage Notes (Signed)
Reports epigastric pain for the last 10 days.  Unable to sleep the last 4 nights due to increase in pain when lying down.  Hx of acid reflux but hasn't been on anything for years.

## 2020-02-13 NOTE — ED Provider Notes (Signed)
[facility] EMERGENCY DEPARTMENT Provider Note   CSN: [id number] Arrival date & time: 02/13/20  1407     History Chief Complaint  Patient presents with  . Chest Pain    [name] is a 54 y.o. female with past medical history significant for asthma, chronic pain syndrome, diabetes, GERD, hypertension, diet-controlled who presents for evaluation of epigastric and chest pain.  Symptoms began 10 days ago.  States symptoms are worse at night when she lays flat.  Feels like she is burping and belching.  States prior to 10 days ago she had not been on anything chronically for her acid reflux.  Has been using Tums, ginger tea, Pepsi without relief.  She has no exertional or pleuritic chest pain.  No radiation to left arm, left jaw.  No associated diaphoresis, nausea, vomiting.  No lateral leg swelling, redness or warmth.  No recent surgery, immobilization.  Pain does not radiate into her back.  Describes pain as a burning and aching.  Pain rated a 6/10.  Denies any midline abdominal pain radiating to back.  Pain improves when she sits up.  No personal history of cardiac issues however does have family history of MI in their 70s.  Has not been seen for this issue previously.  Denies aggravating or relieving factors. Able to walk long distances without CP, SOB.  History obtained from patient and past medical records. No interpretor was used.   HPI     Past Medical History:  Diagnosis Date  . Anxiety state, unspecified 05/16/2013  . Arthritis    "right knee" (08/27/2013)  . Asthma    "only when I get a sinus infection which is 1-2 X/yr" (08/27/2013)  . Chronic pain syndrome   . Diabetes ([place])    "borderline" (08/27/2013)  . Failed back syndrome, cervical   . GERD (gastroesophageal reflux disease)   . Hypertension    no meds  . Lumbar radiculopathy, chronic   . Migraine    "once or twice/month now" (08/27/2013)  . Neck pain    axial, s/p cervical fusion C5-6, C6-7   . Obesity     . Sickle cell trait ([place])   . Wears glasses     Patient Active Problem List   Diagnosis Date Noted  . Unilateral primary osteoarthritis, right knee 03/02/2017  . Impingement syndrome of left shoulder 06/13/2016  . Sacroiliac dysfunction 03/28/2016  . Dyslipidemia 10/18/2015  . PCP NOTES >>>>>>>>>>> 04/01/2015  . S/P laparoscopic hysterectomy 01/22/2015  . Migraine with aura and without status migrainosus, not intractable 01/20/2015  . Chronic back pain 01/20/2015  . Chronic low back pain 01/18/2015  . Postlaminectomy syndrome, cervical region 01/18/2015  . Lumbar facet arthropathy 01/18/2015  . Lumbar degenerative disc disease 01/18/2015  . Anxiety state 05/16/2013  . Morbid obesity ([place]) 04/15/2013  . Diabetes ([place]) 02/14/2013  . Mild anemia 02/14/2013  . HTN (hypertension) 12/12/2012  . Annual physical exam 12/12/2012  . 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 GERD (gastroesophageal reflux disease)     Past Surgical History:  Procedure Laterality Date  . ANTERIOR CERVICAL DECOMP/DISCECTOMY FUSION N/A 08/27/2013   Procedure: ANTERIOR CERVICAL /DISCECTOMY FUSION (ACDF) C5-C7  2 LEVELS;  Surgeon: Melina Schools, MD;  Location: [facility];  Service: Orthopedics;  Laterality: N/A;  . BREAST REDUCTION SURGERY Bilateral 09/28/2014   Procedure: MAMMARY REDUCTION  (BREAST) BILATERAL;  Surgeon: Cristine Polio, MD;  Location: [facility];  Service: Plastics;  Laterality: Bilateral;  . CHOLECYSTECTOMY  06/1989  .  DILATION AND CURETTAGE OF UTERUS    . HERNIA REPAIR    . KNEE ARTHROSCOPY Left 08/2003  . LAPAROSCOPIC BILATERAL SALPINGECTOMY Bilateral 01/22/2015   Procedure: LAPAROSCOPIC BILATERAL SALPINGECTOMY;  Surgeon: Azucena Fallen, MD;  Location: [facility];  Service: Gynecology;  Laterality: Bilateral;  . LEFT HEART CATHETERIZATION WITH CORONARY ANGIOGRAM N/A 04/15/2013   Procedure: LEFT HEART CATHETERIZATION WITH CORONARY ANGIOGRAM;  Surgeon: Peter M Martinique, MD;  Location: [facility] C[facility] LAB;   Service: Cardiovascular;  Laterality: N/A;  . LIPOSUCTION Bilateral 09/28/2014   Procedure: LIPOSUCTION;  Surgeon: Cristine Polio, MD;  Location: [facility];  Service: Plastics;  Laterality: Bilateral;  . ROBOTIC ASSISTED TOTAL HYSTERECTOMY N/A 01/22/2015   has her ovaries---ROBOTIC ASSISTED TOTAL HYSTERECTOMY With Pelvic Washings, Lysis of Adhesions;  Surgeon: Azucena Fallen, MD;  Location: [facility];  Service: Gynecology;  Laterality: N/A;  . SHOULDER ARTHROSCOPY W/ ROTATOR CUFF REPAIR Right 2006, 2007  . TUBAL LIGATION  1991  . UMBILICAL HERNIA REPAIR  2001, 2003  . [place]     OB History   No obstetric history on file.     Family History  Problem Relation Age of Onset  . Breast cancer Other         GM  . Diabetes Father        F, B, S  . CAD Father        died of MI @ 36  . Kidney disease Father   . Pancreatic cancer Other        GM  . CAD Sister        MI at age 36  . CAD Brother        CHF, died at age 47  . Heart attack Brother        died suddenly @ 93  . Leukemia Sister   . Colon cancer Neg Hx     Social History   Tobacco Use  . Smoking status: Never Smoker  . Smokeless tobacco: Never Used  Vaping Use  . Vaping Use: Never used  Substance Use Topics  . Alcohol use: No    Alcohol/week: 0.0 standard drinks  . Drug use: No    Home Medications Prior to Admission medications   Medication Sig Start Date End Date Taking? Authorizing Provider  albuterol (VENTOLIN HFA) 108 (90 Base) MCG/ACT inhaler Inhale 2 puffs into the lungs every 6 (six) hours as needed for wheezing or shortness of breath. 01/29/20  Yes Paz, Alda Berthold, MD  Ascorbic Acid (VITAMIN C) 100 MG tablet Take 100 mg by mouth 2 (two) times daily.    Yes [provider]  aspirin EC 81 MG tablet Take 81 mg by mouth daily.   Yes [provider]  folic acid (FOLVITE) 1 MG tablet Take 1 mg by mouth daily.   Yes [provider]  gabapentin (NEURONTIN)  300 MG capsule Take 3 caps at bedtime Patient taking differently: Take 900 mg by mouth at bedtime.  01/29/20  Yes Paz, Alda Berthold, MD  glimepiride (AMARYL) 4 MG tablet Take 1 tablet (4 mg total) by mouth daily with breakfast. 01/29/20  Yes Paz, Alda Berthold, MD  insulin degludec (TRESIBA FLEXTOUCH) 100 UNIT/ML FlexTouch Pen Inject 25 Units into the skin at bedtime.  02/12/20  Yes Paz, Alda Berthold, MD  metFORMIN (GLUCOPHAGE) 1000 MG tablet Take 1/2 tablet by mouth twice daily for 1 week, then increase to 1 tablet twice daily Patient taking differently: Take 1,000 mg  by mouth 2 (two) times daily with a meal.  01/29/20  Yes Paz, Alda Berthold, MD  oxymetazoline [facility] NASAL SPRAY FULL FORCE) 0.05 % nasal spray Place 1 spray into both nostrils 2 (two) times daily as needed for congestion.   Yes [provider]  SALINE NASAL MIST NA Place 1 spray into the nose as needed (congestion).   Yes [provider]  topiramate (TOPAMAX) 50 MG tablet Take 1 tablet in AM, 2 tablets in PM Patient taking differently: Take 50-100 mg by mouth See admin instructions. Take 34m in the morning and 1064mat bedtime. 01/29/20  Yes Paz, JoAlda BertholdMD  vitamin B-12 (CYANOCOBALAMIN) 500 MCG tablet Take 1,000 mcg by mouth daily.    Yes [provider]  baclofen (LIORESAL) 10 MG tablet Take 1 tablet as needed at onset of migraine. 01/29/20   PaColon BranchMD  Insulin Pen Needle 31G X 8 MM MISC Use w/ TrTyler Aas/2/21   PaColon BranchMD  pantoprazole (PROTONIX) 20 MG tablet Take 1 tablet (20 mg total) by mouth daily. 02/13/20   Delron Comer A, PA-C    Allergies    Codeine and Vicodin [hydrocodone-acetaminophen]  Review of Systems   Review of Systems  Constitutional: Negative.   HENT: Negative.   Respiratory: Negative.   Cardiovascular: Negative for chest pain, palpitations and leg swelling.  Gastrointestinal: Positive for abdominal pain. Negative for abdominal distention, anal bleeding, blood in stool, constipation, diarrhea,  nausea, rectal pain and vomiting.  Genitourinary: Negative.   Musculoskeletal: Negative.   Skin: Negative.   Neurological: Negative.   All other systems reviewed and are negative.   Physical Exam Updated Vital Signs BP (!) 145/92 (BP Location: Right Arm)   Pulse 73   Temp 98.6 F (37 C) (Oral)   Resp 18   Ht _0  (1.676 m)   Wt 122 kg   LMP 01/16/2015   SpO2 100%   BMI 43.42 kg/m   Physical Exam Vitals and nursing note reviewed.  Constitutional:      General: She is not in acute distress.    Appearance: She is well-developed. She is not ill-appearing, toxic-appearing or diaphoretic.  HENT:     Head: Normocephalic and atraumatic.  Eyes:     Pupils: Pupils are equal, round, and reactive to light.  Cardiovascular:     Rate and Rhythm: Normal rate.     Pulses:          Radial pulses are 2+ on the right side and 2+ on the left side.       Dorsalis pedis pulses are 2+ on the right side and 2+ on the left side.  Pulmonary:     Effort: Pulmonary effort is normal. No tachypnea, accessory muscle usage or respiratory distress.     Breath sounds: Normal breath sounds.  Chest:       Comments: Equal rise and fall of chest.  No crepitus, step-offs.  No bony tenderness Abdominal:     General: Bowel sounds are normal. There is no distension.     Palpations: Abdomen is soft.     Tenderness: There is abdominal tenderness in the epigastric area. There is no right CVA tenderness, left CVA tenderness, guarding or rebound. Negative signs include Murphy's sign.     Hernia: No hernia is present.       Comments: Tenderness to epigastric region.  No pulsatile midline abdominal mass.  No rebound or guarding.  Musculoskeletal:  General: Normal range of motion.     Cervical back: Normal range of motion.     Comments: Moves all 4 extremities at difficulty.  Compartments soft. No bony tenderness  Skin:    General: Skin is warm and dry.     Capillary Refill: Capillary refill takes less  than 2 seconds.     Comments: No edema, erythema or warmth.  No fluctuance or induration.  Neurological:     General: No focal deficit present.     Mental Status: She is alert.     Cranial Nerves: Cranial nerves are intact.     Sensory: Sensation is intact.     Motor: Motor function is intact.     Gait: Gait is intact.    ED Results / Procedures / Treatments   Labs (all labs ordered are listed, but only abnormal results are displayed) Labs Reviewed  COMPREHENSIVE METABOLIC PANEL - Abnormal; Notable for the following components:      Result Value   Glucose, Bld 180 (*)    Albumin 3.4 (*)    All other components within normal limits  CBC WITH DIFFERENTIAL/PLATELET  LIPASE, BLOOD  TROPONIN I (HIGH SENSITIVITY)    EKG EKG Interpretation  Date/Time:  Friday February 13 2020 14:16:13 EDT Ventricular Rate:  81 PR Interval:  170 QRS Duration: 74 QT Interval:  376 QTC Calculation: 436 R Axis:   32 Text Interpretation: Normal sinus rhythm Normal ECG No significant change since last tracing Confirmed by Lajean Saver [phone]) on 02/13/2020 2:23:50 PM   Radiology DG Chest Portable 1 View  Result Date: 02/13/2020 CLINICAL DATA:  Chest pain EXAM: PORTABLE CHEST 1 VIEW COMPARISON:  July 05, 2017 FINDINGS: Lungs are clear. Heart size and pulmonary vascularity are normal. No adenopathy. No pneumothorax. There is postoperative change in the lower cervical region. IMPRESSION: Lungs clear.  Cardiac silhouette within normal limits. Electronically Signed   By: Lowella Grip III M.D.   On: 02/13/2020 15:17    Procedures Procedures (including critical care time)  Medications Ordered in ED Medications  sodium chloride 0.9 % bolus 1,000 mL (1,000 mLs Intravenous New Bag/Given 02/13/20 1522)  pantoprazole (PROTONIX) injection 40 mg (40 mg Intravenous Given 02/13/20 1524)  alum & mag hydroxide-simeth (MAALOX/MYLANTA) 200-200-20 MG/5ML suspension 30 mL (30 mLs Oral Given 02/13/20 1518)    And    lidocaine (XYLOCAINE) 2 % viscous mouth solution 15 mL (15 mLs Oral Given 02/13/20 1518)   ED Course  I have reviewed the triage vital signs and the nursing notes.  Pertinent labs & imaging results that were available during my care of the patient were reviewed by me and considered in my medical decision making (see chart for details).  54 year old presents for evaluation of CP and epigastric pain x 10 days. Afebrile non septic, non ill appearing. Able to reproduce pain on exam.  Does have worsening pain after eating which is resolved with belching. Worse with laying flat. No radiation of pain. No N/V, diaphoresis. No exertional or pleuritic CP. No clinical evidence of DVT on exam.  No right upper quadrant abdominal pain.  No rebound or guarding.  Murphy sign negative.  No rebound or guarding.  Heart and lungs clear.  Compartments soft.  Neurovascularly intact.  Plan on labs, imaging and reassess.  Labs and imaging personally reviewed and interpreted: EKG with ST/T changes.  No STEMI Plain film chest without evidence of cardiomegaly, pulmonary MAC, pneumothorax Lipase 30 CBC without leukocytosis metabolic panel with mild  hyperglycemia to 180, she is diabetic and insulin-dependent however anion gap 11 have low suspicion for DKA. Troponin 4, given symptoms x10 days do not feel any additional troponin at this time.  If likely ACS related troponin would be elevated.  Patient reassessed after Protonix and GI cocktail.  Significant improvement in pain.  Reevaluation abdomen soft, nontender.  Continued negative Murphy sign.  Symptoms likely related to GERD.  Low suspicion for ACS, PE, dissection, infectious process, intra-abdominal pathology.  Will have her starton antacids and follow-up with PCP.  Chest pain is not likely of cardiac or pulmonary etiology d/t presentation, PERC negative, VSS, no tracheal deviation, no JVD or new murmur, RRR, breath sounds equal bilaterally, EKG without acute  abnormalities, negative troponin, and negative CXR. Pt has been advised to return to the ED if CP becomes exertional, associated with diaphoresis or nausea, radiates to left jaw/arm, worsens or becomes concerning in any way. Patient does not meet the SIRS or Sepsis criteria.  On repeat exam patient does not have a surgical abdomin and there are no peritoneal signs.  No indication of appendicitis, bowel obstruction, bowel perforation, cholecystitis, diverticulitis, PID or ectopic pregnancy.   Wells criteria low risk, cannot PERC 2/2 age. Heart score 2 (Age and risk factors)   The patient has been appropriately medically screened and/or stabilized in the ED. I have low suspicion for any other emergent medical condition which would require further screening, evaluation or treatment in the ED or require inpatient management.  Patient is hemodynamically stable and in no acute distress.  Patient able to ambulate in department prior to ED.  Evaluation does not show acute pathology that would require ongoing or additional emergent interventions while in the emergency department or further inpatient treatment.  I have discussed the diagnosis with the patient and answered all questions.  Pain is been managed while in the emergency department and patient has no further complaints prior to discharge.  Patient is comfortable with plan discussed in room and is stable for discharge at this time.  I have discussed strict return precautions for returning to the emergency department.  Patient was encouraged to follow-up with PCP/specialist refer to at discharge.    MDM Rules/Calculators/A&P                           Final Clinical Impression(s) / ED Diagnoses Final diagnoses:  Precordial pain  Epigastric pain  Gastroesophageal reflux disease, unspecified whether esophagitis present    Rx / DC Orders ED Discharge Orders         Ordered    pantoprazole (PROTONIX) 20 MG tablet  Daily     Discontinue  Reprint      02/13/20 1618           Holdan Stucke A, PA-C 02/13/20 1626    Lajean Saver, MD 02/16/20 1432

## 2020-02-13 NOTE — ED Notes (Signed)
Pt reports substernal chest pressure that is worse after eating and  palliated by belching. Pain is not reproducible with palpation and is non-radiating. Denies ShOB.

## 2020-02-13 NOTE — Telephone Encounter (Signed)
[place]  Call Back # [phone]  Patient requesting a call back from Dr. Ethel Rana CMA.

## 2020-02-13 NOTE — Telephone Encounter (Signed)
Pt in ED w/ chest pain.

## 2020-02-13 NOTE — Discharge Instructions (Signed)
Start taking the Protonix.  Follow up with your Primary care provider  Return for new or worsening symptoms such as personal chest pain, sweating with chest pain, shortness of breath, unilateral leg swelling, redness or warmth.

## 2020-02-16 NOTE — Telephone Encounter (Signed)
Pain not felt to be cardiac rather GI

## 2020-02-26 ENCOUNTER — Ambulatory Visit (INDEPENDENT_AMBULATORY_CARE_PROVIDER_SITE_OTHER): Payer: 59 | Admitting: Internal Medicine

## 2020-02-26 ENCOUNTER — Encounter: Payer: Self-pay | Admitting: Internal Medicine

## 2020-02-26 ENCOUNTER — Other Ambulatory Visit: Payer: Self-pay

## 2020-02-26 VITALS — BP 146/94 | HR 80 | Temp 97.9°F | Resp 16 | Ht 67.0 in | Wt 277.5 lb

## 2020-02-26 DIAGNOSIS — Z0000 Encounter for general adult medical examination without abnormal findings: Secondary | ICD-10-CM | POA: Diagnosis not present

## 2020-02-26 DIAGNOSIS — E785 Hyperlipidemia, unspecified: Secondary | ICD-10-CM | POA: Diagnosis not present

## 2020-02-26 DIAGNOSIS — E119 Type 2 diabetes mellitus without complications: Secondary | ICD-10-CM | POA: Diagnosis not present

## 2020-02-26 LAB — LIPID PANEL
Cholesterol: 222 mg/dL — ABNORMAL HIGH (ref 0–200)
HDL: 52.5 mg/dL (ref >39.00)
LDL Cholesterol: 149 mg/dL — ABNORMAL HIGH (ref 0–99)
NonHDL: 169.93
Total CHOL/HDL Ratio: 4
Triglycerides: 106 mg/dL (ref 0.0–149.0)
VLDL: 21.2 mg/dL (ref 0.0–40.0)

## 2020-02-26 LAB — TSH: TSH: 1.13 u[IU]/mL (ref 0.35–4.50)

## 2020-02-26 NOTE — Progress Notes (Signed)
Subjective:    Patient ID: [name], female    DOB: 1966/03/20, 54 y.o.   MRN: [id number]  DOS:  02/26/2020 Type of visit - description: CPX We also talk about other issues including chronic medical problems ER visit  Went to the ER 02/13/2020, CP/epigastric pain for 10 days, EKG non-STEMI, CBG 180, troponin negative.  Improved after GI cocktail.  Released home.     Review of Systems  Other than above, a 14 point review of systems is negative       Past Medical History:  Diagnosis Date  . Anxiety state, unspecified 05/16/2013  . Arthritis    "right knee" (08/27/2013)  . Asthma    "only when I get a sinus infection which is 1-2 X/yr" (08/27/2013)  . Chronic pain syndrome   . Diabetes ([place])    "borderline" (08/27/2013)  . Failed back syndrome, cervical   . GERD (gastroesophageal reflux disease)   . Hypertension    no meds  . Lumbar radiculopathy, chronic   . Migraine    "once or twice/month now" (08/27/2013)  . Neck pain    axial, s/p cervical fusion C5-6, C6-7   . Obesity   . Sickle cell trait ([place])   . Wears glasses     Past Surgical History:  Procedure Laterality Date  . ANTERIOR CERVICAL DECOMP/DISCECTOMY FUSION N/A 08/27/2013   Procedure: ANTERIOR CERVICAL /DISCECTOMY FUSION (ACDF) C5-C7  2 LEVELS;  Surgeon: Melina Schools, MD;  Location: [place];  Service: Orthopedics;  Laterality: N/A;  . BREAST REDUCTION SURGERY Bilateral 09/28/2014   Procedure: MAMMARY REDUCTION  (BREAST) BILATERAL;  Surgeon: Cristine Polio, MD;  Location: [place];  Service: Plastics;  Laterality: Bilateral;  . CHOLECYSTECTOMY  06/1989  . DILATION AND CURETTAGE OF UTERUS    . HERNIA REPAIR    . KNEE ARTHROSCOPY Left 08/2003  . LAPAROSCOPIC BILATERAL SALPINGECTOMY Bilateral 01/22/2015   Procedure: LAPAROSCOPIC BILATERAL SALPINGECTOMY;  Surgeon: Azucena Fallen, MD;  Location: [facility];  Service: Gynecology;  Laterality: Bilateral;  . LEFT HEART CATHETERIZATION WITH CORONARY  ANGIOGRAM N/A 04/15/2013   Procedure: LEFT HEART CATHETERIZATION WITH CORONARY ANGIOGRAM;  Surgeon: Peter M Martinique, MD;  [facility] CATH LAB;  Service: Cardiovascular;  Laterality: N/A;  . LIPOSUCTION Bilateral 09/28/2014   Procedure: LIPOSUCTION;  Surgeon: Cristine Polio, MD;  Location: [place];  Service: Plastics;  Laterality: Bilateral;  . ROBOTIC ASSISTED TOTAL HYSTERECTOMY N/A 01/22/2015   has her ovaries---ROBOTIC ASSISTED TOTAL HYSTERECTOMY With Pelvic Washings, Lysis of Adhesions;  Surgeon: Azucena Fallen, MD;  Location: [facility];  Service: Gynecology;  Laterality: N/A;  . SHOULDER ARTHROSCOPY W/ ROTATOR CUFF REPAIR Right 2006, 2007  . TUBAL LIGATION  1991  . UMBILICAL HERNIA REPAIR  2001, 2003  . [place] EXTRACTION  1985    Allergies as of 02/26/2020      Reactions   Codeine    Facial rash , lip swelling    Vicodin [hydrocodone-acetaminophen] Swelling, Rash   Pt is able to take percocet without problems      Medication List       Accurate as of February 26, 2020 11:59 PM. If you have any questions, ask your nurse or doctor.        albuterol 108 (90 Base) MCG/ACT inhaler Commonly known as: Ventolin HFA Inhale 2 puffs into the lungs every 6 (six) hours as needed for wheezing or shortness of breath.   aspirin EC 81 MG tablet Take 81 mg by mouth  daily.   baclofen 10 MG tablet Commonly known as: LIORESAL Take 1 tablet as needed at onset of migraine.   folic acid 1 MG tablet Commonly known as: FOLVITE Take 1 mg by mouth daily.   gabapentin 300 MG capsule Commonly known as: NEURONTIN Take 3 caps at bedtime What changed:   how much to take  how to take this  when to take this  additional instructions   glimepiride 4 MG tablet Commonly known as: AMARYL Take 1 tablet (4 mg total) by mouth daily with breakfast.   Insulin Pen Needle 31G X 8 MM Misc Use w/ Tyler Aas   metFORMIN 1000 MG tablet Commonly known as: GLUCOPHAGE Take 1 tablet  (1,000 mg total) by mouth 2 (two) times daily with a meal.   Mucinex Nasal Spray Full Force 0.05 % nasal spray Generic drug: oxymetazoline Place 1 spray into both nostrils 2 (two) times daily as needed for congestion.   pantoprazole 20 MG tablet Commonly known as: PROTONIX Take 1 tablet (20 mg total) by mouth daily.   SALINE NASAL MIST NA Place 1 spray into the nose as needed (congestion).   topiramate 50 MG tablet Commonly known as: TOPAMAX Take 1 tablet in AM, 2 tablets in PM What changed:   how much to take  how to take this  when to take this  additional instructions   Tresiba FlexTouch 100 UNIT/ML FlexTouch Pen Generic drug: insulin degludec Inject 25 Units into the skin at bedtime.   vitamin B-12 500 MCG tablet Commonly known as: CYANOCOBALAMIN Take 1,000 mcg by mouth daily.   vitamin C 100 MG tablet Take 100 mg by mouth 2 (two) times daily.          Objective:   Physical Exam BP (!) 146/94 (BP Location: Left Arm, Patient Position: Sitting, Cuff Size: Large)   Pulse 80   Temp 97.9 F (36.6 C) (Oral)   Resp 16   Ht 5\' 7"  (1.702 m)   Wt 277 lb 8 oz (125.9 kg)   LMP 01/16/2015   SpO2 98%   BMI 43.46 kg/m  General: Well developed, NAD, BMI noted HEENT:  Normocephalic . Face symmetric, atraumatic Lungs:  CTA B Normal respiratory effort, no intercostal retractions, no accessory muscle use. Heart: RRR,  no murmur.  Abdomen:  Not distended, soft, non-tender. No rebound or rigidity.   Lower extremities: no pretibial edema bilaterally  Skin: Exposed areas without rash. Not pale. Not jaundice Neurologic:  alert & oriented X3.  Speech normal, gait appropriate for age and unassisted Strength symmetric and appropriate for age.  Psych: Cognition and judgment appear intact.  Cooperative with normal attention span and concentration.  Behavior appropriate. No anxious or depressed appearing.     Assessment        Assessment  HTN dx 04-2017 DM, no  neuropathy Dyslipidemia GERD Asthma-- associated with URIs-allergies  Anxiety Morbid Obesity MSK: Dr Erlinda Hong, Dr Letta Pate --DJD. Multiple surgeries.  --Chronic back pain, local injections back (  sees  Dr. Ella Bodo) --Neck pain-- used to see  Dr Rolena Infante, Dr Nelva Bush now seen at [facility] Migraine headaches -- on topamax, baclofen, per Dr Delice Lesch Sickle cell trait CP: cardiac cath 2014 -- normal + FH CAD  PLAN: Here for CPX DM: Currently on Metformin twice a day, glimepiride, Tresiba 25 units daily.  Ambulatory CBGs in the 1 22-150.  No SX C/W low sugar, occasionally gets slightly nauseous if she skips a meal (recommend to check CBGs during those sxs). No  change, check A1c on RTC HTN: Improved, at home 143 / 90s.  Closer to goal, reassess on RTC, do not like to introduce too many changes in a short bridge of time ( will need to statins d/t h/o DM). Dyslipidemia: Check FLP Asthma, migraines: Stable, well controlled. RTC 2 months   This visit occurred during the SARS-CoV-2 public health emergency.  Safety protocols were in place, including screening questions prior to the visit, additional usage of staff PPE, and extensive cleaning of exam room while observing appropriate contact time as indicated for disinfecting solutions.

## 2020-02-26 NOTE — Patient Instructions (Addendum)
Per our records you are due for an eye exam. Please contact your eye doctor to schedule an appointment. Please have them send copies of your office visit notes to [place]. Our fax number is [phone].  Continue checking your blood sugars As long as they remain between 120 and 150 were doing great. Watch for low sugar symptoms, let me know if that is a problem.  Continue checking your blood pressures BP GOAL is between 110/65 and  135/85. If it is consistently higher or lower, let me know   GO TO THE LAB : Get the blood work     [place], [place] back for a checkup in 2 months

## 2020-02-26 NOTE — Progress Notes (Signed)
Pre visit review using our clinic review tool, if applicable. No additional management support is needed unless otherwise documented below in the visit note. 

## 2020-02-27 ENCOUNTER — Encounter: Payer: Self-pay | Admitting: Internal Medicine

## 2020-02-27 NOTE — Assessment & Plan Note (Signed)
Here for CPX DM: Currently on Metformin twice a day, glimepiride, Tresiba 25 units daily.  Ambulatory CBGs in the 1 22-150.  No SX C/W low sugar, occasionally gets slightly nauseous if she skips a meal (recommend to check CBGs during those sxs). No change, check A1c on RTC HTN: Improved, at home 143 / 90s.  Closer to goal, reassess on RTC, do not like to introduce too many changes in a short bridge of time ( will need to statins d/t h/o DM). Dyslipidemia: Check FLP Asthma, migraines: Stable, well controlled. RTC 2 months

## 2020-02-27 NOTE — Assessment & Plan Note (Signed)
-  Tdap 2014; pnm shot-2016; prevnar 2017  -Covid shot hesitant, discussed pros and cons, encouraged to proceed. - rec flu shot   . -CCS: Never had a cscope, has a cologuard kit rx by Gyn --Female care: saw gyn July 2021, h/o abn PAPs, s/p hysterectomy, no oophorectomy, had a PAP per pt   MMG  Per  [facility] : 01/15/2020 - (+) FH: DM,  breast cancer and CAD. - Labs:  FLP, TSH -Diet-exercise discussed

## 2020-03-01 MED ORDER — ATORVASTATIN CALCIUM 40 MG PO TABS
40.0000 mg | ORAL_TABLET | Freq: Every day | ORAL | 3 refills | Status: DC
Start: 2020-03-01 — End: 2020-09-24

## 2020-03-01 MED FILL — ATORVASTATIN CALCIUM 40 MG: 40 | 30 days supply | Qty: 30 | Fill #0

## 2020-03-01 NOTE — Addendum Note (Signed)
Addended byDamita Dunnings D on: 03/01/2020 07:51 AM   Modules accepted: Orders

## 2020-04-27 ENCOUNTER — Other Ambulatory Visit: Payer: Self-pay | Admitting: Internal Medicine

## 2020-04-30 ENCOUNTER — Ambulatory Visit: Payer: 59 | Admitting: Internal Medicine

## 2020-05-10 ENCOUNTER — Ambulatory Visit: Payer: 59 | Admitting: Internal Medicine

## 2020-05-20 ENCOUNTER — Ambulatory Visit: Payer: 59 | Admitting: Internal Medicine

## 2020-05-20 DIAGNOSIS — Z0289 Encounter for other administrative examinations: Secondary | ICD-10-CM

## 2020-05-21 ENCOUNTER — Encounter: Payer: Self-pay | Admitting: Internal Medicine

## 2020-05-28 ENCOUNTER — Other Ambulatory Visit: Payer: Self-pay | Admitting: Internal Medicine

## 2020-05-28 ENCOUNTER — Encounter: Payer: Self-pay | Admitting: Internal Medicine

## 2020-05-28 ENCOUNTER — Ambulatory Visit: Payer: 59 | Admitting: Internal Medicine

## 2020-05-28 ENCOUNTER — Other Ambulatory Visit: Payer: Self-pay

## 2020-05-28 VITALS — BP 164/103 | HR 83 | Temp 97.9°F | Ht 67.0 in | Wt 276.0 lb

## 2020-05-28 DIAGNOSIS — E785 Hyperlipidemia, unspecified: Secondary | ICD-10-CM

## 2020-05-28 DIAGNOSIS — Z23 Encounter for immunization: Secondary | ICD-10-CM

## 2020-05-28 DIAGNOSIS — E119 Type 2 diabetes mellitus without complications: Secondary | ICD-10-CM

## 2020-05-28 DIAGNOSIS — I10 Essential (primary) hypertension: Secondary | ICD-10-CM

## 2020-05-28 MED ORDER — LOSARTAN POTASSIUM 50 MG PO TABS
50.0000 mg | ORAL_TABLET | Freq: Every day | ORAL | 1 refills | Status: DC
Start: 2020-05-28 — End: 2020-06-14

## 2020-05-28 MED FILL — LOSARTAN POTASSIUM 50 MG TA: 50 | 30 days supply | Qty: 30 | Fill #0

## 2020-05-28 NOTE — Progress Notes (Signed)
Subjective:    Patient ID: [name], female    DOB: 04/02/1966, 54 y.o.   MRN: [id number]  DOS:  05/28/2020 Type of visit - description: Follow-up  Today we talk about diabetes, high cholesterol and HTN. We talk about her ambulatory BPs and CBGs. Good compliance with medications.  Unfortunately, she lost her daughter in September. She is getting counseling from her pastor. Denies any suicidal ideas.  "Taking it day by day".  Review of Systems See above   Past Medical History:  Diagnosis Date  . Anxiety state, unspecified 05/16/2013  . Arthritis    "right knee" (08/27/2013)  . Asthma    "only when I get a sinus infection which is 1-2 X/yr" (08/27/2013)  . Chronic pain syndrome   . Diabetes ([place])    "borderline" (08/27/2013)  . Failed back syndrome, cervical   . GERD (gastroesophageal reflux disease)   . Hypertension    no meds  . Lumbar radiculopathy, chronic   . Migraine    "once or twice/month now" (08/27/2013)  . Neck pain    axial, s/p cervical fusion C5-6, C6-7   . Obesity   . Sickle cell trait ([place])   . Wears glasses     Past Surgical History:  Procedure Laterality Date  . ANTERIOR CERVICAL DECOMP/DISCECTOMY FUSION N/A 08/27/2013   Procedure: ANTERIOR CERVICAL /DISCECTOMY FUSION (ACDF) C5-C7  2 LEVELS;  Surgeon: Melina Schools, MD;  Location: [place];  Service: Orthopedics;  Laterality: N/A;  . BREAST REDUCTION SURGERY Bilateral 09/28/2014   Procedure: MAMMARY REDUCTION  (BREAST) BILATERAL;  Surgeon: Cristine Polio, MD;  Location: [place];  Service: Plastics;  Laterality: Bilateral;  . CHOLECYSTECTOMY  06/1989  . DILATION AND CURETTAGE OF UTERUS    . HERNIA REPAIR    . KNEE ARTHROSCOPY Left 08/2003  . LAPAROSCOPIC BILATERAL SALPINGECTOMY Bilateral 01/22/2015   Procedure: LAPAROSCOPIC BILATERAL SALPINGECTOMY;  Surgeon: Azucena Fallen, MD;  Location: [facility];  Service: Gynecology;  Laterality: Bilateral;  . LEFT HEART CATHETERIZATION WITH  CORONARY ANGIOGRAM N/A 04/15/2013   Procedure: LEFT HEART CATHETERIZATION WITH CORONARY ANGIOGRAM;  Surgeon: Peter M Martinique, MD;  Location[facility] CATH LAB;  Service: Cardiovascular;  Laterality: N/A;  . LIPOSUCTION Bilateral 09/28/2014   Procedure: LIPOSUCTION;  Surgeon: Cristine Polio, MD;  Location: [facility];  Service: Plastics;  Laterality: Bilateral;  . ROBOTIC ASSISTED TOTAL HYSTERECTOMY N/A 01/22/2015   has her ovaries---ROBOTIC ASSISTED TOTAL HYSTERECTOMY With Pelvic Washings, Lysis of Adhesions;  Surgeon: Azucena Fallen, MD;  Location: [facility];  Service: Gynecology;  Laterality: N/A;  . SHOULDER ARTHROSCOPY W/ ROTATOR CUFF REPAIR Right 2006, 2007  . TUBAL LIGATION  1991  . UMBILICAL HERNIA REPAIR  2001, 2003  . [place] EXTRACTION  1985    Allergies as of 05/28/2020      Reactions   Codeine    Facial rash , lip swelling    Vicodin [hydrocodone-acetaminophen] Swelling, Rash   Pt is able to take percocet without problems      Medication List       Accurate as of May 28, 2020 11:59 PM. If you have any questions, ask your nurse or doctor.        albuterol 108 (90 Base) MCG/ACT inhaler Commonly known as: Ventolin HFA Inhale 2 puffs into the lungs every 6 (six) hours as needed for wheezing or shortness of breath.   aspirin EC 81 MG tablet Take 81 mg by mouth daily.   atorvastatin 40 MG tablet  Commonly known as: LIPITOR Take 1 tablet (40 mg total) by mouth at bedtime.   baclofen 10 MG tablet Commonly known as: LIORESAL Take 1 tablet as needed at onset of migraine.   folic acid 1 MG tablet Commonly known as: FOLVITE Take 1 mg by mouth daily.   gabapentin 300 MG capsule Commonly known as: NEURONTIN Take 3 caps at bedtime What changed:   how much to take  how to take this  when to take this  additional instructions   glimepiride 4 MG tablet Commonly known as: AMARYL Take 1 tablet (4 mg total) by mouth daily with breakfast.   Insulin Pen  Needle 31G X 8 MM Misc Use w/ Tresiba   losartan 50 MG tablet Commonly known as: COZAAR Take 1 tablet (50 mg total) by mouth daily. Started by: Kathlene November, MD   metFORMIN 1000 MG tablet Commonly known as: GLUCOPHAGE Take 1 tablet (1,000 mg total) by mouth 2 (two) times daily with a meal.   Mucinex Nasal Spray Full Force 0.05 % nasal spray Generic drug: oxymetazoline Place 1 spray into both nostrils 2 (two) times daily as needed for congestion.   pantoprazole 20 MG tablet Commonly known as: PROTONIX Take 1 tablet (20 mg total) by mouth daily.   SALINE NASAL MIST NA Place 1 spray into the nose as needed (congestion).   topiramate 50 MG tablet Commonly known as: TOPAMAX Take 1 tablet in AM, 2 tablets in PM What changed:   how much to take  how to take this  when to take this  additional instructions   Tresiba FlexTouch 100 UNIT/ML FlexTouch Pen Generic drug: insulin degludec Inject 25 Units into the skin daily.   vitamin B-12 500 MCG tablet Commonly known as: CYANOCOBALAMIN Take 1,000 mcg by mouth daily.   vitamin C 100 MG tablet Take 100 mg by mouth 2 (two) times daily.          Objective:   Physical Exam BP (!) 164/103 (BP Location: Right Arm, Patient Position: Sitting, Cuff Size: Large)   Pulse 83   Temp 97.9 F (36.6 C) (Oral)   Ht 5\' 7"  (1.702 m)   Wt 276 lb (125.2 kg)   LMP 01/16/2015   SpO2 100%   BMI 43.23 kg/m  General:   Well developed, NAD, BMI noted. HEENT:  Normocephalic . Face symmetric, atraumatic Lungs:  CTA B Normal respiratory effort, no intercostal retractions, no accessory muscle use. Heart: RRR,  no murmur.  DM foot exam: No edema, good pedal pulses, pinprick examination normal, skin normal Neurologic:  alert & oriented X3.  Speech normal, gait appropriate for age and unassisted Psych--  Cognition and judgment appear intact.  Cooperative with normal attention span and concentration.  Behavior appropriate. No anxious or  depressed appearing.      Assessment     ASSESSMENT HTN dx 04-2017 DM, no neuropathy Dyslipidemia GERD Asthma-- associated with URIs-allergies  Anxiety Morbid Obesity MSK: Dr Erlinda Hong, Dr Letta Pate --DJD. Multiple surgeries.  --Chronic back pain, local injections back (  sees  Dr. Ella Bodo) --Neck pain-- used to see  Dr Rolena Infante, Dr Nelva Bush now seen at [facility] Migraine headaches -- on topamax, baclofen, per Dr Delice Lesch Sickle cell trait CP: cardiac cath 2014 -- normal + FH CAD  PLAN: DM: Last A1c was 14, currently on insulin Metformin and glimepiride.  CBGs now in the 150s and occasionally 220.  Check A1c.  Foot exam normal HTN: Currently on no medication, checking labs today, BP is  elevated, start losartan 50 mg, BMP in 2 weeks. High cholesterol: Started Lipitor 40 mg based on last cholesterol panel, on 02/29/2020.  Check FLP. Social: Lost her 45 year old daughter in September, getting support from her pastor.  Condolences provided. Preventive care: Flu shot provided, again declines strongly COVID vaccination RTC 2 weeks labs RTC 3 months checkup   This visit occurred during the SARS-CoV-2 public health emergency.  Safety protocols were in place, including screening questions prior to the visit, additional usage of staff PPE, and extensive cleaning of exam room while observing appropriate contact time as indicated for disinfecting solutions.

## 2020-05-28 NOTE — Patient Instructions (Addendum)
Start taking losartan 50 mg every day  Other medications the same  Check the  blood pressure 2 or 3 times a week. BP GOAL is between 110/65 and  135/85. If it is consistently higher or lower, let me know   GO TO THE FRONT DESK, [place] back for   blood work in 2 weeks  Come back for a checkup in 3 months

## 2020-05-29 LAB — HEMOGLOBIN A1C
Hgb A1c MFr Bld: 10.2 % of total Hgb — ABNORMAL HIGH (ref <5.7)
Mean Plasma Glucose: 246 (calc)
eAG (mmol/L): 13.6 (calc)

## 2020-05-29 LAB — LIPID PANEL
Cholesterol: 262 mg/dL — ABNORMAL HIGH (ref <200)
HDL: 70 mg/dL (ref >=50)
LDL Cholesterol (Calc): 169 mg/dL (calc) — ABNORMAL HIGH
Non-HDL Cholesterol (Calc): 192 mg/dL (calc) — ABNORMAL HIGH (ref <130)
Total CHOL/HDL Ratio: 3.7 (calc) (ref <5.0)
Triglycerides: 105 mg/dL (ref <150)

## 2020-05-29 LAB — COMPREHENSIVE METABOLIC PANEL
AG Ratio: 1.1 (calc) (ref 1.0–2.5)
ALT: 10 U/L (ref 6–29)
AST: 13 U/L (ref 10–35)
Albumin: 4 g/dL (ref 3.6–5.1)
Alkaline phosphatase (APISO): 77 U/L (ref 37–153)
BUN: 14 mg/dL (ref 7–25)
CO2: 27 mmol/L (ref 20–32)
Calcium: 9.6 mg/dL (ref 8.6–10.4)
Chloride: 102 mmol/L (ref 98–110)
Creat: 0.71 mg/dL (ref 0.50–1.05)
Globulin: 3.7 g/dL (calc) (ref 1.9–3.7)
Glucose, Bld: 123 mg/dL — ABNORMAL HIGH (ref 65–99)
Potassium: 3.8 mmol/L (ref 3.5–5.3)
Sodium: 138 mmol/L (ref 135–146)
Total Bilirubin: 0.4 mg/dL (ref 0.2–1.2)
Total Protein: 7.7 g/dL (ref 6.1–8.1)

## 2020-05-30 NOTE — Assessment & Plan Note (Addendum)
DM: Last A1c was 14, currently on insulin Metformin and glimepiride.  CBGs now in the 150s and occasionally 220.  Check A1c.  Foot exam normal HTN: Currently on no medication, checking labs today, BP is elevated, start losartan 50 mg, BMP in 2 weeks. High cholesterol: Started Lipitor 40 mg based on last cholesterol panel, on 02/29/2020.  Check FLP. Social: Lost her 53 year old daughter in September, getting support from her pastor.  Condolences provided. Preventive care: Flu shot provided, again declines strongly COVID vaccination RTC 2 weeks labs RTC 3 months checkup

## 2020-06-02 ENCOUNTER — Telehealth: Payer: Self-pay | Admitting: Internal Medicine

## 2020-06-02 DIAGNOSIS — E1165 Type 2 diabetes mellitus with hyperglycemia: Secondary | ICD-10-CM

## 2020-06-02 NOTE — Telephone Encounter (Signed)
Patient calling back in reference to  Dr Ethel Rana call  From yesterday about her results. Patient states voice message was  left to call back.

## 2020-06-02 NOTE — Telephone Encounter (Signed)
Currently on insulin 25 units at night. Takes Metformin and glimepiride in the morning. CBG at times is in the 80s.  Please arrange a referral to Endo (Might benefit from multiple insulin injections versus other strategies)  She also started losartan, BP yesterday 147/99, improving.

## 2020-06-02 NOTE — Telephone Encounter (Signed)
Endo referral placed. 

## 2020-06-11 ENCOUNTER — Other Ambulatory Visit: Payer: Self-pay

## 2020-06-11 ENCOUNTER — Other Ambulatory Visit (INDEPENDENT_AMBULATORY_CARE_PROVIDER_SITE_OTHER): Payer: 59

## 2020-06-11 DIAGNOSIS — I10 Essential (primary) hypertension: Secondary | ICD-10-CM

## 2020-06-11 LAB — BASIC METABOLIC PANEL
BUN: 18 mg/dL (ref 6–23)
CO2: 27 mEq/L (ref 19–32)
Calcium: 9.5 mg/dL (ref 8.4–10.5)
Chloride: 102 mEq/L (ref 96–112)
Creatinine, Ser: 0.78 mg/dL (ref 0.40–1.20)
GFR: 86.49 mL/min (ref >60.00)
Glucose, Bld: 145 mg/dL — ABNORMAL HIGH (ref 70–99)
Potassium: 4 mEq/L (ref 3.5–5.1)
Sodium: 138 mEq/L (ref 135–145)

## 2020-06-14 MED ORDER — LOSARTAN POTASSIUM 50 MG PO TABS: 50.0000 mg | ORAL_TABLET | Freq: Every day | ORAL | 1 refills | Status: DC

## 2020-06-14 NOTE — Addendum Note (Signed)
Addended by: Damita Dunnings D on: 06/14/2020 10:00 AM   Modules accepted: Orders

## 2020-08-04 ENCOUNTER — Other Ambulatory Visit: Payer: Self-pay

## 2020-08-04 ENCOUNTER — Encounter: Payer: Self-pay | Admitting: Emergency Medicine

## 2020-08-04 ENCOUNTER — Ambulatory Visit
Admission: EM | Admit: 2020-08-04 | Discharge: 2020-08-04 | Disposition: A | Discharge status: Home / Self Care | Payer: 59 | Attending: Urgent Care | Admitting: Urgent Care

## 2020-08-04 ENCOUNTER — Ambulatory Visit (INDEPENDENT_AMBULATORY_CARE_PROVIDER_SITE_OTHER): Payer: 59

## 2020-08-04 DIAGNOSIS — M25511 Pain in right shoulder: Secondary | ICD-10-CM

## 2020-08-04 DIAGNOSIS — S46911A Strain of unspecified muscle, fascia and tendon at shoulder and upper arm level, right arm, initial encounter: Secondary | ICD-10-CM | POA: Diagnosis not present

## 2020-08-04 DIAGNOSIS — Z9889 Other specified postprocedural states: Secondary | ICD-10-CM

## 2020-08-04 MED ORDER — TRAMADOL HCL 50 MG PO TABS: 50.0000 mg | ORAL_TABLET | Freq: Four times a day (QID) | ORAL | 0 refills | Status: DC | PRN

## 2020-08-04 NOTE — ED Triage Notes (Signed)
Pt here for right shoulder pain; pt sts hx of rc sx x 2 and felt "pop" when she picked up water today

## 2020-08-04 NOTE — Discharge Instructions (Signed)
Please schedule Tylenol at 500 mg - 650 mg once every 6 hours as needed for aches and pains.  If you still have pain despite taking Tylenol regularly, this is breakthrough pain.  You can use tramadol once every 6 hours for this.  Once your pain is better controlled, switch back to just Tylenol.  

## 2020-08-04 NOTE — ED Provider Notes (Signed)
[place]   MRN: [id number] DOB: Dec 29, 1965  Subjective:   [name] is a 55 y.o. female presenting for acute onset of right shoulder pain today.  Patient was picking up the pack of water and felt a sudden sharp pain with a pop in her right shoulder.  Has had significant difficulty moving it.  Has a history of impingement syndrome, history of 2 rotator cuff surgeries in 2006 and 2007.  Has a history of chronic pain.  No current facility-administered medications for this encounter.  Current Outpatient Medications:  .  albuterol (VENTOLIN HFA) 108 (90 Base) MCG/ACT inhaler, Inhale 2 puffs into the lungs every 6 (six) hours as needed for wheezing or shortness of breath., Disp: 18 g, Rfl: 5 .  Ascorbic Acid (VITAMIN C) 100 MG tablet, Take 100 mg by mouth 2 (two) times daily. , Disp: , Rfl:  .  aspirin EC 81 MG tablet, Take 81 mg by mouth daily., Disp: , Rfl:  .  atorvastatin (LIPITOR) 40 MG tablet, Take 1 tablet (40 mg total) by mouth at bedtime., Disp: 30 tablet, Rfl: 3 .  baclofen (LIORESAL) 10 MG tablet, Take 1 tablet as needed at onset of migraine., Disp: 20 each, Rfl: 1 .  folic acid (FOLVITE) 1 MG tablet, Take 1 mg by mouth daily., Disp: , Rfl:  .  gabapentin (NEURONTIN) 300 MG capsule, Take 3 caps at bedtime (Patient taking differently: Take 900 mg by mouth at bedtime. ), Disp: 90 capsule, Rfl: 3 .  glimepiride (AMARYL) 4 MG tablet, Take 1 tablet (4 mg total) by mouth daily with breakfast., Disp: 30 tablet, Rfl: 3 .  insulin degludec (TRESIBA FLEXTOUCH) 100 UNIT/ML FlexTouch Pen, Inject 25 Units into the skin daily., Disp: 15 mL, Rfl: 1 .  Insulin Pen Needle 31G X 8 MM MISC, Use w/ Tyler Aas, Disp: 100 each, Rfl: 1 .  losartan (COZAAR) 50 MG tablet, Take 1 tablet (50 mg total) by mouth daily., Disp: 90 tablet, Rfl: 1 .  metFORMIN (GLUCOPHAGE) 1000 MG tablet, Take 1 tablet (1,000 mg total) by mouth 2 (two) times daily with a meal., Disp: , Rfl:  .  oxymetazoline (MUCINEX  NASAL SPRAY FULL FORCE) 0.05 % nasal spray, Place 1 spray into both nostrils 2 (two) times daily as needed for congestion., Disp: , Rfl:  .  pantoprazole (PROTONIX) 20 MG tablet, Take 1 tablet (20 mg total) by mouth daily., Disp: 30 tablet, Rfl: 1 .  SALINE NASAL MIST NA, Place 1 spray into the nose as needed (congestion)., Disp: , Rfl:  .  topiramate (TOPAMAX) 50 MG tablet, Take 1 tablet in AM, 2 tablets in PM (Patient taking differently: Take 50-100 mg by mouth See admin instructions. Take 50mg  in the morning and 100mg  at bedtime.), Disp: 270 tablet, Rfl: 0 .  vitamin B-12 (CYANOCOBALAMIN) 500 MCG tablet, Take 1,000 mcg by mouth daily. , Disp: , Rfl:    Allergies  Allergen Reactions  . Codeine     Facial rash , lip swelling   . Vicodin [Hydrocodone-Acetaminophen] Swelling and Rash    Pt is able to take percocet without problems    Past Medical History:  Diagnosis Date  . Anxiety state, unspecified 05/16/2013  . Arthritis    "right knee" (08/27/2013)  . Asthma    "only when I get a sinus infection which is 1-2 X/yr" (08/27/2013)  . Chronic pain syndrome   . Diabetes ([place])    "borderline" (08/27/2013)  . Failed back syndrome, cervical   .  GERD (gastroesophageal reflux disease)   . Hypertension    no meds  . Lumbar radiculopathy, chronic   . Migraine    "once or twice/month now" (08/27/2013)  . Neck pain    axial, s/p cervical fusion C5-6, C6-7   . Obesity   . Sickle cell trait ([facility])   . Wears glasses      Past Surgical History:  Procedure Laterality Date  . ANTERIOR CERVICAL DECOMP/DISCECTOMY FUSION N/A 08/27/2013   Procedure: ANTERIOR CERVICAL /DISCECTOMY FUSION (ACDF) C5-C7  2 LEVELS;  Surgeon: Venita Lick, MD;  Location: [facility];  Service: Orthopedics;  Laterality: N/A;  . BREAST REDUCTION SURGERY Bilateral 09/28/2014   Procedure: MAMMARY REDUCTION  (BREAST) BILATERAL;  Surgeon: Louisa Second, MD;  Location: [facility];  Service: Plastics;  Laterality:  Bilateral;  . CHOLECYSTECTOMY  06/1989  . DILATION AND CURETTAGE OF UTERUS    . HERNIA REPAIR    . KNEE ARTHROSCOPY Left 08/2003  . LAPAROSCOPIC BILATERAL SALPINGECTOMY Bilateral 01/22/2015   Procedure: LAPAROSCOPIC BILATERAL SALPINGECTOMY;  Surgeon: Shea Evans, MD;  Location: [facility];  Service: Gynecology;  Laterality: Bilateral;  . LEFT HEART CATHETERIZATION WITH CORONARY ANGIOGRAM N/A 04/15/2013   Procedure: LEFT HEART CATHETERIZATION WITH CORONARY ANGIOGRAM;  Surgeon: Peter M Swaziland, MD;  Location: [facility] CATH LAB;  Service: Cardiovascular;  Laterality: N/A;  . LIPOSUCTION Bilateral 09/28/2014   Procedure: LIPOSUCTION;  Surgeon: Louisa Second, MD;  Location: [facility];  Service: Plastics;  Laterality: Bilateral;  . ROBOTIC ASSISTED TOTAL HYSTERECTOMY N/A 01/22/2015   has her ovaries---ROBOTIC ASSISTED TOTAL HYSTERECTOMY With Pelvic Washings, Lysis of Adhesions;  Surgeon: Shea Evans, MD;  Location: [facility];  Service: Gynecology;  Laterality: N/A;  . SHOULDER ARTHROSCOPY W/ ROTATOR CUFF REPAIR Right 2006, 2007  . TUBAL LIGATION  1991  . UMBILICAL HERNIA REPAIR  2001, 2003  . WISDOM TOOTH EXTRACTION  1985    Family History  Problem Relation Age of Onset  . Breast cancer Other         GM  . Diabetes Father        F, B, S  . CAD Father        died of MI @ 46  . Kidney disease Father   . Pancreatic cancer Other        GM  . CAD Sister        MI at age 29  . CAD Brother        CHF, died at age 31  . Heart attack Brother        died suddenly @ 54  . Leukemia Sister   . Colon cancer Neg Hx     Social History   Tobacco Use  . Smoking status: Never Smoker  . Smokeless tobacco: Never Used  Vaping Use  . Vaping Use: Never used  Substance Use Topics  . Alcohol use: No    Alcohol/week: 0.0 standard drinks  . Drug use: No    ROS   Objective:   Vitals: BP (!) 198/113 (BP Location: Left Arm)   Pulse 93   Temp 98.7 F (37.1 C) (Oral)   Resp 18   LMP  01/16/2015   SpO2 95%   Physical Exam Constitutional:      General: She is not in acute distress.    Appearance: Normal appearance. She is well-developed. She is not ill-appearing.  HENT:     Head: Normocephalic and atraumatic.     Nose: Nose normal.  Mouth/Throat:     Mouth: Mucous membranes are moist.     Pharynx: Oropharynx is clear.  Eyes:     General: No scleral icterus.    Extraocular Movements: Extraocular movements intact.     Pupils: Pupils are equal, round, and reactive to light.  Cardiovascular:     Rate and Rhythm: Normal rate.  Pulmonary:     Effort: Pulmonary effort is normal.  Musculoskeletal:     Right shoulder: Tenderness (anterior and lateral deltoids, AC joint) present. No swelling, deformity, effusion, laceration, bony tenderness or crepitus. Decreased range of motion. Decreased strength.  Skin:    General: Skin is warm and dry.  Neurological:     General: No focal deficit present.     Mental Status: She is alert and oriented to person, place, and time.  Psychiatric:        Mood and Affect: Mood normal.        Behavior: Behavior normal.     DG Shoulder Right  Result Date: 08/04/2020 CLINICAL DATA:  Right shoulder injury. EXAM: RIGHT SHOULDER - 2+ VIEW COMPARISON:  Chest radiograph dated 02/13/2020. FINDINGS: There is no acute fracture or dislocation. Mild osteopenia and mild arthritic changes of the right shoulder. There is degenerative changes and widening of the right [facility] joint with spurring. The soft tissues are unremarkable. Cervical spine ACDF. IMPRESSION: 1. No acute fracture or dislocation. 2. Mild arthritic changes of the right shoulder. Electronically Signed   By: Anner Crete M.D.   On: 08/04/2020 19:48    Assessment and Plan :   I have reviewed the PDMP during this encounter.  1. Shoulder strain, right, initial encounter   2. Acute pain of right shoulder   3. History of shoulder surgery     We will manage conservatively for right  shoulder strain.  Schedule Tylenol, use tramadol for breakthrough pain.  Follow-up with [facility] for recheck on her shoulder. Counseled patient on potential for adverse effects with medications prescribed/recommended today, ER and return-to-clinic precautions discussed, patient verbalized understanding.    Jaynee Eagles, PA-C 08/04/20 2002

## 2020-08-10 ENCOUNTER — Encounter: Payer: Self-pay | Admitting: Internal Medicine

## 2020-08-10 ENCOUNTER — Other Ambulatory Visit: Payer: Self-pay

## 2020-08-10 ENCOUNTER — Ambulatory Visit: Payer: 59 | Admitting: Internal Medicine

## 2020-08-10 ENCOUNTER — Other Ambulatory Visit: Payer: Self-pay | Admitting: Internal Medicine

## 2020-08-10 VITALS — BP 152/98 | HR 82 | Ht 67.0 in | Wt 280.2 lb

## 2020-08-10 DIAGNOSIS — E785 Hyperlipidemia, unspecified: Secondary | ICD-10-CM | POA: Diagnosis not present

## 2020-08-10 DIAGNOSIS — E1165 Type 2 diabetes mellitus with hyperglycemia: Secondary | ICD-10-CM | POA: Diagnosis not present

## 2020-08-10 LAB — POCT GLYCOSYLATED HEMOGLOBIN (HGB A1C): Hemoglobin A1C: 11.6 % — AB (ref 4.0–5.6)

## 2020-08-10 LAB — POCT GLUCOSE (DEVICE FOR HOME USE): POC Glucose: 361 mg/dl — AB (ref 70–99)

## 2020-08-10 MED ORDER — INSULIN PEN NEEDLE 31G X 5 MM MISC: 1.0000 | 2 refills | Status: DC

## 2020-08-10 MED ORDER — DAPAGLIFLOZIN PROPANEDIOL 5 MG PO TABS: 5.0000 mg | ORAL_TABLET | Freq: Every day | ORAL | 6 refills | Status: DC

## 2020-08-10 MED ORDER — TRESIBA FLEXTOUCH 100 UNIT/ML ~~LOC~~ SOPN: 30.0000 [IU] | PEN_INJECTOR | Freq: Every day | SUBCUTANEOUS | 2 refills | Status: DC

## 2020-08-10 MED ORDER — METFORMIN HCL ER 500 MG PO TB24: 500.0000 mg | ORAL_TABLET | Freq: Every day | ORAL | 1 refills | Status: DC

## 2020-08-10 MED FILL — FARXIGA 5 MG TABLET: 5 | 30 days supply | Qty: 30 | Fill #0

## 2020-08-10 MED FILL — metFORMIN HCL ER 500 MG TB2: 500 | 90 days supply | Qty: 90 | Fill #0

## 2020-08-10 NOTE — Progress Notes (Signed)
Name: [name]  MRN/ DOB: [id number], 1966-01-12   Age/ Sex: 55 y.o., female    PCP: Wanda Plump, MD   Reason for Endocrinology Evaluation: Type 2 Diabetes Mellitus     Date of Initial Endocrinology Visit: 08/10/2020     PATIENT IDENTIFIER: [name] is a 55 y.o. female with a past medical history of T2Dm, sickle cell trait, Asthma and Dyslipidemia The patient presented for initial endocrinology clinic visit on 08/10/2020 for consultative assistance with her diabetes management.    HPI: [name] was    Diagnosed with DM in 2014 Prior Medications tried/Intolerance:  Currently checking blood sugars 1x / day Hypoglycemia episodes : Yes           Symptoms: yes                 Frequency:rare  Hemoglobin A1c has ranged from 6.4% in 2016, peaking at > 14.0 %in 2021 Patient required assistance for hypoglycemia: no Patient has required hospitalization within the last 1 year from hyper or hypoglycemia: no  In terms of diet, the patient eats 3 meals a day, snacks 2-3x a day.  Drinks diet sodas.      HOME DIABETES REGIMEN: Glimepiride 4 mg daily  Tresiba 25 -30 units daily - takes  It only when over 200  Metformin 1000 mg BID- takes it once daily due to diarrhea     Statin: yes ACE-I/ARB: yes Prior Diabetic Education: no   METER DOWNLOAD SUMMARY: Did not bring      DIABETIC COMPLICATIONS: Microvascular complications:   Denies: CKD, retinopathy, neuropathy  Last eye exam: Completed 08/2019  Macrovascular complications:   Denies: CAD, PVD, CVA   PAST HISTORY: Past Medical History:  Past Medical History:  Diagnosis Date  . Anxiety state, unspecified 05/16/2013  . Arthritis    "right knee" (08/27/2013)  . Asthma    "only when I get a sinus infection which is 1-2 X/yr" (08/27/2013)  . Chronic pain syndrome   . Diabetes (HCC)    "borderline" (08/27/2013)  . Failed back syndrome, cervical   . GERD (gastroesophageal reflux disease)   . Hypertension    no  meds  . Lumbar radiculopathy, chronic   . Migraine    "once or twice/month now" (08/27/2013)  . Neck pain    axial, s/p cervical fusion C5-6, C6-7   . Obesity   . Sickle cell trait (HCC)   . Wears glasses    Past Surgical History:  Past Surgical History:  Procedure Laterality Date  . ANTERIOR CERVICAL DECOMP/DISCECTOMY FUSION N/A 08/27/2013   Procedure: ANTERIOR CERVICAL /DISCECTOMY FUSION (ACDF) C5-C7  2 LEVELS;  Surgeon: Venita Lick, MD;  Location: [facility];  Service: Orthopedics;  Laterality: N/A;  . BREAST REDUCTION SURGERY Bilateral 09/28/2014   Procedure: MAMMARY REDUCTION  (BREAST) BILATERAL;  Surgeon: Louisa Second, MD;  Location: [facility];  Service: Plastics;  Laterality: Bilateral;  . CHOLECYSTECTOMY  06/1989  . DILATION AND CURETTAGE OF UTERUS    . HERNIA REPAIR    . KNEE ARTHROSCOPY Left 08/2003  . LAPAROSCOPIC BILATERAL SALPINGECTOMY Bilateral 01/22/2015   Procedure: LAPAROSCOPIC BILATERAL SALPINGECTOMY;  Surgeon: Shea Evans, MD;  Location: [facility];  Service: Gynecology;  Laterality: Bilateral;  . LEFT HEART CATHETERIZATION WITH CORONARY ANGIOGRAM N/A 04/15/2013   Procedure: LEFT HEART CATHETERIZATION WITH CORONARY ANGIOGRAM;  Surgeon: Peter M Swaziland, MD;  Location: [facility] C[facility] LAB;  Service: Cardiovascular;  Laterality: N/A;  . LIPOSUCTION Bilateral 09/28/2014  Procedure: LIPOSUCTION;  Surgeon: Louisa SecondGerald Truesdale, MD;  Location: [facility];  Service: Plastics;  Laterality: Bilateral;  . ROBOTIC ASSISTED TOTAL HYSTERECTOMY N/A 01/22/2015   has her ovaries---ROBOTIC ASSISTED TOTAL HYSTERECTOMY With Pelvic Washings, Lysis of Adhesions;  Surgeon: Shea EvansVaishali Mody, MD;  Location: [facility];  Service: Gynecology;  Laterality: N/A;  . SHOULDER ARTHROSCOPY W/ ROTATOR CUFF REPAIR Right 2006, 2007  . TUBAL LIGATION  1991  . UMBILICAL HERNIA REPAIR  2001, 2003  . WISDOM TOOTH EXTRACTION  1985    Social History:  reports that she has never smoked. She has never  used smokeless tobacco. She reports that she does not drink alcohol and does not use drugs. Family History:  Family History  Problem Relation Age of Onset  . Breast cancer Other         GM  . Diabetes Father        F, B, S  . CAD Father        died of MI @ 2160  . Kidney disease Father   . Pancreatic cancer Other        GM  . CAD Sister        MI at age 54  . CAD Brother        CHF, died at age 54  . Heart attack Brother        died suddenly @ 152  . Leukemia Sister   . Colon cancer Neg Hx      HOME MEDICATIONS: Allergies as of 08/10/2020      Reactions   Codeine    Facial rash , lip swelling    Vicodin [hydrocodone-acetaminophen] Swelling, Rash   Pt is able to take percocet without problems      Medication List       Accurate as of August 10, 2020  2:31 PM. If you have any questions, ask your nurse or doctor.        albuterol 108 (90 Base) MCG/ACT inhaler Commonly known as: Ventolin HFA Inhale 2 puffs into the lungs every 6 (six) hours as needed for wheezing or shortness of breath.   aspirin EC 81 MG tablet Take 81 mg by mouth daily.   atorvastatin 40 MG tablet Commonly known as: LIPITOR Take 1 tablet (40 mg total) by mouth at bedtime.   baclofen 10 MG tablet Commonly known as: LIORESAL Take 1 tablet as needed at onset of migraine.   folic acid 1 MG tablet Commonly known as: FOLVITE Take 1 mg by mouth daily.   gabapentin 300 MG capsule Commonly known as: NEURONTIN Take 3 caps at bedtime What changed:  how much to take how to take this when to take this additional instructions   glimepiride 4 MG tablet Commonly known as: AMARYL Take 1 tablet (4 mg total) by mouth daily with breakfast.   Insulin Pen Needle 31G X 8 MM Misc Use w/ Tresiba   losartan 50 MG tablet Commonly known as: COZAAR Take 1 tablet (50 mg total) by mouth daily.   metFORMIN 1000 MG tablet Commonly known as: GLUCOPHAGE Take 1 tablet (1,000 mg total) by mouth 2 (two) times  daily with a meal.   Mucinex Nasal Spray Full Force 0.05 % nasal spray Generic drug: oxymetazoline Place 1 spray into both nostrils 2 (two) times daily as needed for congestion.   pantoprazole 20 MG tablet Commonly known as: PROTONIX Take 1 tablet (20 mg total) by mouth daily.   SALINE NASAL MIST NA Place 1  spray into the nose as needed (congestion).   topiramate 50 MG tablet Commonly known as: TOPAMAX Take 1 tablet in AM, 2 tablets in PM What changed:  how much to take how to take this when to take this additional instructions   traMADol 50 MG tablet Commonly known as: ULTRAM Take 1 tablet (50 mg total) by mouth every 6 (six) hours as needed.   Tyler Aas FlexTouch 100 UNIT/ML FlexTouch Pen Generic drug: insulin degludec Inject 25 Units into the skin daily.   vitamin B-12 500 MCG tablet Commonly known as: CYANOCOBALAMIN Take 1,000 mcg by mouth daily.   vitamin C 100 MG tablet Take 100 mg by mouth 2 (two) times daily.        ALLERGIES: Allergies  Allergen Reactions  . Codeine     Facial rash , lip swelling   . Vicodin [Hydrocodone-Acetaminophen] Swelling and Rash    Pt is able to take percocet without problems     REVIEW OF SYSTEMS: A comprehensive ROS was conducted with the patient and is negative except as per HPI and below:  Review of Systems  Gastrointestinal: Positive for diarrhea and nausea.  Neurological: Negative for tingling.  Endo/Heme/Allergies: Positive for polydipsia.      OBJECTIVE:   VITAL SIGNS: BP (!) 152/98   Pulse 82   Ht 5\' 7"  (1.702 m)   Wt 280 lb 4 oz (127.1 kg)   LMP 01/16/2015   SpO2 100%   BMI 43.89 kg/m    PHYSICAL EXAM:  General: Pt appears well and is in NAD  Neck: General: Supple without adenopathy or carotid bruits. Thyroid: Thyroid size normal.  No goiter or nodules appreciated. No thyroid bruit.  Lungs: Clear with good BS bilat with no rales, rhonchi, or wheezes  Heart: RRR with normal S1 and S2 and no gallops;  no murmurs; no rub  Abdomen: Normoactive bowel sounds, soft, nontender, without masses or organomegaly palpable  Extremities:  Lower extremities - No pretibial edema. No lesions.  Skin: Normal texture and temperature to palpation.  Neuro: MS is good with appropriate affect, pt is alert and Ox3    DM foot exam: 08/10/2020  The skin of the feet is intact without sores or ulcerations. The pedal pulses are 2+ on right and 2+ on left. The sensation is intact to a screening 5.07, 10 gram monofilament bilaterally   DATA REVIEWED:  Lab Results  Component Value Date   HGBA1C 11.6 (A) 08/10/2020   HGBA1C 10.2 (H) 05/28/2020   HGBA1C >14.0 (H) 01/29/2020   Lab Results  Component Value Date   MICROALBUR 11.2 (H) 11/16/2016   LDLCALC 169 (H) 05/28/2020   CREATININE 0.78 06/11/2020   Lab Results  Component Value Date   MICRALBCREAT 13.2 11/16/2016    Lab Results  Component Value Date   CHOL 262 (H) 05/28/2020   HDL 70 05/28/2020   LDLCALC 169 (H) 05/28/2020   TRIG 105 05/28/2020   CHOLHDL 3.7 05/28/2020        ASSESSMENT / PLAN / RECOMMENDATIONS:   1) Type 2 Diabetes Mellitus, Poorly controlled, Without complications - Most recent A1c of 11.6 %. Goal A1c < 7.0%.    Plan: GENERAL: I have discussed with the patient the pathophysiology of diabetes. We went over the natural progression of the disease. We talked about both insulin resistance and insulin deficiency. We stressed the importance of lifestyle changes including diet and exercise. I explained the complications associated with diabetes including retinopathy, nephropathy, neuropathy as well as increased  risk of cardiovascular disease. We went over the benefit seen with glycemic control.   I explained to the patient that diabetic patients are at higher than normal risk for amputations.  She declined adding prandial insulin, this would cause hardship  Will stop Glimepiride as she endorsed previous hypoglycemia with metformin, SU  and insulin combination, now she only uses insulin when BG is > 200 mg/dL due to fear of hypoglycemia  Will stop Metformin 1000 mg  due to GI side effects , switch to 500 mg XR  Will start SGLT-2 inhibitors, cautioned against genital infections    MEDICATIONS: - STOP Glimepiride  - STOP Metformin  - Start Metformin 500 mg XR 1 tablet with Breakfast  - Start Farxiga 5 mg, 1 tablet with Breakfast  - INcrease Tresiba to 30 units daily    EDUCATION / INSTRUCTIONS: BG monitoring instructions: Patient is instructed to check her blood sugars 2 times a day, fasting and bedtime  Call [facility] if: BG persistently < 70  I reviewed the Rule of 15 for the treatment of hypoglycemia in detail with the patient. Literature supplied.   2) Diabetic complications:  Eye: Does no have known diabetic retinopathy.  Neuro/ Feet: Does not have known diabetic peripheral neuropathy. Renal: Patient does not have known baseline CKD. She is on an ACEI/ARB at present.  3) Lipids: Patient is on Atorvastatin 40 mg daily . LDL above goal. Discussed cardiovascular benefits of statins and low fat diet . If this remains elevated, will consider Zetia      F/U in 3 months     Signed electronically by: [name] Guise, MD  [facility]  [address] [place], [place] [address] Phone: [phone] FAX: [phone]   CC: [address], [address] [place] [address] Phone: [phone]  Fax: [phone]    Return to Endocrinology clinic as below: Future Appointments  Date Time Provider [place]  09/03/2020  8:40 AM Colon Branch, MD [facility] [place]

## 2020-08-10 NOTE — Patient Instructions (Addendum)
-   STOP Glimepiride  - STOP Metformin  - Start Metformin 500 mg XR 1 tablet with Breakfast  - Start Farxiga 5 mg, 1 tablet with Breakfast  - INcrease Tresiba to 30 units daily      HOW TO TREAT LOW BLOOD SUGARS (Blood sugar LESS THAN 70 MG/DL)  Please follow the RULE OF 15 for the treatment of hypoglycemia treatment (when your (blood sugars are less than 70 mg/dL)    STEP 1: Take 15 grams of carbohydrates when your blood sugar is low, which includes:   3-4 GLUCOSE TABS  OR  3-4 OZ OF JUICE OR REGULAR SODA OR  ONE TUBE OF GLUCOSE GEL     STEP 2: RECHECK blood sugar in 15 MINUTES STEP 3: If your blood sugar is still low at the 15 minute recheck --> then, go back to STEP 1 and treat AGAIN with another 15 grams of carbohydrates.

## 2020-09-03 ENCOUNTER — Ambulatory Visit: Payer: 59 | Admitting: Internal Medicine

## 2020-09-08 ENCOUNTER — Other Ambulatory Visit: Payer: Self-pay

## 2020-09-08 ENCOUNTER — Other Ambulatory Visit: Payer: Self-pay | Admitting: Internal Medicine

## 2020-09-08 ENCOUNTER — Ambulatory Visit (INDEPENDENT_AMBULATORY_CARE_PROVIDER_SITE_OTHER): Payer: 59 | Admitting: Internal Medicine

## 2020-09-08 VITALS — BP 150/90 | HR 79 | Temp 97.9°F | Ht 67.0 in | Wt 275.8 lb

## 2020-09-08 DIAGNOSIS — E1165 Type 2 diabetes mellitus with hyperglycemia: Secondary | ICD-10-CM | POA: Diagnosis not present

## 2020-09-08 DIAGNOSIS — I10 Essential (primary) hypertension: Secondary | ICD-10-CM

## 2020-09-08 DIAGNOSIS — E785 Hyperlipidemia, unspecified: Secondary | ICD-10-CM | POA: Diagnosis not present

## 2020-09-08 MED ORDER — LOSARTAN POTASSIUM 100 MG PO TABS
100.0000 mg | ORAL_TABLET | Freq: Every day | ORAL | 1 refills | Status: DC
Start: 2020-09-08 — End: 2020-09-08

## 2020-09-08 MED FILL — LOSARTAN POTASSIUM 100 MG T: 100 | 90 days supply | Qty: 90 | Fill #0

## 2020-09-08 NOTE — Progress Notes (Signed)
Subjective:    Patient ID: [name], female    DOB: 01-11-1966, 55 y.o.   MRN: [id number]  DOS:  09/08/2020 Type of visit - description: Follow-up Today we talked about diabetes, hypertension, high cholesterol. Good compliance with atorvastatin Ambulatory BPs elevated, she said related to stress at work. DM, started [place], had 2 yeast infections since then, treated successfully with OTCs. Diet has improved, eating less carbohydrates, has lost some weight  Review of Systems See above   Past Medical History:  Diagnosis Date  . Anxiety state, unspecified 05/16/2013  . Arthritis    "right knee" (08/27/2013)  . Asthma    "only when I get a sinus infection which is 1-2 X/yr" (08/27/2013)  . Chronic pain syndrome   . Diabetes ([place])    "borderline" (08/27/2013)  . Failed back syndrome, cervical   . GERD (gastroesophageal reflux disease)   . Hypertension    no meds  . Lumbar radiculopathy, chronic   . Migraine    "once or twice/month now" (08/27/2013)  . Neck pain    axial, s/p cervical fusion C5-6, C6-7   . Obesity   . Sickle cell trait ([place])   . Wears glasses     Past Surgical History:  Procedure Laterality Date  . ANTERIOR CERVICAL DECOMP/DISCECTOMY FUSION N/A 08/27/2013   Procedure: ANTERIOR CERVICAL /DISCECTOMY FUSION (ACDF) C5-C7  2 LEVELS;  Surgeon: Melina Schools, MD;  Location: [place];  Service: Orthopedics;  Laterality: N/A;  . BREAST REDUCTION SURGERY Bilateral 09/28/2014   Procedure: MAMMARY REDUCTION  (BREAST) BILATERAL;  Surgeon: Cristine Polio, MD;  Location: [facility];  Service: Plastics;  Laterality: Bilateral;  . CHOLECYSTECTOMY  06/1989  . DILATION AND CURETTAGE OF UTERUS    . HERNIA REPAIR    . KNEE ARTHROSCOPY Left 08/2003  . LAPAROSCOPIC BILATERAL SALPINGECTOMY Bilateral 01/22/2015   Procedure: LAPAROSCOPIC BILATERAL SALPINGECTOMY;  Surgeon: Azucena Fallen, MD;  Location: [facility];  Service: Gynecology;  Laterality: Bilateral;  . LEFT HEART  CATHETERIZATION WITH CORONARY ANGIOGRAM N/A 04/15/2013   Procedure: LEFT HEART CATHETERIZATION WITH CORONARY ANGIOGRAM;  Surgeon: Peter M Martinique, MD;  Location: [facility] CATH LAB;  Service: Cardiovascular;  Laterality: N/A;  . LIPOSUCTION Bilateral 09/28/2014   Procedure: LIPOSUCTION;  Surgeon: Cristine Polio, MD;  Location: [facility];  Service: Plastics;  Laterality: Bilateral;  . ROBOTIC ASSISTED TOTAL HYSTERECTOMY N/A 01/22/2015   has her ovaries---ROBOTIC ASSISTED TOTAL HYSTERECTOMY With Pelvic Washings, Lysis of Adhesions;  Surgeon: Azucena Fallen, MD;  Location: [facility];  Service: Gynecology;  Laterality: N/A;  . SHOULDER ARTHROSCOPY W/ ROTATOR CUFF REPAIR Right 2006, 2007  . TUBAL LIGATION  1991  . UMBILICAL HERNIA REPAIR  2001, 2003  . [place] EXTRACTION  1985    Allergies as of 09/08/2020      Reactions   Codeine    Facial rash , lip swelling    Vicodin [hydrocodone-acetaminophen] Swelling, Rash   Pt is able to take percocet without problems      Medication List       Accurate as of September 08, 2020 11:59 PM. If you have any questions, ask your nurse or doctor.        albuterol 108 (90 Base) MCG/ACT inhaler Commonly known as: Ventolin HFA Inhale 2 puffs into the lungs every 6 (six) hours as needed for wheezing or shortness of breath.   aspirin EC 81 MG tablet Take 81 mg by mouth daily.   atorvastatin 40 MG tablet Commonly known  as: LIPITOR Take 1 tablet (40 mg total) by mouth at bedtime.   baclofen 10 MG tablet Commonly known as: LIORESAL Take 1 tablet as needed at onset of migraine.   dapagliflozin propanediol 5 MG Tabs tablet Commonly known as: Farxiga Take 1 tablet (5 mg total) by mouth daily before breakfast.   folic acid 1 MG tablet Commonly known as: FOLVITE Take 1 mg by mouth daily.   gabapentin 300 MG capsule Commonly known as: NEURONTIN Take 3 caps at bedtime What changed:   how much to take  how to take this  when to take  this  additional instructions   Insulin Pen Needle 31G X 5 MM Misc 1 Device by Does not apply route as directed.   losartan 100 MG tablet Commonly known as: COZAAR Take 1 tablet (100 mg total) by mouth daily. What changed:   medication strength  how much to take Changed by: Kathlene November, MD   metFORMIN 500 MG 24 hr tablet Commonly known as: GLUCOPHAGE-XR Take 1 tablet (500 mg total) by mouth daily with breakfast.   Mucinex Nasal Spray Full Force 0.05 % nasal spray Generic drug: oxymetazoline Place 1 spray into both nostrils 2 (two) times daily as needed for congestion.   pantoprazole 20 MG tablet Commonly known as: PROTONIX Take 1 tablet (20 mg total) by mouth daily.   SALINE NASAL MIST NA Place 1 spray into the nose as needed (congestion).   topiramate 50 MG tablet Commonly known as: TOPAMAX Take 1 tablet in AM, 2 tablets in PM What changed:   how much to take  how to take this  when to take this  additional instructions   traMADol 50 MG tablet Commonly known as: ULTRAM Take 1 tablet (50 mg total) by mouth every 6 (six) hours as needed.   Tyler Aas FlexTouch 100 UNIT/ML FlexTouch Pen Generic drug: insulin degludec Inject 30 Units into the skin daily.   vitamin B-12 500 MCG tablet Commonly known as: CYANOCOBALAMIN Take 1,000 mcg by mouth daily.   vitamin C 100 MG tablet Take 100 mg by mouth 2 (two) times daily.          Objective:   Physical Exam BP (!) 150/90 (BP Location: Left Arm, Patient Position: Sitting, Cuff Size: Large)   Pulse 79   Temp 97.9 F (36.6 C) (Oral)   Ht 5\' 7"  (1.702 m)   Wt 275 lb 12.8 oz (125.1 kg)   LMP 01/16/2015   SpO2 99%   BMI 43.20 kg/m  General:   Well developed, NAD, BMI noted. HEENT:  Normocephalic . Face symmetric, atraumatic Lungs:  CTA B Normal respiratory effort, no intercostal retractions, no accessory muscle use. Heart: RRR,  no murmur.  Lower extremities: no pretibial edema bilaterally  Skin: Not  pale. Not jaundice Neurologic:  alert & oriented X3.  Speech normal, gait appropriate for age and unassisted Psych--  Cognition and judgment appear intact.  Cooperative with normal attention span and concentration.  Behavior appropriate. No anxious or depressed appearing.      Assessment     ASSESSMENT HTN dx 04-2017 DM, no neuropathy Dyslipidemia GERD Asthma-- associated with URIs-allergies  Anxiety Morbid Obesity MSK: Dr Erlinda Hong, Dr Letta Pate --DJD. Multiple surgeries.  --Chronic back pain, local injections back (  sees  Dr. Ella Bodo) --Neck pain-- used to see  Dr Rolena Infante, Dr Nelva Bush now seen at [facility] Migraine headaches -- on topamax, baclofen, per Dr Delice Lesch Sickle cell trait CP: cardiac cath 2014 -- normal + FH  CAD  PLAN: DM: now per Endo, they added [place], had 2 yeast infections treated successfully with OTCs. Recommend no change, good hygiene after urination, call Endo if she continue with problems. HTN: Ambulatory BPs range from 802-233, diastolic BP in the 61Q. Will increase losartan to 100 mg, she is already following healthy, low-salt diet. Check a CMP in 2 weeks High cholesterol: Last LDL elevated, reports good compliance with atorvastatin, labs in 2 weeks RTC labs 2 weeks RTC follow-up 4 to 5 months     This visit occurred during the SARS-CoV-2 public health emergency.  Safety protocols were in place, including screening questions prior to the visit, additional usage of staff PPE, and extensive cleaning of exam room while observing appropriate contact time as indicated for disinfecting solutions.

## 2020-09-08 NOTE — Patient Instructions (Addendum)
Per our records you are due for an eye exam. Please contact your eye doctor to schedule an appointment. Please have them send copies of your office visit notes to [place]. Our fax number is [phone].   Increase losartan to 100 mg every day Check the  blood pressure 2 or 3 times a week BP GOAL is between 110/65 and  135/85. If it is consistently higher or lower, let me know     GO TO THE FRONT DESK, [place] back for blood work, fasting in 2 weeks     Come back for  A check up with me in 4-5 months

## 2020-09-09 NOTE — Assessment & Plan Note (Signed)
DM: now per Endo, they added [place], had 2 yeast infections treated successfully with OTCs. Recommend no change, good hygiene after urination, call Endo if she continue with problems. HTN: Ambulatory BPs range from 193-790, diastolic BP in the 24O. Will increase losartan to 100 mg, she is already following healthy, low-salt diet. Check a CMP in 2 weeks High cholesterol: Last LDL elevated, reports good compliance with atorvastatin, labs in 2 weeks RTC labs 2 weeks RTC follow-up 4 to 5 months

## 2020-09-22 ENCOUNTER — Other Ambulatory Visit: Payer: Self-pay

## 2020-09-22 ENCOUNTER — Other Ambulatory Visit (INDEPENDENT_AMBULATORY_CARE_PROVIDER_SITE_OTHER): Payer: 59

## 2020-09-22 DIAGNOSIS — E1165 Type 2 diabetes mellitus with hyperglycemia: Secondary | ICD-10-CM

## 2020-09-22 DIAGNOSIS — E785 Hyperlipidemia, unspecified: Secondary | ICD-10-CM

## 2020-09-22 LAB — LIPID PANEL
Cholesterol: 191 mg/dL (ref 0–200)
HDL: 53.4 mg/dL (ref >39.00)
LDL Cholesterol: 115 mg/dL — ABNORMAL HIGH (ref 0–99)
NonHDL: 137.12
Total CHOL/HDL Ratio: 4
Triglycerides: 109 mg/dL (ref 0.0–149.0)
VLDL: 21.8 mg/dL (ref 0.0–40.0)

## 2020-09-22 LAB — COMPREHENSIVE METABOLIC PANEL
ALT: 7 U/L (ref 0–35)
AST: 10 U/L (ref 0–37)
Albumin: 3.7 g/dL (ref 3.5–5.2)
Alkaline Phosphatase: 66 U/L (ref 39–117)
BUN: 21 mg/dL (ref 6–23)
CO2: 27 mEq/L (ref 19–32)
Calcium: 9.7 mg/dL (ref 8.4–10.5)
Chloride: 105 mEq/L (ref 96–112)
Creatinine, Ser: 0.89 mg/dL (ref 0.40–1.20)
GFR: 73.68 mL/min (ref >60.00)
Glucose, Bld: 169 mg/dL — ABNORMAL HIGH (ref 70–99)
Potassium: 4.1 mEq/L (ref 3.5–5.1)
Sodium: 139 mEq/L (ref 135–145)
Total Bilirubin: 0.5 mg/dL (ref 0.2–1.2)
Total Protein: 7.2 g/dL (ref 6.0–8.3)

## 2020-09-24 ENCOUNTER — Other Ambulatory Visit: Payer: Self-pay

## 2020-09-24 MED ORDER — ATORVASTATIN CALCIUM 80 MG PO TABS
80.0000 mg | ORAL_TABLET | Freq: Every evening | ORAL | 1 refills | Status: DC
Start: 2020-09-24 — End: 2020-09-24

## 2020-09-27 MED FILL — FARXIGA 5 MG TABLET: 5 | 30 days supply | Qty: 30 | Fill #1

## 2020-10-20 ENCOUNTER — Encounter: Payer: Self-pay | Admitting: Internal Medicine

## 2020-11-04 ENCOUNTER — Other Ambulatory Visit (HOSPITAL_BASED_OUTPATIENT_CLINIC_OR_DEPARTMENT_OTHER): Payer: Self-pay

## 2020-11-04 MED FILL — Metformin HCl Tab ER 24HR 500 MG: ORAL | 90 days supply | Qty: 90 | Fill #0 | Status: AC

## 2020-11-04 MED FILL — Dapagliflozin Propanediol Tab 5 MG (Base Equivalent): ORAL | 30 days supply | Qty: 30 | Fill #0 | Status: AC

## 2020-11-09 ENCOUNTER — Ambulatory Visit: Payer: 59 | Admitting: Internal Medicine

## 2020-11-09 NOTE — Progress Notes (Deleted)
Name: [name]  Age/ Sex: 55 y.o., female   MRN/ DOB: [id number], 23-Oct-1965     PCP: Colon Branch, MD   Reason for Endocrinology Evaluation: Type 2 Diabetes Mellitus  Initial Endocrine Consultative Visit: 08/10/2020    PATIENT IDENTIFIER: [name] is a 55 y.o. female with a past medical history of T2Dm, sickle cell trait, Asthma and Dyslipidemia . The patient has followed with Endocrinology clinic since 08/10/2020 for consultative assistance with management of her diabetes.  DIABETIC HISTORY:  [name] was diagnosed with DM in 2014.  Her hemoglobin A1c has ranged from 6.4% in 2016, peaking at > 14.0 %in 2021   On her initial visit to our clinic she had an A1c of 11.6%, she was on Tresiba, Metformin and Glimepiride . She declined prandial insulin . We stopped Glimepiride , we switched  Metformin to XR  due to GI side effects , we started [place] and increased insulin .    SUBJECTIVE:   During the last visit (08/10/2020): A1c 11.6% Stopped Glimepiride, increased insulin, and started [place]. She declined prandial insulin   Today (11/09/2020): [name] is here for a follow up on diabetes management.  She checks her blood sugars *** times daily, preprandial to breakfast and ***. The patient has *** had hypoglycemic episodes since the last clinic visit, which typically occur *** x / - most often occuring ***. The patient is *** symptomatic with the  HOME DIABETES REGIMEN:  Metformin 500 mg XR 1 tablet with Breakfast  Farxiga 5 mg, 1 tablet with Breakfast  Tresiba  30 units daily    Statin: yes ACE-I/ARB: yes Prior Diabetic Education: no   METER DOWNLOAD SUMMARY: Date range evaluated: *** Fingerstick Blood Glucose Tests = *** Average Number Tests/Day = *** Overall Mean FS Glucose = *** Standard Deviation = ***  BG Ranges: Low = *** High = ***   Hypoglycemic Events/30 Days: BG < 50 = *** Episodes of symptomatic severe hypoglycemia = ***    DIABETIC  COMPLICATIONS: Microvascular complications:    Denies: CKD, neuropathy, retinopathy  Last Eye Exam: Completed 2021  Macrovascular complications:    Denies: CAD, CVA, PVD   HISTORY:  Past Medical History:  Past Medical History:  Diagnosis Date  . Anxiety state, unspecified 05/16/2013  . Arthritis    "right knee" (08/27/2013)  . Asthma    "only when I get a sinus infection which is 1-2 X/yr" (08/27/2013)  . Chronic pain syndrome   . Diabetes ([place])    "borderline" (08/27/2013)  . Failed back syndrome, cervical   . GERD (gastroesophageal reflux disease)   . Hypertension    no meds  . Lumbar radiculopathy, chronic   . Migraine    "once or twice/month now" (08/27/2013)  . Neck pain    axial, s/p cervical fusion C5-6, C6-7   . Obesity   . Sickle cell trait ([place])   . Wears glasses     Past Surgical History:  Past Surgical History:  Procedure Laterality Date  . ANTERIOR CERVICAL DECOMP/DISCECTOMY FUSION N/A 08/27/2013   Procedure: ANTERIOR CERVICAL /DISCECTOMY FUSION (ACDF) C5-C7  2 LEVELS;  Surgeon: Melina Schools, MD;  Location: [facility];  Service: Orthopedics;  Laterality: N/A;  . BREAST REDUCTION SURGERY Bilateral 09/28/2014   Procedure: MAMMARY REDUCTION  (BREAST) BILATERAL;  Surgeon: Cristine Polio, MD;  Location: [facility];  Service: Plastics;  Laterality: Bilateral;  . CHOLECYSTECTOMY  06/1989  . DILATION AND CURETTAGE OF UTERUS    .  HERNIA REPAIR    . KNEE ARTHROSCOPY Left 08/2003  . LAPAROSCOPIC BILATERAL SALPINGECTOMY Bilateral 01/22/2015   Procedure: LAPAROSCOPIC BILATERAL SALPINGECTOMY;  Surgeon: Azucena Fallen, MD;  Location: [facility];  Service: Gynecology;  Laterality: Bilateral;  . LEFT HEART CATHETERIZATION WITH CORONARY ANGIOGRAM N/A 04/15/2013   Procedure: LEFT HEART CATHETERIZATION WITH CORONARY ANGIOGRAM;  Surgeon: Peter M Martinique, MD;  Location[facility] C[facility] LAB;  Service: Cardiovascular;  Laterality: N/A;  . LIPOSUCTION Bilateral 09/28/2014   Procedure:  LIPOSUCTION;  Surgeon: Cristine Polio, MD;  Location: [place];  Service: Plastics;  Laterality: Bilateral;  . ROBOTIC ASSISTED TOTAL HYSTERECTOMY N/A 01/22/2015   has her ovaries---ROBOTIC ASSISTED TOTAL HYSTERECTOMY With Pelvic Washings, Lysis of Adhesions;  Surgeon: Azucena Fallen, MD;  Location: [facility];  Service: Gynecology;  Laterality: N/A;  . SHOULDER ARTHROSCOPY W/ ROTATOR CUFF REPAIR Right 2006, 2007  . TUBAL LIGATION  1991  . UMBILICAL HERNIA REPAIR  2001, 2003  . [place] EXTRACTION  1985     Social History:  reports that she has never smoked. She has never used smokeless tobacco. She reports that she does not drink alcohol and does not use drugs. Family History:  Family History  Problem Relation Age of Onset  . Breast cancer Other         GM  . Diabetes Father        F, B, S  . CAD Father        died of MI @ 64  . Kidney disease Father   . Pancreatic cancer Other        GM  . CAD Sister        MI at age 31  . CAD Brother        CHF, died at age 10  . Heart attack Brother        died suddenly @ 51  . Leukemia Sister   . Colon cancer Neg Hx       HOME MEDICATIONS: Allergies as of 11/09/2020      Reactions   Codeine    Facial rash , lip swelling    Vicodin [hydrocodone-acetaminophen] Swelling, Rash   Pt is able to take percocet without problems      Medication List       Accurate as of Nov 09, 2020  7:08 AM. If you have any questions, ask your nurse or doctor.        albuterol 108 (90 Base) MCG/ACT inhaler Commonly known as: Ventolin HFA Inhale 2 puffs into the lungs every 6 (six) hours as needed for wheezing or shortness of breath.   aspirin EC 81 MG tablet Take 81 mg by mouth daily.   atorvastatin 80 MG tablet Commonly known as: LIPITOR TAKE 1 TABLET BY MOUTH EVERY NIGHT AT BEDTIME   B-D UF III MINI PEN NEEDLES 31G X 5 MM Misc Generic drug: Insulin Pen Needle USE AS DIRECTED   baclofen 10 MG tablet Commonly known as:  LIORESAL Take 1 tablet as needed at onset of migraine.   Farxiga 5 MG Tabs tablet Generic drug: dapagliflozin propanediol TAKE 1 TABLET BY MOUTH DAILY BEFORE BEFORE BREAKFAST   folic acid 1 MG tablet Commonly known as: FOLVITE Take 1 mg by mouth daily.   gabapentin 300 MG capsule Commonly known as: NEURONTIN Take 3 caps at bedtime What changed:   how much to take  how to take this  when to take this  additional instructions   losartan 100 MG tablet  Commonly known as: COZAAR TAKE 1 TABLET (100 MG TOTAL) BY MOUTH DAILY.   metFORMIN 500 MG 24 hr tablet Commonly known as: GLUCOPHAGE-XR TAKE 1 TABLET BY MOUTH DAILY WITH BREAKFAST   Mucinex Nasal Spray Full Force 0.05 % nasal spray Generic drug: oxymetazoline Place 1 spray into both nostrils 2 (two) times daily as needed for congestion.   pantoprazole 20 MG tablet Commonly known as: PROTONIX Take 1 tablet (20 mg total) by mouth daily.   SALINE NASAL MIST NA Place 1 spray into the nose as needed (congestion).   topiramate 50 MG tablet Commonly known as: TOPAMAX Take 1 tablet in AM, 2 tablets in PM What changed:   how much to take  how to take this  when to take this  additional instructions   traMADol 50 MG tablet Commonly known as: ULTRAM Take 1 tablet (50 mg total) by mouth every 6 (six) hours as needed.   Tyler Aas FlexTouch 100 UNIT/ML FlexTouch Pen Generic drug: insulin degludec INJECT 30 UNITS INTO THE SKIN DAILY   vitamin B-12 500 MCG tablet Commonly known as: CYANOCOBALAMIN Take 1,000 mcg by mouth daily.   vitamin C 100 MG tablet Take 100 mg by mouth 2 (two) times daily.        OBJECTIVE:   Vital Signs: LMP 01/16/2015   Wt Readings from Last 3 Encounters:  09/08/20 275 lb 12.8 oz (125.1 kg)  08/10/20 280 lb 4 oz (127.1 kg)  05/28/20 276 lb (125.2 kg)     Exam: General: Pt appears well and is in NAD  Lungs: Clear with good BS bilat with no rales, rhonchi, or wheezes  Heart: RRR  with normal S1 and S2 and no gallops; no murmurs; no rub  Abdomen: Normoactive bowel sounds, soft, nontender, without masses or organomegaly palpable  Extremities: No pretibial edema  Neuro: MS is good with appropriate affect, pt is alert and Ox3       DM foot exam: 08/10/2020  The skin of the feet is intact without sores or ulcerations. The pedal pulses are 2+ on right and 2+ on left. The sensation is intact to a screening 5.07, 10 gram monofilament bilaterally    DATA REVIEWED:  Lab Results  Component Value Date   HGBA1C 11.6 (A) 08/10/2020   HGBA1C 10.2 (H) 05/28/2020   HGBA1C >14.0 (H) 01/29/2020   Lab Results  Component Value Date   MICROALBUR 11.2 (H) 11/16/2016   LDLCALC 115 (H) 09/22/2020   CREATININE 0.89 09/22/2020   Lab Results  Component Value Date   MICRALBCREAT 13.2 11/16/2016     Lab Results  Component Value Date   CHOL 191 09/22/2020   HDL 53.40 09/22/2020   LDLCALC 115 (H) 09/22/2020   TRIG 109.0 09/22/2020   CHOLHDL 4 09/22/2020         ASSESSMENT / PLAN / RECOMMENDATIONS:   1) Type 2 Diabetes Mellitus, ***controlled, With out complications - Most recent A1c of *** %. Goal A1c < 7.0 %.    Plan: MEDICATIONS:  ***  EDUCATION / INSTRUCTIONS:  BG monitoring instructions: Patient is instructed to check her blood sugars *** times a day, ***.  Call [facility] if: BG persistently < 70  . I reviewed the Rule of 15 for the treatment of hypoglycemia in detail with the patient. Literature supplied.    2) Diabetic complications:   Eye: Does not have known diabetic retinopathy.   Neuro/ Feet: Does not have known diabetic peripheral neuropathy .   Renal:  Patient does not have known baseline CKD. She   is  on an ACEI/ARB at present.   3) Dyslipidemia:   Patient is on Atorvastatin 40 mg daily . LDL trending down but still above goal of 115 mg/dL . If this remains elevated, will consider Zetia   F/U in ***    Signed  electronically by: [name] Guise, MD  [facility]  [address] [place], [place] [address] Phone: [phone] FAX: [phone]   CC: [facility] Branch, [address] [place] [address] Phone: [phone]  Fax: [phone]  Return to Endocrinology clinic as below: Future Appointments  Date Time Provider [place]  11/09/2020  8:10 AM Melanee Cordial, Melanie Crazier, MD [facility]  01/12/2021  8:20 AM Colon Branch, MD [facility]

## 2020-12-23 ENCOUNTER — Other Ambulatory Visit (HOSPITAL_BASED_OUTPATIENT_CLINIC_OR_DEPARTMENT_OTHER): Payer: Self-pay

## 2020-12-23 MED FILL — Atorvastatin Calcium Tab 80 MG (Base Equivalent): ORAL | 90 days supply | Qty: 90 | Fill #0 | Status: AC

## 2020-12-23 MED FILL — Dapagliflozin Propanediol Tab 5 MG (Base Equivalent): ORAL | 30 days supply | Qty: 30 | Fill #1 | Status: AC

## 2021-01-12 ENCOUNTER — Ambulatory Visit: Payer: 59 | Admitting: Internal Medicine

## 2021-02-01 ENCOUNTER — Other Ambulatory Visit: Payer: Self-pay

## 2021-02-01 ENCOUNTER — Encounter: Payer: Self-pay | Admitting: Internal Medicine

## 2021-02-01 ENCOUNTER — Other Ambulatory Visit (HOSPITAL_BASED_OUTPATIENT_CLINIC_OR_DEPARTMENT_OTHER): Payer: Self-pay

## 2021-02-01 ENCOUNTER — Ambulatory Visit (INDEPENDENT_AMBULATORY_CARE_PROVIDER_SITE_OTHER): Payer: 59 | Admitting: Internal Medicine

## 2021-02-01 VITALS — BP 138/96 | HR 80 | Temp 98.2°F | Resp 18 | Ht 67.0 in | Wt 268.5 lb

## 2021-02-01 DIAGNOSIS — I10 Essential (primary) hypertension: Secondary | ICD-10-CM | POA: Diagnosis not present

## 2021-02-01 DIAGNOSIS — Z1159 Encounter for screening for other viral diseases: Secondary | ICD-10-CM | POA: Diagnosis not present

## 2021-02-01 DIAGNOSIS — E785 Hyperlipidemia, unspecified: Secondary | ICD-10-CM | POA: Diagnosis not present

## 2021-02-01 DIAGNOSIS — G43109 Migraine with aura, not intractable, without status migrainosus: Secondary | ICD-10-CM

## 2021-02-01 MED ORDER — LOSARTAN POTASSIUM-HCTZ 100-25 MG PO TABS
1.0000 | ORAL_TABLET | Freq: Every day | ORAL | 0 refills | Status: DC
  Filled 2021-02-01: qty 30, 30d supply, fill #0

## 2021-02-01 NOTE — Progress Notes (Signed)
Subjective:    Patient ID: [name], female    DOB: 1965/09/09, 55 y.o.   MRN: [id number]  DOS:  02/01/2021 Type of visit - description: f/u Today with talk about hypertension, high cholesterol, migraines and vaccinations. In general she feels well. Ambulatory BPs are not at goal Weight loss continue, she has a lot of "skin hanging", wonders what to do about it .     Wt Readings from Last 3 Encounters:  02/01/21 268 lb 8 oz (121.8 kg)  09/08/20 275 lb 12.8 oz (125.1 kg)  08/10/20 280 lb 4 oz (127.1 kg)    Review of Systems See above   Past Medical History:  Diagnosis Date   Anxiety state, unspecified 05/16/2013   Arthritis    "right knee" (08/27/2013)   Asthma    "only when I get a sinus infection which is 1-2 X/yr" (08/27/2013)   Chronic pain syndrome    Diabetes ([place])    "borderline" (08/27/2013)   Failed back syndrome, cervical    GERD (gastroesophageal reflux disease)    Hypertension    no meds   Lumbar radiculopathy, chronic    Migraine    "once or twice/month now" (08/27/2013)   Neck pain    axial, s/p cervical fusion C5-6, C6-7    Obesity    Sickle cell trait ([place])    Wears glasses     Past Surgical History:  Procedure Laterality Date   ANTERIOR CERVICAL DECOMP/DISCECTOMY FUSION N/A 08/27/2013   Procedure: ANTERIOR CERVICAL /DISCECTOMY FUSION (ACDF) C5-C7  2 LEVELS;  Surgeon: Melina Schools, MD;  Location: [facility];  Service: Orthopedics;  Laterality: N/A;   BREAST REDUCTION SURGERY Bilateral 09/28/2014   Procedure: MAMMARY REDUCTION  (BREAST) BILATERAL;  Surgeon: Cristine Polio, MD;  Location: [facility];  Service: Plastics;  Laterality: Bilateral;   CHOLECYSTECTOMY  06/1989   DILATION AND CURETTAGE OF UTERUS     HERNIA REPAIR     KNEE ARTHROSCOPY Left 08/2003   LAPAROSCOPIC BILATERAL SALPINGECTOMY Bilateral 01/22/2015   Procedure: LAPAROSCOPIC BILATERAL SALPINGECTOMY;  Surgeon: Azucena Fallen, MD;  Location: [facility];  Service: Gynecology;   Laterality: Bilateral;   LEFT HEART CATHETERIZATION WITH CORONARY ANGIOGRAM N/A 04/15/2013   Procedure: LEFT HEART CATHETERIZATION WITH CORONARY ANGIOGRAM;  Surgeon: Peter M Martinique, MD;  Location: [facility] CATH LAB;  Service: Cardiovascular;  Laterality: N/A;   LIPOSUCTION Bilateral 09/28/2014   Procedure: LIPOSUCTION;  Surgeon: Cristine Polio, MD;  Location: [facility];  Service: Plastics;  Laterality: Bilateral;   ROBOTIC ASSISTED TOTAL HYSTERECTOMY N/A 01/22/2015   has her ovaries---ROBOTIC ASSISTED TOTAL HYSTERECTOMY With Pelvic Washings, Lysis of Adhesions;  Surgeon: Azucena Fallen, MD;  Location: [facility];  Service: Gynecology;  Laterality: N/A;   SHOULDER ARTHROSCOPY W/ ROTATOR CUFF REPAIR Right 2006, 2007   TUBAL LIGATION  99991111   UMBILICAL HERNIA REPAIR  2001, 2003   [place] EXTRACTION  1985    Allergies as of 02/01/2021       Reactions   Codeine    Facial rash , lip swelling    Vicodin [hydrocodone-acetaminophen] Swelling, Rash   Pt is able to take percocet without problems        Medication List        Accurate as of February 01, 2021 11:59 PM. If you have any questions, ask your nurse or doctor.          STOP taking these medications    losartan 100 MG tablet Commonly known as: COZAAR  Stopped by: Kathlene November, MD       TAKE these medications    albuterol 108 (90 Base) MCG/ACT inhaler Commonly known as: Ventolin HFA Inhale 2 puffs into the lungs every 6 (six) hours as needed for wheezing or shortness of breath.   aspirin EC 81 MG tablet Take 81 mg by mouth daily.   atorvastatin 80 MG tablet Commonly known as: LIPITOR TAKE 1 TABLET BY MOUTH EVERY NIGHT AT BEDTIME   B-D UF III MINI PEN NEEDLES 31G X 5 MM Misc Generic drug: Insulin Pen Needle USE AS DIRECTED   baclofen 10 MG tablet Commonly known as: LIORESAL Take 1 tablet as needed at onset of migraine.   Farxiga 5 MG Tabs tablet Generic drug: dapagliflozin propanediol TAKE 1 TABLET BY MOUTH  DAILY BEFORE BEFORE BREAKFAST   folic acid 1 MG tablet Commonly known as: FOLVITE Take 1 mg by mouth daily.   gabapentin 300 MG capsule Commonly known as: NEURONTIN Take 3 caps at bedtime What changed:  how much to take how to take this when to take this additional instructions   losartan-hydrochlorothiazide 100-25 MG tablet Commonly known as: HYZAAR Take 1 tablet by mouth daily. Started by: Kathlene November, MD   metFORMIN 500 MG 24 hr tablet Commonly known as: GLUCOPHAGE-XR TAKE 1 TABLET BY MOUTH DAILY WITH BREAKFAST   Mucinex Nasal Spray Full Force 0.05 % nasal spray Generic drug: oxymetazoline Place 1 spray into both nostrils 2 (two) times daily as needed for congestion.   pantoprazole 20 MG tablet Commonly known as: PROTONIX Take 1 tablet (20 mg total) by mouth daily. What changed:  when to take this reasons to take this   SALINE NASAL MIST NA Place 1 spray into the nose as needed (congestion).   topiramate 50 MG tablet Commonly known as: TOPAMAX Take 1 tablet in AM, 2 tablets in PM What changed:  how much to take how to take this when to take this additional instructions   traMADol 50 MG tablet Commonly known as: ULTRAM Take 1 tablet (50 mg total) by mouth every 6 (six) hours as needed.   Tyler Aas FlexTouch 100 UNIT/ML FlexTouch Pen Generic drug: insulin degludec INJECT 30 UNITS INTO THE SKIN DAILY   vitamin B-12 500 MCG tablet Commonly known as: CYANOCOBALAMIN Take 1,000 mcg by mouth daily.   vitamin C 100 MG tablet Take 100 mg by mouth 2 (two) times daily.           Objective:   Physical Exam BP (!) 138/96 (BP Location: Left Arm, Patient Position: Sitting, Cuff Size: Normal)   Pulse 80   Temp 98.2 F (36.8 C) (Oral)   Resp 18   Ht '5\' 7"'$  (1.702 m)   Wt 268 lb 8 oz (121.8 kg)   LMP 01/16/2015   SpO2 96%   BMI 42.05 kg/m  General:   Well developed, NAD, BMI noted. HEENT:  Normocephalic . Face symmetric, atraumatic Lungs:  CTA B Normal  respiratory effort, no intercostal retractions, no accessory muscle use. Heart: RRR,  no murmur.  Lower extremities: no pretibial edema bilaterally  Skin: Not pale. Not jaundice Neurologic:  alert & oriented X3.  Speech normal, gait appropriate for age and unassisted Psych--  Cognition and judgment appear intact.  Cooperative with normal attention span and concentration.  Behavior appropriate. No anxious or depressed appearing.      Assessment     ASSESSMENT HTN dx 04-2017 DM, no neuropathy Dyslipidemia GERD Asthma-- associated with URIs-allergies  Anxiety Morbid  Obesity MSK: Dr Erlinda Hong, Dr Letta Pate --DJD. Multiple surgeries.  --Chronic back pain, local injections back (  sees  Dr. Ella Bodo) --Neck pain-- used to see  Dr Rolena Infante, Dr Nelva Bush now seen at [facility] Migraine headaches -- on topamax, baclofen, per Dr Delice Lesch Sickle cell trait CP: cardiac cath 2014 -- normal + FH CAD   PLAN: DM: Per Endo HTN: At the last visit, losartan was increased, follow-up BMP was okay.  BP today the 138/96, at home is typically in the 140s/90s.  Not well controlled. States she is already following a low-salt diet Plan: Switch to losartan HCT, CMP in 2 weeks, monitor BPs, see goals (AVS) High cholesterol, last FLP 09-2020, atorvastatin increased to 80 mg.  Good compliance, no apparent side effects, check FLP Morbid obesity: Making progress, praised .  Complains of "skin hanging" since she lost weight, recommend muscle toning and exercises. Migraines: On Topamax, well controlled. Vaccine advise: Reluctant to proceed with COVID, benefits discussed. Preventive care: Hep C RTC 2 weeks labs RTC CPX 3 months    This visit occurred during the SARS-CoV-2 public health emergency.  Safety protocols were in place, including screening questions prior to the visit, additional usage of staff PPE, and extensive cleaning of exam room while observing appropriate contact time as indicated for disinfecting solutions.

## 2021-02-01 NOTE — Patient Instructions (Addendum)
Stop losartan  Start losartan HCT 100-25: 1 tablet every morning.  Check the  blood pressure 2 or 3 times a week BP GOAL is between 110/65 and  135/85. If it is consistently higher or lower, let me know    GO TO THE FRONT DESK, [place] back for   blood work, fasting in 2 weeks  Come back for a physical exam in 3 months

## 2021-02-02 NOTE — Assessment & Plan Note (Signed)
DM: Per Endo HTN: At the last visit, losartan was increased, follow-up BMP was okay.  BP today the 138/96, at home is typically in the 140s/90s.  Not well controlled. States she is already following a low-salt diet Plan: Switch to losartan HCT, CMP in 2 weeks, monitor BPs, see goals (AVS) High cholesterol, last FLP 09-2020, atorvastatin increased to 80 mg.  Good compliance, no apparent side effects, check FLP Morbid obesity: Making progress, praised .  Complains of "skin hanging" since she lost weight, recommend muscle toning and exercises. Migraines: On Topamax, well controlled. Vaccine advise: Reluctant to proceed with COVID, benefits discussed. Preventive care: Hep C RTC 2 weeks labs RTC CPX 3 months

## 2021-02-10 ENCOUNTER — Other Ambulatory Visit (HOSPITAL_BASED_OUTPATIENT_CLINIC_OR_DEPARTMENT_OTHER): Payer: Self-pay

## 2021-02-10 MED FILL — Dapagliflozin Propanediol Tab 5 MG (Base Equivalent): ORAL | 30 days supply | Qty: 30 | Fill #2 | Status: AC

## 2021-02-15 ENCOUNTER — Other Ambulatory Visit: Payer: Self-pay

## 2021-02-15 ENCOUNTER — Other Ambulatory Visit (INDEPENDENT_AMBULATORY_CARE_PROVIDER_SITE_OTHER): Payer: 59

## 2021-02-15 DIAGNOSIS — E785 Hyperlipidemia, unspecified: Secondary | ICD-10-CM

## 2021-02-15 DIAGNOSIS — I10 Essential (primary) hypertension: Secondary | ICD-10-CM | POA: Diagnosis not present

## 2021-02-15 DIAGNOSIS — Z1159 Encounter for screening for other viral diseases: Secondary | ICD-10-CM | POA: Diagnosis not present

## 2021-02-15 LAB — COMPREHENSIVE METABOLIC PANEL
ALT: 8 U/L (ref 0–35)
AST: 11 U/L (ref 0–37)
Albumin: 3.7 g/dL (ref 3.5–5.2)
Alkaline Phosphatase: 76 U/L (ref 39–117)
BUN: 22 mg/dL (ref 6–23)
CO2: 27 mEq/L (ref 19–32)
Calcium: 9.4 mg/dL (ref 8.4–10.5)
Chloride: 99 mEq/L (ref 96–112)
Creatinine, Ser: 0.86 mg/dL (ref 0.40–1.20)
GFR: 76.56 mL/min (ref >60.00)
Glucose, Bld: 214 mg/dL — ABNORMAL HIGH (ref 70–99)
Potassium: 3.8 mEq/L (ref 3.5–5.1)
Sodium: 135 mEq/L (ref 135–145)
Total Bilirubin: 0.4 mg/dL (ref 0.2–1.2)
Total Protein: 7 g/dL (ref 6.0–8.3)

## 2021-02-15 LAB — LIPID PANEL
Cholesterol: 244 mg/dL — ABNORMAL HIGH (ref 0–200)
HDL: 53.7 mg/dL (ref >39.00)
LDL Cholesterol: 160 mg/dL — ABNORMAL HIGH (ref 0–99)
NonHDL: 190.77
Total CHOL/HDL Ratio: 5
Triglycerides: 152 mg/dL — ABNORMAL HIGH (ref 0.0–149.0)
VLDL: 30.4 mg/dL (ref 0.0–40.0)

## 2021-02-16 LAB — HEPATITIS C ANTIBODY
Hepatitis C Ab: NONREACTIVE
SIGNAL TO CUT-OFF: 0.01 (ref <1.00)

## 2021-02-18 ENCOUNTER — Other Ambulatory Visit (HOSPITAL_BASED_OUTPATIENT_CLINIC_OR_DEPARTMENT_OTHER): Payer: Self-pay

## 2021-02-18 MED ORDER — EZETIMIBE 10 MG PO TABS
10.0000 mg | ORAL_TABLET | Freq: Every day | ORAL | 3 refills | Status: DC
  Filled 2021-02-18: qty 90, 90d supply, fill #0
  Filled 2021-03-11: qty 30, 30d supply, fill #0

## 2021-02-18 MED ORDER — LOSARTAN POTASSIUM-HCTZ 100-25 MG PO TABS
1.0000 | ORAL_TABLET | Freq: Every day | ORAL | 1 refills | Status: DC
  Filled 2021-02-18: qty 90, 90d supply, fill #0

## 2021-02-18 NOTE — Addendum Note (Signed)
Addended byDamita Dunnings D on: 02/18/2021 10:13 AM   Modules accepted: Orders

## 2021-03-01 ENCOUNTER — Other Ambulatory Visit (HOSPITAL_BASED_OUTPATIENT_CLINIC_OR_DEPARTMENT_OTHER): Payer: Self-pay

## 2021-03-10 ENCOUNTER — Other Ambulatory Visit (HOSPITAL_BASED_OUTPATIENT_CLINIC_OR_DEPARTMENT_OTHER): Payer: Self-pay

## 2021-03-10 ENCOUNTER — Other Ambulatory Visit: Payer: Self-pay | Admitting: Internal Medicine

## 2021-03-10 MED ORDER — METFORMIN HCL ER 500 MG PO TB24
ORAL_TABLET | Freq: Every day | ORAL | 0 refills | Status: DC
  Filled 2021-03-10: qty 30, 30d supply, fill #0

## 2021-03-10 MED FILL — Dapagliflozin Propanediol Tab 5 MG (Base Equivalent): ORAL | 30 days supply | Qty: 30 | Fill #3 | Status: AC

## 2021-03-11 ENCOUNTER — Other Ambulatory Visit (HOSPITAL_BASED_OUTPATIENT_CLINIC_OR_DEPARTMENT_OTHER): Payer: Self-pay

## 2021-03-16 ENCOUNTER — Encounter: Payer: Self-pay | Admitting: Internal Medicine

## 2021-05-02 ENCOUNTER — Encounter: Payer: 59 | Admitting: Internal Medicine

## 2021-05-26 ENCOUNTER — Other Ambulatory Visit (HOSPITAL_BASED_OUTPATIENT_CLINIC_OR_DEPARTMENT_OTHER): Payer: Self-pay

## 2021-05-26 MED FILL — Dapagliflozin Propanediol Tab 5 MG (Base Equivalent): ORAL | 30 days supply | Qty: 30 | Fill #4 | Status: AC

## 2021-06-01 ENCOUNTER — Encounter: Payer: Self-pay | Admitting: Internal Medicine

## 2021-06-01 ENCOUNTER — Other Ambulatory Visit: Payer: Self-pay

## 2021-06-01 ENCOUNTER — Ambulatory Visit (INDEPENDENT_AMBULATORY_CARE_PROVIDER_SITE_OTHER): Payer: 59 | Admitting: Internal Medicine

## 2021-06-01 VITALS — BP 136/82 | HR 80 | Temp 98.2°F | Resp 18 | Ht 67.0 in | Wt 269.1 lb

## 2021-06-01 DIAGNOSIS — Z0000 Encounter for general adult medical examination without abnormal findings: Secondary | ICD-10-CM | POA: Diagnosis not present

## 2021-06-01 DIAGNOSIS — Z1231 Encounter for screening mammogram for malignant neoplasm of breast: Secondary | ICD-10-CM

## 2021-06-01 DIAGNOSIS — E785 Hyperlipidemia, unspecified: Secondary | ICD-10-CM

## 2021-06-01 DIAGNOSIS — E1165 Type 2 diabetes mellitus with hyperglycemia: Secondary | ICD-10-CM

## 2021-06-01 DIAGNOSIS — I10 Essential (primary) hypertension: Secondary | ICD-10-CM | POA: Diagnosis not present

## 2021-06-01 NOTE — Patient Instructions (Addendum)
Per our records you are due for your diabetic eye exam. Please contact your eye doctor to schedule an appointment. Please have them send copies of your office visit notes to [place]. Our fax number is [phone]. If you need a referral to an eye doctor please let us know.  You are due for your yearly mammogram, please call [facility] to schedule that appointment.   Vaccines I recommend: Flu shot COVID-vaccine Shingrix  Check the  blood pressure 3-4 times a month  BP GOAL is between 110/65 and  135/85. If it is consistently higher or lower, let me know    GO TO THE LAB : Get the blood work      [place], PLEASE SCHEDULE YOUR APPOINTMENTS  Schedule a visit to see endocrinology  Dr. Kelton Pillar .  Schedule a visit to see me in 6 months

## 2021-06-01 NOTE — Progress Notes (Signed)
Subjective:    Patient ID: [name], female    DOB: 1965/10/12, 55 y.o.   MRN: [id number]  DOS:  06/01/2021 Type of visit - description: cpx  Since the last office visit, she is doing well.  Has no concerns.  Wt Readings from Last 3 Encounters:  06/01/21 269 lb 2 oz (122.1 kg)  02/01/21 268 lb 8 oz (121.8 kg)  09/08/20 275 lb 12.8 oz (125.1 kg)    Review of Systems   A 14 point review of systems is negative    Past Medical History:  Diagnosis Date   Anxiety state, unspecified 05/16/2013   Arthritis    "right knee" (08/27/2013)   Asthma    "only when I get a sinus infection which is 1-2 X/yr" (08/27/2013)   Chronic pain syndrome    Diabetes ([place])    "borderline" (08/27/2013)   Failed back syndrome, cervical    GERD (gastroesophageal reflux disease)    Hypertension    no meds   Lumbar radiculopathy, chronic    Migraine    "once or twice/month now" (08/27/2013)   Neck pain    axial, s/p cervical fusion C5-6, C6-7    Obesity    Sickle cell trait ([place])    Wears glasses     Past Surgical History:  Procedure Laterality Date   ANTERIOR CERVICAL DECOMP/DISCECTOMY FUSION N/A 08/27/2013   Procedure: ANTERIOR CERVICAL /DISCECTOMY FUSION (ACDF) C5-C7  2 LEVELS;  Surgeon: Melina Schools, MD;  Location: [place];  Service: Orthopedics;  Laterality: N/A;   BREAST REDUCTION SURGERY Bilateral 09/28/2014   Procedure: MAMMARY REDUCTION  (BREAST) BILATERAL;  Surgeon: Cristine Polio, MD;  Location: [place] [facility];  Service: Plastics;  Laterality: Bilateral;   CHOLECYSTECTOMY  06/1989   DILATION AND CURETTAGE OF UTERUS     HERNIA REPAIR     KNEE ARTHROSCOPY Left 08/2003   LAPAROSCOPIC BILATERAL SALPINGECTOMY Bilateral 01/22/2015   Procedure: LAPAROSCOPIC BILATERAL SALPINGECTOMY;  Surgeon: Azucena Fallen, MD;  Location: [facility];  Service: Gynecology;  Laterality: Bilateral;   LEFT HEART CATHETERIZATION WITH CORONARY ANGIOGRAM N/A 04/15/2013   Procedure: LEFT HEART  CATHETERIZATION WITH CORONARY ANGIOGRAM;  Surgeon: Peter M Martinique, MD;  Location: [facility] CATH LAB;  Service: Cardiovascular;  Laterality: N/A;   LIPOSUCTION Bilateral 09/28/2014   Procedure: LIPOSUCTION;  Surgeon: Cristine Polio, MD;  Location: [place];  Service: Plastics;  Laterality: Bilateral;   ROBOTIC ASSISTED TOTAL HYSTERECTOMY N/A 01/22/2015   has her ovaries---ROBOTIC ASSISTED TOTAL HYSTERECTOMY With Pelvic Washings, Lysis of Adhesions;  Surgeon: Azucena Fallen, MD;  Location: [facility];  Service: Gynecology;  Laterality: N/A;   SHOULDER ARTHROSCOPY W/ ROTATOR CUFF REPAIR Right 2006, 2007   TUBAL LIGATION  1517   UMBILICAL HERNIA REPAIR  2001, 2003   WISDOM TOOTH EXTRACTION  1985   Social History   Socioeconomic History   Marital status: Divorced    Spouse name: Not on file   Number of children: 2   Years of education: Not on file   Highest education level: Not on file  Occupational History   Occupation: works , Insurance underwriter: [employer]  Tobacco Use   Smoking status: Never   Smokeless tobacco: Never  Vaping Use   Vaping Use: Never used  Substance and Sexual Activity   Alcohol use: No    Alcohol/week: 0.0 standard drinks   Drug use: No   Sexual activity: Not Currently    Birth control/protection: None  Other Topics Concern  Not on file  Social History Narrative   Lives w/ her mother   married 05-2017, divorced 2021    2 adult children   8 her 19 year old daughter March 13, 2020.       Social Determinants of Health   Financial Resource Strain: Not on file  Food Insecurity: Not on file  Transportation Needs: Not on file  Physical Activity: Not on file  Stress: Not on file  Social Connections: Not on file  Intimate Partner Violence: Not on file    Allergies as of 06/01/2021       Reactions   Codeine    Facial rash , lip swelling    Vicodin [hydrocodone-acetaminophen] Swelling, Rash   Pt is able to take percocet without problems         Medication List        Accurate as of June 01, 2021 11:59 PM. If you have any questions, ask your nurse or doctor.          STOP taking these medications    baclofen 10 MG tablet Commonly known as: LIORESAL Stopped by: Kathlene November, MD   traMADol 50 MG tablet Commonly known as: ULTRAM Stopped by: Kathlene November, MD       TAKE these medications    albuterol 108 (90 Base) MCG/ACT inhaler Commonly known as: Ventolin HFA Inhale 2 puffs into the lungs every 6 (six) hours as needed for wheezing or shortness of breath.   aspirin EC 81 MG tablet Take 81 mg by mouth daily.   atorvastatin 80 MG tablet Commonly known as: LIPITOR TAKE 1 TABLET BY MOUTH EVERY NIGHT AT BEDTIME   B-D UF III MINI PEN NEEDLES 31G X 5 MM Misc Generic drug: Insulin Pen Needle USE AS DIRECTED   ezetimibe 10 MG tablet Commonly known as: Zetia Take 1 tablet (10 mg total) by mouth daily.   Farxiga 5 MG Tabs tablet Generic drug: dapagliflozin propanediol TAKE 1 TABLET BY MOUTH DAILY BEFORE BEFORE BREAKFAST   folic acid 1 MG tablet Commonly known as: FOLVITE Take 1 mg by mouth daily.   gabapentin 300 MG capsule Commonly known as: NEURONTIN Take 3 caps at bedtime What changed:  how much to take how to take this when to take this additional instructions   losartan-hydrochlorothiazide 100-25 MG tablet Commonly known as: HYZAAR Take 1 tablet by mouth daily.   metFORMIN 500 MG 24 hr tablet Commonly known as: GLUCOPHAGE-XR TAKE 1 TABLET BY MOUTH DAILY WITH BREAKFAST   Mucinex Nasal Spray Full Force 0.05 % nasal spray Generic drug: oxymetazoline Place 1 spray into both nostrils 2 (two) times daily as needed for congestion.   pantoprazole 20 MG tablet Commonly known as: PROTONIX Take 1 tablet (20 mg total) by mouth daily.   SALINE NASAL MIST NA Place 1 spray into the nose as needed (congestion).   topiramate 50 MG tablet Commonly known as: TOPAMAX Take 1 tablet in AM, 2 tablets  in PM What changed:  how much to take how to take this when to take this additional instructions   Tresiba FlexTouch 100 UNIT/ML FlexTouch Pen Generic drug: insulin degludec INJECT 30 UNITS INTO THE SKIN DAILY   vitamin B-12 500 MCG tablet Commonly known as: CYANOCOBALAMIN Take 1,000 mcg by mouth daily.   vitamin C 100 MG tablet Take 100 mg by mouth 2 (two) times daily.           Objective:   Physical Exam BP 136/82 (BP Location: Left Arm, Patient  Position: Sitting, Cuff Size: Normal)   Pulse 80   Temp 98.2 F (36.8 C) (Oral)   Resp 18   Ht 5\' 7"  (1.702 m)   Wt 269 lb 2 oz (122.1 kg)   LMP 01/16/2015   BMI 42.15 kg/m  General: Well developed, NAD, BMI noted Neck: No  thyromegaly  HEENT:  Normocephalic . Face symmetric, atraumatic Lungs:  CTA B Normal respiratory effort, no intercostal retractions, no accessory muscle use. Heart: RRR,  no murmur.  Abdomen:  Not distended, soft, non-tender. No rebound or rigidity.   Lower extremities: no pretibial edema bilaterally  Skin: Exposed areas without rash. Not pale. Not jaundice Neurologic:  alert & oriented X3.  Speech normal, gait appropriate for age and unassisted Strength symmetric and appropriate for age.  Psych: Cognition and judgment appear intact.  Cooperative with normal attention span and concentration.  Behavior appropriate. No anxious or depressed appearing.     Assessment    ASSESSMENT HTN dx 04-2017 DM, no neuropathy Dyslipidemia GERD Asthma-- associated with URIs-allergies  Anxiety Morbid Obesity MSK: Dr Erlinda Hong, Dr Letta Pate --DJD. Multiple surgeries.  --Chronic back pain, local injections back ( sees  Dr. Ella Bodo) --Neck pain-- used to see  Dr Rolena Infante, Dr Nelva Bush now seen at [facility] Migraine headaches -- on topamax, baclofen, per Dr Delice Lesch Sickle cell trait CP: cardiac cath 2014 -- normal + FH CAD   PLAN: Here for CPX HTN: At the last visit, losartan was increased to Hyzaar, BP today is  satisfactory, at home is in the 130s / 80s.  No change, labs. DM: Strongly recommend to see Endo. High cholesterol: Last LDL elevated, she was on Lipitor, Zetia added, checking a fasting lipid profile today. Morbid obesity: Not making any progress.  Diet discussed RTC: 6 months    This visit occurred during the SARS-CoV-2 public health emergency.  Safety protocols were in place, including screening questions prior to the visit, additional usage of staff PPE, and extensive cleaning of exam room while observing appropriate contact time as indicated for disinfecting solutions.

## 2021-06-02 LAB — CBC WITH DIFFERENTIAL/PLATELET
Absolute Monocytes: 354 cells/uL (ref 200–950)
Basophils Absolute: 41 cells/uL (ref 0–200)
Basophils Relative: 0.6 %
Eosinophils Absolute: 48 cells/uL (ref 15–500)
Eosinophils Relative: 0.7 %
HCT: 42.9 % (ref 35.0–45.0)
Hemoglobin: 14 g/dL (ref 11.7–15.5)
Lymphs Abs: 2577 cells/uL (ref 850–3900)
MCH: 27.6 pg (ref 27.0–33.0)
MCHC: 32.6 g/dL (ref 32.0–36.0)
MCV: 84.6 fL (ref 80.0–100.0)
MPV: 11.2 fL (ref 7.5–12.5)
Monocytes Relative: 5.2 %
Neutro Abs: 3781 cells/uL (ref 1500–7800)
Neutrophils Relative %: 55.6 %
Platelets: 264 10*3/uL (ref 140–400)
RBC: 5.07 10*6/uL (ref 3.80–5.10)
RDW: 13.7 % (ref 11.0–15.0)
Total Lymphocyte: 37.9 %
WBC: 6.8 10*3/uL (ref 3.8–10.8)

## 2021-06-02 LAB — LIPID PANEL
Cholesterol: 259 mg/dL — ABNORMAL HIGH (ref <200)
HDL: 63 mg/dL (ref >=50)
LDL Cholesterol (Calc): 169 mg/dL (calc) — ABNORMAL HIGH
Non-HDL Cholesterol (Calc): 196 mg/dL (calc) — ABNORMAL HIGH (ref <130)
Total CHOL/HDL Ratio: 4.1 (calc) (ref <5.0)
Triglycerides: 131 mg/dL (ref <150)

## 2021-06-02 LAB — BASIC METABOLIC PANEL
BUN: 9 mg/dL (ref 7–25)
CO2: 25 mmol/L (ref 20–32)
Calcium: 9.5 mg/dL (ref 8.6–10.4)
Chloride: 98 mmol/L (ref 98–110)
Creat: 0.8 mg/dL (ref 0.50–1.03)
Glucose, Bld: 258 mg/dL — ABNORMAL HIGH (ref 65–99)
Potassium: 4.2 mmol/L (ref 3.5–5.3)
Sodium: 135 mmol/L (ref 135–146)

## 2021-06-02 LAB — TSH: TSH: 1.56 mIU/L

## 2021-06-02 LAB — VITAMIN D 25 HYDROXY (VIT D DEFICIENCY, FRACTURES): Vit D, 25-Hydroxy: 13 ng/mL — ABNORMAL LOW (ref 30–100)

## 2021-06-03 ENCOUNTER — Encounter: Payer: Self-pay | Admitting: Internal Medicine

## 2021-06-03 NOTE — Assessment & Plan Note (Addendum)
-  Tdap 2014 -pnm shot-2016; prevnar 2017 - shingrix d/w pt  -Covid shot: Rec  -Flu shot: Rec . -Vaccine hesitancy: aware she is at high risk for complications if she has a respiratory illness, pro>>cons discussed   -CCS: Never had a cscope, states she sent a cologuard kit (rx by Gyn) few days ago per pt   --Female care:  saw gyn July 2021, h/o abn PAPs, s/p hysterectomy, no oophorectomy, had a PAP per pt ; due for a check up, rec to call them   MMG 01/15/2020 Per  [facility], rec a f/u mammogram - (+) FH: DM,  breast cancer and CAD. - Labs:  BMP, FLP, CBC, TSH, vitamin D. -Diet-exercise discussed

## 2021-06-03 NOTE — Assessment & Plan Note (Signed)
ASSESSMENT HTN dx 04-2017 DM, no neuropathy Dyslipidemia GERD Asthma-- associated with URIs-allergies  Anxiety Morbid Obesity MSK: Dr Erlinda Hong, Dr Letta Pate --DJD. Multiple surgeries.  --Chronic back pain, local injections back ( sees  Dr. Ella Bodo) --Neck pain-- used to see  Dr Rolena Infante, Dr Nelva Bush now seen at [facility] Migraine headaches -- on topamax, baclofen, per Dr Delice Lesch Sickle cell trait CP: cardiac cath 2014 -- normal + FH CAD   PLAN: Here for CPX HTN: At the last visit, losartan was increased to Hyzaar, BP today is satisfactory, at home is in the 130s / 80s.  No change, labs. DM: Strongly recommend to see Endo. High cholesterol: Last LDL elevated, she was on Lipitor, Zetia added, checking a fasting lipid profile today. Morbid obesity: Not making any progress.  Diet discussed RTC: 6 months

## 2021-06-06 ENCOUNTER — Other Ambulatory Visit (HOSPITAL_BASED_OUTPATIENT_CLINIC_OR_DEPARTMENT_OTHER): Payer: Self-pay

## 2021-06-06 MED ORDER — VITAMIN D (ERGOCALCIFEROL) 1.25 MG (50000 UNIT) PO CAPS
50000.0000 [IU] | ORAL_CAPSULE | ORAL | 0 refills | Status: DC
  Filled 2021-06-06: qty 12, 84d supply, fill #0

## 2021-06-06 NOTE — Addendum Note (Signed)
Addended byDamita Dunnings D on: 06/06/2021 07:33 AM   Modules accepted: Orders

## 2021-06-08 ENCOUNTER — Other Ambulatory Visit (HOSPITAL_BASED_OUTPATIENT_CLINIC_OR_DEPARTMENT_OTHER): Payer: Self-pay

## 2021-06-08 ENCOUNTER — Other Ambulatory Visit: Payer: Self-pay | Admitting: Internal Medicine

## 2021-06-08 MED ORDER — PANTOPRAZOLE SODIUM 20 MG PO TBEC
20.0000 mg | DELAYED_RELEASE_TABLET | Freq: Every day | ORAL | 1 refills | Status: AC
  Filled 2021-06-08: qty 90, 90d supply, fill #0

## 2021-06-10 NOTE — Progress Notes (Signed)
Name: [name]  MRN/ DOB: [id number], 1966-03-16   Age/ Sex: 55 y.o., female    PCP: Colon Branch, MD   Reason for Endocrinology Evaluation: Type 2 Diabetes Mellitus     Date of Initial Endocrinology Visit: 08/10/2020    PATIENT IDENTIFIER: Ms. [name] is a 55 y.o. female with a past medical history of T2Dm, sickle cell trait, Asthma and Dyslipidemia The patient presented for initial endocrinology clinic visit on 08/10/2020 for consultative assistance with her diabetes management.       DIABETIC HISTORY:  [name] was diagnosed with DM in 2014 . Her hemoglobin A1c has ranged from 6.4% in 2016, peaking at > 14.0 %in 2021   ON her initial visit to our clinic her A1c was 11.6%. She was on basal insulin, Metformin,and  Glimepiride. We stopped Glimepiride, started Farxiga, Switched metformin to XR  due to diarrhea and increased basal insulin    SUBJECTIVE:   During the last visit (08/10/2020): A1c 11.6%. ON her initial visit to our clinic her A1c was 11.6%.We stopped Glimepiride, started Farxiga, Switched metformin to XR and increased basal insulin      Today (06/14/21): [name] is here for a follow up on diabetes management. She has NOT been to our office in 9 months.   She  checks her blood sugars 1 times daily,did not bring today. The patient has not had hypoglycemic episodes since the last clinic visit.  Metformin causes upset stomach  Admits to dietary indiscretions , drinks occasional sweet tea     HOME DIABETES REGIMEN: Metformin 500 mg XR daily - not taking  Farxiga 5 mg daily  Tresiba 30 units daily     Statin: yes ACE-I/ARB: yes Prior Diabetic Education: no   METER DOWNLOAD SUMMARY: Did not bring      DIABETIC COMPLICATIONS: Microvascular complications:   Denies: CKD, retinopathy, neuropathy  Last eye exam: Completed 08/2019- has an appointment in 2 weeks   Macrovascular complications:   Denies: CAD, PVD, CVA   PAST HISTORY: Past Medical  History:  Past Medical History:  Diagnosis Date  . Anxiety state, unspecified 05/16/2013  . Arthritis    "right knee" (08/27/2013)  . Asthma    "only when I get a sinus infection which is 1-2 X/yr" (08/27/2013)  . Chronic pain syndrome   . Diabetes ([place])    "borderline" (08/27/2013)  . Failed back syndrome, cervical   . GERD (gastroesophageal reflux disease)   . Hypertension    no meds  . Lumbar radiculopathy, chronic   . Migraine    "once or twice/month now" (08/27/2013)  . Neck pain    axial, s/p cervical fusion C5-6, C6-7   . Obesity   . Sickle cell trait ([place])   . Wears glasses    Past Surgical History:  Past Surgical History:  Procedure Laterality Date  . ANTERIOR CERVICAL DECOMP/DISCECTOMY FUSION N/A 08/27/2013   Procedure: ANTERIOR CERVICAL /DISCECTOMY FUSION (ACDF) C5-C7  2 LEVELS;  Surgeon: Melina Schools, MD;  Location: [facility];  Service: Orthopedics;  Laterality: N/A;  . BREAST REDUCTION SURGERY Bilateral 09/28/2014   Procedure: MAMMARY REDUCTION  (BREAST) BILATERAL;  Surgeon: Cristine Polio, MD;  Location: [facility];  Service: Plastics;  Laterality: Bilateral;  . CHOLECYSTECTOMY  06/1989  . DILATION AND CURETTAGE OF UTERUS    . HERNIA REPAIR    . KNEE ARTHROSCOPY Left 08/2003  . LAPAROSCOPIC BILATERAL SALPINGECTOMY Bilateral 01/22/2015   Procedure: LAPAROSCOPIC BILATERAL SALPINGECTOMY;  Surgeon: Criss Alvine  Benjie Karvonen, MD;  Location: [facility];  Service: Gynecology;  Laterality: Bilateral;  . LEFT HEART CATHETERIZATION WITH CORONARY ANGIOGRAM N/A 04/15/2013   Procedure: LEFT HEART CATHETERIZATION WITH CORONARY ANGIOGRAM;  Surgeon: Peter M Martinique, MD;  Location: [facility] CATH LAB;  Service: Cardiovascular;  Laterality: N/A;  . LIPOSUCTION Bilateral 09/28/2014   Procedure: LIPOSUCTION;  Surgeon: Cristine Polio, MD;  Location: [facility];  Service: Plastics;  Laterality: Bilateral;  . ROBOTIC ASSISTED TOTAL HYSTERECTOMY N/A 01/22/2015   has her ovaries---ROBOTIC  ASSISTED TOTAL HYSTERECTOMY With Pelvic Washings, Lysis of Adhesions;  Surgeon: Azucena Fallen, MD;  Location: [facility];  Service: Gynecology;  Laterality: N/A;  . SHOULDER ARTHROSCOPY W/ ROTATOR CUFF REPAIR Right 2006, 2007  . TUBAL LIGATION  1991  . UMBILICAL HERNIA REPAIR  2001, 2003  . [place] EXTRACTION  1985    Social History:  reports that she has never smoked. She has never used smokeless tobacco. She reports that she does not drink alcohol and does not use drugs. Family History:  Family History  Problem Relation Age of Onset  . Breast cancer Other         GM  . Diabetes Father        F, B, S  . CAD Father        died of MI @ 72  . Kidney disease Father   . Pancreatic cancer Other        GM  . CAD Sister        MI at age 9  . CAD Brother        CHF, died at age 42  . Heart attack Brother        died suddenly @ 36  . Leukemia Sister   . Colon cancer Neg Hx      HOME MEDICATIONS: Allergies as of 06/14/2021       Reactions   Codeine    Facial rash , lip swelling    Vicodin [hydrocodone-acetaminophen] Swelling, Rash   Pt is able to take percocet without problems        Medication List        Accurate as of June 14, 2021  7:11 AM. If you have any questions, ask your nurse or doctor.          albuterol 108 (90 Base) MCG/ACT inhaler Commonly known as: Ventolin HFA Inhale 2 puffs into the lungs every 6 (six) hours as needed for wheezing or shortness of breath.   aspirin EC 81 MG tablet Take 81 mg by mouth daily.   atorvastatin 80 MG tablet Commonly known as: LIPITOR TAKE 1 TABLET BY MOUTH EVERY NIGHT AT BEDTIME   B-D UF III MINI PEN NEEDLES 31G X 5 MM Misc Generic drug: Insulin Pen Needle USE AS DIRECTED   ezetimibe 10 MG tablet Commonly known as: Zetia Take 1 tablet (10 mg total) by mouth daily.   Farxiga 5 MG Tabs tablet Generic drug: dapagliflozin propanediol TAKE 1 TABLET BY MOUTH DAILY BEFORE BEFORE BREAKFAST   folic acid 1 MG  tablet Commonly known as: FOLVITE Take 1 mg by mouth daily.   gabapentin 300 MG capsule Commonly known as: NEURONTIN Take 3 caps at bedtime What changed:  how much to take how to take this when to take this additional instructions   losartan-hydrochlorothiazide 100-25 MG tablet Commonly known as: HYZAAR Take 1 tablet by mouth daily.   metFORMIN 500 MG 24 hr tablet Commonly known as: GLUCOPHAGE-XR TAKE 1 TABLET BY  MOUTH DAILY WITH BREAKFAST   Mucinex Nasal Spray Full Force 0.05 % nasal spray Generic drug: oxymetazoline Place 1 spray into both nostrils 2 (two) times daily as needed for congestion.   pantoprazole 20 MG tablet Commonly known as: PROTONIX Take 1 tablet (20 mg total) by mouth daily.   SALINE NASAL MIST NA Place 1 spray into the nose as needed (congestion).   topiramate 50 MG tablet Commonly known as: TOPAMAX Take 1 tablet in AM, 2 tablets in PM What changed:  how much to take how to take this when to take this additional instructions   Tresiba FlexTouch 100 UNIT/ML FlexTouch Pen Generic drug: insulin degludec INJECT 30 UNITS INTO THE SKIN DAILY   vitamin B-12 500 MCG tablet Commonly known as: CYANOCOBALAMIN Take 1,000 mcg by mouth daily.   vitamin C 100 MG tablet Take 100 mg by mouth 2 (two) times daily.   Vitamin D (Ergocalciferol) 1.25 MG (50000 UNIT) Caps capsule Commonly known as: DRISDOL Take 1 capsule (50,000 Units total) by mouth every 7 (seven) days.         ALLERGIES: Allergies  Allergen Reactions  . Codeine     Facial rash , lip swelling   . Vicodin [Hydrocodone-Acetaminophen] Swelling and Rash    Pt is able to take percocet without problems     REVIEW OF SYSTEMS: A comprehensive ROS was conducted with the patient and is negative except as per HPI and below:  Review of Systems  Gastrointestinal:  Positive for diarrhea and nausea.  Neurological:  Negative for tingling.  Endo/Heme/Allergies:  Positive for polydipsia.      OBJECTIVE:   VITAL SIGNS: BP 126/72 (BP Location: Left Arm, Patient Position: Sitting, Cuff Size: Large)   Pulse 88   Ht 5\' 7"  (1.702 m)   Wt 261 lb 6.4 oz (118.6 kg)   LMP 01/16/2015   SpO2 97%   BMI 40.94 kg/m    PHYSICAL EXAM:  General: Pt appears well and is in NAD  Neck: General: Supple without adenopathy or carotid bruits. Thyroid: Thyroid size normal.  No goiter or nodules appreciated. No thyroid bruit.  Lungs: Clear with good BS bilat with no rales, rhonchi, or wheezes  Heart: RRR with normal S1 and S2 and no gallops; no murmurs; no rub  Abdomen: Normoactive bowel sounds, soft, nontender, without masses or organomegaly palpable  Extremities:  Lower extremities - No pretibial edema. No lesions.  Skin: Normal texture and temperature to palpation.  Neuro: MS is good with appropriate affect, pt is alert and Ox3    DM foot exam: 08/10/2020  The skin of the feet is intact without sores or ulcerations. The pedal pulses are 2+ on right and 2+ on left. The sensation is intact to a screening 5.07, 10 gram monofilament bilaterally   DATA REVIEWED:  Lab Results  Component Value Date   HGBA1C 11.6 (A) 08/10/2020   HGBA1C 10.2 (H) 05/28/2020   HGBA1C >14.0 (H) 01/29/2020    Latest Reference Range & Units 06/01/21 14:16  Sodium 135 - 146 mmol/L 135  Potassium 3.5 - 5.3 mmol/L 4.2  Chloride 98 - 110 mmol/L 98  CO2 20 - 32 mmol/L 25  Glucose 65 - 99 mg/dL 258 (H)  BUN 7 - 25 mg/dL 9  Creatinine 0.50 - 1.03 mg/dL 0.80  Calcium 8.6 - 10.4 mg/dL 9.5  BUN/Creatinine Ratio 6 - 22 (calc) NOT APPLICABLE    Latest Reference Range & Units 06/01/21 14:16  Total CHOL/HDL Ratio <5.0 (calc) 4.1  Cholesterol <200 mg/dL 259 (H)  HDL Cholesterol > OR = 50 mg/dL 63  LDL Cholesterol (Calc) mg/dL (calc) 169 (H)  Non-HDL Cholesterol (Calc) <130 mg/dL (calc) 196 (H)  Triglycerides <150 mg/dL 131    Latest Reference Range & Units 06/01/21 14:16  TSH mIU/L 1.56     In Office 257 mg/dL    ASSESSMENT / PLAN / RECOMMENDATIONS:   1) Type 2 Diabetes Mellitus, Poorly controlled, Without complications - Most recent A1c of 13.1 %. Goal A1c < 7.0%.    Poorly controlled due to medication non adherence and dietary indiscretions  She declined adding prandial insulin in the past, we discussed this again today as her A1c has worsened from 11.6% to 13.1%  We opted in trying Pioglitazone , cautioned against weight gain and LE swelling  Will stop Metformin as she is unable to tolerate one tablet of Metformin XR   MEDICATIONS:  - Stop Metformin  - Start Actos 30 mg, HALF a tablet daily for 2 weeks, then increase to 1 tablet daily after that  - Increase Farxiga 10  mg, 1 tablet with Breakfast  - INcrease Tresiba to 34 units daily     EDUCATION / INSTRUCTIONS: BG monitoring instructions: Patient is instructed to check her blood sugars 2 times a day, fasting and bedtime  Call [facility] if: BG persistently < 70  I reviewed the Rule of 15 for the treatment of hypoglycemia in detail with the patient. Literature supplied.   2) Diabetic complications:  Eye: Does no have known diabetic retinopathy.  Neuro/ Feet: Does not have known diabetic peripheral neuropathy. Renal: Patient does not have known baseline CKD. She is on an ACEI/ARB at present.     3) Dyslipidemia :    - She was under the impression that Zetia replaced Atorvastatin but I explained to her that she needs to be on BOTH  - We discussed above goal LDL and her high risk for CVA and CAD   Medication  Continue Atorvastatin 80 mg daily  Continue Zetia 10 mg daily     F/U in 3 months     Signed electronically by: [name] Guise, MD  [facility]  [address] [place], [place] [address] Phone: [phone] FAX: [phone]   CC: Colon Branch, [address] [place] [address] Phone: [phone]  Fax:  [phone]    Return to Endocrinology clinic as below: Future Appointments  Date Time Provider [place]  06/14/2021  8:10 AM Randell Detter, Melanie Crazier, MD [facility]  11/29/2021  9:00 AM Colon Branch, MD [facility]

## 2021-06-14 ENCOUNTER — Encounter: Payer: Self-pay | Admitting: Internal Medicine

## 2021-06-14 ENCOUNTER — Ambulatory Visit: Payer: 59 | Admitting: Internal Medicine

## 2021-06-14 ENCOUNTER — Other Ambulatory Visit (HOSPITAL_BASED_OUTPATIENT_CLINIC_OR_DEPARTMENT_OTHER): Payer: Self-pay

## 2021-06-14 VITALS — BP 126/72 | HR 88 | Ht 67.0 in | Wt 261.4 lb

## 2021-06-14 DIAGNOSIS — E785 Hyperlipidemia, unspecified: Secondary | ICD-10-CM | POA: Diagnosis not present

## 2021-06-14 DIAGNOSIS — E1165 Type 2 diabetes mellitus with hyperglycemia: Secondary | ICD-10-CM | POA: Diagnosis not present

## 2021-06-14 LAB — POCT GLYCOSYLATED HEMOGLOBIN (HGB A1C): Hemoglobin A1C: 13.1 % — AB (ref 4.0–5.6)

## 2021-06-14 LAB — GLUCOSE, POCT (MANUAL RESULT ENTRY): POC Glucose: 257 mg/dl — AB (ref 70–99)

## 2021-06-14 MED ORDER — INSULIN PEN NEEDLE 31G X 5 MM MISC
1.0000 | 2 refills | Status: DC
Start: 2021-06-14 — End: 2022-05-03
  Filled 2021-06-14: qty 100, 90d supply, fill #0
  Filled 2022-04-14: qty 100, 50d supply, fill #0

## 2021-06-14 MED ORDER — PIOGLITAZONE HCL 30 MG PO TABS
30.0000 mg | ORAL_TABLET | Freq: Every day | ORAL | 2 refills | Status: AC
  Filled 2021-06-14: qty 90, 90d supply, fill #0
  Filled 2022-04-14: qty 90, 90d supply, fill #1

## 2021-06-14 MED ORDER — DAPAGLIFLOZIN PROPANEDIOL 10 MG PO TABS
10.0000 mg | ORAL_TABLET | Freq: Every day | ORAL | 2 refills | Status: DC
  Filled 2021-06-14 – 2021-06-17 (×2): qty 90, 90d supply, fill #0
  Filled 2021-09-26: qty 90, 90d supply, fill #1

## 2021-06-14 MED ORDER — INSULIN DEGLUDEC 100 UNIT/ML ~~LOC~~ SOPN
34.0000 [IU] | PEN_INJECTOR | Freq: Every day | SUBCUTANEOUS | 2 refills | Status: DC
  Filled 2021-06-14: qty 30, 88d supply, fill #0
  Filled 2021-06-15: qty 15, 44d supply, fill #0
  Filled 2021-06-15 (×2): qty 30, 88d supply, fill #0
  Filled 2022-04-14: qty 15, 44d supply, fill #1

## 2021-06-14 NOTE — Patient Instructions (Signed)
-   Stop Metformin  - Start Actos 30 mg, HALF a tablet daily for 2 weeks, then increase to 1 tablet daily after that  - Increase Farxiga 10  mg, 1 tablet with Breakfast  - INcrease Tresiba to 34 units daily     HOW TO TREAT LOW BLOOD SUGARS (Blood sugar LESS THAN 70 MG/DL) Please follow the RULE OF 15 for the treatment of hypoglycemia treatment (when your (blood sugars are less than 70 mg/dL)   STEP 1: Take 15 grams of carbohydrates when your blood sugar is low, which includes:  3-4 GLUCOSE TABS  OR 3-4 OZ OF JUICE OR REGULAR SODA OR ONE TUBE OF GLUCOSE GEL    STEP 2: RECHECK blood sugar in 15 MINUTES STEP 3: If your blood sugar is still low at the 15 minute recheck --> then, go back to STEP 1 and treat AGAIN with another 15 grams of carbohydrates.

## 2021-06-15 ENCOUNTER — Encounter: Payer: Self-pay | Admitting: Internal Medicine

## 2021-06-15 ENCOUNTER — Other Ambulatory Visit (HOSPITAL_BASED_OUTPATIENT_CLINIC_OR_DEPARTMENT_OTHER): Payer: Self-pay

## 2021-06-17 ENCOUNTER — Other Ambulatory Visit (HOSPITAL_BASED_OUTPATIENT_CLINIC_OR_DEPARTMENT_OTHER): Payer: Self-pay

## 2021-06-17 ENCOUNTER — Other Ambulatory Visit (HOSPITAL_COMMUNITY): Payer: Self-pay

## 2021-08-18 ENCOUNTER — Encounter: Payer: Self-pay | Admitting: Internal Medicine

## 2021-08-23 ENCOUNTER — Encounter: Payer: Self-pay | Admitting: Internal Medicine

## 2021-09-08 ENCOUNTER — Other Ambulatory Visit: Payer: Self-pay

## 2021-09-13 ENCOUNTER — Ambulatory Visit: Payer: Self-pay | Admitting: Internal Medicine

## 2021-09-26 ENCOUNTER — Other Ambulatory Visit: Payer: Self-pay | Admitting: Internal Medicine

## 2021-09-27 ENCOUNTER — Other Ambulatory Visit (HOSPITAL_BASED_OUTPATIENT_CLINIC_OR_DEPARTMENT_OTHER): Payer: Self-pay

## 2021-09-27 MED ORDER — ATORVASTATIN CALCIUM 80 MG PO TABS
ORAL_TABLET | Freq: Every day | ORAL | 1 refills | Status: DC
  Filled 2021-09-27: qty 90, 90d supply, fill #0

## 2021-10-01 ENCOUNTER — Encounter (HOSPITAL_BASED_OUTPATIENT_CLINIC_OR_DEPARTMENT_OTHER): Payer: Self-pay | Admitting: Emergency Medicine

## 2021-10-01 ENCOUNTER — Other Ambulatory Visit: Payer: Self-pay

## 2021-10-01 ENCOUNTER — Emergency Department (HOSPITAL_BASED_OUTPATIENT_CLINIC_OR_DEPARTMENT_OTHER): Payer: Medicaid Other

## 2021-10-01 ENCOUNTER — Emergency Department (HOSPITAL_BASED_OUTPATIENT_CLINIC_OR_DEPARTMENT_OTHER)
Admission: EM | Admit: 2021-10-01 | Discharge: 2021-10-02 | Disposition: A | Discharge status: Home / Self Care | Payer: Medicaid Other | Attending: Emergency Medicine | Admitting: Emergency Medicine

## 2021-10-01 DIAGNOSIS — Z7984 Long term (current) use of oral hypoglycemic drugs: Secondary | ICD-10-CM | POA: Insufficient documentation

## 2021-10-01 DIAGNOSIS — Z794 Long term (current) use of insulin: Secondary | ICD-10-CM | POA: Insufficient documentation

## 2021-10-01 DIAGNOSIS — M5412 Radiculopathy, cervical region: Secondary | ICD-10-CM | POA: Insufficient documentation

## 2021-10-01 DIAGNOSIS — K219 Gastro-esophageal reflux disease without esophagitis: Secondary | ICD-10-CM | POA: Insufficient documentation

## 2021-10-01 DIAGNOSIS — E119 Type 2 diabetes mellitus without complications: Secondary | ICD-10-CM | POA: Insufficient documentation

## 2021-10-01 DIAGNOSIS — I10 Essential (primary) hypertension: Secondary | ICD-10-CM | POA: Insufficient documentation

## 2021-10-01 DIAGNOSIS — Z79899 Other long term (current) drug therapy: Secondary | ICD-10-CM | POA: Insufficient documentation

## 2021-10-01 DIAGNOSIS — Z7982 Long term (current) use of aspirin: Secondary | ICD-10-CM | POA: Insufficient documentation

## 2021-10-01 LAB — CBC
HCT: 43.3 % (ref 36.0–46.0)
Hemoglobin: 14.6 g/dL (ref 12.0–15.0)
MCH: 27.4 pg (ref 26.0–34.0)
MCHC: 33.7 g/dL (ref 30.0–36.0)
MCV: 81.2 fL (ref 80.0–100.0)
Platelets: 269 10*3/uL (ref 150–400)
RBC: 5.33 MIL/uL — ABNORMAL HIGH (ref 3.87–5.11)
RDW: 13.4 % (ref 11.5–15.5)
WBC: 7.3 10*3/uL (ref 4.0–10.5)
nRBC: 0 % (ref 0.0–0.2)

## 2021-10-01 LAB — BASIC METABOLIC PANEL
Anion gap: 10 (ref 5–15)
BUN: 20 mg/dL (ref 6–20)
CO2: 27 mmol/L (ref 22–32)
Calcium: 9.4 mg/dL (ref 8.9–10.3)
Chloride: 101 mmol/L (ref 98–111)
Creatinine, Ser: 1.33 mg/dL — ABNORMAL HIGH (ref 0.44–1.00)
GFR, Estimated: 47 mL/min — ABNORMAL LOW (ref >60)
Glucose, Bld: 224 mg/dL — ABNORMAL HIGH (ref 70–99)
Potassium: 4.2 mmol/L (ref 3.5–5.1)
Sodium: 138 mmol/L (ref 135–145)

## 2021-10-01 LAB — TROPONIN I (HIGH SENSITIVITY): Troponin I (High Sensitivity): 5 ng/L (ref <18)

## 2021-10-01 MED ORDER — PANTOPRAZOLE SODIUM 40 MG IV SOLR
40.0000 mg | Freq: Once | INTRAVENOUS | Status: AC
  Administered 2021-10-01: 40 mg via INTRAVENOUS
  Filled 2021-10-01: qty 10

## 2021-10-01 MED ORDER — PANTOPRAZOLE SODIUM 20 MG PO TBEC
20.0000 mg | DELAYED_RELEASE_TABLET | Freq: Every day | ORAL | 0 refills | Status: DC
  Filled 2021-10-01: qty 15, 15d supply, fill #0

## 2021-10-01 NOTE — Discharge Instructions (Addendum)
I have sent you in a new prescription for your Protonix should you develop symptoms of acid reflux again.  Avoid eating acidic or spicy foods prevent recurrence. ? ?For your neck pain, please follow-up with the surgeon who did your surgery several years ago as you may need evaluation or possible physical therapy.  ? ?Return if you develop worsening chest pain or shortness of breath. ?

## 2021-10-01 NOTE — ED Triage Notes (Signed)
Pt presents with pain that starts in her left neck and radiates down the left arm x a few months.  ? ?Also c/o central chest pressure and burping x 3 hours. Denies n/v. Hx of GERD. Chest discomfort started after eating salsa tonight.  ?

## 2021-10-01 NOTE — ED Provider Notes (Addendum)
?[facility] EMERGENCY DEPARTMENT ?Provider Note ? ? ?CSN: [id number] ?Arrival date & time: 10/01/21  2209 ? ?  ? ?History ? ?Chief Complaint  ?Patient presents with  ? Arm Pain  ? Chest Pain  ? ? ?[name] is a 56 y.o. female with history of hypertension, GERD, type 2 diabetes and history of anterior cervical discectomy fusion of C5-C7 done in 2015 presents to the ED for evaluation of substernal chest pain that began about  5 hours prior to my evaluation.  Patient describes the pain as a dull pressure, similar to previous episodes of acid reflux.  She notes that her pain is usually resolved after taking Protonix however she has been on this medication and was unable to take it.  Pain started shortly after eating a jar of salsa which she believes triggered her symptoms.  She denies abdominal pain, nausea, vomiting and diarrhea.  Additionally, patient has an unrelated complaint of left neck/arm pain.  Patient states that she feels pain starting from the center of her neck that shoots down her left arm into her fingertips.  Pain is worse upon movement.  No treatment prior to arrival. ? ? ?Arm Pain ?Associated symptoms include chest pain.  ?Chest Pain ? ?  ? ?Home Medications ?Prior to Admission medications   ?Medication Sig Start Date End Date Taking? Authorizing Provider  ?pantoprazole (PROTONIX) 20 MG tablet Take 1 tablet (20 mg total) by mouth daily. 10/01/21  Yes Kathe Becton R, PA-C  ?albuterol (VENTOLIN HFA) 108 (90 Base) MCG/ACT inhaler Inhale 2 puffs into the lungs every 6 (six) hours as needed for wheezing or shortness of breath. 01/29/20   Colon Branch, MD  ?Ascorbic Acid (VITAMIN C) 100 MG tablet Take 100 mg by mouth 2 (two) times daily.     [provider]  ?aspirin EC 81 MG tablet Take 81 mg by mouth daily.    [provider]  ?atorvastatin (LIPITOR) 80 MG tablet TAKE 1 TABLET BY MOUTH EVERY NIGHT AT BEDTIME 09/27/21   Colon Branch, MD  ?dapagliflozin propanediol (FARXIGA) 10  MG TABS tablet Take 1 tablet (10 mg total) by mouth daily before breakfast. 06/14/21   Shamleffer, Melanie Crazier, MD  ?ezetimibe (ZETIA) 10 MG tablet Take 1 tablet (10 mg total) by mouth daily. 02/18/21   Colon Branch, MD  ?folic acid (FOLVITE) 1 MG tablet Take 1 mg by mouth daily.    [provider]  ?gabapentin (NEURONTIN) 300 MG capsule Take 3 caps at bedtime ?Patient taking differently: Take 900 mg by mouth at bedtime. 01/29/20   Colon Branch, MD  ?insulin degludec (TRESIBA) 100 UNIT/ML FlexTouch Pen Inject 34 Units into the skin daily. 06/14/21   Shamleffer, Melanie Crazier, MD  ?Insulin Pen Needle 31G X 5 MM MISC Inject 1 Device into the skin See admin instructions. 06/14/21   Shamleffer, Melanie Crazier, MD  ?losartan-hydrochlorothiazide (HYZAAR) 100-25 MG tablet Take 1 tablet by mouth daily. 02/18/21   Colon Branch, MD  ?oxymetazoline [facility] NASAL SPRAY FULL FORCE) 0.05 % nasal spray Place 1 spray into both nostrils 2 (two) times daily as needed for congestion.    [provider]  ?pantoprazole (PROTONIX) 20 MG tablet Take 1 tablet (20 mg total) by mouth daily. 06/08/21   Colon Branch, MD  ?pioglitazone (ACTOS) 30 MG tablet Take 1 tablet (30 mg total) by mouth daily. 06/14/21   Shamleffer, Melanie Crazier, MD  ?SALINE NASAL MIST NA Place 1 spray into  the nose as needed (congestion).    [provider]  ?topiramate (TOPAMAX) 50 MG tablet Take 1 tablet in AM, 2 tablets in PM ?Patient taking differently: Take 50-100 mg by mouth See admin instructions. Take '50mg'$  in the morning and '100mg'$  at bedtime. 01/29/20   Colon Branch, MD  ?vitamin B-12 (CYANOCOBALAMIN) 500 MCG tablet Take 1,000 mcg by mouth daily.     [provider]  ?Vitamin D, Ergocalciferol, (DRISDOL) 1.25 MG (50000 UNIT) CAPS capsule Take 1 capsule (50,000 Units total) by mouth every 7 (seven) days. 06/06/21   Colon Branch, MD  ?   ? ?Allergies    ?Codeine and Vicodin [hydrocodone-acetaminophen]   ? ?Review of Systems    ?Review of Systems  ?Cardiovascular:  Positive for chest pain.  ? ?Physical Exam ?Updated Vital Signs ?BP (!) 148/88   Pulse 79   Temp 98.5 ?F (36.9 ?C) (Oral)   Resp 14   LMP 01/16/2015   SpO2 98%  ?Physical Exam ?Vitals and nursing note reviewed.  ?Constitutional:   ?   General: She is not in acute distress. ?   Appearance: She is not ill-appearing.  ?HENT:  ?   Head: Atraumatic.  ?Eyes:  ?   Conjunctiva/sclera: Conjunctivae normal.  ?Neck:  ?   Comments: ROM normal, radicular pain instigated with turning head left and with compression test.  Negative midline tenderness or bony step-offs. ?Cardiovascular:  ?   Rate and Rhythm: Normal rate and regular rhythm.  ?   Pulses: Normal pulses.  ?   Heart sounds: No murmur heard. ?Pulmonary:  ?   Effort: Pulmonary effort is normal. No respiratory distress.  ?   Breath sounds: Normal breath sounds.  ?Abdominal:  ?   General: Abdomen is flat. There is no distension.  ?   Palpations: Abdomen is soft.  ?   Tenderness: There is no abdominal tenderness.  ?Musculoskeletal:     ?   General: Normal range of motion.  ?   Cervical back: Normal range of motion.  ?Skin: ?   General: Skin is warm and dry.  ?   Capillary Refill: Capillary refill takes less than 2 seconds.  ?Neurological:  ?   General: No focal deficit present.  ?   Mental Status: She is alert.  ?Psychiatric:     ?   Mood and Affect: Mood normal.  ? ? ?ED Results / Procedures / Treatments   ?Labs ?(all labs ordered are listed, but only abnormal results are displayed) ?Labs Reviewed  ?BASIC METABOLIC PANEL - Abnormal; Notable for the following components:  ?    Result Value  ? Glucose, Bld 224 (*)   ? Creatinine, Ser 1.33 (*)   ? GFR, Estimated 47 (*)   ? All other components within normal limits  ?CBC - Abnormal; Notable for the following components:  ? RBC 5.33 (*)   ? All other components within normal limits  ?TROPONIN I (HIGH SENSITIVITY)  ?TROPONIN I (HIGH SENSITIVITY)  ? ? ?EKG ?EKG  Interpretation ? ?Date/Time:  Saturday October 01 2021 22:20:07 EDT ?Ventricular Rate:  80 ?PR Interval:  171 ?QRS Duration: 88 ?QT Interval:  384 ?QTC Calculation: 443 ?R Axis:   34 ?Text Interpretation: Sinus rhythm No significant change since last tracing Confirmed by Wandra Arthurs [phone]) on 10/01/2021 10:22:56 PM ? ?Radiology ?DG Chest Port 1 View ? ?Result Date: 10/01/2021 ?CLINICAL DATA:  Chest pain EXAM: PORTABLE CHEST 1 VIEW COMPARISON:  02/13/2020 FINDINGS: The heart size  and mediastinal contours are within normal limits. Both lungs are clear. The visualized skeletal structures are unremarkable. Postsurgical changes in the cervical spine are seen. IMPRESSION: No active disease. Electronically Signed   By: Inez Catalina M.D.   On: 10/01/2021 23:13   ? ?Procedures ?Procedures  ? ? ?Medications Ordered in ED ?Medications  ?pantoprazole (PROTONIX) injection 40 mg (40 mg Intravenous Given 10/01/21 2359)  ? ? ?ED Course/ Medical Decision Making/ A&P ?  ?                        ?Medical Decision Making ?Amount and/or Complexity of Data Reviewed ?Labs: ordered. ?Radiology: ordered. ? ?Risk ?Prescription drug management. ? ? ?History:  ?Per HPI ?Social determinants of health: none ? ?Initial impression: ? ?This patient presents to the ED for concern of substernal chest pain and left neck/shoulder pain, this involves an extensive number of treatment options, and is a complaint that carries with it a high risk of complications and morbidity.    ?Patient is overall well-appearing in no acute distress.  She is nontoxic-appearing her vitals are normal.  She was documented to have blood pressures at 134/100, upon my reevaluation she was down to 145/94.  She notes that this is around what her normal is at home.  Patient has positive compression test.  Radicular symptoms likely due to postsurgical changes of her previous surgery.  I did consider imaging, however she has not had a recent traumatic injury and she has known cervical  vertebrae issues in the past and did not feel that imaging would change treatment plan.  Physical exam otherwise benign.  We will do cardiac work-up and give Protonix. ? ? ?Lab Tests and EKG: ? ?I Ordered, reviewed, and interpreted labs a

## 2021-10-03 ENCOUNTER — Other Ambulatory Visit (HOSPITAL_BASED_OUTPATIENT_CLINIC_OR_DEPARTMENT_OTHER): Payer: Self-pay

## 2021-10-06 ENCOUNTER — Other Ambulatory Visit (HOSPITAL_BASED_OUTPATIENT_CLINIC_OR_DEPARTMENT_OTHER): Payer: Self-pay

## 2021-10-13 ENCOUNTER — Telehealth: Payer: Self-pay

## 2021-10-13 NOTE — Telephone Encounter (Signed)
Vm and my chart message sent  ?

## 2021-10-13 NOTE — Telephone Encounter (Signed)
Patient states that she is unable to afford the [place]. She is out of the medication and has been taking the Actos and the [place].  ?

## 2021-10-17 ENCOUNTER — Other Ambulatory Visit: Payer: Self-pay | Admitting: Nurse Practitioner

## 2021-10-17 DIAGNOSIS — Z1231 Encounter for screening mammogram for malignant neoplasm of breast: Secondary | ICD-10-CM

## 2021-10-18 ENCOUNTER — Ambulatory Visit: Payer: Self-pay | Admitting: Internal Medicine

## 2021-11-03 ENCOUNTER — Ambulatory Visit: Payer: Self-pay | Admitting: Internal Medicine

## 2021-11-03 NOTE — Progress Notes (Deleted)
Name: [name]  MRN/ DOB: [id number], 15-Sep-1965   Age/ Sex: 56 y.o., female    PCP: Colon Branch, MD   Reason for Endocrinology Evaluation: Type 2 Diabetes Mellitus     Date of Initial Endocrinology Visit: 08/10/2020    PATIENT IDENTIFIER: [name] is a 56 y.o. female with a past medical history of T2Dm, sickle cell trait, Asthma and Dyslipidemia The patient presented for initial endocrinology clinic visit on 08/10/2020 for consultative assistance with her diabetes management.       DIABETIC HISTORY:  Ms. Mofield was diagnosed with DM in 2014 . Her hemoglobin A1c has ranged from 6.4% in 2016, peaking at > 14.0 %in 2021   ON her initial visit to our clinic her A1c was 11.6%. She was on basal insulin, Metformin,and  Glimepiride. We stopped Glimepiride, started Farxiga, Switched metformin to XR  due to diarrhea and increased basal insulin   Stopped metformin and started pioglitazone 06/2021    SUBJECTIVE:   During the last visit (06/14/2021): A1c 13.1%. stopped Metformin, started actos, increased farxiga and tresiba      Today (11/03/21): [name] is here for a follow up on diabetes management.  She  checks her blood sugars 1 times daily,did not bring today. The patient has not had hypoglycemic episodes since the last clinic visit.   Admits to dietary indiscretions , drinks occasional sweet tea     HOME DIABETES REGIMEN: Pioglitazone 30 mg daily  Farxiga 10 mg daily  Tresiba 34 units daily     Statin: yes ACE-I/ARB: yes Prior Diabetic Education: no   METER DOWNLOAD SUMMARY: Did not bring      DIABETIC COMPLICATIONS: Microvascular complications:   Denies: CKD, retinopathy, neuropathy  Last eye exam: Completed 08/2019- has an appointment in 2 weeks   Macrovascular complications:   Denies: CAD, PVD, CVA   PAST HISTORY: Past Medical History:  Past Medical History:  Diagnosis Date   Anxiety state, unspecified 05/16/2013   Arthritis    "right  knee" (08/27/2013)   Asthma    "only when I get a sinus infection which is 1-2 X/yr" (08/27/2013)   Chronic pain syndrome    Diabetes ([place])    "borderline" (08/27/2013)   Failed back syndrome, cervical    GERD (gastroesophageal reflux disease)    Hypertension    no meds   Lumbar radiculopathy, chronic    Migraine    "once or twice/month now" (08/27/2013)   Neck pain    axial, s/p cervical fusion C5-6, C6-7    Obesity    Sickle cell trait ([facility])    Wears glasses    Past Surgical History:  Past Surgical History:  Procedure Laterality Date   ANTERIOR CERVICAL DECOMP/DISCECTOMY FUSION N/A 08/27/2013   Procedure: ANTERIOR CERVICAL /DISCECTOMY FUSION (ACDF) C5-C7  2 LEVELS;  Surgeon: Melina Schools, MD;  Location: [facility];  Service: Orthopedics;  Laterality: N/A;   BREAST REDUCTION SURGERY Bilateral 09/28/2014   Procedure: MAMMARY REDUCTION  (BREAST) BILATERAL;  Surgeon: Cristine Polio, MD;  Location: [facility];  Service: Plastics;  Laterality: Bilateral;   CHOLECYSTECTOMY  06/1989   DILATION AND CURETTAGE OF UTERUS     HERNIA REPAIR     KNEE ARTHROSCOPY Left 08/2003   LAPAROSCOPIC BILATERAL SALPINGECTOMY Bilateral 01/22/2015   Procedure: LAPAROSCOPIC BILATERAL SALPINGECTOMY;  Surgeon: Azucena Fallen, MD;  Location: [facility];  Service: Gynecology;  Laterality: Bilateral;   LEFT HEART CATHETERIZATION WITH CORONARY ANGIOGRAM N/A 04/15/2013   Procedure: LEFT  HEART CATHETERIZATION WITH CORONARY ANGIOGRAM;  Surgeon: Peter M Martinique, MD;  Location: [facility] C[facility] LAB;  Service: Cardiovascular;  Laterality: N/A;   LIPOSUCTION Bilateral 09/28/2014   Procedure: LIPOSUCTION;  Surgeon: Cristine Polio, MD;  Location: [facility];  Service: Plastics;  Laterality: Bilateral;   ROBOTIC ASSISTED TOTAL HYSTERECTOMY N/A 01/22/2015   has her ovaries---ROBOTIC ASSISTED TOTAL HYSTERECTOMY With Pelvic Washings, Lysis of Adhesions;  Surgeon: Azucena Fallen, MD;  Location: [facility];  Service: Gynecology;   Laterality: N/A;   SHOULDER ARTHROSCOPY W/ ROTATOR CUFF REPAIR Right 2006, 2007   TUBAL LIGATION  2706   UMBILICAL HERNIA REPAIR  2001, 2003   [place] EXTRACTION  1985    Social History:  reports that she has never smoked. She has never used smokeless tobacco. She reports that she does not drink alcohol and does not use drugs. Family History:  Family History  Problem Relation Age of Onset   Breast cancer Other         GM   Diabetes Father        F, B, S   CAD Father        died of MI @ 43   Kidney disease Father    Pancreatic cancer Other        GM   CAD Sister        MI at age 57   CAD Brother        CHF, died at age 58   Heart attack Brother        died suddenly @ 77   Leukemia Sister    Colon cancer Neg Hx      HOME MEDICATIONS: Allergies as of 11/03/2021       Reactions   Codeine    Facial rash , lip swelling    Vicodin [hydrocodone-acetaminophen] Swelling, Rash   Pt is able to take percocet without problems        Medication List        Accurate as of November 03, 2021  7:05 AM. If you have any questions, ask your nurse or doctor.          albuterol 108 (90 Base) MCG/ACT inhaler Commonly known as: Ventolin HFA Inhale 2 puffs into the lungs every 6 (six) hours as needed for wheezing or shortness of breath.   aspirin EC 81 MG tablet Take 81 mg by mouth daily.   atorvastatin 80 MG tablet Commonly known as: LIPITOR TAKE 1 TABLET BY MOUTH EVERY NIGHT AT BEDTIME   ezetimibe 10 MG tablet Commonly known as: Zetia Take 1 tablet (10 mg total) by mouth daily.   Farxiga 10 MG Tabs tablet Generic drug: dapagliflozin propanediol Take 1 tablet (10 mg total) by mouth daily before breakfast.   folic acid 1 MG tablet Commonly known as: FOLVITE Take 1 mg by mouth daily.   gabapentin 300 MG capsule Commonly known as: NEURONTIN Take 3 caps at bedtime What changed:  how much to take how to take this when to take this additional instructions    Insulin Pen Needle 31G X 5 MM Misc Inject 1 Device into the skin See admin instructions.   losartan-hydrochlorothiazide 100-25 MG tablet Commonly known as: HYZAAR Take 1 tablet by mouth daily.   Mucinex Nasal Spray Full Force 0.05 % nasal spray Generic drug: oxymetazoline Place 1 spray into both nostrils 2 (two) times daily as needed for congestion.   pantoprazole 20 MG tablet Commonly known as: PROTONIX Take 1 tablet (20  mg total) by mouth daily.   pantoprazole 20 MG tablet Commonly known as: PROTONIX Take 1 tablet (20 mg total) by mouth daily.   pioglitazone 30 MG tablet Commonly known as: Actos Take 1 tablet (30 mg total) by mouth daily.   SALINE NASAL MIST NA Place 1 spray into the nose as needed (congestion).   topiramate 50 MG tablet Commonly known as: TOPAMAX Take 1 tablet in AM, 2 tablets in PM What changed:  how much to take how to take this when to take this additional instructions   Tresiba FlexTouch 100 UNIT/ML FlexTouch Pen Generic drug: insulin degludec Inject 34 Units into the skin daily.   vitamin B-12 500 MCG tablet Commonly known as: CYANOCOBALAMIN Take 1,000 mcg by mouth daily.   vitamin C 100 MG tablet Take 100 mg by mouth 2 (two) times daily.   Vitamin D (Ergocalciferol) 1.25 MG (50000 UNIT) Caps capsule Commonly known as: DRISDOL Take 1 capsule (50,000 Units total) by mouth every 7 (seven) days.         ALLERGIES: Allergies  Allergen Reactions   Codeine     Facial rash , lip swelling    Vicodin [Hydrocodone-Acetaminophen] Swelling and Rash    Pt is able to take percocet without problems     REVIEW OF SYSTEMS: A comprehensive ROS was conducted with the patient and is negative except as per HPI and below:  Review of Systems  Gastrointestinal:  Positive for diarrhea and nausea.  Neurological:  Negative for tingling.  Endo/Heme/Allergies:  Positive for polydipsia.     OBJECTIVE:   VITAL SIGNS: LMP 01/16/2015    PHYSICAL  EXAM:  General: Pt appears well and is in NAD  Neck: General: Supple without adenopathy or carotid bruits. Thyroid: Thyroid size normal.  No goiter or nodules appreciated. No thyroid bruit.  Lungs: Clear with good BS bilat with no rales, rhonchi, or wheezes  Heart: RRR with normal S1 and S2 and no gallops; no murmurs; no rub  Abdomen: Normoactive bowel sounds, soft, nontender, without masses or organomegaly palpable  Extremities:  Lower extremities - No pretibial edema. No lesions.  Skin: Normal texture and temperature to palpation.  Neuro: MS is good with appropriate affect, pt is alert and Ox3    DM foot exam: 08/10/2020  The skin of the feet is intact without sores or ulcerations. The pedal pulses are 2+ on right and 2+ on left. The sensation is intact to a screening 5.07, 10 gram monofilament bilaterally   DATA REVIEWED:  Lab Results  Component Value Date   HGBA1C 13.1 (A) 06/14/2021   HGBA1C 11.6 (A) 08/10/2020   HGBA1C 10.2 (H) 05/28/2020    Latest Reference Range & Units 06/01/21 14:16  Sodium 135 - 146 mmol/L 135  Potassium 3.5 - 5.3 mmol/L 4.2  Chloride 98 - 110 mmol/L 98  CO2 20 - 32 mmol/L 25  Glucose 65 - 99 mg/dL 258 (H)  BUN 7 - 25 mg/dL 9  Creatinine 0.50 - 1.03 mg/dL 0.80  Calcium 8.6 - 10.4 mg/dL 9.5  BUN/Creatinine Ratio 6 - 22 (calc) NOT APPLICABLE    Latest Reference Range & Units 06/01/21 14:16  Total CHOL/HDL Ratio <5.0 (calc) 4.1  Cholesterol <200 mg/dL 259 (H)  HDL Cholesterol > OR = 50 mg/dL 63  LDL Cholesterol (Calc) mg/dL (calc) 169 (H)  Non-HDL Cholesterol (Calc) <130 mg/dL (calc) 196 (H)  Triglycerides <150 mg/dL 131    Latest Reference Range & Units 06/01/21 14:16  TSH mIU/L 1.56  In Office 257 mg/dL   ASSESSMENT / PLAN / RECOMMENDATIONS:   1) Type 2 Diabetes Mellitus, Poorly controlled, Without complications - Most recent A1c of 13.1 %. Goal A1c < 7.0%.    Poorly controlled due to medication non adherence and dietary  indiscretions  She declined adding prandial insulin in the past, we discussed this again today as her A1c has worsened from 11.6% to 13.1%  We opted in trying Pioglitazone , cautioned against weight gain and LE swelling  Will stop Metformin as she is unable to tolerate one tablet of Metformin XR   MEDICATIONS:  - Stop Metformin  - Start Actos 30 mg, HALF a tablet daily for 2 weeks, then increase to 1 tablet daily after that  - Increase Farxiga 10  mg, 1 tablet with Breakfast  - INcrease Tresiba to 34 units daily     EDUCATION / INSTRUCTIONS: BG monitoring instructions: Patient is instructed to check her blood sugars 2 times a day, fasting and bedtime  Call [facility] if: BG persistently < 70  I reviewed the Rule of 15 for the treatment of hypoglycemia in detail with the patient. Literature supplied.   2) Diabetic complications:  Eye: Does no have known diabetic retinopathy.  Neuro/ Feet: Does not have known diabetic peripheral neuropathy. Renal: Patient does not have known baseline CKD. She is on an ACEI/ARB at present.     3) Dyslipidemia :    - She was under the impression that Zetia replaced Atorvastatin but I explained to her that she needs to be on BOTH  - We discussed above goal LDL and her high risk for CVA and CAD   Medication  Continue Atorvastatin 80 mg daily  Continue Zetia 10 mg daily     F/U in 3 months     Signed electronically by: [name] Guise, MD  [facility] Endocrinology  [facility] [address], [place] [address] Phone: [phone] FAX: [phone]   CC: Colon Branch, [name] [place] [address] Phone: [phone]  Fax: [phone]    Return to Endocrinology clinic as below: Future Appointments  Date Time Provider [place]  11/03/2021  7:30 AM Hosam Mcfetridge, Melanie Crazier, MD [facility] None  11/24/2021  1:20 PM MK[facility]- MM 1 [facility]MM  [facility]  11/29/2021  9:00 AM Colon Branch, MD [facility]  01/19/2022  9:00 AM Camillia Herter, NP [facility] None

## 2021-11-24 ENCOUNTER — Ambulatory Visit: Payer: Medicaid Other

## 2021-11-29 ENCOUNTER — Telehealth (INDEPENDENT_AMBULATORY_CARE_PROVIDER_SITE_OTHER): Payer: 59 | Admitting: Internal Medicine

## 2021-11-29 ENCOUNTER — Encounter: Payer: Self-pay | Admitting: Internal Medicine

## 2021-11-29 VITALS — BP 129/89 | Ht 67.0 in | Wt 262.0 lb

## 2021-11-29 DIAGNOSIS — I10 Essential (primary) hypertension: Secondary | ICD-10-CM | POA: Diagnosis not present

## 2021-11-29 DIAGNOSIS — E785 Hyperlipidemia, unspecified: Secondary | ICD-10-CM

## 2021-11-29 DIAGNOSIS — E1165 Type 2 diabetes mellitus with hyperglycemia: Secondary | ICD-10-CM

## 2021-11-29 DIAGNOSIS — E559 Vitamin D deficiency, unspecified: Secondary | ICD-10-CM

## 2021-11-29 NOTE — Progress Notes (Signed)
Subjective:    Patient ID: [name], female    DOB: 1965/07/17, 56 y.o.   MRN: [id number]  DOS:  11/29/2021 Type of visit - description: Virtual Visit via Video Note  I connected with the above patient  by a video enabled telemedicine application and verified that I am speaking with the correct person using two identifiers.   Location of patient: home  Location of provider: office  Persons participating in the virtual visit: patient, provider   I discussed the limitations of evaluation and management by telemedicine and the availability of in person appointments. The patient expressed understanding and agreed to proceed.  Routine follow-up Since the last office visit is doing well. Today we talk about her chronic medical problems. Has stopped many of her medications.     Review of Systems See above   Past Medical History:  Diagnosis Date   Anxiety state, unspecified 05/16/2013   Arthritis    "right knee" (08/27/2013)   Asthma    "only when I get a sinus infection which is 1-2 X/yr" (08/27/2013)   Chronic pain syndrome    Diabetes ([place])    "borderline" (08/27/2013)   Failed back syndrome, cervical    GERD (gastroesophageal reflux disease)    Hypertension    no meds   Lumbar radiculopathy, chronic    Migraine    "once or twice/month now" (08/27/2013)   Neck pain    axial, s/p cervical fusion C5-6, C6-7    Obesity    Sickle cell trait ([place])    Wears glasses     Past Surgical History:  Procedure Laterality Date   ANTERIOR CERVICAL DECOMP/DISCECTOMY FUSION N/A 08/27/2013   Procedure: ANTERIOR CERVICAL /DISCECTOMY FUSION (ACDF) C5-C7  2 LEVELS;  Surgeon: Melina Schools, MD;  Location: [place];  Service: Orthopedics;  Laterality: N/A;   BREAST REDUCTION SURGERY Bilateral 09/28/2014   Procedure: MAMMARY REDUCTION  (BREAST) BILATERAL;  Surgeon: Cristine Polio, MD;  Location: [facility];  Service: Plastics;  Laterality: Bilateral;   CHOLECYSTECTOMY   06/1989   DILATION AND CURETTAGE OF UTERUS     HERNIA REPAIR     KNEE ARTHROSCOPY Left 08/2003   LAPAROSCOPIC BILATERAL SALPINGECTOMY Bilateral 01/22/2015   Procedure: LAPAROSCOPIC BILATERAL SALPINGECTOMY;  Surgeon: Azucena Fallen, MD;  Location: [facility];  Service: Gynecology;  Laterality: Bilateral;   LEFT HEART CATHETERIZATION WITH CORONARY ANGIOGRAM N/A 04/15/2013   Procedure: LEFT HEART CATHETERIZATION WITH CORONARY ANGIOGRAM;  Surgeon: Peter M Martinique, MD;  Location: [facility] CATH LAB;  Service: Cardiovascular;  Laterality: N/A;   LIPOSUCTION Bilateral 09/28/2014   Procedure: LIPOSUCTION;  Surgeon: Cristine Polio, MD;  Location: [facility];  Service: Plastics;  Laterality: Bilateral;   ROBOTIC ASSISTED TOTAL HYSTERECTOMY N/A 01/22/2015   has her ovaries---ROBOTIC ASSISTED TOTAL HYSTERECTOMY With Pelvic Washings, Lysis of Adhesions;  Surgeon: Azucena Fallen, MD;  Location: [facility];  Service: Gynecology;  Laterality: N/A;   SHOULDER ARTHROSCOPY W/ ROTATOR CUFF REPAIR Right 2006, 2007   TUBAL LIGATION  9357   UMBILICAL HERNIA REPAIR  2001, 2003   WISDOM TOOTH EXTRACTION  1985    Current Outpatient Medications  Medication Instructions   albuterol (VENTOLIN HFA) 108 (90 Base) MCG/ACT inhaler 2 puffs, Inhalation, Every 6 hours PRN   aspirin EC 81 mg, Oral, Daily   atorvastatin (LIPITOR) 80 MG tablet TAKE 1 TABLET BY MOUTH EVERY NIGHT AT BEDTIME   ezetimibe (ZETIA) 10 mg, Oral, Daily   folic acid (FOLVITE) 1 mg, Oral, Daily  Insulin Pen Needle 31G X 5 MM MISC 1 Device, Subcutaneous, See admin instructions   oxymetazoline (MUCINEX NASAL SPRAY FULL FORCE) 0.05 % nasal spray 1 spray, Each Nare, 2 times daily PRN   pantoprazole (PROTONIX) 20 mg, Oral, Daily   pioglitazone (ACTOS) 30 mg, Oral, Daily   SALINE NASAL MIST NA 1 spray, Nasal, As needed   Tresiba FlexTouch 34 Units, Subcutaneous, Daily   vitamin B-12 (CYANOCOBALAMIN) 1,000 mcg, Daily   vitamin C 100 mg, Oral, 2 times daily        Objective:   Physical Exam BP 129/89   Ht '5\' 7"'$  (1.702 m)   Wt 262 lb (118.8 kg)   LMP 01/16/2015   BMI 41.04 kg/m  This is a virtual video visit, alert oriented x3.  In no apparent distress.    Assessment     ASSESSMENT HTN dx 04-2017 DM, no neuropathy Dyslipidemia GERD Asthma-- associated with URIs-allergies  Anxiety Morbid Obesity MSK: Dr Erlinda Hong, Dr Letta Pate --DJD. Multiple surgeries.  --Chronic back pain, local injections back ( sees  Dr. Ella Bodo) --Neck pain-- used to see  Dr Rolena Infante, Dr Nelva Bush now seen at [facility] Migraine headaches -- on topamax, baclofen, per Dr Delice Lesch Sickle cell trait CP: cardiac cath 2014 -- normal + FH CAD   PLAN: HTN: Was on Hyzaar, took it for few months, self stopped due to leg cramps.  Ambulatory BPs mostly well controlled, occasional excursions to 140/100, typically related to stress, after she relaxes and recheck it is back down to the 130s/80s.  Check CMP We talk about the benefits of losartan without diuretics, she is hesitant.  We agreed to continue monitoring BPs and reassess in need of medication at the next time. DM: Currently on insulin and Actos.  Wilder Glade was too expensive, recommend to see Endo. Vitamin D deficiency, had ergocalciferol, currently on OTCs.  Check labs Dyslipidemia: Last LDL not well controlled, reports good compliance with Lipitor and Zetia.  Check FLP.   Migraines: Not taking Topamax, her current strategy is Excedrin prn, that is working well for patient. Anxiety: Reports is managing her stress a lot better. Preventive care: Declines COVID-vaccine RTC 6 months CPX     I discussed the assessment and treatment plan with the patient. The patient was provided an opportunity to ask questions and all were answered. The patient agreed with the plan and demonstrated an understanding of the instructions.   The patient was advised to call back or seek an in-person evaluation if the symptoms worsen or if the condition  fails to improve as anticipated.

## 2021-11-29 NOTE — Assessment & Plan Note (Signed)
HTN: Was on Hyzaar, took it for few months, self stopped due to leg cramps.  Ambulatory BPs mostly well controlled, occasional excursions to 140/100, typically related to stress, after she relaxes and recheck it is back down to the 130s/80s.  Check CMP We talk about the benefits of losartan without diuretics, she is hesitant.  We agreed to continue monitoring BPs and reassess in need of medication at the next time. DM: Currently on insulin and Actos.  Wilder Glade was too expensive, recommend to see Endo. Vitamin D deficiency, had ergocalciferol, currently on OTCs.  Check labs Dyslipidemia: Last LDL not well controlled, reports good compliance with Lipitor and Zetia.  Check FLP.   Migraines: Not taking Topamax, her current strategy is Excedrin prn, that is working well for patient. Anxiety: Reports is managing her stress a lot better. Preventive care: Declines COVID-vaccine RTC 6 months CPX

## 2021-12-01 ENCOUNTER — Other Ambulatory Visit (INDEPENDENT_AMBULATORY_CARE_PROVIDER_SITE_OTHER): Payer: 59

## 2021-12-01 ENCOUNTER — Ambulatory Visit: Payer: Medicaid Other

## 2021-12-01 DIAGNOSIS — E1165 Type 2 diabetes mellitus with hyperglycemia: Secondary | ICD-10-CM | POA: Diagnosis not present

## 2021-12-01 DIAGNOSIS — E785 Hyperlipidemia, unspecified: Secondary | ICD-10-CM | POA: Diagnosis not present

## 2021-12-01 DIAGNOSIS — E559 Vitamin D deficiency, unspecified: Secondary | ICD-10-CM

## 2021-12-01 DIAGNOSIS — I10 Essential (primary) hypertension: Secondary | ICD-10-CM | POA: Diagnosis not present

## 2021-12-01 LAB — COMPREHENSIVE METABOLIC PANEL
ALT: 11 U/L (ref 0–35)
AST: 11 U/L (ref 0–37)
Albumin: 3.9 g/dL (ref 3.5–5.2)
Alkaline Phosphatase: 73 U/L (ref 39–117)
BUN: 10 mg/dL (ref 6–23)
CO2: 29 mEq/L (ref 19–32)
Calcium: 9.6 mg/dL (ref 8.4–10.5)
Chloride: 102 mEq/L (ref 96–112)
Creatinine, Ser: 0.67 mg/dL (ref 0.40–1.20)
GFR: 98.51 mL/min (ref >60.00)
Glucose, Bld: 187 mg/dL — ABNORMAL HIGH (ref 70–99)
Potassium: 4 mEq/L (ref 3.5–5.1)
Sodium: 138 mEq/L (ref 135–145)
Total Bilirubin: 0.6 mg/dL (ref 0.2–1.2)
Total Protein: 7.1 g/dL (ref 6.0–8.3)

## 2021-12-01 LAB — LIPID PANEL
Cholesterol: 241 mg/dL — ABNORMAL HIGH (ref 0–200)
HDL: 57.4 mg/dL (ref >39.00)
LDL Cholesterol: 164 mg/dL — ABNORMAL HIGH (ref 0–99)
NonHDL: 183.27
Total CHOL/HDL Ratio: 4
Triglycerides: 96 mg/dL (ref 0.0–149.0)
VLDL: 19.2 mg/dL (ref 0.0–40.0)

## 2021-12-01 LAB — VITAMIN D 25 HYDROXY (VIT D DEFICIENCY, FRACTURES): VITD: 16.72 ng/mL — ABNORMAL LOW (ref 30.00–100.00)

## 2021-12-02 ENCOUNTER — Other Ambulatory Visit (HOSPITAL_BASED_OUTPATIENT_CLINIC_OR_DEPARTMENT_OTHER): Payer: Self-pay

## 2021-12-02 MED ORDER — VITAMIN D (ERGOCALCIFEROL) 1.25 MG (50000 UNIT) PO CAPS
50000.0000 [IU] | ORAL_CAPSULE | ORAL | 0 refills | Status: AC
  Filled 2021-12-02: qty 12, 84d supply, fill #0

## 2021-12-02 MED ORDER — ROSUVASTATIN CALCIUM 40 MG PO TABS
40.0000 mg | ORAL_TABLET | Freq: Every day | ORAL | 0 refills | Status: DC
  Filled 2021-12-02: qty 90, 90d supply, fill #0

## 2021-12-02 NOTE — Addendum Note (Signed)
Addended byDamita Dunnings D on: 12/02/2021 01:42 PM   Modules accepted: Orders
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
## 2022-01-19 ENCOUNTER — Other Ambulatory Visit (HOSPITAL_BASED_OUTPATIENT_CLINIC_OR_DEPARTMENT_OTHER): Payer: Self-pay

## 2022-01-19 ENCOUNTER — Encounter: Payer: Self-pay | Admitting: Family

## 2022-01-19 ENCOUNTER — Ambulatory Visit (INDEPENDENT_AMBULATORY_CARE_PROVIDER_SITE_OTHER): Payer: 59 | Admitting: Family

## 2022-01-19 VITALS — BP 171/100 | HR 80 | Temp 98.3°F | Resp 16 | Ht 64.92 in | Wt 266.0 lb

## 2022-01-19 DIAGNOSIS — I10 Essential (primary) hypertension: Secondary | ICD-10-CM

## 2022-01-19 DIAGNOSIS — Z7689 Persons encountering health services in other specified circumstances: Secondary | ICD-10-CM

## 2022-01-19 DIAGNOSIS — E785 Hyperlipidemia, unspecified: Secondary | ICD-10-CM

## 2022-01-19 DIAGNOSIS — F411 Generalized anxiety disorder: Secondary | ICD-10-CM | POA: Diagnosis not present

## 2022-01-19 DIAGNOSIS — E1165 Type 2 diabetes mellitus with hyperglycemia: Secondary | ICD-10-CM | POA: Diagnosis not present

## 2022-01-19 MED ORDER — EZETIMIBE 10 MG PO TABS
10.0000 mg | ORAL_TABLET | Freq: Every day | ORAL | 1 refills | Status: AC
  Filled 2022-01-19: qty 90, 90d supply, fill #0
  Filled 2022-04-14: qty 90, 90d supply, fill #1

## 2022-01-19 MED ORDER — ROSUVASTATIN CALCIUM 40 MG PO TABS
40.0000 mg | ORAL_TABLET | Freq: Every day | ORAL | 1 refills | Status: AC
Start: 2022-01-19 — End: 2022-07-18
  Filled 2022-01-19: qty 90, 90d supply, fill #0
  Filled 2022-04-14: qty 90, 90d supply, fill #1

## 2022-01-19 MED ORDER — VALSARTAN 40 MG PO TABS
40.0000 mg | ORAL_TABLET | Freq: Every day | ORAL | 0 refills | Status: DC
Start: 2022-01-19 — End: 2022-01-27
  Filled 2022-01-19: qty 30, 30d supply, fill #0

## 2022-01-19 NOTE — Patient Instructions (Signed)
Thank you for choosing Primary Care at [facility] for your medical home!    [name] was seen by Camillia Herter, NP today.   [name]'s primary care provider is Durene Fruits, NP.   For the best care possible,  you should try to see Durene Fruits, NP whenever you come to office.   We look forward to seeing you again soon!  If you have any questions about your visit today,  please call us at [phone]  Or feel free to reach your provider via [place].    Keeping you healthy   Get these tests Blood pressure- Have your blood pressure checked once a year by your healthcare provider.  Normal blood pressure is 120/80. Weight- Have your body mass index (BMI) calculated to screen for obesity.  BMI is a measure of body fat based on height and weight. You can also calculate your own BMI at [employer]velB[employer].it. Cholesterol- Have your cholesterol checked regularly starting at age 61, sooner may be necessary if you have diabetes, high blood pressure, if a family member developed heart diseases at an early age or if you smoke.  Chlamydia, HIV, and other sexual transmitted disease- Get screened each year until the age of 56 then within three months of each new sexual partner. Diabetes- Have your blood sugar checked regularly if you have high blood pressure, high cholesterol, a family history of diabetes or if you are overweight.   Get these vaccines Flu shot- Every fall. Tetanus shot- Every 10 years. Menactra- Single dose; prevents meningitis.   Take these steps Don't smoke- If you do smoke, ask your healthcare provider about quitting. For tips on how to quit, go to www.smokefree.gov or call 1-800-QUIT-NOW. Be physically active- Exercise 5 days a week for at least 30 minutes.  If you are not already physically active start slow and gradually work up to 30 minutes of moderate physical activity.  Examples of moderate activity include walking briskly, mowing the yard, dancing,  swimming bicycling, etc. Eat a healthy diet- Eat a variety of healthy foods such as fruits, vegetables, low fat milk, low fat cheese, yogurt, lean meats, poultry, fish, beans, tofu, etc.  For more information on healthy eating, go to www.thenutritionsource.org Drink alcohol in moderation- Limit alcohol intake two drinks or less a day.  Never drink and drive. Dentist- Brush and floss teeth twice daily; visit your dentis twice a year. Depression-Your emotional health is as important as your physical health.  If you're feeling down, losing interest in things you normally enjoy please talk with your healthcare provider. Gun Safety- If you keep a gun in your home, keep it unloaded and with the safety lock on.  Bullets should be stored separately. Helmet use- Always wear a helmet when riding a motorcycle, bicycle, rollerblading or skateboarding. Safe sex- If you may be exposed to a sexually transmitted infection, use a condom Seat belts- Seat bels can save your life; always wear one. Smoke/Carbon Monoxide detectors- These detectors need to be installed on the appropriate level of your home.  Replace batteries at least once a year. Skin Cancer- When out in the sun, cover up and use sunscreen SPF 15 or higher. Violence- If anyone is threatening or hurting you, please tell your healthcare provider.

## 2022-01-19 NOTE — Progress Notes (Signed)
Pt presents to establish care

## 2022-01-20 LAB — LIPID PANEL
Chol/HDL Ratio: 4.1 ratio (ref 0.0–4.4)
Cholesterol, Total: 235 mg/dL — ABNORMAL HIGH (ref 100–199)
HDL: 58 mg/dL (ref >39)
LDL Chol Calc (NIH): 158 mg/dL — ABNORMAL HIGH (ref 0–99)
Triglycerides: 107 mg/dL (ref 0–149)
VLDL Cholesterol Cal: 19 mg/dL (ref 5–40)

## 2022-01-24 NOTE — Progress Notes (Signed)
Patient ID: ROCIO ROAM, female    DOB: Apr 21, 1966  MRN: [id number]  CC: Blood Pressure Check  Subjective: [name] is a 56 y.o. female who presents for blood pressure check.   Her concerns today include:  - Doing well on current Valsartan without issues/concerns. Home blood pressures 140's-160's/90's-low 100's.  - Received call from Endocrinology referral. However, this was located in [place]. Prefers [place] office.    Patient Active Problem List   Diagnosis Date Noted   Unilateral primary osteoarthritis, right knee 03/02/2017   Impingement syndrome of left shoulder 06/13/2016   Sacroiliac dysfunction 03/28/2016   Dyslipidemia 10/18/2015   PCP NOTES >>>>>>>>>>> 04/01/2015   S/P laparoscopic hysterectomy 01/22/2015   Migraine with aura and without status migrainosus, not intractable 01/20/2015   Chronic low back pain 01/18/2015   Postlaminectomy syndrome, cervical region 01/18/2015   Lumbar facet arthropathy 01/18/2015   Lumbar degenerative disc disease 01/18/2015   Anxiety state 05/16/2013   Morbid obesity ([employer]) 04/15/2013   Diabetes ([place]) 02/14/2013   Mild anemia 02/14/2013   HTN (hypertension) 12/12/2012   Annual physical exam 12/12/2012   Intrinsic asthma 12/12/2012   GERD (gastroesophageal reflux disease)      Current Outpatient Medications on File Prior to Visit  Medication Sig Dispense Refill   Ascorbic Acid (VITAMIN C) 100 MG tablet Take 100 mg by mouth 2 (two) times daily.      aspirin EC 81 MG tablet Take 81 mg by mouth daily.     ezetimibe (ZETIA) 10 MG tablet Take 1 tablet (10 mg total) by mouth daily. 90 tablet 1   folic acid (FOLVITE) 1 MG tablet Take 1 mg by mouth daily.     insulin degludec (TRESIBA) 100 UNIT/ML FlexTouch Pen Inject 34 Units into the skin daily. 30 mL 2   Insulin Pen Needle 31G X 5 MM MISC Inject 1 Device into the skin See admin instructions. 100 each 2   oxymetazoline (MUCINEX NASAL SPRAY FULL FORCE) 0.05 % nasal spray  Place 1 spray into both nostrils 2 (two) times daily as needed for congestion.     pantoprazole (PROTONIX) 20 MG tablet Take 1 tablet (20 mg total) by mouth daily. 90 tablet 1   pioglitazone (ACTOS) 30 MG tablet Take 1 tablet (30 mg total) by mouth daily. 90 tablet 2   rosuvastatin (CRESTOR) 40 MG tablet Take 1 tablet (40 mg total) by mouth daily. 90 tablet 1   SALINE NASAL MIST NA Place 1 spray into the nose as needed (congestion).     vitamin B-12 (CYANOCOBALAMIN) 500 MCG tablet Take 1,000 mcg by mouth daily.      Vitamin D, Ergocalciferol, (DRISDOL) 1.25 MG (50000 UNIT) CAPS capsule Take 1 capsule (50,000 Units total) by mouth every 7 (seven) days. 12 capsule 0   No current facility-administered medications on file prior to visit.    Allergies  Allergen Reactions   Codeine     Facial rash , lip swelling    Vicodin [Hydrocodone-Acetaminophen] Swelling and Rash    Pt is able to take percocet without problems    Social History   Socioeconomic History   Marital status: Divorced    Spouse name: Not on file   Number of children: 2   Years of education: Not on file   Highest education level: Not on file  Occupational History   Occupation: works , Insurance underwriter: [employer]  Tobacco Use   Smoking status: Never  Passive exposure: Never   Smokeless tobacco: Never  Vaping Use   Vaping Use: Never used  Substance and Sexual Activity   Alcohol use: No    Alcohol/week: 0.0 standard drinks of alcohol   Drug use: No   Sexual activity: Not Currently    Birth control/protection: None  Other Topics Concern   Not on file  Social History Narrative   Lives w/ her mother   married 05-2017, divorced 2021    2 adult children   82 her 33 year old daughter March 13, 2020.       Social Determinants of Health   Financial Resource Strain: Not on file  Food Insecurity: Not on file  Transportation Needs: Not on file  Physical Activity: Not on file  Stress: Not on file  Social  Connections: Not on file  Intimate Partner Violence: Not on file    Family History  Problem Relation Age of Onset   Breast cancer Other         GM   Diabetes Father        F, B, S   CAD Father        died of MI @ 53   Kidney disease Father    Pancreatic cancer Other        GM   CAD Sister        MI at age 42   CAD Brother        CHF, died at age 64   Heart attack Brother        died suddenly @ 15   Leukemia Sister    Colon cancer Neg Hx     Past Surgical History:  Procedure Laterality Date   ANTERIOR CERVICAL DECOMP/DISCECTOMY FUSION N/A 08/27/2013   Procedure: ANTERIOR CERVICAL /DISCECTOMY FUSION (ACDF) C5-C7  2 LEVELS;  Surgeon: Melina Schools, MD;  Location: [facility];  Service: Orthopedics;  Laterality: N/A;   BREAST REDUCTION SURGERY Bilateral 09/28/2014   Procedure: MAMMARY REDUCTION  (BREAST) BILATERAL;  Surgeon: Cristine Polio, MD;  Location: [facility];  Service: Plastics;  Laterality: Bilateral;   CHOLECYSTECTOMY  06/1989   DILATION AND CURETTAGE OF UTERUS     HERNIA REPAIR     KNEE ARTHROSCOPY Left 08/2003   LAPAROSCOPIC BILATERAL SALPINGECTOMY Bilateral 01/22/2015   Procedure: LAPAROSCOPIC BILATERAL SALPINGECTOMY;  Surgeon: Azucena Fallen, MD;  Location: [facility];  Service: Gynecology;  Laterality: Bilateral;   LEFT HEART CATHETERIZATION WITH CORONARY ANGIOGRAM N/A 04/15/2013   Procedure: LEFT HEART CATHETERIZATION WITH CORONARY ANGIOGRAM;  Surgeon: Peter M Martinique, MD;  [facility] C[facility] LAB;  Service: Cardiovascular;  Laterality: N/A;   LIPOSUCTION Bilateral 09/28/2014   Procedure: LIPOSUCTION;  Surgeon: Cristine Polio, MD;  Location: [facility];  Service: Plastics;  Laterality: Bilateral;   ROBOTIC ASSISTED TOTAL HYSTERECTOMY N/A 01/22/2015   has her ovaries---ROBOTIC ASSISTED TOTAL HYSTERECTOMY With Pelvic Washings, Lysis of Adhesions;  Surgeon: Azucena Fallen, MD;  Location: [facility];  Service: Gynecology;  Laterality: N/A;   SHOULDER  ARTHROSCOPY W/ ROTATOR CUFF REPAIR Right 2006, 2007   TUBAL LIGATION  0762   UMBILICAL HERNIA REPAIR  2001, 2003   WISDOM TOOTH EXTRACTION  1985    ROS: Review of Systems Negative except as stated above  PHYSICAL EXAM: BP 138/83 (BP Location: Left Arm, Patient Position: Sitting, Cuff Size: Large)   Pulse 84   Temp 98.3 F (36.8 C)   Resp 18   Ht 5' 4.92" (1.649 m)   Wt 262 lb (118.8 kg)  LMP 01/16/2015   BMI 43.71 kg/m   Physical Exam HENT:     Head: Normocephalic and atraumatic.  Eyes:     Extraocular Movements: Extraocular movements intact.     Conjunctiva/sclera: Conjunctivae normal.     Pupils: Pupils are equal, round, and reactive to light.  Cardiovascular:     Rate and Rhythm: Normal rate and regular rhythm.     Pulses: Normal pulses.     Heart sounds: Normal heart sounds.  Pulmonary:     Effort: Pulmonary effort is normal.     Breath sounds: Normal breath sounds.  Musculoskeletal:     Cervical back: Normal range of motion and neck supple.  Neurological:     General: No focal deficit present.     Mental Status: She is alert and oriented to person, place, and time.  Psychiatric:        Mood and Affect: Mood normal.        Behavior: Behavior normal.    Results for orders placed or performed in visit on 01/27/22  POCT glycosylated hemoglobin (Hb A1C)  Result Value Ref Range   Hemoglobin A1C 13.0 (A) 4.0 - 5.6 %   HbA1c POC (<> result, manual entry)     HbA1c, POC (prediabetic range)     HbA1c, POC (controlled diabetic range)      ASSESSMENT AND PLAN: 1. Essential (primary) hypertension - Blood pressure much improved since last appointment. Suspect home blood pressure wrist monitor inaccurately calibrated.  - Continue Valsartan as prescribed.  - Counseled on blood pressure goal of less than 130/80, low-sodium, DASH diet, medication compliance, and 150 minutes of moderate intensity exercise per week as tolerated. Counseled on medication adherence and  adverse effects. - BMP to evaluate kidney function and electrolyte balance. - Follow-up with primary provider in 4 months or sooner if needed.  - Basic Metabolic Panel - valsartan (DIOVAN) 40 MG tablet; Take 1 tablet (40 mg total) by mouth daily.  Dispense: 30 tablet; Refill: 3  2. Type 2 diabetes mellitus with hyperglycemia, without long-term current use of insulin (HCC) - Hemoglobin A1c above goal 13.0%, goal < 7%. This is similar to 13.1% from December 2022.  - Patient reports since last appointment she received a call from Endocrinology referral. However, this was located in [place] and she prefers [place] office. New Endocrinology referral with patient location request ordered.  - Continue present management.  - POCT glycosylated hemoglobin (Hb A1C) - Ambulatory referral to Endocrinology    Patient was given the opportunity to ask questions.  Patient verbalized understanding of the plan and was able to repeat key elements of the plan. Patient was given clear instructions to go to Emergency Department or return to medical center if symptoms don't improve, worsen, or new problems develop.The patient verbalized understanding.   Orders Placed This Encounter  Procedures   Basic Metabolic Panel   Ambulatory referral to Endocrinology   POCT glycosylated hemoglobin (Hb A1C)     Requested Prescriptions   Signed Prescriptions Disp Refills   valsartan (DIOVAN) 40 MG tablet 30 tablet 3    Sig: Take 1 tablet (40 mg total) by mouth daily.    Return in about 4 months (around 05/30/2022) for Follow-Up or next available chronic care mgmt.  Camillia Herter, NP

## 2022-01-27 ENCOUNTER — Encounter: Payer: Self-pay | Admitting: Family

## 2022-01-27 ENCOUNTER — Ambulatory Visit (INDEPENDENT_AMBULATORY_CARE_PROVIDER_SITE_OTHER): Payer: 59 | Admitting: Family

## 2022-01-27 ENCOUNTER — Other Ambulatory Visit (HOSPITAL_BASED_OUTPATIENT_CLINIC_OR_DEPARTMENT_OTHER): Payer: Self-pay

## 2022-01-27 VITALS — BP 138/83 | HR 84 | Temp 98.3°F | Resp 18 | Ht 64.92 in | Wt 262.0 lb

## 2022-01-27 DIAGNOSIS — I10 Essential (primary) hypertension: Secondary | ICD-10-CM

## 2022-01-27 DIAGNOSIS — E1165 Type 2 diabetes mellitus with hyperglycemia: Secondary | ICD-10-CM | POA: Diagnosis not present

## 2022-01-27 LAB — POCT GLYCOSYLATED HEMOGLOBIN (HGB A1C): Hemoglobin A1C: 13 % — AB (ref 4.0–5.6)

## 2022-01-27 MED ORDER — VALSARTAN 40 MG PO TABS
40.0000 mg | ORAL_TABLET | Freq: Every day | ORAL | 3 refills | Status: AC
Start: 2022-01-27 — End: 2022-05-27
  Filled 2022-01-27 – 2022-04-14 (×2): qty 30, 30d supply, fill #0

## 2022-01-28 LAB — BASIC METABOLIC PANEL
BUN/Creatinine Ratio: 13 (ref 9–23)
BUN: 9 mg/dL (ref 6–24)
CO2: 23 mmol/L (ref 20–29)
Calcium: 9.5 mg/dL (ref 8.7–10.2)
Chloride: 102 mmol/L (ref 96–106)
Creatinine, Ser: 0.72 mg/dL (ref 0.57–1.00)
Glucose: 203 mg/dL — ABNORMAL HIGH (ref 70–99)
Potassium: 4.4 mmol/L (ref 3.5–5.2)
Sodium: 140 mmol/L (ref 134–144)
eGFR: 99 mL/min/{1.73_m2} (ref >59)

## 2022-04-14 ENCOUNTER — Other Ambulatory Visit: Payer: Self-pay | Admitting: Internal Medicine

## 2022-04-14 ENCOUNTER — Other Ambulatory Visit (HOSPITAL_BASED_OUTPATIENT_CLINIC_OR_DEPARTMENT_OTHER): Payer: Self-pay

## 2022-04-17 ENCOUNTER — Other Ambulatory Visit (HOSPITAL_BASED_OUTPATIENT_CLINIC_OR_DEPARTMENT_OTHER): Payer: Self-pay

## 2022-04-28 ENCOUNTER — Other Ambulatory Visit (HOSPITAL_BASED_OUTPATIENT_CLINIC_OR_DEPARTMENT_OTHER): Payer: Self-pay

## 2022-05-02 ENCOUNTER — Encounter: Payer: Self-pay | Admitting: Family

## 2022-05-02 ENCOUNTER — Other Ambulatory Visit: Payer: Self-pay | Admitting: Family

## 2022-05-02 DIAGNOSIS — E1165 Type 2 diabetes mellitus with hyperglycemia: Secondary | ICD-10-CM

## 2022-05-02 NOTE — Telephone Encounter (Signed)
Order complete. Confirm with patient which medications she needs refilled and notify me.

## 2022-05-03 ENCOUNTER — Other Ambulatory Visit: Payer: Self-pay | Admitting: Family

## 2022-05-03 DIAGNOSIS — E1165 Type 2 diabetes mellitus with hyperglycemia: Secondary | ICD-10-CM

## 2022-05-03 MED ORDER — INSULIN DEGLUDEC 100 UNIT/ML ~~LOC~~ SOPN
34.0000 [IU] | PEN_INJECTOR | Freq: Every day | SUBCUTANEOUS | 1 refills | Status: AC
  Filled 2022-05-03: qty 15, 44d supply, fill #0

## 2022-05-03 MED ORDER — INSULIN PEN NEEDLE 31G X 5 MM MISC
1.0000 | 0 refills | Status: AC
  Filled 2022-05-03: qty 100, 30d supply, fill #0

## 2022-05-03 NOTE — Telephone Encounter (Signed)
Order complete. 

## 2022-05-04 ENCOUNTER — Other Ambulatory Visit (HOSPITAL_BASED_OUTPATIENT_CLINIC_OR_DEPARTMENT_OTHER): Payer: Self-pay

## 2022-05-15 ENCOUNTER — Other Ambulatory Visit (HOSPITAL_BASED_OUTPATIENT_CLINIC_OR_DEPARTMENT_OTHER): Payer: Self-pay

## 2022-05-16 NOTE — Progress Notes (Deleted)
Patient ID: [name], female    DOB: 24-Mar-1966  MRN: [id number]  CC: Chronic Care Management  Subjective: Veneta Sliter is a 56 y.o. female who presents for chronic care management.  Her concerns today include:  HTN - Valsartan  - History of diuretics causing leg cramps. - Intolerant to Losartan which caused dry mouth  HLD - Crestor, Zetia  T2DM - Endo Intolerant to a couple DM meds  Patient Active Problem List   Diagnosis Date Noted   Unilateral primary osteoarthritis, right knee 03/02/2017   Impingement syndrome of left shoulder 06/13/2016   Sacroiliac dysfunction 03/28/2016   Dyslipidemia 10/18/2015   PCP NOTES >>>>>>>>>>> 04/01/2015   S/P laparoscopic hysterectomy 01/22/2015   Migraine with aura and without status migrainosus, not intractable 01/20/2015   Chronic low back pain 01/18/2015   Postlaminectomy syndrome, cervical region 01/18/2015   Lumbar facet arthropathy 01/18/2015   Lumbar degenerative disc disease 01/18/2015   Anxiety state 05/16/2013   Morbid obesity ([place]) 04/15/2013   Diabetes ([place]) 02/14/2013   Mild anemia 02/14/2013   HTN (hypertension) 12/12/2012   Annual physical exam 12/12/2012   Intrinsic asthma 12/12/2012   GERD (gastroesophageal reflux disease)      Current Outpatient Medications on File Prior to Visit  Medication Sig Dispense Refill   Ascorbic Acid (VITAMIN C) 100 MG tablet Take 100 mg by mouth 2 (two) times daily.      aspirin EC 81 MG tablet Take 81 mg by mouth daily.     ezetimibe (ZETIA) 10 MG tablet Take 1 tablet (10 mg total) by mouth daily. 90 tablet 1   folic acid (FOLVITE) 1 MG tablet Take 1 mg by mouth daily.     insulin degludec (TRESIBA) 100 UNIT/ML FlexTouch Pen Inject 34 Units into the skin daily. 15 mL 1   Insulin Pen Needle 31G X 5 MM MISC Use as directed to inject insulin 100 each 0   oxymetazoline (MUCINEX NASAL SPRAY FULL FORCE) 0.05 % nasal spray Place 1 spray into both nostrils 2 (two) times daily as needed  for congestion.     pantoprazole (PROTONIX) 20 MG tablet Take 1 tablet (20 mg total) by mouth daily. 90 tablet 1   pioglitazone (ACTOS) 30 MG tablet Take 1 tablet (30 mg total) by mouth daily. 90 tablet 2   rosuvastatin (CRESTOR) 40 MG tablet Take 1 tablet (40 mg total) by mouth daily. 90 tablet 1   SALINE NASAL MIST NA Place 1 spray into the nose as needed (congestion).     valsartan (DIOVAN) 40 MG tablet Take 1 tablet (40 mg total) by mouth daily. 30 tablet 3   vitamin B-12 (CYANOCOBALAMIN) 500 MCG tablet Take 1,000 mcg by mouth daily.      Vitamin D, Ergocalciferol, (DRISDOL) 1.25 MG (50000 UNIT) CAPS capsule Take 1 capsule (50,000 Units total) by mouth every 7 (seven) days. 12 capsule 0   No current facility-administered medications on file prior to visit.    Allergies  Allergen Reactions   Codeine     Facial rash , lip swelling    Vicodin [Hydrocodone-Acetaminophen] Swelling and Rash    Pt is able to take percocet without problems    Social History   Socioeconomic History   Marital status: Divorced    Spouse name: Not on file   Number of children: 2   Years of education: Not on file   Highest education level: Not on file  Occupational History   Occupation: works , debt  Therapist, art: [employer]  Tobacco Use   Smoking status: Never    Passive exposure: Never   Smokeless tobacco: Never  Vaping Use   Vaping Use: Never used  Substance and Sexual Activity   Alcohol use: No    Alcohol/week: 0.0 standard drinks of alcohol   Drug use: No   Sexual activity: Not Currently    Birth control/protection: None  Other Topics Concern   Not on file  Social History Narrative   Lives w/ her mother   married 05-2017, divorced 2021    2 adult children   18 her 5 year old daughter March 13, 2020.       Social Determinants of Health   Financial Resource Strain: Not on file  Food Insecurity: Not on file  Transportation Needs: Not on file  Physical Activity: Not on file   Stress: Not on file  Social Connections: Not on file  Intimate Partner Violence: Not on file    Family History  Problem Relation Age of Onset   Breast cancer Other         GM   Diabetes Father        F, B, S   CAD Father        died of MI @ 89   Kidney disease Father    Pancreatic cancer Other        GM   CAD Sister        MI at age 88   CAD Brother        CHF, died at age 80   Heart attack Brother        died suddenly @ 37   Leukemia Sister    Colon cancer Neg Hx     Past Surgical History:  Procedure Laterality Date   ANTERIOR CERVICAL DECOMP/DISCECTOMY FUSION N/A 08/27/2013   Procedure: ANTERIOR CERVICAL /DISCECTOMY FUSION (ACDF) C5-C7  2 LEVELS;  Surgeon: Melina Schools, MD;  Location: [facility];  Service: Orthopedics;  Laterality: N/A;   BREAST REDUCTION SURGERY Bilateral 09/28/2014   Procedure: MAMMARY REDUCTION  (BREAST) BILATERAL;  Surgeon: Cristine Polio, MD;  Location: [facility];  Service: Plastics;  Laterality: Bilateral;   CHOLECYSTECTOMY  06/1989   DILATION AND CURETTAGE OF UTERUS     HERNIA REPAIR     KNEE ARTHROSCOPY Left 08/2003   LAPAROSCOPIC BILATERAL SALPINGECTOMY Bilateral 01/22/2015   Procedure: LAPAROSCOPIC BILATERAL SALPINGECTOMY;  Surgeon: Azucena Fallen, MD;  Location: [facility];  Service: Gynecology;  Laterality: Bilateral;   LEFT HEART CATHETERIZATION WITH CORONARY ANGIOGRAM N/A 04/15/2013   Procedure: LEFT HEART CATHETERIZATION WITH CORONARY ANGIOGRAM;  Surgeon: Peter M Martinique, MD;  Location: [facility] CATH LAB;  Service: Cardiovascular;  Laterality: N/A;   LIPOSUCTION Bilateral 09/28/2014   Procedure: LIPOSUCTION;  Surgeon: Cristine Polio, MD;  Location: [facility];  Service: Plastics;  Laterality: Bilateral;   ROBOTIC ASSISTED TOTAL HYSTERECTOMY N/A 01/22/2015   has her ovaries---ROBOTIC ASSISTED TOTAL HYSTERECTOMY With Pelvic Washings, Lysis of Adhesions;  Surgeon: Azucena Fallen, MD;  Location: [facility];  Service: Gynecology;   Laterality: N/A;   SHOULDER ARTHROSCOPY W/ ROTATOR CUFF REPAIR Right 2006, 2007   TUBAL LIGATION  0938   UMBILICAL HERNIA REPAIR  2001, 2003   WISDOM TOOTH EXTRACTION  1985    ROS: Review of Systems Negative except as stated above  PHYSICAL EXAM: LMP 01/16/2015   Physical Exam  {female adult master:310786} {female adult master:310785}     Latest Ref Rng & Units 01/27/2022  2:15 PM 12/01/2021    8:54 AM 10/01/2021   10:22 PM  CMP  Glucose 70 - 99 mg/dL 203  187  224   BUN 6 - 24 mg/dL '9  10  20   '$ Creatinine 0.57 - 1.00 mg/dL 0.72  0.67  1.33   Sodium 134 - 144 mmol/L 140  138  138   Potassium 3.5 - 5.2 mmol/L 4.4  4.0  4.2   Chloride 96 - 106 mmol/L 102  102  101   CO2 20 - 29 mmol/L '23  29  27   '$ Calcium 8.7 - 10.2 mg/dL 9.5  9.6  9.4   Total Protein 6.0 - 8.3 g/dL  7.1    Total Bilirubin 0.2 - 1.2 mg/dL  0.6    Alkaline Phos 39 - 117 U/L  73    AST 0 - 37 U/L  11    ALT 0 - 35 U/L  11     Lipid Panel     Component Value Date/Time   CHOL 235 (H) 01/19/2022 1026   TRIG 107 01/19/2022 1026   HDL 58 01/19/2022 1026   CHOLHDL 4.1 01/19/2022 1026   CHOLHDL 4 12/01/2021 0854   VLDL 19.2 12/01/2021 0854   LDLCALC 158 (H) 01/19/2022 1026   LDLCALC 169 (H) 06/01/2021 1416    CBC    Component Value Date/Time   WBC 7.3 10/01/2021 2222   RBC 5.33 (H) 10/01/2021 2222   HGB 14.6 10/01/2021 2222   HCT 43.3 10/01/2021 2222   PLT 269 10/01/2021 2222   MCV 81.2 10/01/2021 2222   MCH 27.4 10/01/2021 2222   MCHC 33.7 10/01/2021 2222   RDW 13.4 10/01/2021 2222   LYMPHSABS 2,577 06/01/2021 1416   MONOABS 0.5 02/13/2020 1526   EOSABS 48 06/01/2021 1416   BASOSABS 41 06/01/2021 1416    ASSESSMENT AND PLAN:  There are no diagnoses linked to this encounter.   Patient was given the opportunity to ask questions.  Patient verbalized understanding of the plan and was able to repeat key elements of the plan. Patient was given clear instructions to go to Emergency Department or  return to medical center if symptoms don't improve, worsen, or new problems develop.The patient verbalized understanding.   No orders of the defined types were placed in this encounter.    Requested Prescriptions    No prescriptions requested or ordered in this encounter    No follow-ups on file.  Camillia Herter, NP

## 2022-05-26 ENCOUNTER — Ambulatory Visit: Payer: 59 | Admitting: Family

## 2022-05-26 DIAGNOSIS — E785 Hyperlipidemia, unspecified: Secondary | ICD-10-CM

## 2022-05-26 DIAGNOSIS — I10 Essential (primary) hypertension: Secondary | ICD-10-CM

## 2022-08-07 ENCOUNTER — Other Ambulatory Visit: Payer: Self-pay | Admitting: Family

## 2022-08-07 DIAGNOSIS — Z1231 Encounter for screening mammogram for malignant neoplasm of breast: Secondary | ICD-10-CM

## 2022-08-31 ENCOUNTER — Ambulatory Visit
Admission: RE | Admit: 2022-08-31 | Discharge: 2022-08-31 | Disposition: A | Discharge status: Home / Self Care | Payer: Self-pay | Source: Ambulatory Visit | Attending: Family | Admitting: Family

## 2022-08-31 DIAGNOSIS — Z1231 Encounter for screening mammogram for malignant neoplasm of breast: Secondary | ICD-10-CM

## 2022-09-08 ENCOUNTER — Encounter (HOSPITAL_COMMUNITY): Payer: Self-pay

## 2022-09-08 ENCOUNTER — Ambulatory Visit (HOSPITAL_COMMUNITY): Payer: Self-pay

## 2022-09-08 ENCOUNTER — Ambulatory Visit (HOSPITAL_COMMUNITY)
Admission: RE | Admit: 2022-09-08 | Discharge: 2022-09-08 | Disposition: A | Discharge status: Home / Self Care | Payer: Self-pay | Source: Ambulatory Visit | Attending: Emergency Medicine | Admitting: Emergency Medicine

## 2022-09-08 ENCOUNTER — Ambulatory Visit (INDEPENDENT_AMBULATORY_CARE_PROVIDER_SITE_OTHER): Payer: Self-pay

## 2022-09-08 VITALS — BP 151/92 | HR 82 | Temp 98.3°F | Resp 17

## 2022-09-08 DIAGNOSIS — M25562 Pain in left knee: Secondary | ICD-10-CM

## 2022-09-08 NOTE — ED Provider Notes (Signed)
[facility]    CSN: [id number] Arrival date & time: 09/08/22  1413      History   Chief Complaint Chief Complaint  Patient presents with   appt 230   Knee Pain    HPI [name] is a 57 y.o. female.   Reports bumping her left knee on the desk 4 weeks ago, over the past 3 weeks she has had knee pain and discomfort, pain with prolonged standing. Has been heating and icing without much relief. Is ambulatory with pain. Pain, swelling and warmth to her anterior and posterior knee, extending up to mid-thigh and mid-calf. Denies cough or SOB.   The history is provided by the patient.  Knee Pain Associated symptoms: no back pain and no fever     Past Medical History:  Diagnosis Date   Anxiety state, unspecified 05/16/2013   Arthritis    "right knee" (08/27/2013)   Asthma    "only when I get a sinus infection which is 1-2 X/yr" (08/27/2013)   Chronic pain syndrome    Diabetes ([place])    "borderline" (08/27/2013)   Failed back syndrome, cervical    GERD (gastroesophageal reflux disease)    Hypertension    no meds   Lumbar radiculopathy, chronic    Migraine    "once or twice/month now" (08/27/2013)   Neck pain    axial, s/p cervical fusion C5-6, C6-7    Obesity    Sickle cell trait ([place])    Wears glasses     Patient Active Problem List   Diagnosis Date Noted   Unilateral primary osteoarthritis, right knee 03/02/2017   Impingement syndrome of left shoulder 06/13/2016   Sacroiliac dysfunction 03/28/2016   Dyslipidemia 10/18/2015   PCP NOTES >>>>>>>>>>> 04/01/2015   S/P laparoscopic hysterectomy 01/22/2015   Migraine with aura and without status migrainosus, not intractable 01/20/2015   Chronic low back pain 01/18/2015   Postlaminectomy syndrome, cervical region 01/18/2015   Lumbar facet arthropathy 01/18/2015   Lumbar degenerative disc disease 01/18/2015   Anxiety state 05/16/2013   Morbid obesity ([place]) 04/15/2013   Diabetes ([place]) 02/14/2013   Mild anemia  02/14/2013   HTN (hypertension) 12/12/2012   Annual physical exam 12/12/2012   Intrinsic asthma 12/12/2012   GERD (gastroesophageal reflux disease)     Past Surgical History:  Procedure Laterality Date   ANTERIOR CERVICAL DECOMP/DISCECTOMY FUSION N/A 08/27/2013   Procedure: ANTERIOR CERVICAL /DISCECTOMY FUSION (ACDF) C5-C7  2 LEVELS;  Surgeon: Melina Schools, MD;  Location: [facility];  Service: Orthopedics;  Laterality: N/A;   BREAST REDUCTION SURGERY Bilateral 09/28/2014   Procedure: MAMMARY REDUCTION  (BREAST) BILATERAL;  Surgeon: Cristine Polio, MD;  Location: [facility];  Service: Plastics;  Laterality: Bilateral;   CHOLECYSTECTOMY  06/1989   DILATION AND CURETTAGE OF UTERUS     HERNIA REPAIR     KNEE ARTHROSCOPY Left 08/2003   LAPAROSCOPIC BILATERAL SALPINGECTOMY Bilateral 01/22/2015   Procedure: LAPAROSCOPIC BILATERAL SALPINGECTOMY;  Surgeon: Azucena Fallen, MD;  Location: [facility];  Service: Gynecology;  Laterality: Bilateral;   LEFT HEART CATHETERIZATION WITH CORONARY ANGIOGRAM N/A 04/15/2013   Procedure: LEFT HEART CATHETERIZATION WITH CORONARY ANGIOGRAM;  Surgeon: Peter M Martinique, MD;  [facility] C[facility] LAB;  Service: Cardiovascular;  Laterality: N/A;   LIPOSUCTION Bilateral 09/28/2014   Procedure: LIPOSUCTION;  Surgeon: Cristine Polio, MD;  Location: [facility];  Service: Plastics;  Laterality: Bilateral;   REDUCTION MAMMAPLASTY  2016   ROBOTIC ASSISTED TOTAL HYSTERECTOMY N/A 01/22/2015  has her ovaries---ROBOTIC ASSISTED TOTAL HYSTERECTOMY With Pelvic Washings, Lysis of Adhesions;  Surgeon: Azucena Fallen, MD;  Location: [facility];  Service: Gynecology;  Laterality: N/A;   SHOULDER ARTHROSCOPY W/ ROTATOR CUFF REPAIR Right 2006, 2007   TUBAL LIGATION  99991111   UMBILICAL HERNIA REPAIR  2001, 2003   [place] EXTRACTION  1985    OB History   No obstetric history on file.      Home Medications    Prior to Admission medications   Medication Sig  Start Date End Date Taking? Authorizing Provider  Ascorbic Acid (VITAMIN C) 100 MG tablet Take 100 mg by mouth 2 (two) times daily.     [provider]  aspirin EC 81 MG tablet Take 81 mg by mouth daily.    [provider]  ezetimibe (ZETIA) 10 MG tablet Take 1 tablet (10 mg total) by mouth daily. 01/19/22 07/18/22  Camillia Herter, NP  folic acid (FOLVITE) 1 MG tablet Take 1 mg by mouth daily.    [provider]  insulin degludec (TRESIBA) 100 UNIT/ML FlexTouch Pen Inject 34 Units into the skin daily. 05/03/22   Camillia Herter, NP  Insulin Pen Needle 31G X 5 MM MISC Use as directed to inject insulin 05/03/22   Camillia Herter, NP  oxymetazoline [facility] NASAL SPRAY FULL FORCE) 0.05 % nasal spray Place 1 spray into both nostrils 2 (two) times daily as needed for congestion.    [provider]  pantoprazole (PROTONIX) 20 MG tablet Take 1 tablet (20 mg total) by mouth daily. 06/08/21   Colon Branch, MD  pioglitazone (ACTOS) 30 MG tablet Take 1 tablet (30 mg total) by mouth daily. 06/14/21   Shamleffer, Melanie Crazier, MD  rosuvastatin (CRESTOR) 40 MG tablet Take 1 tablet (40 mg total) by mouth daily. 01/19/22 07/18/22  Camillia Herter, NP  SALINE NASAL MIST NA Place 1 spray into the nose as needed (congestion).    [provider]  valsartan (DIOVAN) 40 MG tablet Take 1 tablet (40 mg total) by mouth daily. 01/27/22 05/27/22  Camillia Herter, NP  vitamin B-12 (CYANOCOBALAMIN) 500 MCG tablet Take 1,000 mcg by mouth daily.     [provider]  Vitamin D, Ergocalciferol, (DRISDOL) 1.25 MG (50000 UNIT) CAPS capsule Take 1 capsule (50,000 Units total) by mouth every 7 (seven) days. 12/02/21   Colon Branch, MD    Family History Family History  Problem Relation Age of Onset   Diabetes Father        F, B, S   CAD Father        died of MI @ 6   Kidney disease Father    CAD Sister        MI at age 42   Leukemia Sister    Breast cancer Paternal 65     CAD Brother        CHF, died at age 14   Heart attack Brother        died suddenly @ 63   Breast cancer Other         GM   Pancreatic cancer Other        GM   Colon cancer Neg Hx     Social History Social History   Tobacco Use   Smoking status: Never    Passive exposure: Never   Smokeless tobacco: Never  Vaping Use   Vaping Use: Never used  Substance Use Topics   Alcohol use:  No    Alcohol/week: 0.0 standard drinks of alcohol   Drug use: No     Allergies   Codeine and Vicodin [hydrocodone-acetaminophen]   Review of Systems Review of Systems  Constitutional:  Negative for chills and fever.  HENT:  Negative for ear pain and sore throat.   Eyes:  Negative for pain and visual disturbance.  Respiratory:  Negative for cough and shortness of breath.   Cardiovascular:  Positive for leg swelling. Negative for chest pain and palpitations.  Gastrointestinal:  Negative for abdominal pain and vomiting.  Genitourinary:  Negative for dysuria and hematuria.  Musculoskeletal:  Positive for gait problem and joint swelling. Negative for arthralgias and back pain.  Skin:  Negative for color change and rash.  Neurological:  Negative for seizures and syncope.  All other systems reviewed and are negative.    Physical Exam Triage Vital Signs ED Triage Vitals  Enc Vitals Group     BP 09/08/22 1432 (!) 151/92     Pulse Rate 09/08/22 1432 82     Resp 09/08/22 1432 17     Temp 09/08/22 1432 98.3 F (36.8 C)     Temp Source 09/08/22 1432 Oral     SpO2 09/08/22 1432 90 %     Weight --      Height --      Head Circumference --      Peak Flow --      Pain Score 09/08/22 1430 7     Pain Loc --      Pain Edu? --      Excl. in [place]? --    No data found.  Updated Vital Signs BP (!) 151/92 (BP Location: Left Arm)   Pulse 82   Temp 98.3 F (36.8 C) (Oral)   Resp 17   LMP 01/16/2015   SpO2 90%   Visual Acuity Right Eye Distance:   Left Eye Distance:   Bilateral Distance:     Right Eye Near:   Left Eye Near:    Bilateral Near:     Physical Exam Vitals and nursing note reviewed.  Constitutional:      General: She is not in acute distress.    Appearance: Normal appearance. She is well-developed.     Comments: Pleasant 57 year old female who appears stated age.  HENT:     Head: Normocephalic and atraumatic.     Right Ear: External ear normal.     Left Ear: External ear normal.  Eyes:     Conjunctiva/sclera: Conjunctivae normal.  Cardiovascular:     Rate and Rhythm: Normal rate and regular rhythm.     Heart sounds: Normal heart sounds, S1 normal and S2 normal. No murmur heard. Pulmonary:     Effort: Pulmonary effort is normal. No respiratory distress.     Breath sounds: Normal breath sounds.     Comments: Lungs vesicular posteriorly. Musculoskeletal:        General: Swelling, tenderness and signs of injury present. Normal range of motion.     Cervical back: Normal range of motion and neck supple.     Right knee: No swelling or erythema. No tenderness.     Left knee: Swelling and erythema present. Tenderness present.     Comments: Left-sided anterior and posterior patella tenderness that extends to mid thigh and mid calf.  Erythema and edema to these areas as well.  Skin:    General: Skin is warm and dry.     Capillary Refill: Capillary refill takes  less than 2 seconds.  Neurological:     Mental Status: She is alert.  Psychiatric:        Mood and Affect: Mood normal.        Behavior: Behavior is cooperative.      UC Treatments / Results  Labs (all labs ordered are listed, but only abnormal results are displayed) Labs Reviewed - No data to display  EKG   Radiology DG Knee Complete 4 Views Left  Result Date: 09/08/2022 CLINICAL DATA:  Pain and swelling EXAM: LEFT KNEE - COMPLETE 4+ VIEW COMPARISON:  06/24/2009 FINDINGS: No recent fracture or dislocation is seen. There is soft tissue fullness in suprapatellar bursa. Degenerative changes are  noted with bony spurs in medial, lateral and patellofemoral compartments with interval progression. No focal lytic lesions are seen. IMPRESSION: No recent fracture or dislocation is seen. Small effusion is present in suprapatellar bursa. Degenerative changes are noted with bony spurs in medial, lateral and patellofemoral compartments with interval progression. Electronically Signed   By: Elmer Picker M.D.   On: 09/08/2022 15:37    Procedures Procedures (including critical care time)  Medications Ordered in UC Medications - No data to display  Initial Impression / Assessment and Plan / UC Course  I have reviewed the triage vital signs and the nursing notes.  Pertinent labs & imaging results that were available during my care of the patient were reviewed by me and considered in my medical decision making (see chart for details).  Vitals and triage note reviewed. She is afebrile.  Is 90% on room air, difficulty obtaining oxygenation status due to artificial nails.  Denies shortness of breath, lungs vesicular posteriorly.  Left knee x-ray shows no fracture or dislocation is seen. Small effusion is present in suprapatellar bursa, agreed with radiologist interpretation.  Due to continued pain despite conservative treatment, swelling and warmth, feel as if patient would benefit from further treatment and work-up. Concern for DVT, no access to [place] currently.  Patient advised to head to the nearest emergency room, verbalized understanding.     Final Clinical Impressions(s) / UC Diagnoses   Final diagnoses:  Acute pain of left knee     Discharge Instructions      Your x-ray was negative for fracture or dislocation.  You did have a small effusion.  Unfortunately this does not fully explain your symptoms of edema, erythema and continued pain.  I advise you to head to the nearest emergency room for further workup and evaluation.     ED Prescriptions   None    I have reviewed the PDMP  during this encounter.   Shawnika Pepin, [place] N, [place] 09/08/22 [phone]

## 2022-09-08 NOTE — ED Triage Notes (Signed)
Pt reports for about 3 weeks having left knee pains and swelling. Pain radiates from mid calf up thigh. Using heat and ice  Reports hit knee on desk at work about month ago.

## 2022-09-08 NOTE — Discharge Instructions (Signed)
Your x-ray was negative for fracture or dislocation.  You did have a small effusion.  Unfortunately this does not fully explain your symptoms of edema, erythema and continued pain.  I advise you to head to the nearest emergency room for further workup and evaluation.

## 2022-09-09 ENCOUNTER — Emergency Department (HOSPITAL_COMMUNITY)
Admission: EM | Admit: 2022-09-09 | Discharge: 2022-09-09 | Disposition: A | Discharge status: Home / Self Care | Payer: Self-pay | Attending: Emergency Medicine | Admitting: Emergency Medicine

## 2022-09-09 ENCOUNTER — Other Ambulatory Visit: Payer: Self-pay

## 2022-09-09 ENCOUNTER — Emergency Department (HOSPITAL_BASED_OUTPATIENT_CLINIC_OR_DEPARTMENT_OTHER): Payer: Self-pay

## 2022-09-09 ENCOUNTER — Encounter (HOSPITAL_COMMUNITY): Payer: Self-pay | Admitting: *Deleted

## 2022-09-09 DIAGNOSIS — M25562 Pain in left knee: Secondary | ICD-10-CM

## 2022-09-09 DIAGNOSIS — Z7982 Long term (current) use of aspirin: Secondary | ICD-10-CM | POA: Insufficient documentation

## 2022-09-09 DIAGNOSIS — I10 Essential (primary) hypertension: Secondary | ICD-10-CM

## 2022-09-09 DIAGNOSIS — M7042 Prepatellar bursitis, left knee: Secondary | ICD-10-CM | POA: Insufficient documentation

## 2022-09-09 DIAGNOSIS — M7052 Other bursitis of knee, left knee: Secondary | ICD-10-CM

## 2022-09-09 DIAGNOSIS — Y9389 Activity, other specified: Secondary | ICD-10-CM | POA: Insufficient documentation

## 2022-09-09 DIAGNOSIS — Z79899 Other long term (current) drug therapy: Secondary | ICD-10-CM | POA: Insufficient documentation

## 2022-09-09 MED ORDER — CEPHALEXIN 250 MG PO CAPS
500.0000 mg | ORAL_CAPSULE | Freq: Once | ORAL | Status: AC
  Administered 2022-09-09: 500 mg via ORAL
  Filled 2022-09-09: qty 2

## 2022-09-09 MED ORDER — IBUPROFEN 400 MG PO TABS
400.0000 mg | ORAL_TABLET | Freq: Once | ORAL | Status: AC
Start: 2022-09-09 — End: 2022-09-09
  Administered 2022-09-09: 400 mg via ORAL
  Filled 2022-09-09: qty 1

## 2022-09-09 MED ORDER — CEPHALEXIN 500 MG PO CAPS: 500.0000 mg | ORAL_CAPSULE | Freq: Four times a day (QID) | ORAL | 0 refills | Status: AC

## 2022-09-09 NOTE — Progress Notes (Signed)
LLE venous duplex has been completed.  Preliminary results given to Dr, Ashok Cordia.   Results can be found under chart review under CV PROC. 09/09/2022 12:44 PM Abhimanyu Cruces RVT, RDMS

## 2022-09-09 NOTE — Discharge Instructions (Addendum)
It was our pleasure to provide your ER care today - we hope that you feel better.  Take acetaminophen or ibuprofen as need. Take keflex (antibiotic) as prescribed.   Follow up with primary care doctor/orthopedist in 1-2 weeks if symptoms fail to improve/resolve. Also follow up closely with primary care doctor regarding your blood pressure that is high today.  Return to ER if worse, new symptoms, fevers, increased swelling/redness, severe pain, or other emergency concern.

## 2022-09-09 NOTE — ED Provider Notes (Signed)
[facility] Provider Note   CSN: [id number] Arrival date & time: 09/09/22  0750     History  Chief Complaint  Patient presents with   Knee Pain    [name] is a 57 y.o. female.  Patient c/o left knee pain anteriorly in the past few weeks. States initially had hit the knee on desk at work. No lacs or abrasions to skin then. Since then has developed pain and mild swelling about left knee. Denies prior severe knee arthritis or chronic/recurrent pain. No other current joint pain or swelling. No fever or chills. Went to urgent care and was told to go to ED. Denies lower leg pain or swelling. No hx dvt or pe. No cp or sob.   The history is provided by the patient and medical records.  Knee Pain Associated symptoms: no fever        Home Medications Prior to Admission medications   Medication Sig Start Date End Date Taking? Authorizing Provider  cephALEXin (KEFLEX) 500 MG capsule Take 1 capsule (500 mg total) by mouth 4 (four) times daily. 09/09/22  Yes Lajean Saver, MD  Ascorbic Acid (VITAMIN C) 100 MG tablet Take 100 mg by mouth 2 (two) times daily.     [provider]  aspirin EC 81 MG tablet Take 81 mg by mouth daily.    [provider]  ezetimibe (ZETIA) 10 MG tablet Take 1 tablet (10 mg total) by mouth daily. 01/19/22 07/18/22  Camillia Herter, NP  folic acid (FOLVITE) 1 MG tablet Take 1 mg by mouth daily.    [provider]  insulin degludec (TRESIBA) 100 UNIT/ML FlexTouch Pen Inject 34 Units into the skin daily. 05/03/22   Camillia Herter, NP  Insulin Pen Needle 31G X 5 MM MISC Use as directed to inject insulin 05/03/22   Camillia Herter, NP  oxymetazoline [facility] NASAL SPRAY FULL FORCE) 0.05 % nasal spray Place 1 spray into both nostrils 2 (two) times daily as needed for congestion.    [provider]  pantoprazole (PROTONIX) 20 MG tablet Take 1 tablet (20 mg total) by mouth daily. 06/08/21   Colon Branch, MD  pioglitazone (ACTOS) 30 MG tablet Take 1 tablet (30 mg total) by mouth daily. 06/14/21   Shamleffer, Melanie Crazier, MD  rosuvastatin (CRESTOR) 40 MG tablet Take 1 tablet (40 mg total) by mouth daily. 01/19/22 07/18/22  Camillia Herter, NP  SALINE NASAL MIST NA Place 1 spray into the nose as needed (congestion).    [provider]  valsartan (DIOVAN) 40 MG tablet Take 1 tablet (40 mg total) by mouth daily. 01/27/22 05/27/22  Camillia Herter, NP  vitamin B-12 (CYANOCOBALAMIN) 500 MCG tablet Take 1,000 mcg by mouth daily.     [provider]  Vitamin D, Ergocalciferol, (DRISDOL) 1.25 MG (50000 UNIT) CAPS capsule Take 1 capsule (50,000 Units total) by mouth every 7 (seven) days. 12/02/21   Colon Branch, MD      Allergies    Codeine and Vicodin [hydrocodone-acetaminophen]    Review of Systems   Review of Systems  Constitutional:  Negative for chills and fever.  Respiratory:  Negative for shortness of breath.   Cardiovascular:  Negative for chest pain.  Musculoskeletal:        Left knee pain  Skin:  Negative for rash and wound.  Neurological:  Negative for weakness and numbness.    Physical Exam Updated Vital  Signs BP (!) 171/111   Pulse 90   Temp 98 F (36.7 C)   Resp 17   Ht 1.676 m ('5\' 6"'$ )   Wt 117.9 kg   LMP 01/16/2015   SpO2 99%   BMI 41.97 kg/m  Physical Exam Vitals and nursing note reviewed.  Constitutional:      Appearance: Normal appearance. She is well-developed.  HENT:     Head: Atraumatic.     Nose: Nose normal.     Mouth/Throat:     Mouth: Mucous membranes are moist.  Eyes:     General: No scleral icterus.    Conjunctiva/sclera: Conjunctivae normal.  Neck:     Trachea: No tracheal deviation.  Cardiovascular:     Rate and Rhythm: Normal rate and regular rhythm.     Pulses: Normal pulses.     Heart sounds: Normal heart sounds. No murmur heard.    No gallop.  Pulmonary:     Effort: Pulmonary effort is normal. No respiratory  distress.     Breath sounds: Normal breath sounds.  Abdominal:     General: There is no distension.  Musculoskeletal:     Cervical back: Neck supple. No muscular tenderness.     Comments: Mild tenderness to left knee anteriorly, including just superior to patella. Knee appears grossly stable. Good passive rom at hip and knee without significant pain or discomfort. No gross LLE edema. Distal pulses palp.  ?slightly increased warmth to suprapatellar area. No skin changes. No crepitus.   Skin:    General: Skin is warm and dry.     Findings: No rash.  Neurological:     Mental Status: She is alert.     Comments: Alert, speech normal. LLE nvi with intact motor/sens fxn.   Psychiatric:        Mood and Affect: Mood normal.     ED Results / Procedures / Treatments   Labs (all labs ordered are listed, but only abnormal results are displayed) Labs Reviewed - No data to display  EKG None  Radiology DG Knee Complete 4 Views Left  Result Date: 09/08/2022 CLINICAL DATA:  Pain and swelling EXAM: LEFT KNEE - COMPLETE 4+ VIEW COMPARISON:  06/24/2009 FINDINGS: No recent fracture or dislocation is seen. There is soft tissue fullness in suprapatellar bursa. Degenerative changes are noted with bony spurs in medial, lateral and patellofemoral compartments with interval progression. No focal lytic lesions are seen. IMPRESSION: No recent fracture or dislocation is seen. Small effusion is present in suprapatellar bursa. Degenerative changes are noted with bony spurs in medial, lateral and patellofemoral compartments with interval progression. Electronically Signed   By: Elmer Picker M.D.   On: 09/08/2022 15:37    Procedures Procedures    Medications Ordered in ED Medications  ibuprofen (ADVIL) tablet 400 mg (400 mg Oral Given 09/09/22 0852)  cephALEXin (KEFLEX) capsule 500 mg (500 mg Oral Given 09/09/22 [id number])    ED Course/ Medical Decision Making/ A&P                             Medical Decision  Making Problems Addressed: Acute pain of left knee: acute illness or injury Essential hypertension: chronic illness or injury with exacerbation, progression, or side effects of treatment that poses a threat to life or bodily functions Suprapatellar bursitis of left knee: acute illness or injury with systemic symptoms  Amount and/or Complexity of Data Reviewed External Data Reviewed: radiology and notes. Radiology: ordered  and independent interpretation performed. Decision-making details documented in ED Course.  Risk Prescription drug management.   Reviewed nursing notes and prior charts for additional history.  Indicates pt told to go to ED for  eval and u/s.   Hx/symptoms/exam appears most c/w bursitis, ?post traumatic bursitis.   Ibuprofen po. Keflex po.  Imaging reviewed/interpreted by me - no fx.   Vascular imaging reviewed/interpreted by me - no dvt. Vascular tech also indicates no dvt.  Pcp/ortho f/u recommended.also rec close pcp f/u re bp that is high, 160/93, hx htn.   Return precautions provided.           Final Clinical Impression(s) / ED Diagnoses Final diagnoses:  Acute pain of left knee  Suprapatellar bursitis of left knee  Essential hypertension    Rx / DC Orders ED Discharge Orders          Ordered    cephALEXin (KEFLEX) 500 MG capsule  4 times daily        09/09/22 0925              Lajean Saver, MD 09/09/22 1025

## 2022-09-09 NOTE — ED Triage Notes (Signed)
Patient presents to ed c/o left knee pain, states she hit her knee on her desk at work, onset 3 weeks ago and is getting worse,went to [facility] yest and was told  to come to the ED for MRI

## 2022-10-31 LAB — HEMOGLOBIN A1C: Hemoglobin A1C: 15

## 2022-11-16 DIAGNOSIS — Z8041 Family history of malignant neoplasm of ovary: Secondary | ICD-10-CM | POA: Diagnosis not present

## 2022-11-16 DIAGNOSIS — Z1211 Encounter for screening for malignant neoplasm of colon: Secondary | ICD-10-CM | POA: Diagnosis not present

## 2022-11-16 DIAGNOSIS — Z01419 Encounter for gynecological examination (general) (routine) without abnormal findings: Secondary | ICD-10-CM | POA: Diagnosis not present

## 2022-12-07 DIAGNOSIS — E1165 Type 2 diabetes mellitus with hyperglycemia: Secondary | ICD-10-CM | POA: Diagnosis not present

## 2022-12-07 DIAGNOSIS — E782 Mixed hyperlipidemia: Secondary | ICD-10-CM | POA: Diagnosis not present

## 2022-12-07 DIAGNOSIS — I10 Essential (primary) hypertension: Secondary | ICD-10-CM | POA: Diagnosis not present

## 2022-12-07 DIAGNOSIS — R809 Proteinuria, unspecified: Secondary | ICD-10-CM | POA: Diagnosis not present

## 2022-12-07 DIAGNOSIS — Z6841 Body Mass Index (BMI) 40.0 and over, adult: Secondary | ICD-10-CM | POA: Diagnosis not present

## 2022-12-25 ENCOUNTER — Inpatient Hospital Stay: Payer: Self-pay

## 2022-12-25 ENCOUNTER — Inpatient Hospital Stay: Payer: Self-pay | Admitting: Licensed Clinical Social Worker

## 2023-01-29 ENCOUNTER — Encounter: Payer: Self-pay | Admitting: Genetic Counselor

## 2023-01-31 ENCOUNTER — Inpatient Hospital Stay: Payer: Self-pay

## 2023-01-31 ENCOUNTER — Inpatient Hospital Stay: Payer: Self-pay | Admitting: Genetic Counselor

## 2023-02-02 DIAGNOSIS — E782 Mixed hyperlipidemia: Secondary | ICD-10-CM | POA: Diagnosis not present

## 2023-02-02 DIAGNOSIS — D649 Anemia, unspecified: Secondary | ICD-10-CM | POA: Diagnosis not present

## 2023-02-02 DIAGNOSIS — E1165 Type 2 diabetes mellitus with hyperglycemia: Secondary | ICD-10-CM | POA: Diagnosis not present

## 2023-02-02 DIAGNOSIS — R809 Proteinuria, unspecified: Secondary | ICD-10-CM | POA: Diagnosis not present

## 2023-02-02 DIAGNOSIS — E559 Vitamin D deficiency, unspecified: Secondary | ICD-10-CM | POA: Diagnosis not present

## 2023-02-02 DIAGNOSIS — I10 Essential (primary) hypertension: Secondary | ICD-10-CM | POA: Diagnosis not present

## 2023-02-06 LAB — HEMOGLOBIN A1C: Hemoglobin A1C: 9.9

## 2023-02-06 LAB — VITAMIN D 25 HYDROXY (VIT D DEFICIENCY, FRACTURES): Vit D, 25-Hydroxy: 11.1

## 2023-02-06 LAB — MICROALBUMIN / CREATININE URINE RATIO: Microalb Creat Ratio: 265.5

## 2023-02-06 LAB — MICROALBUMIN, URINE: Microalb, Ur: 13.33

## 2023-02-06 LAB — PROTEIN / CREATININE RATIO, URINE: Creatinine, Urine: 50

## 2023-02-06 LAB — COMPREHENSIVE METABOLIC PANEL: eGFR: 85

## 2023-02-07 DIAGNOSIS — I10 Essential (primary) hypertension: Secondary | ICD-10-CM | POA: Diagnosis not present

## 2023-02-07 DIAGNOSIS — E785 Hyperlipidemia, unspecified: Secondary | ICD-10-CM | POA: Diagnosis not present

## 2023-02-07 DIAGNOSIS — R600 Localized edema: Secondary | ICD-10-CM | POA: Diagnosis not present

## 2023-02-07 DIAGNOSIS — Z833 Family history of diabetes mellitus: Secondary | ICD-10-CM | POA: Diagnosis not present

## 2023-02-07 DIAGNOSIS — Z8249 Family history of ischemic heart disease and other diseases of the circulatory system: Secondary | ICD-10-CM | POA: Diagnosis not present

## 2023-02-07 DIAGNOSIS — G8929 Other chronic pain: Secondary | ICD-10-CM | POA: Diagnosis not present

## 2023-02-07 DIAGNOSIS — Z809 Family history of malignant neoplasm, unspecified: Secondary | ICD-10-CM | POA: Diagnosis not present

## 2023-02-07 DIAGNOSIS — Z6841 Body Mass Index (BMI) 40.0 and over, adult: Secondary | ICD-10-CM | POA: Diagnosis not present

## 2023-02-07 DIAGNOSIS — Z794 Long term (current) use of insulin: Secondary | ICD-10-CM | POA: Diagnosis not present

## 2023-02-07 DIAGNOSIS — E119 Type 2 diabetes mellitus without complications: Secondary | ICD-10-CM | POA: Diagnosis not present

## 2023-02-07 DIAGNOSIS — R011 Cardiac murmur, unspecified: Secondary | ICD-10-CM | POA: Diagnosis not present

## 2023-03-13 ENCOUNTER — Inpatient Hospital Stay: Payer: 59

## 2023-03-13 ENCOUNTER — Encounter: Payer: Self-pay | Admitting: Genetic Counselor

## 2023-03-13 ENCOUNTER — Other Ambulatory Visit: Payer: Self-pay | Admitting: Genetic Counselor

## 2023-03-13 ENCOUNTER — Inpatient Hospital Stay: Payer: 59 | Admitting: Genetic Counselor

## 2023-03-13 DIAGNOSIS — Z800 Family history of malignant neoplasm of digestive organs: Secondary | ICD-10-CM

## 2023-03-13 DIAGNOSIS — Z8041 Family history of malignant neoplasm of ovary: Secondary | ICD-10-CM

## 2023-03-13 DIAGNOSIS — Z803 Family history of malignant neoplasm of breast: Secondary | ICD-10-CM | POA: Diagnosis not present

## 2023-03-13 LAB — GENETIC SCREENING ORDER

## 2023-03-13 NOTE — Progress Notes (Signed)
REFERRING PROVIDER: Lavonna Monarch, FNP [address] [place],  [place] [address]  PRIMARY PROVIDER:  Rema Fendt, NP  PRIMARY REASON FOR VISIT:  1. Family history of ovarian cancer   2. Family history of breast cancer   3. Family history of pancreatic cancer      HISTORY OF PRESENT ILLNESS:   [name], a 57 y.o. female, was seen for a [facility] cancer genetics consultation at the request of Dr. Erling Cruz due to a family history of ovarian cancer.  Ms. Hannum presents to clinic today to discuss the possibility of a hereditary predisposition to cancer, genetic testing, and to further clarify her future cancer risks, as well as potential cancer risks for family members.    Martens is a 57 y.o. female with no personal history of cancer.    CANCER HISTORY:  Oncology History   No history exists.     RISK FACTORS:  Menarche was at age 30.  First live birth at age 53.  OCP use for approximately 0 years.  Ovaries intact: yes.  Hysterectomy: yes.  Menopausal status: postmenopausal.  HRT use: 0 years. Colonoscopy: no; not examined. Mammogram within the last year: yes. Number of breast biopsies: 0. Up to date with pelvic exams: n/a. Any excessive radiation exposure in the past: no  Past Medical History:  Diagnosis Date   Anxiety state, unspecified 05/16/2013   Arthritis    "right knee" (08/27/2013)   Asthma    "only when I get a sinus infection which is 1-2 X/yr" (08/27/2013)   Chronic pain syndrome    Diabetes (HCC)    "borderline" (08/27/2013)   Failed back syndrome, cervical    Family history of breast cancer    Family history of ovarian cancer    Family history of pancreatic cancer    GERD (gastroesophageal reflux disease)    Hypertension    no meds   Lumbar radiculopathy, chronic    Migraine    "once or twice/month now" (08/27/2013)   Neck pain    axial, s/p cervical fusion C5-6, C6-7    Obesity    Sickle cell trait (HCC)    Wears glasses      Past Surgical History:  Procedure Laterality Date   ANTERIOR CERVICAL DECOMP/DISCECTOMY FUSION N/A 08/27/2013   Procedure: ANTERIOR CERVICAL /DISCECTOMY FUSION (ACDF) C5-C7  2 LEVELS;  Surgeon: Venita Lick, MD;  Location: [facility] OR;  Service: Orthopedics;  Laterality: N/A;   BREAST REDUCTION SURGERY Bilateral 09/28/2014   Procedure: MAMMARY REDUCTION  (BREAST) BILATERAL;  Surgeon: Louisa Second, MD;  Location: [facility];  Service: Plastics;  Laterality: Bilateral;   CHOLECYSTECTOMY  06/1989   DILATION AND CURETTAGE OF UTERUS     HERNIA REPAIR     KNEE ARTHROSCOPY Left 08/2003   LAPAROSCOPIC BILATERAL SALPINGECTOMY Bilateral 01/22/2015   Procedure: LAPAROSCOPIC BILATERAL SALPINGECTOMY;  Surgeon: Shea Evans, MD;  Location: [facility];  Service: Gynecology;  Laterality: Bilateral;   LEFT HEART CATHETERIZATION WITH CORONARY ANGIOGRAM N/A 04/15/2013   Procedure: LEFT HEART CATHETERIZATION WITH CORONARY ANGIOGRAM;  Surgeon: Peter M Swaziland, MD;  Location: [facility] CATH LAB;  Service: Cardiovascular;  Laterality: N/A;   LIPOSUCTION Bilateral 09/28/2014   Procedure: LIPOSUCTION;  Surgeon: Louisa Second, MD;  Location: [facility];  Service: Plastics;  Laterality: Bilateral;   REDUCTION MAMMAPLASTY  2016   ROBOTIC ASSISTED TOTAL HYSTERECTOMY N/A 01/22/2015   has her ovaries---ROBOTIC ASSISTED TOTAL HYSTERECTOMY With Pelvic Washings, Lysis of Adhesions;  Surgeon:  Shea Evans, MD;  Location: [facility];  Service: Gynecology;  Laterality: N/A;   SHOULDER ARTHROSCOPY W/ ROTATOR CUFF REPAIR Right 2006, 2007   TUBAL LIGATION  1991   UMBILICAL HERNIA REPAIR  2001, 2003   WISDOM TOOTH EXTRACTION  1985    Social History   Socioeconomic History   Marital status: Divorced    Spouse name: Not on file   Number of children: 2   Years of education: Not on file   Highest education level: Not on file  Occupational History   Occupation: works , Sports coach: [employer]   Tobacco Use   Smoking status: Never    Passive exposure: Never   Smokeless tobacco: Never  Vaping Use   Vaping status: Never Used  Substance and Sexual Activity   Alcohol use: No    Alcohol/week: 0.0 standard drinks of alcohol   Drug use: No   Sexual activity: Not Currently    Birth control/protection: None  Other Topics Concern   Not on file  Social History Narrative   Lives w/ her mother   married 05-2017, divorced 2021    2 adult children   Lost her 86 year old daughter March 13, 2020.       Social Determinants of Health   Financial Resource Strain: Not on file  Food Insecurity: Not on file  Transportation Needs: Not on file  Physical Activity: Not on file  Stress: Not on file  Social Connections: Not on file     FAMILY HISTORY:  We obtained a detailed, 4-generation family history.  Significant diagnoses are listed below: Family History  Problem Relation Age of Onset   Diabetes Father        F, B, S   CAD Father        died of MI @ 25   Kidney disease Father    Leukemia Sister 6       AML   Ovarian cancer Sister 22   CAD Sister        MI @ 36, died in sleep   CAD Brother        CHF, died at age 81   Atrial fibrillation Brother 77   Heart attack Brother    Pancreatitis Brother    Atrial fibrillation Brother    Pancreatic cancer Maternal Grandmother 62   Breast cancer Paternal Grandmother        dx > 50   Colon cancer Neg Hx      The patient has two children.  Her daughter died of complications from COVID.  She has two sisters and seven brothers.  One brother died of a heart attack at 71, a second brother died from CHF at 27 and two brothers died of Afib at 78 and 29.  One sister died from AML at 2 that was thought to be from an environmental exposure at work.  The second sister died in her sleep from a 'heart attack' and was found at autopsy to have ovarian cancer.  The patient's father is deceased and mother is living.  The patient's father died  of a heart attack at 40.  He had one brother and one sister who were cancer free.  His mother had breast cancer over age 65 and his father died of a heart attack over 42.  The patient's mother had two sisters.  One sister died at 7 from being hit by a car.  The second sister died but did not have cancer.  The maternal grandmother had pancreatic cancer.  Ms. Killeen is unaware of previous family history of genetic testing for hereditary cancer risks. There is no reported Ashkenazi Jewish ancestry. There is no known consanguinity.  GENETIC COUNSELING ASSESSMENT: Ms. Dibiase is a 57 y.o. female with a family history of cancer which is somewhat suggestive of a hereditary cancer syndrome and predisposition to cancer given the combination of cancer and young age of onset. We, therefore, discussed and recommended the following at today's visit.   DISCUSSION: We discussed that, in general, most cancer is not inherited in families, but instead is sporadic or familial. Sporadic cancers occur by chance and typically happen at older ages (>50 years) as this type of cancer is caused by genetic changes acquired during an individual's lifetime. Some families have more cancers than would be expected by chance; however, the ages or types of cancer are not consistent with a known genetic mutation or known genetic mutations have been ruled out. This type of familial cancer is thought to be due to a combination of multiple genetic, environmental, hormonal, and lifestyle factors. While this combination of factors likely increases the risk of cancer, the exact source of this risk is not currently identifiable or testable.  We discussed that 12 - 13% of cancer is hereditary, with most cases of ovarian cancer associated with BRCA mutations.  There are other genes that can be associated with hereditary ovarian cancer syndromes.  These include BRIP1, RAD51C, RAD51D and Lynch syndrome. Based on her family history of early death due to  heart arrhythmia's, heart attack and/or sudden death in sleep, we discussed a possible referral for genetic testing for cardiology conditions.  We will discuss the family history with Lalla Brothers, MS, CGC and make a referral for cardiology genetics if needed.  We discussed that testing is beneficial for several reasons including knowing how to follow individuals after completing their treatment, identifying whether potential treatment options such as PARP inhibitors would be beneficial, and understand if other family members could be at risk for cancer and allow them to undergo genetic testing.   We reviewed the characteristics, features and inheritance patterns of hereditary cancer syndromes. We also discussed genetic testing, including the appropriate family members to test, the process of testing, insurance coverage and turn-around-time for results. We discussed the implications of a negative, positive, carrier and/or variant of uncertain significant result. Ms. Vanderbush  was offered a common hereditary cancer panel (40+ genes) and an expanded pan-cancer panel (70+ genes). Ms. Harp was informed of the benefits and limitations of each panel, including that expanded pan-cancer panels contain genes that do not have clear management guidelines at this point in time.  We also discussed that as the number of genes included on a panel increases, the chances of variants of uncertain significance increases. Ms. Kissling decided to pursue genetic testing for the Multi-cancer + RNA gene panel.   The Multi-Cancer + RNA Panel offered by [facility] includes sequencing and/or deletion/duplication analysis of the following 70 genes:  AIP*, ALK, APC*, ATM*, AXIN2*, BAP1*, BARD1*, BLM*, BMPR1A*, BRCA1*, BRCA2*, BRIP1*, CDC73*, CDH1*, CDK4, CDKN1B*, CDKN2A, CHEK2*, CTNNA1*, DICER1*, EPCAM (del/dup only), EGFR, FH*, FLCN*, GREM1 (promoter dup only), HOXB13, KIT, LZTR1, MAX*, MBD4, MEN1*, MET, MITF, MLH1*, MSH2*, MSH3*, MSH6*, MUTYH*,  NF1*, NF2*, NTHL1*, PALB2*, PDGFRA, PMS2*, POLD1*, POLE*, POT1*, PRKAR1A*, PTCH1*, PTEN*, RAD51C*, RAD51D*, RB1*, RET, SDHA* (sequencing only), SDHAF2*, SDHB*, SDHC*, SDHD*, SMAD4*, SMARCA4*, SMARCB1*, SMARCE1*, STK11*, SUFU*, TMEM127*, TP53*, TSC1*, TSC2*, VHL*. RNA analysis is performed for *  genes.   Based on Ms. Wehrs family history of cancer, she meets medical criteria for genetic testing. Despite that she meets criteria, she may still have an out of pocket cost. We discussed that if her out of pocket cost for testing is over $100, the laboratory will call and confirm whether she wants to proceed with testing.  If the out of pocket cost of testing is less than $100 she will be billed by the genetic testing laboratory.   We discussed that some people do not want to undergo genetic testing due to fear of genetic discrimination.  The Genetic Information Nondiscrimination Act (GINA) was signed into federal law in 2008. GINA prohibits health insurers and most employers from discriminating against individuals based on genetic information (including the results of genetic tests and family history information). According to GINA, health insurance companies cannot consider genetic information to be a preexisting condition, nor can they use it to make decisions regarding coverage or rates. GINA also makes it illegal for most employers to use genetic information in making decisions about hiring, firing, promotion, or terms of employment. It is important to note that GINA does not offer protections for life insurance, disability insurance, or long-term care insurance. GINA does not apply to those in the [employer], those who work for companies with less than 15 employees, and new life insurance or long-term disability insurance policies.  Health status due to a cancer diagnosis is not protected under GINA. More information about GINA can be found by visiting [email or web address].   PLAN: After considering the risks,  benefits, and limitations, Ms. Sherpa provided informed consent to pursue genetic testing and the blood sample was sent to [facility] for analysis of the Multi-cancer + RNA. Results should be available within approximately 2-3 weeks' time, at which point they will be disclosed by telephone to Ms. Buffin, as will any additional recommendations warranted by these results. Ms. Vorhies will receive a summary of her genetic counseling visit and a copy of her results once available. This information will also be available in Epic.   Lastly, we encouraged Ms. Boll to remain in contact with cancer genetics annually so that we can continuously update the family history and inform her of any changes in cancer genetics and testing that may be of benefit for this family.   Ms. Brazzel questions were answered to her satisfaction today. Our contact information was provided should additional questions or concerns arise. Thank you for the referral and allowing [place] to share in the care of your patient.   Garnetta Fedrick P. Lowell Guitar, MS, [facility] Licensed, Patent attorney [email or web address] phone: [phone]  The patient was seen for a total of 40 minutes in face-to-face genetic counseling.  The patient was seen alone.  Drs. Meliton Rattan, and/or [place] were available for questions, if needed..    _______________________________________________________________________ For Office Staff:  Number of people involved in session: 1 Was an Intern/ student involved with case: no

## 2023-03-27 ENCOUNTER — Telehealth: Payer: Self-pay | Admitting: Genetic Counselor

## 2023-03-27 ENCOUNTER — Encounter: Payer: Self-pay | Admitting: Genetic Counselor

## 2023-03-27 DIAGNOSIS — Z1379 Encounter for other screening for genetic and chromosomal anomalies: Secondary | ICD-10-CM | POA: Insufficient documentation

## 2023-03-27 NOTE — Telephone Encounter (Signed)
Patient is unable to discuss results right now.  Will try to CB later today.

## 2023-03-29 ENCOUNTER — Ambulatory Visit: Payer: Self-pay | Admitting: Genetic Counselor

## 2023-03-29 ENCOUNTER — Telehealth: Payer: Self-pay | Admitting: Genetic Counselor

## 2023-03-29 DIAGNOSIS — Z1379 Encounter for other screening for genetic and chromosomal anomalies: Secondary | ICD-10-CM

## 2023-03-29 NOTE — Telephone Encounter (Signed)
Revealed negative genetic testing.  Discussed that we do not know why there is cancer in the family. It could be due to a different gene that we are not testing, or maybe our current technology may not be able to pick something up.  It will be important for her to keep in contact with genetics to keep up with whether additional testing may be needed.  

## 2023-03-29 NOTE — Progress Notes (Signed)
HPI:  Ms. Caissie was previously seen in the [facility] due to a family history of cancer and concerns regarding a hereditary predisposition to cancer. Please refer to our prior cancer genetics clinic note for more information regarding our discussion, assessment and recommendations, at the time. Ms. Galanos recent genetic test results were disclosed to her, as were recommendations warranted by these results. These results and recommendations are discussed in more detail below.  CANCER HISTORY:  Oncology History   No history exists.    FAMILY HISTORY:  We obtained a detailed, 4-generation family history.  Significant diagnoses are listed below: Family History  Problem Relation Age of Onset   Diabetes Father        F, B, S   CAD Father        died of MI @ 38   Kidney disease Father    Leukemia Sister 13       AML   Ovarian cancer Sister 20   CAD Sister        MI @ 53, died in sleep   CAD Brother        CHF, died at age 34   Atrial fibrillation Brother 56   Heart attack Brother    Pancreatitis Brother    Atrial fibrillation Brother    Pancreatic cancer Maternal Grandmother 96   Breast cancer Paternal Grandmother        dx > 50   Colon cancer Neg Hx        The patient has two children.  Her daughter died of complications from COVID.  She has two sisters and seven brothers.  One brother died of a heart attack at 90, a second brother died from CHF at 37 and two brothers died of Afib at 65 and 41.  One sister died from AML at 24 that was thought to be from an environmental exposure at work.  The second sister died in her sleep from a 'heart attack' and was found at autopsy to have ovarian cancer.  The patient's father is deceased and mother is living.   The patient's father died of a heart attack at 37.  He had one brother and one sister who were cancer free.  His mother had breast cancer over age 97 and his father died of a heart attack over 68.   The patient's  mother had two sisters.  One sister died at 7 from being hit by a car.  The second sister died but did not have cancer.  The maternal grandmother had pancreatic cancer.   Ms. Meggison is unaware of previous family history of genetic testing for hereditary cancer risks. There is no reported Ashkenazi Jewish ancestry. There is no known consanguinity  GENETIC TEST RESULTS: Genetic testing reported out on March 24, 2023 through the Multi-Cancer + RNA cancer panel found no pathogenic mutations. The Multi-Cancer + RNA Panel offered by [facility] includes sequencing and/or deletion/duplication analysis of the following 70 genes:  AIP*, ALK, APC*, ATM*, AXIN2*, BAP1*, BARD1*, BLM*, BMPR1A*, BRCA1*, BRCA2*, BRIP1*, CDC73*, CDH1*, CDK4, CDKN1B*, CDKN2A, CHEK2*, CTNNA1*, DICER1*, EPCAM (del/dup only), EGFR, FH*, FLCN*, GREM1 (promoter dup only), HOXB13, KIT, LZTR1, MAX*, MBD4, MEN1*, MET, MITF, MLH1*, MSH2*, MSH3*, MSH6*, MUTYH*, NF1*, NF2*, NTHL1*, PALB2*, PDGFRA, PMS2*, POLD1*, POLE*, POT1*, PRKAR1A*, PTCH1*, PTEN*, RAD51C*, RAD51D*, RB1*, RET, SDHA* (sequencing only), SDHAF2*, SDHB*, SDHC*, SDHD*, SMAD4*, SMARCA4*, SMARCB1*, SMARCE1*, STK11*, SUFU*, TMEM127*, TP53*, TSC1*, TSC2*, VHL*. RNA analysis is performed for * genes. The test report has  been scanned into EPIC and is located under the Molecular Pathology section of the Results Review tab.  A portion of the result report is included below for reference.     We discussed with Ms. Eoff that because current genetic testing is not perfect, it is possible there may be a gene mutation in one of these genes that current testing cannot detect, but that chance is small.  We also discussed, that there could be another gene that has not yet been discovered, or that we have not yet tested, that is responsible for the cancer diagnoses in the family. It is also possible there is a hereditary cause for the cancer in the family that Ms. Ciocca did not inherit and therefore was  not identified in her testing.  Therefore, it is important to remain in touch with cancer genetics in the future so that we can continue to offer Ms. Duncil the most up to date genetic testing.   ADDITIONAL GENETIC TESTING: We discussed with Ms. Arispe that her genetic testing was fairly extensive.  If there are genes identified to increase cancer risk that can be analyzed in the future, we would be happy to discuss and coordinate this testing at that time.    CANCER SCREENING RECOMMENDATIONS: Ms. Chapnick test result is considered negative (normal).  This means that we have not identified a hereditary cause for her family history of cancer at this time. Most cancers happen by chance and this negative test suggests that her family history of cancer may fall into this category.    Possible reasons for Ms. Farooqui's negative genetic test include:  1. There may be a gene mutation in one of these genes that current testing methods cannot detect but that chance is small.  2. There could be another gene that has not yet been discovered, or that we have not yet tested, that is responsible for the cancer diagnoses in the family.  3.  There may be no hereditary risk for cancer in the family. The cancers in Ms. Swanigan and/or her family may be sporadic/familial or due to other genetic and environmental factors. 4. It is also possible there is a hereditary cause for the cancer in the family that Ms. Butch did not inherit.  Therefore, it is recommended she continue to follow the cancer management and screening guidelines provided by her primary healthcare provider. An individual's cancer risk and medical management are not determined by genetic test results alone. Overall cancer risk assessment incorporates additional factors, including personal medical history, family history, and any available genetic information that may result in a personalized plan for cancer prevention and surveillance  RECOMMENDATIONS FOR FAMILY  MEMBERS:  Individuals in this family might be at some increased risk of developing cancer, over the general population risk, simply due to the family history of cancer.  We recommended women in this family have a yearly mammogram beginning at age 39, or 62 years younger than the earliest onset of cancer, an annual clinical breast exam, and perform monthly breast self-exams. Women in this family should also have a gynecological exam as recommended by their primary provider. All family members should be referred for colonoscopy starting at age 57.  FOLLOW-UP: Lastly, we discussed with Ms. Deslauriers that cancer genetics is a rapidly advancing field and it is possible that new genetic tests will be appropriate for her and/or her family members in the future. We encouraged her to remain in contact with cancer genetics on an annual basis so  we can update her personal and family histories and let her know of advances in cancer genetics that may benefit this family.   Our contact number was provided. Ms. Kata questions were answered to her satisfaction, and she knows she is welcome to call us at anytime with additional questions or concerns.   Maylon Cos, MS, [facility] Licensed, Certified Genetic Counselor [email or web address]

## 2023-04-10 ENCOUNTER — Ambulatory Visit: Payer: 59 | Admitting: Physician Assistant

## 2023-04-10 ENCOUNTER — Other Ambulatory Visit (INDEPENDENT_AMBULATORY_CARE_PROVIDER_SITE_OTHER): Payer: Self-pay

## 2023-04-10 ENCOUNTER — Encounter: Payer: Self-pay | Admitting: Physician Assistant

## 2023-04-10 DIAGNOSIS — G8929 Other chronic pain: Secondary | ICD-10-CM | POA: Diagnosis not present

## 2023-04-10 DIAGNOSIS — M1712 Unilateral primary osteoarthritis, left knee: Secondary | ICD-10-CM | POA: Insufficient documentation

## 2023-04-10 DIAGNOSIS — M25562 Pain in left knee: Secondary | ICD-10-CM

## 2023-04-10 MED ORDER — MELOXICAM 7.5 MG PO TABS: 7.5000 mg | ORAL_TABLET | Freq: Every day | ORAL | 0 refills | Status: AC

## 2023-04-10 NOTE — Progress Notes (Signed)
Office Visit Note   Patient: [name]           Date of Birth: 1966-01-22           MRN: [id number] Visit Date: 04/10/2023              Requested by: Rema Fendt, NP [address] [place],  [place] [address] PCP: Rema Fendt, NP   Assessment & Plan: Visit Diagnoses:  1. Chronic pain of left knee   2. Unilateral primary osteoarthritis, left knee     Plan: [name] is a pleasant 57 year old woman who has a history of left knee pain.  She has had some difficulty with both of her knees in the past.  She is status post left knee arthroscopy in 2005.  She said she was getting in or out of a car today and her knee buckled on the left and she fell onto her left knee.  Denies any other injuries..  I have recommended observation and trying some anti-inflammatories she knows not to take anything in addition to this she can tolerate anti-inflammatories.  Use ice can get a cane for balance as needed.  Cannot consider an injection today because her hemoglobin A1c is well over 9 and almost 10.  Will reevaluate her in 1 week  Follow-Up Instructions: Return in about 1 week (around 04/17/2023).   Orders:  Orders Placed This Encounter  Procedures   XR KNEE 3 VIEW LEFT   Meds ordered this encounter  Medications   meloxicam (MOBIC) 7.5 MG tablet    Sig: Take 1 tablet (7.5 mg total) by mouth daily.    Dispense:  30 tablet    Refill:  0      Procedures: No procedures performed   Clinical Data: No additional findings.   Subjective: Chief Complaint  Patient presents with   Left Knee - Pain    HPI patient is a pleasant 57 year old woman with a 1 day history of left knee pain.  She does have a history of surgery on this knee in 2005 as well as arthritis in this knee.  She said she was getting in or out of a car today and her knee buckled and she fell onto her left knee describes her pain is moderate global around the knee  Review of Systems  All other systems reviewed  and are negative.    Objective: Vital Signs: LMP 01/16/2015   Physical Exam Constitutional:      Appearance: Normal appearance.  Pulmonary:     Effort: Pulmonary effort is normal.  Skin:    General: Skin is warm and dry.  Neurological:     General: No focal deficit present.     Mental Status: She is alert and oriented to person, place, and time.     Ortho Exam Examination of the left knee she has no crepitus she does have quite a bit of global pain but no abrasions or lacerations compartments are soft and compressible neurovascular intact she can extend her knee without difficulty.  Good varus valgus stability Sp it ecialty Comments:  No specialty comments available.  Imaging: XR KNEE 3 VIEW LEFT  Result Date: 04/10/2023 Three-view radiographs of her left knee demonstrate tricompartmental arthritis with periarticular osteophytes joint space narrowing and sclerotic changes.  Cannot appreciate any acute fractures    PMFS History: Patient Active Problem List   Diagnosis Date Noted   Unilateral primary osteoarthritis, left knee 04/10/2023   Genetic testing 03/27/2023  Family history of ovarian cancer    Family history of breast cancer    Family history of pancreatic cancer    Unilateral primary osteoarthritis, right knee 03/02/2017   Impingement syndrome of left shoulder 06/13/2016   Sacroiliac dysfunction 03/28/2016   Dyslipidemia 10/18/2015   PCP NOTES >>>>>>>>>>> 04/01/2015   S/P laparoscopic hysterectomy 01/22/2015   Migraine with aura and without status migrainosus, not intractable 01/20/2015   Chronic low back pain 01/18/2015   Postlaminectomy syndrome, cervical region 01/18/2015   Lumbar facet arthropathy 01/18/2015   Lumbar degenerative disc disease 01/18/2015   Anxiety state 05/16/2013   Morbid obesity (HCC) 04/15/2013   Diabetes (HCC) 02/14/2013   Mild anemia 02/14/2013   HTN (hypertension) 12/12/2012   Annual physical exam 12/12/2012   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   GERD (gastroesophageal reflux disease)    Past Medical History:  Diagnosis Date   Anxiety state, unspecified 05/16/2013   Arthritis    "right knee" (08/27/2013)   Asthma    "only when I get a sinus infection which is 1-2 X/yr" (08/27/2013)   Chronic pain syndrome    Diabetes (HCC)    "borderline" (08/27/2013)   Failed back syndrome, cervical    Family history of breast cancer    Family history of ovarian cancer    Family history of pancreatic cancer    GERD (gastroesophageal reflux disease)    Hypertension    no meds   Lumbar radiculopathy, chronic    Migraine    "once or twice/month now" (08/27/2013)   Neck pain    axial, s/p cervical fusion C5-6, C6-7    Obesity    Sickle cell trait (HCC)    Wears glasses     Family History  Problem Relation Age of Onset   Diabetes Father        F, B, S   CAD Father        died of MI @ 50   Kidney disease Father    Leukemia Sister 40       AML   Ovarian cancer Sister 91   CAD Sister        MI @ 60, died in sleep   CAD Brother        CHF, died at age 54   Atrial fibrillation Brother 49   Heart attack Brother    Pancreatitis Brother    Atrial fibrillation Brother    Pancreatic cancer Maternal Grandmother 31   Breast cancer Paternal Grandmother        dx > 50   Colon cancer Neg Hx     Past Surgical History:  Procedure Laterality Date   ANTERIOR CERVICAL DECOMP/DISCECTOMY FUSION N/A 08/27/2013   Procedure: ANTERIOR CERVICAL /DISCECTOMY FUSION (ACDF) C5-C7  2 LEVELS;  Surgeon: Venita Lick, MD;  Location: [facility];  Service: Orthopedics;  Laterality: N/A;   BREAST REDUCTION SURGERY Bilateral 09/28/2014   Procedure: MAMMARY REDUCTION  (BREAST) BILATERAL;  Surgeon: Louisa Second, MD;  Location: [facility];  Service: Plastics;  Laterality: Bilateral;   CHOLECYSTECTOMY  06/1989   DILATION AND CURETTAGE OF UTERUS     HERNIA REPAIR     KNEE ARTHROSCOPY Left 08/2003   LAPAROSCOPIC BILATERAL SALPINGECTOMY  Bilateral 01/22/2015   Procedure: LAPAROSCOPIC BILATERAL SALPINGECTOMY;  Surgeon: Shea Evans, MD;  Location: [facility];  Service: Gynecology;  Laterality: Bilateral;   LEFT HEART CATHETERIZATION WITH CORONARY ANGIOGRAM N/A 04/15/2013   Procedure: LEFT HEART CATHETERIZATION WITH CORONARY ANGIOGRAM;  Surgeon: Peter M Swaziland, MD;  Location: [facility];  Service: Cardiovascular;  Laterality: N/A;   LIPOSUCTION Bilateral 09/28/2014   Procedure: LIPOSUCTION;  Surgeon: Louisa Second, MD;  Location: [facility];  Service: Plastics;  Laterality: Bilateral;   REDUCTION MAMMAPLASTY  2016   ROBOTIC ASSISTED TOTAL HYSTERECTOMY N/A 01/22/2015   has her ovaries---ROBOTIC ASSISTED TOTAL HYSTERECTOMY With Pelvic Washings, Lysis of Adhesions;  Surgeon: Shea Evans, MD;  Location: [facility];  Service: Gynecology;  Laterality: N/A;   SHOULDER ARTHROSCOPY W/ ROTATOR CUFF REPAIR Right 2006, 2007   TUBAL LIGATION  1991   UMBILICAL HERNIA REPAIR  2001, 2003   WISDOM TOOTH EXTRACTION  1985   Social History   Occupational History   Occupation: works , Sports coach: [employer]  Tobacco Use   Smoking status: Never    Passive exposure: Never   Smokeless tobacco: Never  Vaping Use   Vaping status: Never Used  Substance and Sexual Activity   Alcohol use: No    Alcohol/week: 0.0 standard drinks of alcohol   Drug use: No   Sexual activity: Not Currently    Birth control/protection: None

## 2023-04-17 ENCOUNTER — Ambulatory Visit: Payer: 59 | Admitting: Physician Assistant

## 2023-07-09 ENCOUNTER — Encounter: Payer: Self-pay | Admitting: Family

## 2023-07-09 ENCOUNTER — Encounter: Payer: 59 | Admitting: Family

## 2023-07-09 NOTE — Progress Notes (Signed)
Erroneous encounter-disregard

## 2023-07-10 DIAGNOSIS — E1165 Type 2 diabetes mellitus with hyperglycemia: Secondary | ICD-10-CM | POA: Diagnosis not present

## 2023-07-12 LAB — HEMOGLOBIN A1C: Hemoglobin A1C: 6.8

## 2023-07-12 LAB — COMPREHENSIVE METABOLIC PANEL: eGFR: 87

## 2023-07-12 LAB — VITAMIN D 25 HYDROXY (VIT D DEFICIENCY, FRACTURES): Vit D, 25-Hydroxy: 21.4

## 2023-08-07 ENCOUNTER — Ambulatory Visit: Payer: 59 | Admitting: Pharmacist

## 2023-08-21 ENCOUNTER — Ambulatory Visit: Payer: 59 | Admitting: Pharmacist

## 2023-08-21 ENCOUNTER — Encounter: Payer: Self-pay | Admitting: *Deleted

## 2023-08-21 ENCOUNTER — Encounter: Payer: Self-pay | Admitting: Family

## 2023-10-11 ENCOUNTER — Telehealth: Payer: Self-pay | Admitting: Physician Assistant

## 2023-10-11 NOTE — Telephone Encounter (Signed)
 Copied from CRM [id number]. Topic: Appointments - Appointment Scheduling >> Oct 11, 2023  9:40 AM Emylou G wrote: Pls call patient [phone].Marland Kitchen looking for sooner appt before June.. 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

## 2023-10-23 NOTE — Telephone Encounter (Signed)
 Rescheduled appt for 05/16. Called pt and could not reach or leave vm due to full vm box.

## 2023-11-16 ENCOUNTER — Encounter (HOSPITAL_COMMUNITY): Payer: Self-pay

## 2023-11-22 ENCOUNTER — Encounter: Admitting: Family Medicine

## 2023-11-23 ENCOUNTER — Encounter: Admitting: Family

## 2023-11-28 ENCOUNTER — Encounter: Payer: Self-pay | Admitting: Family

## 2023-11-28 NOTE — Progress Notes (Signed)
 Erroneous encounter-disregard

## 2023-12-12 ENCOUNTER — Encounter: Admitting: Family
